# Patient Record
Sex: Female | Born: 1935 | Race: White | Hispanic: No | Smoking: Former smoker
Health system: Southern US, Community
[De-identification: ages and names within clinical notes are randomized; demographics above are authoritative.]

## PROBLEM LIST (undated history)

## (undated) DIAGNOSIS — W19XXXA Unspecified fall, initial encounter: Secondary | ICD-10-CM

## (undated) DIAGNOSIS — I10 Essential (primary) hypertension: Secondary | ICD-10-CM

## (undated) DIAGNOSIS — F329 Major depressive disorder, single episode, unspecified: Secondary | ICD-10-CM

## (undated) DIAGNOSIS — Z9229 Personal history of other drug therapy: Secondary | ICD-10-CM

## (undated) DIAGNOSIS — R42 Dizziness and giddiness: Secondary | ICD-10-CM

## (undated) DIAGNOSIS — I2510 Atherosclerotic heart disease of native coronary artery without angina pectoris: Secondary | ICD-10-CM

## (undated) DIAGNOSIS — S72011A Unspecified intracapsular fracture of right femur, initial encounter for closed fracture: Secondary | ICD-10-CM

## (undated) DIAGNOSIS — F419 Anxiety disorder, unspecified: Secondary | ICD-10-CM

## (undated) DIAGNOSIS — F32A Depression, unspecified: Secondary | ICD-10-CM

## (undated) DIAGNOSIS — M1990 Unspecified osteoarthritis, unspecified site: Secondary | ICD-10-CM

## (undated) DIAGNOSIS — I495 Sick sinus syndrome: Secondary | ICD-10-CM

## (undated) DIAGNOSIS — I4821 Permanent atrial fibrillation: Secondary | ICD-10-CM

## (undated) DIAGNOSIS — F0390 Unspecified dementia without behavioral disturbance: Secondary | ICD-10-CM

## (undated) DIAGNOSIS — K219 Gastro-esophageal reflux disease without esophagitis: Secondary | ICD-10-CM

## (undated) HISTORY — DX: Personal history of other drug therapy: Z92.29

## (undated) HISTORY — DX: Unspecified fall, initial encounter: W19.XXXA

## (undated) HISTORY — DX: Anxiety disorder, unspecified: F41.9

## (undated) HISTORY — PX: CHOLECYSTECTOMY: SHX55

## (undated) HISTORY — DX: Unspecified osteoarthritis, unspecified site: M19.90

## (undated) HISTORY — DX: Dizziness and giddiness: R42

## (undated) HISTORY — DX: Permanent atrial fibrillation: I48.21

## (undated) HISTORY — DX: Gastro-esophageal reflux disease without esophagitis: K21.9

## (undated) HISTORY — DX: Unspecified intracapsular fracture of right femur, initial encounter for closed fracture: S72.011A

## (undated) HISTORY — DX: Depression, unspecified: F32.A

## (undated) HISTORY — DX: Major depressive disorder, single episode, unspecified: F32.9

## (undated) HISTORY — PX: GASTRIC BYPASS: SHX52

## (undated) HISTORY — PX: PACEMAKER INSERTION: SHX728

## (undated) HISTORY — PX: BREAST REDUCTION SURGERY: SHX8

## (undated) HISTORY — DX: Atherosclerotic heart disease of native coronary artery without angina pectoris: I25.10

## (undated) HISTORY — PX: TOTAL KNEE ARTHROPLASTY: SHX125

## (undated) HISTORY — DX: Essential (primary) hypertension: I10

## (undated) HISTORY — DX: Sick sinus syndrome: I49.5

---

## 1998-02-10 ENCOUNTER — Other Ambulatory Visit: Admission: RE | Admit: 1998-02-10 | Discharge: 1998-02-10 | Payer: Self-pay | Admitting: Internal Medicine

## 1998-08-20 ENCOUNTER — Other Ambulatory Visit: Admission: RE | Admit: 1998-08-20 | Discharge: 1998-08-20 | Payer: Self-pay | Admitting: Internal Medicine

## 1999-04-22 ENCOUNTER — Other Ambulatory Visit: Admission: RE | Admit: 1999-04-22 | Discharge: 1999-04-22 | Payer: Self-pay | Admitting: Obstetrics and Gynecology

## 2000-09-06 ENCOUNTER — Encounter: Payer: Self-pay | Admitting: Internal Medicine

## 2000-09-06 ENCOUNTER — Encounter: Admission: RE | Admit: 2000-09-06 | Discharge: 2000-09-06 | Payer: Self-pay | Admitting: Internal Medicine

## 2001-12-26 ENCOUNTER — Encounter: Payer: Self-pay | Admitting: Internal Medicine

## 2001-12-26 ENCOUNTER — Encounter: Admission: RE | Admit: 2001-12-26 | Discharge: 2001-12-26 | Payer: Self-pay | Admitting: Internal Medicine

## 2004-08-18 ENCOUNTER — Encounter: Admission: RE | Admit: 2004-08-18 | Discharge: 2004-08-18 | Payer: Self-pay | Admitting: Internal Medicine

## 2005-01-12 ENCOUNTER — Encounter: Admission: RE | Admit: 2005-01-12 | Discharge: 2005-01-12 | Payer: Self-pay | Admitting: Internal Medicine

## 2005-02-23 ENCOUNTER — Ambulatory Visit (HOSPITAL_COMMUNITY): Admission: RE | Admit: 2005-02-23 | Discharge: 2005-02-23 | Payer: Self-pay | Admitting: General Surgery

## 2005-04-19 ENCOUNTER — Ambulatory Visit (HOSPITAL_COMMUNITY): Admission: RE | Admit: 2005-04-19 | Discharge: 2005-04-20 | Payer: Self-pay | Admitting: General Surgery

## 2005-04-19 ENCOUNTER — Encounter (INDEPENDENT_AMBULATORY_CARE_PROVIDER_SITE_OTHER): Payer: Self-pay | Admitting: *Deleted

## 2005-11-09 ENCOUNTER — Encounter: Admission: RE | Admit: 2005-11-09 | Discharge: 2005-11-09 | Payer: Self-pay | Admitting: Internal Medicine

## 2007-01-12 HISTORY — PX: US ECHOCARDIOGRAPHY: HXRAD669

## 2007-02-15 ENCOUNTER — Inpatient Hospital Stay (HOSPITAL_COMMUNITY): Admission: AD | Admit: 2007-02-15 | Discharge: 2007-02-16 | Payer: Self-pay | Admitting: Cardiology

## 2007-02-15 HISTORY — PX: CARDIOVASCULAR STRESS TEST: SHX262

## 2007-04-27 ENCOUNTER — Ambulatory Visit (HOSPITAL_COMMUNITY): Admission: RE | Admit: 2007-04-27 | Discharge: 2007-04-27 | Payer: Self-pay | Admitting: Cardiology

## 2007-04-30 ENCOUNTER — Inpatient Hospital Stay (HOSPITAL_COMMUNITY): Admission: AD | Admit: 2007-04-30 | Discharge: 2007-05-02 | Payer: Self-pay | Admitting: Cardiology

## 2007-05-10 ENCOUNTER — Encounter: Admission: RE | Admit: 2007-05-10 | Discharge: 2007-05-10 | Payer: Self-pay | Admitting: Cardiology

## 2007-07-18 ENCOUNTER — Encounter: Admission: RE | Admit: 2007-07-18 | Discharge: 2007-07-18 | Payer: Self-pay | Admitting: Internal Medicine

## 2007-07-20 ENCOUNTER — Ambulatory Visit (HOSPITAL_COMMUNITY): Admission: RE | Admit: 2007-07-20 | Discharge: 2007-07-20 | Payer: Self-pay | Admitting: Cardiology

## 2007-08-03 ENCOUNTER — Encounter (INDEPENDENT_AMBULATORY_CARE_PROVIDER_SITE_OTHER): Payer: Self-pay | Admitting: Cardiology

## 2007-08-03 ENCOUNTER — Ambulatory Visit (HOSPITAL_COMMUNITY): Admission: RE | Admit: 2007-08-03 | Discharge: 2007-08-03 | Payer: Self-pay | Admitting: Cardiology

## 2007-08-03 ENCOUNTER — Ambulatory Visit: Payer: Self-pay | Admitting: Vascular Surgery

## 2007-08-10 HISTORY — PX: US ECHOCARDIOGRAPHY: HXRAD669

## 2008-01-10 ENCOUNTER — Ambulatory Visit: Admission: RE | Admit: 2008-01-10 | Discharge: 2008-01-10 | Payer: Self-pay | Admitting: Cardiology

## 2008-01-24 ENCOUNTER — Ambulatory Visit (HOSPITAL_COMMUNITY): Admission: RE | Admit: 2008-01-24 | Discharge: 2008-01-25 | Payer: Self-pay | Admitting: *Deleted

## 2008-01-24 HISTORY — PX: INSERT / REPLACE / REMOVE PACEMAKER: SUR710

## 2009-04-06 ENCOUNTER — Inpatient Hospital Stay (HOSPITAL_COMMUNITY): Admission: RE | Admit: 2009-04-06 | Discharge: 2009-04-10 | Payer: Self-pay | Admitting: Orthopedic Surgery

## 2010-07-01 ENCOUNTER — Ambulatory Visit: Payer: Self-pay | Admitting: Cardiology

## 2010-07-29 ENCOUNTER — Ambulatory Visit: Payer: Self-pay | Admitting: Cardiology

## 2010-08-13 ENCOUNTER — Ambulatory Visit: Payer: Self-pay | Admitting: Cardiology

## 2010-08-28 ENCOUNTER — Encounter: Payer: Self-pay | Admitting: Internal Medicine

## 2010-09-08 ENCOUNTER — Ambulatory Visit: Payer: Self-pay | Admitting: Internal Medicine

## 2010-09-10 ENCOUNTER — Ambulatory Visit: Payer: Self-pay | Admitting: Cardiology

## 2010-10-15 ENCOUNTER — Ambulatory Visit: Payer: Self-pay | Admitting: Cardiology

## 2010-10-15 ENCOUNTER — Ambulatory Visit: Payer: Self-pay | Admitting: Cardiovascular Disease

## 2010-11-04 ENCOUNTER — Ambulatory Visit: Payer: Self-pay | Admitting: Cardiology

## 2010-12-06 ENCOUNTER — Ambulatory Visit: Payer: Self-pay | Admitting: Cardiology

## 2010-12-09 ENCOUNTER — Ambulatory Visit: Admit: 2010-12-09 | Payer: Self-pay | Admitting: Internal Medicine

## 2010-12-20 ENCOUNTER — Ambulatory Visit: Payer: Self-pay | Admitting: Cardiology

## 2010-12-23 NOTE — Procedures (Signed)
Summary: pacer check/medtronic   Current Medications (verified): 1)  Klor-Con 10 10 Meq Cr-Tabs (Potassium Chloride) .... One By Mouth Daily 2)  Atenolol 50 Mg Tabs (Atenolol) .... One By Mouth Daily 3)  Triamterene-Hctz 37.5-25 Mg Tabs (Triamterene-Hctz) .... One By Mouth Daily 4)  Omeprazole 20 Mg Cpdr (Omeprazole) .... Two By Mouth Daily 5)  Amiodarone Hcl 200 Mg Tabs (Amiodarone Hcl) .... One By Mouth Daily 6)  Coumadin 2.5 Mg Tabs (Warfarin Sodium) .... As Directed 7)  Alendronate Sodium 70 Mg Tabs (Alendronate Sodium) .... One By Mouth Weekly 8)  Tylenol Arthritis Pain 650 Mg Cr-Tabs (Acetaminophen) .... As Needed 9)  Citracal Plus  Tabs (Multiple Minerals-Vitamins) .... Two By Mouth Daily 10)  Ra Col-Rite 100 Mg Caps (Docusate Sodium) .... One By Mouth Two Times A Day 11)  Vitamin D3 5000 Unit Tabs (Cholecalciferol) .... One By Mouth Monthly 12)  Xalatan 0.005 % Soln (Latanoprost) .Marland Kitchen.. 1 Drop Each Eye Daily 13)  Optivar 0.05 % Soln (Azelastine Hcl) .Marland Kitchen.. 1 Drop Each Eye Two Times A Day  Allergies (verified): 1)  ! Codeine 2)  ! Iodine  PPM Specifications Following MD:  Hillis Range, MD     Referring MD:  Peter [place]azil[place], MD PPM Vendor:  Medtronic     PPM Model Number:  [id number]     PPM Serial Number:  [id number] PPM DOI:  01/24/2008     PPM Implanting MD:  Charlynn Court  Lead 1    Location: RA     DOI: 01/24/2008     Model #: [id number]     Serial #: [id number]     Status: active Lead 2    Location: RV     DOI: 01/24/2008     Model #: [id number]     Serial #: [id number]     Status: active  Magnet Response Rate:  BOL 85 ERI 65  Indications:  A-fib   PPM Follow Up Remote Check?  No Battery Voltage:  3.02 V     Pacer Dependent:  No       PPM Device Measurements Atrium  Amplitude: 1.8 mV, Impedance: 384 ohms, Threshold: 0.5 V at 0.4 msec Right Ventricle  Amplitude: 4.9 mV, Impedance: 440 ohms, Threshold: 1.0 V at 0.4 msec  Episodes MS Episodes:  9     Percent Mode Switch:  5.6%      Coumadin:  Yes Atrial Pacing:  83%     Ventricular Pacing:  1.7%  Parameters Mode:  DDDR+     Lower Rate Limit:  60     Upper Rate Limit:  130 Paced AV Delay:  180     Sensed AV Delay:  150 Next Cardiology Appt Due:  11/21/2010 Tech Comments:  No parameter changes.  Device function normal.  ROV 3 months with Dr. Johney Frame. Altha Harm, LPN  September 08, 2010 4:31 PM

## 2010-12-23 NOTE — Cardiovascular Report (Signed)
Summary: Office Visit   Office Visit   Imported By: Roderic Ovens 09/14/2010 10:39:21  _____________________________________________________________________  External Attachment:    Type:   Image     Comment:   External Document

## 2010-12-23 NOTE — Miscellaneous (Signed)
Summary: Device preload  Clinical Lists Changes  Observations: Added new observation of PPM INDICATN: A-fib (08/28/2010 12:44) Added new observation of MAGNET RTE: BOL 85 ERI 65 (08/28/2010 12:44) Added new observation of PPMLEADSTAT2: active (08/28/2010 12:44) Added new observation of PPMLEADSER2: [id number] (08/28/2010 12:44) Added new observation of PPMLEADMOD2: 5076  (08/28/2010 12:44) Added new observation of PPMLEADDOI2: 01/24/2008  (08/28/2010 12:44) Added new observation of PPMLEADLOC2: RV  (08/28/2010 12:44) Added new observation of PPMLEADSTAT1: active  (08/28/2010 12:44) Added new observation of PPMLEADSER1: [id number]  (08/28/2010 12:44) Added new observation of PPMLEADMOD1: 5076  (08/28/2010 12:44) Added new observation of PPMLEADDOI1: 01/24/2008  (08/28/2010 12:44) Added new observation of PPMLEADLOC1: RA  (08/28/2010 12:44) Added new observation of PPM IMP MD: Charlynn Court  (08/28/2010 12:44) Added new observation of PPM DOI: 01/24/2008  (08/28/2010 12:44) Added new observation of PPM SERL#: [id number]  (08/28/2010 12:44) Added new observation of PPM MODL#: [id number]  (08/28/2010 12:44) Added new observation of PACEMAKERMFG: Medtronic  (08/28/2010 12:44) Added new observation of PPM REFER MD: Peter Swaziland, MD  (08/28/2010 12:44) Added new observation of PACEMAKER MD: Hillis Range, MD  (08/28/2010 12:44)      PPM Specifications Following MD:  Hillis Range, MD     Referring MD:  Peter [place]azil[place], MD PPM Vendor:  Medtronic     PPM Model Number:  [id number]     PPM Serial Number:  [id number] PPM DOI:  01/24/2008     PPM Implanting MD:  Charlynn Court  Lead 1    Location: RA     DOI: 01/24/2008     Model #: [id number]     Serial #: [id number]     Status: active Lead 2    Location: RV     DOI: 01/24/2008     Model #: [id number]     Serial #: [id number]     Status: active  Magnet Response Rate:  BOL 85 ERI 65  Indications:  A-fib

## 2011-01-12 ENCOUNTER — Other Ambulatory Visit (INDEPENDENT_AMBULATORY_CARE_PROVIDER_SITE_OTHER): Payer: Medicare Other

## 2011-01-12 DIAGNOSIS — Z7901 Long term (current) use of anticoagulants: Secondary | ICD-10-CM

## 2011-01-12 DIAGNOSIS — I4891 Unspecified atrial fibrillation: Secondary | ICD-10-CM

## 2011-02-02 ENCOUNTER — Encounter (INDEPENDENT_AMBULATORY_CARE_PROVIDER_SITE_OTHER): Payer: Medicare Other | Admitting: Internal Medicine

## 2011-02-02 ENCOUNTER — Encounter: Payer: Self-pay | Admitting: Internal Medicine

## 2011-02-02 DIAGNOSIS — I495 Sick sinus syndrome: Secondary | ICD-10-CM

## 2011-02-02 DIAGNOSIS — I4891 Unspecified atrial fibrillation: Secondary | ICD-10-CM

## 2011-02-02 DIAGNOSIS — I10 Essential (primary) hypertension: Secondary | ICD-10-CM | POA: Insufficient documentation

## 2011-02-08 NOTE — Assessment & Plan Note (Signed)
Summary: pc2/per pt call=mj   Visit Type:  Follow-up Referring Provider:  Dr [place]az[place]and Primary Provider:  Dr Wylene Simmer   History of Present Illness: [name] is a pleasant 77 yo WF with a h/o paroxysmal atrial fibrillation and tachy/ brady syndrome s/p PPM (MDT) by Dr Reyes Ivan 01/24/08 who presents today to establish care in the EP device clinic.  She reports doing very well since her pacemaker was implanted.  She remains active despite her age.  She reports occasional dizziness but denies symptoms of palpitations, chest pain, shortness of breath, orthopnea, PND, lower extremity edema,  presyncope, syncope, or neurologic sequela. The patient is tolerating medications without difficulties and is otherwise without complaint today.   Current Medications (verified): 1)  Klor-Con 10 10 Meq Cr-Tabs (Potassium Chloride) .... One By Mouth Daily 2)  Atenolol 50 Mg Tabs (Atenolol) .... One By Mouth Daily 3)  Triamterene-Hctz 37.5-25 Mg Tabs (Triamterene-Hctz) .... One By Mouth Daily 4)  Omeprazole 20 Mg Cpdr (Omeprazole) .... Two By Mouth Daily 5)  Amiodarone Hcl 200 Mg Tabs (Amiodarone Hcl) .... One By Mouth Daily 6)  Coumadin 2.5 Mg Tabs (Warfarin Sodium) .... As Directed 7)  Alendronate Sodium 70 Mg Tabs (Alendronate Sodium) .... One By Mouth Weekly 8)  Tylenol Arthritis Pain 650 Mg Cr-Tabs (Acetaminophen) .... As Needed 9)  Citracal Plus  Tabs (Multiple Minerals-Vitamins) .... Two By Mouth Daily 10)  Ra Col-Rite 100 Mg Caps (Docusate Sodium) .... One By Mouth Two Times A Day 11)  Vitamin D (Ergocalciferol) 50000 Unit Caps (Ergocalciferol) .... Monthly 12)  Xalatan 0.005 % Soln (Latanoprost) .Marland Kitchen.. 1 Drop Each Eye Daily 13)  Optivar 0.05 % Soln (Azelastine Hcl) .Marland Kitchen.. 1 Drop Each Eye Two Times A Day 14)  Vitamin D .... Weekly  Allergies: 1)  ! Codeine 2)  ! Iodine  Past History:  Past Medical History: Anxiety CAD Depression Hypertension Tachybrady Sydrome Anticoagulation Therapy Paroxysmal  atrial fibrillation Glaucoma Ostioarthritis Irritable Bowel Sydrome Childhood illnesses - measles, mumps Past DDDR pacemaker placement for tachy/brady syndrome G E R D Asthmatic bronchitis Diverticulosis with diverticulitis Chronic back pain Bilateral knee DJD DJD of the hands and feet  Past Surgical History: Total Knee Arthroplasty Dual-chamber permanent pacemaker implant  by Dr Reyes Ivan 2009 Laparoscopic cholecystectomy with intraoperative cholangiogram Breast Reduction in 1983 Left Bowel Resection for Diverticulitis in 1985 Bilateral Cataract Extraction  Family History: Reviewed history from 02/01/2011 and no changes required. Father deceased: age 10, MI.  Mother deceased: age 59,  renal failure She has 14 siblings, 3 of whom are living Family history of hypertension, diabetes mellitus, coronary artery disease in multiple siblings.  She had a brother that had colon cancer.  She has a  son with diabetes mellitus.  She had several siblings with heart disease in their 13s.  Social History: Reviewed history from 02/01/2011 and no changes required. Widow with 2 adult children and 4 stepchildren.  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 Past smoker, quit about 40 years ago, no alcohol.  She does want to look into a skilled facility, is interested  in [facility].  She does have a ramp entering into her home.  Review of Systems       All systems are reviewed and negative except as listed in the HPI.   Vital Signs:  Patient profile:   77 year old female Height:      65 inches Weight:      192 pounds BMI:  32.07 Pulse rate:   64 / minute BP sitting:   122 / 70  (left arm)  Vitals Entered By: Laurance Flatten CMA (February 02, 2011 10:44 AM)  Physical Exam  General:  obese, NAD Head:  normocephalic and atraumatic Eyes:  PERRLA/EOM intact; conjunctiva and lids normal. Mouth:  Teeth, gums and palate normal. Oral mucosa normal. Neck:   supple Chest Wall:  L sided PPM is well healed Lungs:  Clear bilaterally to auscultation and percussion. Heart:  RRR, no m/r/g Abdomen:  Bowel sounds positive; abdomen soft and non-tender without masses, organomegaly, or hernias noted. No hepatosplenomegaly. Msk:  Back normal, normal gait. Muscle strength and tone normal. Extremities:  No clubbing or cyanosis. Neurologic:  Alert and oriented x 3. Skin:  Intact without lesions or rashes. Psych:  Normal affect.   PPM Specifications Following MD:  Hillis Range, MD     Referring MD:  Peter Swazil[place], MD PPM Vendor:  Medtronic     PPM Model Number:  [id number]     PPM Serial Number:  [id number] PPM DOI:  01/24/2008     PPM Implanting MD:  Charlynn Court  Lead 1    Location: RA     DOI: 01/24/2008     Model #: 5076     Serial #: [id number]     Status: active Lead 2    Location: RV     DOI: 01/24/2008     Model #: 8295     Serial #: [id number]     Status: active  Magnet Response Rate:  BOL 85 ERI 65  Indications:  A-fib   PPM Follow Up Pacer Dependent:  No      Episodes Coumadin:  Yes  Parameters Mode:  DDDR+     Lower Rate Limit:  60     Upper Rate Limit:  130 Paced AV Delay:  180     Sensed AV Delay:  150 MD Comments:  see scanned report in paceart  Impression & Recommendations:  Problem # 1:  BRADYCARDIA-TACHYCARDIA SYNDROME (ICD-427.81) normal pacemaker function no changes today see scanned report in paceart  Problem # 2:  ATRIAL FIBRILLATION (ICD-427.31) maintaining sinus rhythm with amiodarone Dr [place] to follow LFTs/TFTs continue coumadin (goal INR 2-3)  Problem # 3:  ESSENTIAL HYPERTENSION, BENIGN (ICD-401.1) stable no changes today  Patient Instructions: 1)  return to device clinic in 6 months

## 2011-02-09 ENCOUNTER — Other Ambulatory Visit: Payer: Medicare Other

## 2011-02-10 ENCOUNTER — Emergency Department (HOSPITAL_COMMUNITY): Payer: Medicare Other

## 2011-02-10 ENCOUNTER — Ambulatory Visit (INDEPENDENT_AMBULATORY_CARE_PROVIDER_SITE_OTHER): Payer: Medicare Other | Admitting: *Deleted

## 2011-02-10 ENCOUNTER — Emergency Department (HOSPITAL_COMMUNITY)
Admission: EM | Admit: 2011-02-10 | Discharge: 2011-02-10 | Disposition: A | Discharge status: Home / Self Care | Payer: Medicare Other | Attending: Emergency Medicine | Admitting: Emergency Medicine

## 2011-02-10 DIAGNOSIS — M25569 Pain in unspecified knee: Secondary | ICD-10-CM | POA: Insufficient documentation

## 2011-02-10 DIAGNOSIS — M5430 Sciatica, unspecified side: Secondary | ICD-10-CM | POA: Insufficient documentation

## 2011-02-10 DIAGNOSIS — I10 Essential (primary) hypertension: Secondary | ICD-10-CM | POA: Insufficient documentation

## 2011-02-10 DIAGNOSIS — Z950 Presence of cardiac pacemaker: Secondary | ICD-10-CM | POA: Insufficient documentation

## 2011-02-10 DIAGNOSIS — I4891 Unspecified atrial fibrillation: Secondary | ICD-10-CM

## 2011-02-10 DIAGNOSIS — Z7901 Long term (current) use of anticoagulants: Secondary | ICD-10-CM

## 2011-02-17 NOTE — Cardiovascular Report (Signed)
Summary: Office Visit   Office Visit   Imported By: Roderic Ovens 02/09/2011 16:22:08  _____________________________________________________________________  External Attachment:    Type:   Image     Comment:   External Document

## 2011-03-01 LAB — TYPE AND SCREEN
ABO/RH(D): O POS
Antibody Screen: NEGATIVE

## 2011-03-01 LAB — COMPREHENSIVE METABOLIC PANEL
Albumin: 3.8 g/dL (ref 3.5–5.2)
Alkaline Phosphatase: 63 U/L (ref 39–117)
BUN: 16 mg/dL (ref 6–23)
CO2: 29 mEq/L (ref 19–32)
Chloride: 107 mEq/L (ref 96–112)
Potassium: 4 mEq/L (ref 3.5–5.1)
Total Bilirubin: 0.7 mg/dL (ref 0.3–1.2)

## 2011-03-01 LAB — CBC
HCT: 32.5 % — ABNORMAL LOW (ref 36.0–46.0)
HCT: 41.8 % (ref 36.0–46.0)
Hemoglobin: 10.1 g/dL — ABNORMAL LOW (ref 12.0–15.0)
Hemoglobin: 9.9 g/dL — ABNORMAL LOW (ref 12.0–15.0)
Hemoglobin: 14.5 g/dL (ref 12.0–15.0)
MCHC: 34.6 g/dL (ref 30.0–36.0)
MCV: 95.9 fL (ref 78.0–100.0)
Platelets: 191 10*3/uL (ref 150–400)
Platelets: 240 10*3/uL (ref 150–400)
RBC: 2.97 MIL/uL — ABNORMAL LOW (ref 3.87–5.11)
RBC: 3.1 MIL/uL — ABNORMAL LOW (ref 3.87–5.11)
RBC: 4.37 MIL/uL (ref 3.87–5.11)
WBC: 5 10*3/uL (ref 4.0–10.5)

## 2011-03-01 LAB — URINALYSIS, ROUTINE W REFLEX MICROSCOPIC
Glucose, UA: NEGATIVE mg/dL
Hgb urine dipstick: NEGATIVE
Ketones, ur: NEGATIVE mg/dL
pH: 6 (ref 5.0–8.0)

## 2011-03-01 LAB — BASIC METABOLIC PANEL
BUN: 10 mg/dL (ref 6–23)
BUN: 11 mg/dL (ref 6–23)
BUN: 8 mg/dL (ref 6–23)
CO2: 26 mEq/L (ref 19–32)
CO2: 29 mEq/L (ref 19–32)
Calcium: 8.4 mg/dL (ref 8.4–10.5)
Chloride: 104 mEq/L (ref 96–112)
Chloride: 106 mEq/L (ref 96–112)
Creatinine, Ser: 0.89 mg/dL (ref 0.4–1.2)
Creatinine, Ser: 0.95 mg/dL (ref 0.4–1.2)
GFR calc Af Amer: >60 mL/min (ref >60)
GFR calc Af Amer: >60 mL/min (ref >60)
GFR calc non Af Amer: 57 mL/min — ABNORMAL LOW (ref >60)
GFR calc non Af Amer: 60 mL/min — ABNORMAL LOW (ref >60)
Glucose, Bld: 93 mg/dL (ref 70–99)
Potassium: 3.5 mEq/L (ref 3.5–5.1)
Potassium: 3.9 mEq/L (ref 3.5–5.1)
Potassium: 4.1 mEq/L (ref 3.5–5.1)
Sodium: 134 mEq/L — ABNORMAL LOW (ref 135–145)
Sodium: 135 mEq/L (ref 135–145)
Sodium: 138 mEq/L (ref 135–145)

## 2011-03-01 LAB — PROTIME-INR
INR: 1.2 (ref 0.00–1.49)
INR: 2.4 — ABNORMAL HIGH (ref 0.00–1.49)
INR: 2.6 — ABNORMAL HIGH (ref 0.00–1.49)
Prothrombin Time: 14.8 seconds (ref 11.6–15.2)

## 2011-03-01 LAB — URINE MICROSCOPIC-ADD ON

## 2011-03-10 ENCOUNTER — Encounter: Payer: Medicare Other | Admitting: *Deleted

## 2011-03-11 ENCOUNTER — Ambulatory Visit (INDEPENDENT_AMBULATORY_CARE_PROVIDER_SITE_OTHER): Payer: Medicare Other | Admitting: *Deleted

## 2011-03-11 DIAGNOSIS — Z7901 Long term (current) use of anticoagulants: Secondary | ICD-10-CM

## 2011-03-11 DIAGNOSIS — I4891 Unspecified atrial fibrillation: Secondary | ICD-10-CM

## 2011-04-01 ENCOUNTER — Ambulatory Visit (INDEPENDENT_AMBULATORY_CARE_PROVIDER_SITE_OTHER): Payer: Medicare Other | Admitting: *Deleted

## 2011-04-01 DIAGNOSIS — I4891 Unspecified atrial fibrillation: Secondary | ICD-10-CM

## 2011-04-01 DIAGNOSIS — Z7901 Long term (current) use of anticoagulants: Secondary | ICD-10-CM

## 2011-04-05 NOTE — Discharge Summary (Signed)
NAME:  Alison Dodson, SORENSON               ACCOUNT NO.:  [id number]   MEDICAL RECORD NO.:  [id number]          PATIENT TYPE:  OIB   LOCATION:  4735                         FACILITY:  [facility]   PHYSICIAN:  Elmore Guise., M.D.DATE OF BIRTH:  Mar 31, 1928   DATE OF ADMISSION:  01/24/2008  DATE OF DISCHARGE:  01/25/2008                               DISCHARGE SUMMARY   DISCHARGE DIAGNOSES:  1. History of sick sinus syndrome/tachycardia-bradycardia.  2. Status post dual-chamber permanent pacemaker implant.  3. Hypertension.  4. Gastroesophageal reflux disease.   HISTORY OF PRESENT ILLNESS:  [name] is a very pleasant, 77 year old,  white female with past medical history of paroxysmal atrial  fibrillation, hypertension, gastroesophageal reflux disease and  degenerative joint disease who presented for evaluation of shortness of  breath and dizzy spells.  She wore an event monitor which showed  evidence of sick sinus syndrome with episodes of heart rates getting  down into low 30s with short, nonsustained episodes of atrial  tachycardia/atrial fibrillation.  Because of her symptoms, she was  referred for permanent pacemaker implant.   HOSPITAL COURSE:  The patient underwent pacemaker implant on January 24, 2008.  She tolerated procedure well.  Her postprocedure evaluation was  unremarkable.  Her pacer interrogation, the following morning, showed  good thresholds with threshold of 0.5 V at 0.4 msec in both the atrial  and ventricular chambers.  Her R-waves did fall from implant from 11 mV  to 6 mV.  Her P-waves remained stable.  Her chest x-ray was still  pending at time of dictation.  She has remained hemodynamically stable  and in normal sinus rhythm while in the hospital.  Post pacemaker  restrictions and instructions were discussed with her at length.  A post  pacemaker instruction sheet was given to the patient.   DISCHARGE MEDICATIONS:  1. Amiodarone 100 mg daily.  2. Dyazide 37.5/25  mg once daily.  3. Wellbutrin XL 300 mg daily.  4. Potassium 10 mEq daily.  5. Colace 100 mg daily.  6. Omeprazole 20 mg daily.  7. Fosamax 70 mg weekly.  8. Vitamin D 1.25 mg monthly.  9. Calcium twice daily.  10.Tylenol as needed.  11.Xalatan eye drops once daily.  12.Keflex 500 mg three times daily for the next 5 days.  This was      given because of the patient's allergy to Betadine as well as the      length of her case during implant.   FOLLOW UP:  Her followup appointment will be with Dr. Reyes Ivan at  [facility] in 1 week for wound check.  She has to call the  office if she has any problems or concerns.  I did discuss she may  restart her Coumadin back on Sunday if she has no bleeding from her  site.  All her questions were answered.      Elmore Guise., M.D.  Electronically Signed     TWK/MEDQ  D:  01/25/2008  T:  01/26/2008  Job:  [id number]

## 2011-04-05 NOTE — Consult Note (Signed)
NAME:  Alison Dodson, Alison Dodson               ACCOUNT NO.:  [id number]   MEDICAL RECORD NO.:  [id number]          PATIENT TYPE:  OIB   LOCATION:  2854                         FACILITY:  [facility]   PHYSICIAN:  Armanda Magic, M.D.     DATE OF BIRTH:  19-Nov-1928   DATE OF CONSULTATION:  DATE OF DISCHARGE:                                 CONSULTATION   PROCEDURE:  Direct current cardioversion.   OPERATOR:  Armanda Magic, M.D.   INDICATIONS:  Atrial fibrillation.   COMPLICATIONS:  None.   IV MEDICATIONS:  100 mg of Diprivan.   This a 77 year old female with a history of new onset A-fib of unknown  duration, asthmatic bronchitis, normal LV function and no inducible  ischemia by Cardiolite, who presented with a-fib.  She has had a  therapeutic INR for several months now and presents for Cardioversion.   The patient was brought to the hospital in a fasting nonsedated state.  Informed consent was obtained.  The patient was connected to continuous  heart rate, pulse oximetry monitoring and blood pressure monitor.  After  adequate anesthesia was obtained, a synchronized 100-joule biphasic  shock was delivered through defibrillator pads on the anterior and  posterior left chest.  This was unsuccessful in converting the patient  to sinus rhythm.  A 150-joule synchronized biphasic shock was delivered  again, with unsuccessful in converting the patient to sinus rhythm.  A  200-joule synchronized biphasic shock was delivered which then converted  the patient to atrial flutter with slow ventricular response.  The  patient tolerated procedure well, with no complications.   ASSESSMENT:  1. A-fib with controlled ventricular response.  2. Systemic anticoagulation with therapeutic INR.  3. Unsuccessful cardioversion to normal sinus rhythm.  The patient now      in atrial flutter with variable rate but controlled ventricular      response.   PLAN:  Discharge to home after fully awake.  Will plan outpatient  admission for drug-eluting with amiodarone and continue current  medications at this time.     Armanda Magic, M.D.  Electronically Signed    TT/MEDQ  D:  04/27/2007  T:  04/27/2007  Job:  [id number]   cc:   Thora Lance, M.D.

## 2011-04-05 NOTE — H&P (Signed)
NAME:  Alison Dodson, ACKROYD               ACCOUNT NO.:  [id number]   MEDICAL RECORD NO.:  [id number]          PATIENT TYPE:  INP   LOCATION:  3742                         FACILITY:  [facility]   PHYSICIAN:  Armanda Magic, M.D.     DATE OF BIRTH:  1928-05-25   DATE OF ADMISSION:  04/30/2007  DATE OF DISCHARGE:                              HISTORY & PHYSICAL   PRIMARY CARE PHYSICIAN:  Thora Lance, M.D.   CARDIOLOGIST:  Armanda Magic, M.D.   REASON FOR EVALUATION:  Atrial fibrillation.   HISTORY OF PRESENT ILLNESS:  Alison Dodson is a 77 year old female with no  known history of coronary artery disease.  She has a history of  paroxysmal atrial fibrillation status post DCCV with unsuccessful  conversion to sinus rhythm on April 27, 2007.  She converted to atrial  flutter.  She is currently on systemic anticoagulation with Coumadin  therapy and is being directly admitted to the [facility] for  amiodarone loading.  Today, she admits to palpitations; however, denies  chest pain, shortness of breath, dizziness, presyncope, syncope,  orthopnea, PND, or lower extremity edema.   PAST MEDICAL HISTORY:  1. Paroxysmal atrial fibrillation status post DCCV with unsuccessful      conversion to sinus rhythm on April 27, 2007.  2. Hypertension.  3. Systemic anticoagulation with Coumadin therapy.  4. GERD.  5. Depression.  6. Chronic back pain.  7. Bilateral knee DJD.  8. DJD of hands and feet.  9. Asthmatic bronchitis.  10.Diverticulosis with diverticulitis.  11.Left retinal tear.  12.History of chest pain was.  MI ruled out with negative serial      enzymes.  13.History of hypokalemia, repleted.   ALLERGIES:  1. IODINE.  2. CODEINE - rash.   MEDICATIONS:  1. Coumadin as directed.  2. Atenolol 50 mg daily.  3. Omeprazole 20 mg 1-2 tablets daily.  4. Wellbutrin XL 300 mg daily.  5. Multivitamin 1 tablet daily.  6. Calcium plus vitamin D daily.  7. Triamterene/HCTZ 37.5/25 mg daily.  8.  Fosamax 70 mg daily.  9. Klor-Con 10 mEq daily.   PAST SURGICAL HISTORY:  1. Status post breast reduction in 1983.  2. Status post left bowel resection for diverticulitis in 1985.  3. Status post left retinal tear surgery 5 years ago by Dr. Ashley Royalty.  4. Status post bilateral cataract extraction.  5. Status post laparoscopic cholecystectomy in 2006.   FAMILY HISTORY:  Father deceased, age 61, MI.  Mother deceased, age 70,  renal failure.  She has 14 siblings, 3 of whom are living.  Family  history of hypertension, diabetes mellitus, coronary artery disease in  multiple siblings.  She had a brother that had colon cancer.  She has a  son with diabetes mellitus.  She had several siblings with heart disease  in their 49s.   SOCIAL HISTORY:  Widow with 2 adult children and 4 stepchildren.  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  She  denies tobacco or illicit drug use.  She admits to occasional alcohol  use.  REVIEW OF SYSTEMS:  All other systems are negative other than what is  stated in the HPI.   PHYSICAL EXAMINATION:  GENERAL:  A 77 year old female, pleasant and  cooperative, NAD.  VITALS:  Pending.  HEENT:  Unremarkable.  NECK:  Supple without JVD or bilateral carotid bruits.  Carotid  upstrokes 2+.  PULMONARY:  Breath sounds are equal and clear to auscultation  bilaterally.  No use of accessory muscles.  CV:  Irregularly irregular.  Normal S1 and S2 without murmurs, gallops,  clicks or rubs.  ABDOMEN:  Benign.  EXTREMITIES:  No peripheral edema, cyanosis, or clubbing.  DP pulses  2+/2 bilaterally.  SKIN:  Warm and dry without rashes or lesions.  NEUROLOGIC:  No focal motor or sensory deficits.  PSYCHIATRIC:  Normal mood and affect.   LABORATORY DATA:  Pending.   ASSESSMENT:  1. Paroxysmal atrial fibrillation status post DCCV with unsuccessful      conversion to sinus rhythm on April 27, 2007.  2. Systemic anticoagulation with Coumadin  therapy.  3. Hypertension,  4. Gastroesophageal reflux disease.  5. Otherwise, as stated in the past medical history.   PLAN:  1. Stat EKG, then daily EKG starting May 01, 2007.  2. Start amiodarone 400 mg twice daily.  3. BMET, CBC, PT, INR, TSH, PA and lateral chest x-ray, and PFTs with      the DLCL.  4. Coumadin 5 mg daily except for 2.5 mg on Monday, Wednesday and      Friday; otherwise, further management and PT and INR checks per      pharmacy protocol.  5. Continue home medications.  6. The patient was seen, interviewed, and examined by Dr. Mayford Knife who      participated in the medical decision making and plan of care.      Tylene Fantasia, [place]      Armanda Magic, M.D.  Electronically Signed    RDM/MEDQ  D:  04/30/2007  T:  04/30/2007  Job:  [id number]   cc:   Thora Lance, M.D.

## 2011-04-05 NOTE — Op Note (Signed)
NAME:  Alison Dodson, Alison Dodson               ACCOUNT NO.:  [id number]   MEDICAL RECORD NO.:  [id number]          PATIENT TYPE:  OIB   LOCATION:  2807                         FACILITY:  [facility]   PHYSICIAN:  Elmore Guise., M.D.DATE OF BIRTH:  Mar 24, 1928   DATE OF PROCEDURE:  01/24/2008  DATE OF DISCHARGE:                               OPERATIVE REPORT   PROCEDURE:  Dual-chamber permanent pacemaker implant.   INDICATIONS FOR PROCEDURE:  Sick sinus syndrome.  Patient with  paroxysmal atrial fibrillation and episodes of significant sinus  bradycardia with heart rates in the low 30s.  The patient is  symptomatic.  The patient now referred for permanent pacemaker implant.   DESCRIPTION OF PROCEDURE:  The patient was brought to the cardiac cath  lab.  After appropriate informed consent, she was prepped and draped in  sterile fashion.  Approximately 40 mL of 1% lidocaine was used for local  anesthesia.  A 2 inch incision was made at the left deltopectoral  groove.  A subcutaneous pocket was then made with blunt and Bovie  dissection.  The left axillary vein was accessed under two separate  sticks with fluoroscopic guidance.  Two 7-French safety sheaths were  placed over the retained wire.  The ventricular lead was then placed.  It is a Medtronic [id number] cm, serial number [id number] active fixation  lead.  The following measurements were obtained.  R-waves measured 11.8  mV, impedance 1200 ohms, threshold 0.3 volts at 0.5 milliseconds with a  current of 0.8 mA, 10-volt check was negative.  The atrial lead was then  placed with good current of injury.  P-waves measured 6.0 mV, impedance  620 ohms, threshold of 1 volt at 0.5 milliseconds with a current of 2 mA  and 10-volt check was negative.  The ventricular and atrial lead were  then sewn into the pocket.  Pocket was copiously irrigated with  kanamycin solution.  A recheck was made and showed dislodgement of the  atrial lead.  The atrial lead was  then replaced near the lateral wall  showing good current of injury with P-waves measuring 6.8 mV, impedance  732 ohms, capture 1.5 volts at 0.5 milliseconds with a current of 2.6  mA, 10-volt check again was negative.  The pocket was irrigated with  kanamycin solution.  Hemostasis was obtained.  The atrial and  ventricular leads were then identified and placed in the appropriate  portion on the header of a Medtronic EnRhythm, P150DR, serial number  [id number] H generator.  The generator was sewn into the pocket.  The  wound was closed with three continuous layers of 2-0, followed by 2-0,  followed by 4-0 Vicryl suture.  Wound was cleaned.  Steri-Strips were  placed.  The patient remained hemodynamically stable throughout the  procedure.  The patient tolerated the procedure well and was transferred  from the cardiac cath lab in stable condition.      Elmore Guise., M.D.  Electronically Signed     TWK/MEDQ  D:  01/24/2008  T:  01/24/2008  Job:  [id number]   cc:   Demetria Pore.  [place], M.D.

## 2011-04-05 NOTE — Discharge Summary (Signed)
NAME:  Alison Dodson, Alison Dodson               ACCOUNT NO.:  [id number]   MEDICAL RECORD NO.:  [id number]          PATIENT TYPE:  INP   LOCATION:  3742                         FACILITY:  [facility]   PHYSICIAN:  Armanda Magic, M.D.     DATE OF BIRTH:  January 20, 1928   DATE OF ADMISSION:  04/30/2007  DATE OF DISCHARGE:  05/02/2007                               DISCHARGE SUMMARY   ADMISSION DIAGNOSIS:  Paroxysmal atrial fibrillation.   DISCHARGE DIAGNOSES:  1. Paroxysmal atrial fibrillation with amiodarone loading with stable      QTc.  2. Status post direct-current cardioversion was unsuccessful      conversion to sinus rhythm on April 27, 2007.  3. Hypertension.  4. Systemic anticoagulation with Coumadin therapy with a      supratherapeutic INR.  5. Gastroesophageal reflux disease.  6. Depression.  7. Chronic back pain.  8. Bilateral knee degenerative joint disease.  9. Degenerative joint disease of hands and feet.  10.Asthmatic bronchitis.  11.Diverticulosis with diverticulitis.  12.Left retinal tear.  13.History of chest pain.  Myocardial infarction ruled out with      negative serial enzymes.  14.History of hypokalemia, repleted.  15.Status post breast reduction in 1983.  16.Status post left bowel resection for diverticulitis in 1985.  17.Status post left retinal tear surgery 5 years ago by Dr. Molli Hazard.  18.Status post bilateral cataract extraction.  19.Status post laparoscopic cholecystectomy in 2006.   CONSULTATIONS:  None.   PROCEDURES:  None.   HOSPITAL COURSE:  Alison Dodson is a 77 year old female with no known  history of coronary artery disease.  She has a history of paroxysmal  atrial fibrillation, status post DCCV with unsuccessful conversion to  sinus rhythm on April 27, 2007.  She is on systemic anticoagulation with  Coumadin therapy and was directly admitted to the [facility] on  April 30, 2007 for amiodarone loading.  Initial EKG upon arrival revealed  atrial fibrillation  with a ventricular rate of 89 beats per minute with  a QTc of 452 milliseconds.  There were no acute ischemic changes noted.  TSH was within normal limits.  Chest x-ray revealed stable cardiomegaly,  no active lung disease, small hiatal hernia.  PFTs revealed minimal  obstructive airway disease -- peripheral airway, minimal neuromuscular  disease.  The drug was well tolerated by the patient and her QTc  remained stable during this admission.  A 12-lead EKG just prior to  discharge revealed atrial fibrillation with a ventricular rate of 90  beats per minute with a QTc of 477 milliseconds.  There were no acute  ischemic changes noted.  The patient was discharged to home in stable  condition without complaints of palpitations, chest pain, shortness of  breath, fatigue, dizziness, presyncope, or syncope.  She was seen and  examined by Dr. Carolanne Grumbling prior to discharge, who was in agreement  with the discharge decision.  She is being discharged to home on  amiodarone and was provided a prescription.  Her Coumadin is on hold  today secondary to a supratherapeutic INR at 3.3.  She has been  instructed  to restart Coumadin tomorrow at 2.5 mg with a followup  Coumadin Clinic appointment at [facility] on Friday morning.   LABORATORY DATA:  White blood count 6.3, hemoglobin 14.2, hematocrit  41.5, platelets 286,000.  PT 35.4, INR 3.3.  Sodium 138, potassium 3.7,  chloride 103, CO2 27, glucose 95, BUN 16, creatinine 1.06.  TSH 1.265.  PTT 37.   X-rays as stated in the hospital course.   EKGs as stated in the hospital course.   CONDITION ON DISCHARGE:  Stable.   DISCHARGE MEDICATIONS:  1. Amiodarone 200 mg two tablets twice daily for 1 week, then two      tablets daily for 1 week, and one tablet daily thereafter; a      prescription was given with refills.  2. Triamterene/hydrochlorothiazide 37.5/25 mg daily.  3. Wellbutrin XL 300 mg daily.  4. Coumadin 5 mg daily except 2.5 mg on  Monday, Wednesday and Friday.      Hold Coumadin today and restart tomorrow, May 03, 2007, at 2.5 mg      daily until seen in the Coumadin Clinic on June 13.  5. Klor-Con 10 mEq daily.  6. Atenolol 50 mg daily.  7. Colace 100 mg twice daily.  8. Omeprazole 20 mg twice daily.  9. Fosamax 70 mg weekly.  10.Vitamin D 1.25 mg weekly.  11.Calcium daily as prior to admission.  12.Tylenol 650 mg as needed.   DISCHARGE INSTRUCTIONS:  1. Continue a low-sodium, low-fat, and low-cholesterol diet.  2. Increase activity slowly.  3. Stop any activity that causes chest pain, shortness of breath,      dizziness, sweating, or excessive weakness.   FOLLOWUP ARRANGEMENTS:  1. [facility] Coumadin Clinic for PT and INR check, June 13 at 9      a.m.  2. EKG at [facility] for a QTc check on June 13 at 9:30 a.m.  3. EKG at [facility] for a QTc check on June 19 at 9 a.m.  4. Followup appointment with Dr. Carolanne Grumbling on June 30 at 2:45 p.m.      The patient is scheduled to call [phone] if she is unable to make      either of the scheduled appointments.      Tylene Fantasia, [place]      Armanda Magic, M.D.  Electronically Signed    RDM/MEDQ  D:  05/02/2007  T:  05/03/2007  Job:  [id number]   cc:   Thora Lance, M.D.

## 2011-04-05 NOTE — H&P (Signed)
NAME:  Alison Dodson, Alison Dodson               ACCOUNT NO.:  [id number]   MEDICAL RECORD NO.:  [id number]           PATIENT TYPE:   LOCATION:                                 FACILITY:   PHYSICIAN:  Elmore Guise., M.D.DATE OF BIRTH:  12/25/1927   DATE OF ADMISSION:  01/24/2008  DATE OF DISCHARGE:                              HISTORY & PHYSICAL   INDICATION FOR ADMISSION:  Tachybrady syndrome/sick sinus syndrome   HISTORY OF PRESENT ILLNESS:  Mr. Mullane is a very pleasant 77 year old  with past medical history of paroxysmal atrial fibrillation,  hypertension, gastroesophageal reflux disease, degenerative joint  disease, who presented for new patient evaluation with Dr. Swaziland on  January 03, 2008.  She had been complaining of problems with dyspnea  and dizzy spells.  She wore an  event monitor, which showed evidence of  sick sinus syndrome with episodes of heart rates getting down into the  low 30s.  She had some very short nonsustained runs of atrial  tachycardia.  Her spells were symptomatic.  She reported, I just get  very fatigued and feel like I'm going to pass out.  She had a spell  this weekend.  The PDS monitoring service notified the physician on  call.  At that time, she was told to hold her atenolol and her  amiodarone.  Otherwise, she denies any significant lower extremity  edema.  No orthopnea or PND.  No recent fever or cough.   REVIEW OF SYSTEMS:  As per HPI.  All others are negative.   CURRENT MEDICATIONS:  1. Amiodarone 100 mg daily (currently being held).  2. Dyazide 37.5/25 mg once daily.  3. Wellbutrin XL 300 mg daily.  4. Potassium 10 mEq daily.  5. Colace 100 mg daily.  6. Omeprazole 20 mg daily.  7. Fosamax 70 mg weekly.  8. Vitamin D 1.25 mg monthly.  9. Calcium twice daily.  10.Tylenol p.r.n.  11.Xalatan eye drops once daily.   ALLERGIES:  IODINE AND CODEINE.   FAMILY HISTORY:  Positive for heart disease with her father dying of a  heart attack at age   56.  Her mother died at age 76 from renal failure.   SOCIAL HISTORY:  She is retired.  She is currently widowed, has 2 grown  children.  She  quit smoking over 50 years ago and drinks a rare  alcoholic beverage.   PHYSICAL EXAMINATION:  VITAL SIGNS:  Her weight is 199 pounds.  Her  blood pressure is  140/72, heart rate is 68 and regular.  GENERAL:  She is a very pleasant white female, alert and oriented x 4,  in no acute distress.  HEENT:  Appeared normal.  NECK:  Supple.  No lymphadenopathy.  2+ carotids.  No JVD.  No bruits.  LUNGS:  Clear with good breath sounds to the bases.  HEART:  Regular with a 2/6 holosystolic murmur noted.  ABDOMEN:  Soft, nontender, nondistended.  EXTREMITIES:  Warm with no significant edema.   Her most recent blood work was reviewed.  This was done January 03, 2008.  It  showed a BNP level of 94.  She had a BUN and creatinine of 25  and 1.3, and a CBC showed a white blood cell count of 4.9, hemoglobin  14.9, and platelet count of 324.  Her last echo was done August 10, 2007, showing normal LV size and function with an EF of approximately  60%.  She did have pseudonormalization consistent with diastolic  dysfunction, mild right and left atrial enlargement with left atrial  size measured at 44 mm.  She had mild mitral and tricuspid regurgitation  with mild pulmonary hypertension with PA systolic pressure estimated at  46 mmHg.  Her last stress test was done February 15, 2007, which showed  normal perfusion with no evidence of inducible ischemia.   IMPRESSION:  1. Sick sinus syndrome/brady-tachy.  2. History of hypertension.  3. Gastroesophageal reflux disease.   PLAN:  I discussed permanent pacemaker implant with her at length.  She  will hold her Coumadin from today.  Her last INR was subtherapeutic.  This was done by her PCP.  I did ask her to have green leafy vegetables  to help keep her INR low.  She will have blood work done today.  Her   procedure will be scheduled for this Thursday.  She will hold her  amiodarone, atenolol, and Coumadin at this time.  She is to call if she  has any further problems.  I discussed the risks and benefits with her  at length.  She does wish to proceed.      Elmore Guise., M.D.  Electronically Signed     TWK/MEDQ  D:  01/21/2008  T:  01/21/2008  Job:  [id number]

## 2011-04-05 NOTE — Discharge Summary (Signed)
NAME:  Alison Dodson, Alison Dodson               ACCOUNT NO.:  [id number]   MEDICAL RECORD NO.:  [id number]          PATIENT TYPE:  INP   LOCATION:  1606                         [facility]ACILITY:  [facility]   PHYSICIAN:  Ollen Gross, M.D.    DATE OF BIRTH:  1928/01/13   DATE OF ADMISSION:  04/06/2009  DATE OF DISCHARGE:  04/10/2009                               DISCHARGE SUMMARY   ADMISSION DIAGNOSES:  1. Osteoarthritis, left knee greater than right knee.  2. Anxiety.  3. Depression.  4. Glaucoma.  5. Coronary arterial disease.  6. Tachybrady syndrome.  7. Past DDDR pacemaker placement.  8. Chronic coumadinization.  9. Hypertension.  10.Constipated type irritable bowel syndrome.  11.Osteoarthritis.  12.Childhood illnesses of measles, mumps.   DISCHARGE DIAGNOSES:  1. Osteoarthritis left knee, status post left total knee replacement      arthroplasty.  2. Osteoarthritis right knee.  3. Postoperative acute blood loss anemia, did not require transfusion.  4. Postoperative hyponatremia, improved.  5. Postoperative confusion, resolving.  6. Anxiety.  7. Depression.  8. Glaucoma.  9. Coronary arterial disease.  10.Tachybrady syndrome.  11.Past DDDR pacemaker placement.  12.Chronic coumadinization.  13.Hypertension.  14.Constipated type irritable bowel syndrome.  15.Osteoarthritis.  16.Childhood illnesses of measles, mumps.   PROCEDURE:  Apr 06, 2009, left total knee.   SURGEON:  Ollen Gross, M.D.   ASSISTANT:  Alexzandrew L. Perkins, P.A.C.   ANESTHESIA:  Performed under spinal anesthesia.   TOURNIQUET TIME:  Thirty minutes.   CONSULTS:  None.   BRIEF HISTORY:  Ms. Caridi is a 77 year old female with end-stage  arthritis of the left knee, progressive worsening pain and dysfunction,  failed operative management and now presents for total knee  arthroplasty.   LABORATORY DATA:  Preop CBC showed hemoglobin of 14.5, hematocrit 41.8,  white cell count 5.0, platelets  240, PT/INR preop  30 with INR 2.6 and PTT of 43 on chronic Coumadin.  It  was rechecked on the date of surgery.  INR was down to a normal level of  1.1.  Chem panel on admission all within normal limits.  Preop UA did  show small bilirubin, moderate leukocytes, few epithelials, 7-10 white  cells, few bacteria.  Serial CBCs were followed throughout the hospital  course.  Hemoglobin dropped down to a level of 11.1 and 10.1.  The last  known H and H stabilized at 9.9 with a hematocrit of 28.4.  Serial pro-  times followed per Coumadin protocol.  Last PT/INR 30.0 and 2.6.  Serial  BMETs were followed.  Sodium did drop from 144-134 back up to 138.  Remaining chem panel within normal limits.  Glucose did go up slightly  from 79-133 back down to normal level of 93.   DIAGNOSTICS:  1. Two-view chest January 25, 2008:  Stable, status post left permanent      pacemaker placement.  No left pneumothorax or effusion.  2. EKG Mar 30, 2008, normal sinus rhythm and low voltage QRS confirmed      by Dr. Dietrich Pates.   HOSPITAL COURSE:  The patient was admitted to [facility],  taken to OR, underwent the above stated procedure without complication.  The patient tolerated the procedure well and later transferred to the  recovery room on the orthopedic floor.  Started on PCA and p.o.  analgesic pain control following surgery.  The patient had a rough night  on the evening surgery due to pain, but doing a little bit better on  morning of day 1.  Increased pain after spinal wore off.  Her rate was  in the 70s.  She is placed on a Lovenox bridge due to her chronic  coumadinization and was placed back on her Coumadin.  Home meds were  restarted.  Started getting up out of bed and walking about 10-12 feet  on day 1 and about 40 feet by day 2.  We got social work involved  because she would need skilled nursing facility after surgery.  On the  evening of day 1, unfortunately the patient was confused and got out of  bed, and  was trying to get to the bathroom.  She pulled out her IV.  She  was  assisted back to bed.  She was not complaining of any pain.  The  narcotics all were discontinued.  She was doing a little bit better on  the rounds on the morning of day 2.  She was starting to orient.  She  knew who she was in and knew Dr. Lequita Halt.  We discontinued her Foley.  Unfortunately, her sodium was a little low, but her output was good.  Felt that it was just a little dilutional component, so we rechecked it.  She did okay on day 2, but unfortunately she had recurrence of her  confusion that evening and all the narcotics were discontinued.  Felt  that it would just take a little time for it to get out of her system.  Family did stay with her on the evening of day 2 to the morning of day  3.  She was worse at night more indicative of a sundowning component,  but did okay in the morning of day 3, she was walking about 100 feet.  We did keep her 1 more day for observation and she did better.  She was  back to essentially baseline by the morning of day 4.  INR was  therapeutic at 2.6.  Her sodium had improved the day before.  It was  felt she was stable enough to be transferred over to [facility] at  that time.   DISPOSITION:  The patient transferred to [facility]  on Apr 10, 2009.   CURRENT TRANSFER MEDICATIONS:  1. Coumadin protocol.  Please titrate Coumadin level for target INR      between 2.0 and 3.0 for 3 weeks for postoperative protocol.  Then      she is to resume her home Coumadin regimen because she is on      chronic Coumadin.  2. Colace 100 mg p.o. b.i.d.  3. Amiodarone 100 mg p.o. daily.  4. Wellbutrin XL 300 mg p.o. daily.  5. Atenolol 25 mg p.o. daily.  6. Triamterine/hydrochlorothiazide 37.5/25 one-half tablet daily.  7. Potassium chloride 10 mEq daily.  8. Xalatan 0.005% 1 drop each eye every p.m.  9. Prilosec 20 mg daily.  10.Robaxin 500 mg p.o. q.6-8 h. p.r.n.  spasm.  11.Optivar 0.05% ophthalmic solution p.r.n.  12.Tylenol 325 one-two every 4-6 hours a day for mild pain,      temperature or headache.  13.Laxative of choice.  14.Enema of choice.  15.Ultracet 1-2 every 4 hours as needed for pain.   DIET:  Heart-healthy cardiac diet.   ACTIVITIES:  She is weightbearing as tolerated to the left lower  extremity total knee protocol.  Home health PT and home health nursing.  Range of motion and strengthening exercises as per therapy.  She needs  to be up out of bed minimum b.i.d.   FOLLOW UP:  She needs to follow up with Dr. Lequita Halt in the office 2  weeks from the date of surgery, please contact the office at [phone] to  help arrange appointment time and followup of this patient at the  [facility] at [facility].   CONDITION ON DISCHARGE:  Improving.      Alexzandrew L. Perkins, P.A.C.      Ollen Gross, M.D.  Electronically Signed    ALP/MEDQ  D:  04/10/2009  T:  04/10/2009  Job:  [id number]   cc:   Ollen Gross, M.D.  Fax: [phone]   Peter M. Swaziland, M.D.  Fax: [phone]   Thora Lance, M.D.  Fax: [phone]   [facility]

## 2011-04-05 NOTE — Op Note (Signed)
NAME:  Alison Dodson, Alison Dodson               ACCOUNT NO.:  [id number]   MEDICAL RECORD NO.:  [id number]          PATIENT TYPE:  INP   LOCATION:  0004                         FACILITY:  [facility]   PHYSICIAN:  Ollen Gross, M.D.    DATE OF BIRTH:  06-Dec-1927   DATE OF PROCEDURE:  DATE OF DISCHARGE:                               OPERATIVE REPORT   PREOPERATIVE DIAGNOSIS:  Osteoarthritis, left knee.   POSTOPERATIVE DIAGNOSIS:  Osteoarthritis, left knee.   PROCEDURE:  Left total knee arthroplasty.   SURGEON:  Ollen Gross, M.D.   ASSISTANT:  Avel Peace, PA-C.   ANESTHESIA:  Spinal.   ESTIMATED BLOOD LOSS:  Minimal.   DRAIN:  None.   TOURNIQUET TIME:  30 minutes at 300 mmHg.   COMPLICATIONS:  None.   CONDITION:  Stable to recovery.   BRIEF CLINICAL NOTE:  [name] is a 77 year old female who has end-  stage arthritis of the left knee with progressively worsening pain and  dysfunction.  She has failed nonoperative management and presents now  for left total knee arthroplasty.   PROCEDURE IN DETAIL:  After the successful administration of spinal  anesthetic, a tourniquet was placed high on her left thigh and her left  lower extremity is prepped and draped in the usual sterile fashion.  The  extremity was wrapped in Esmarch, knee flexed, tourniquet inflated to  300 mmHg.  Midline incision is made with a 10-blade through subcutaneous  tissue to the level of the extensor mechanism.  A fresh blade is used to  make a medial parapatellar arthrotomy.  Soft tissue over the proximal  medial tibia is subperiosteally elevated to the joint line with the  knife and to the semimembranosus bursa with a Cobb elevator.  Soft  tissue laterally is elevated with attention being paid to avoiding the  patellar tendon on the tibial tubercle.  The patella subluxed laterally,  knee flexed 90 degrees, and ACL and PCL removed.  Drill was used to  create a starting hole in the distal femur and the canal was  thoroughly  irrigated.  The 5-degree left valgus alignment guide was placed and  referencing off the posterior condyles.  Rotation is marked and the  block pinned to remove 11 mm off the distal femur.  I took 11 because of  a preop flexion contracture.  Distal femoral resection is made with an  oscillating saw.  Sizing blocks placed, size 3 is most appropriate.  Rotation is marked at the epicondylar axis.  The size 3 cutting block is  placed and the anterior, posterior and chamfer cuts are made.   The tibia is subluxed forward and the menisci are removed.  Extramedullary tibial alignment guide is placed referencing proximally  at the medial aspect of the tibial tubercle and distally along the  second metatarsal axis and tibial crest.  Blocks pinned to remove  minimal bone off the more deficient medial side.  Tibial resection is  made with an oscillating saw.  Size 3 is the most appropriate tibial  component and the proximal tibia is prepared with the modular drill and  keel punch for the size 3.  Femoral preparation is completed with the  intercondylar cut.   Size 3 mobile bearing tibial trial, size 3 posterior stabilized femoral  trial, and a 10-mm posterior stabilized rotating platform insert trial  were placed.  With the 10 there is a tiny bit of hyperextension, with  the 12/5 which allowed for full extension with excellent varus-valgus  and anterior/posterior balance throughout full range of motion.  The  patella was then everted and thickness measured to be 21 mm.  Freehand  resection was taken at 12 mm, 35 template is placed, lug holes were  drilled, trial patella was placed and it tracks normally.  Osteophytes  were removed off the posterior femur with the trial in place.  All  trials were removed and the cut bone surfaces are prepared with  pulsatile lavage.  Cement was mixed and once ready for implantation the  size 3 mobile bearing tibial tray, size 3 posterior stabilized  femur and  35 patella are cemented into place and the patella was held with a  clamp.  Trial 12.5-mm insert placed, knee up in full extension and all  extruded cement removed.  When the cement was fully hardened, then the  permanent 12.5-mm posterior stabilized rotating platform insert is  placed into the tibial tray.  The wound was copiously irrigated with  saline solution and then the FloSeal injected on the posterior capsule,  medial and lateral gutters and suprapatellar area.  A moist sponge is  placed and tourniquet released with a total time of 30 minutes.  Sponge  was held for 2 minutes and then removed.  Minimal bleeding was  encountered.  The bleeding that is encountered is stopped with  electrocautery.  The wound was again irrigated and then the arthrotomy  closed with interrupted #1 PDS.  Flexion against gravity is about 140  degrees.  Subcu was closed with interrupted 2-0 Vicryl and subcuticular  running 4-0 Monocryl.  Incision is cleaned and dried and Steri-Strips  and a bulky sterile dressing were applied.  She is then placed into a  knee immobilizer, awakened, and transported to recovery in stable  condition.      Ollen Gross, M.D.  Electronically Signed     FA/MEDQ  D:  04/06/2009  T:  04/06/2009  Job:  [id number]

## 2011-04-08 NOTE — H&P (Signed)
NAME:  Alison Dodson, Alison Dodson               ACCOUNT NO.:  [id number]   MEDICAL RECORD NO.:  [id number]          PATIENT TYPE:  INP   LOCATION:                               FACILITY:  [facility]   PHYSICIAN:  Ollen Gross, M.D.    DATE OF BIRTH:  10-Jul-1928   DATE OF ADMISSION:  04/06/2009  DATE OF DISCHARGE:                              HISTORY & PHYSICAL   CHIEF COMPLAINT:  Left greater than right knee pain.   HISTORY OF PRESENT ILLNESS:  Patient is a 78 year old female who has  been seen by Dr. Lequita Halt for ongoing bilateral knee pain.  She had been  treated conservatively in the past for her end-stage arthritis utilizing  medications and also injections.  Despite conservative measures has  continued to have pain.  It is felt she would benefit from undergoing  surgical intervention.  She has been seen preoperatively by Dr. Swaziland  and also by Dr. Valentina Lucks, felt to be stable for surgery.  She is on  chronic Coumadin and she was recommended to come off her Coumadin prior  to her surgery.   ALLERGIES:  X-RAY DYE, TOPICAL IODINE CAUSES HER TO BREAK OUT, CODEINE  CAUSES ITCHING (PLEASE NOTE THE PATIENT IS ABLE TO TAKE PERCOCET).   CURRENT MEDICATIONS:  Potassium, atenolol, bupropion,  triamterine/hydrochlorothiazide, omeprazole, amiodarone, Coumadin,  alendronate, Tylenol Arthritis, calcium plus D, Centrum Silver, stool  softener, vitamin D, Xalatan drops, Optivar drops.   PAST MEDICAL HISTORY:  1. Anxiety.  2. Depression.  3. Glaucoma.  4. Coronary arterial disease.  5. Tachy-brady syndrome.  6. Past DDDR pacemaker implant.  7. Chronic coumadinization.  8. Hypertension.  9. Constipated-type irritable bowel syndrome.  10.Osteoarthritis.  11.Childhood illnesses of measles, mumps.   PAST SURGICAL HISTORY:  1. Pacemaker placement March 2009.  2. Gallbladder surgery.   FAMILY HISTORY:  Father with heart disease.  Mother with kidney disease.  Two siblings were diabetics.   SOCIAL HISTORY:   Widowed, past smoker, quit about 40 years ago, no  alcohol.  She does want to look into a skilled facility, is interested  in [facility].  She does have a ramp entering into her home.   REVIEW OF SYSTEMS:  GENERAL:  No fevers, chills, occasional night  sweats.  NEURO:  No seizures, syncope or paralysis.  RESPIRATORY:  A  little bit of shortness of breath on exertion, no shortness breath at  rest or productive cough.  GI:  Some constipation, she has a constipated  type IBS.  No nausea, vomiting, diarrhea.  GU:  A little bit of  frequency, nocturia.  No dysuria, hematuria.  MUSCULOSKELETAL:  Bilateral knee pain.   PHYSICAL EXAMINATION:  VITAL SIGNS:  Pulse 76, respiratory rate 16,  blood pressure 120/62.  GENERAL:  A 77 year old white female well-nourished, well-developed, no  acute distress.  She is alert, oriented and cooperative, pleasant,  slightly overweight, accompanied by her daughter.  HEENT:  Normocephalic/atraumatic.  Pupils are round and reactive, EOMs  intact.  NECK:  Supple.  CHEST: Clear.  HEART:  Regular rate and rhythm.  No murmur, S1 - S2 noted.  ABDOMEN:  Soft, nontender, bowel sounds present.  Rectal, breasts, genitalia not done, not pertinent to present illness.  EXTREMITIES:  Left knee range of motion 5 - 115, marked crepitus, slight  varus, no instability.  Right knee range of motion 5 - 120, marked  crepitus, tender more medially than lateral, no instability.   IMPRESSION:  Osteoarthritis left knee greater than right knee.   PLAN:  The patient admitted to [facility], will undergo a left  total knee replacement arthroplasty.   MEDICAL PHYSICIAN:  Dr. Kirby Funk.   CARDIOLOGIST:  Dr. Peter Swaziland.   Both be notified of the room number on admission and will be consulted  if needed for medical assistance or cardiac assistance with this patient  throughout the hospital course.      Alexzandrew L. Perkins, P.A.C.      Ollen Gross,  M.D.  Electronically Signed    ALP/MEDQ  D:  04/05/2009  T:  04/06/2009  Job:  [id number]   cc:   Ollen Gross, M.D.  Fax: [phone]   Peter M. Swaziland, M.D.  Fax: [phone]   Thora Lance, M.D.  Fax: [phone]

## 2011-04-08 NOTE — H&P (Signed)
NAME:  Alison Dodson, Alison Dodson               ACCOUNT NO.:  [id number]   MEDICAL RECORD NO.:  [id number]          PATIENT TYPE:  INP   LOCATION:  3705                         FACILITY:  [facility]   PHYSICIAN:  Armanda Magic, M.D.     DATE OF BIRTH:  22-Oct-1928   DATE OF ADMISSION:  02/15/2007  DATE OF DISCHARGE:                              HISTORY & PHYSICAL   PRIMARY CARE PHYSICIAN:  Kirby Funk, MD.   CARDIOLOGIST:  Armanda Magic, M.D.   CHIEF COMPLAINT:  Chest pain.   HISTORY OF PRESENT ILLNESS:  Mrs. Cosey is a 77 year old female with no  known history of coronary artery disease.  She has a history of chronic  atrial fibrillation, hypertension and GERD.  Yesterday she reported  being her usual state of health until after exercising, in which she  experienced dizziness which later resolved.  On this morning she was  scheduled for routine outpatient adenosine Cardiolite at [facility]  Cardiology, however, reported a sudden onset of substernal chest pain  that was nonexertional after awakening.  She described the pain as  indigestion.  The pain did not radiate.  She denied shortness of  breath, diaphoresis, nausea, vomiting, or dizziness.  The duration of  the pain was 2 hours and she denied treatment for it.  Rest images were  obtained at [facility] for the adenosine Cardiolite, however, the  patient is being directly admitted to the [facility] Telemetry  Unit for unstable angina to rule out an MI.   PAST MEDICAL HISTORY:  1. Paroxysmal atrial fibrillation.  2. Hypertension.  3. GERD.  4. Depression.  5. Chronic back pain.  6. Bilateral knee DJD.  7. DJD of the hands and feet.  8. Asthmatic bronchitis.  9. Diverticulosis with diverticulitis.  10.Left retinal tear.   ALLERGIES:  1. IODINE  2. CODEINE - rash.   CURRENT MEDICATIONS:  1. Coumadin as directed.  2. Bupropion XL 300 mg daily.  3. Atenolol 50 mg daily.  4. Colace 100 mg twice daily.  5. Omeprazole 20 mg  daily.  6. Hydrochlorothiazide 12.5 mg daily.  7. Tylenol 650 mg 1-2 tablets daily.  8. Calcium 600 mg plus vitamin D twice daily.  9. Multivitamin 1 tablet daily.   PAST SURGICAL HISTORY:  1. Status post breast reduction in 1983.  2. Status post left bowel resection for diverticulitis in 1985.  3. Status post left retinal tear surgery 5 years ago by Dr. Ashley Royalty.  4. Status post bilateral cataract extraction.  5. Status post laparoscopic cholecystectomy in 2006.   FAMILY HISTORY:  Father deceased age 53, MI.  Her mother died at age 32  of renal failure.  She has 14 siblings, three of whom are living.  There  is a family history of hypertension, diabetes mellitus, coronary artery  disease in multiple siblings.  Her brother had colon cancer.  She has a  son with diabetes mellitus.  She has several siblings with heart disease  in their 64s.   SOCIAL HISTORY:  Widow with two adult children and four stepchildren.  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  She denies tobacco or illicit drug use, however, admits to  occasional alcohol use.   REVIEW OF SYSTEMS:  All other systems reviewed are negative other than  what is stated in the HPI.   PHYSICAL EXAMINATION:  GENERAL:  A 77 year old female, pleasant and  cooperative, NAD.  VITALS:  Blood pressure 134/90, pulse 84 and irregular, weight 215  pounds, height 63 and 1/4 inches.  HEENT: Benign.  NECK:  Supple without JVD or bilateral carotid bruits.  Carotid  upstrokes 2+.  PULMONARY:  Breath sounds are equal and clear to auscultation  bilaterally.  No use of accessory muscles.  CARDIOVASCULAR: Irregularly irregular.  Normal S1-S2 without murmurs,  gallops, clicks or rubs.  ABDOMEN:  Benign.  EXTREMITIES:  No peripheral edema, cyanosis, or clubbing.  DP pulses  2+/2, bilaterally.  SKIN:  Warm and dry without rashes or lesions.  NEUROLOGIC:  No focal motor or sensory deficits.  PSYCHIATRIC:  Normal  mood and affect.  BACK:  No kyphosis or scoliosis.   LABORATORY DATA:  A 12-lead EKG on February 15, 2007, revealed atrial  fibrillation with a ventricular rate of 89 beats per minute.  There was  no evidence of acute ST-segment/T-wave changes.   BMET, CBC, BNP, TSH, PT, PTT, INR, EKG upon admission and daily,  magnesium, and cardiac panel are pending.   ASSESSMENT:  1. Chest pain consistent with unstable angina.  2. Paroxysmal atrial fibrillation, rate controlled.  3. Systemic anticoagulation with Coumadin therapy.  4. Hypertension, controlled.  5. Gastroesophageal reflux disease.  6. Otherwise as stated in the past medical history.   PLAN:  1. Admit to a cardiac telemetry unit under the service of Dr. Armanda Magic with a diagnosis of unstable angina.  2. Rule out MI.  Cardiac panel including troponin-I q.8h. x3.  If the      patient rules out for MI, her stress images will be completed as an      outpatient at [facility] cardiology.  3. IV nitroglycerin 10 mcg per minute.  Titrate 5-10 mcg per minute      for chest pain relief, keeping systolic blood pressure greater than      100.  4. Coumadin per pharmacy protocol.  5. Continue home medications.  6. IV morphine 2-4 mg every 1-2 hours as needed.  7. Start aspirin 81 mg daily.  8. Initiate cardiology p.r.n. orders.  9. The patient was seen, interviewed, and examined by Dr. Armanda Magic      who participated in the medical decision making and plan of care.  10.Further measures per Dr. Armanda Magic.  11.Stat GI cocktail upon arrival to the hospital.      Tylene Fantasia, PA      Armanda Magic, M.D.  Electronically Signed    RDM/MEDQ  D:  02/15/2007  T:  02/15/2007  Job:  [id number]

## 2011-04-08 NOTE — Discharge Summary (Signed)
NAME:  Alison Dodson, Alison Dodson               ACCOUNT NO.:  [id number]   MEDICAL RECORD NO.:  [id number]          PATIENT TYPE:  INP   LOCATION:  3705                         FACILITY:  [facility]   PHYSICIAN:  Verdell Face Muse, PA    DATE OF BIRTH:  1928-08-23   DATE OF ADMISSION:  02/15/2007  DATE OF DISCHARGE:                               DISCHARGE SUMMARY   ADMISSION DIAGNOSIS:  Chest pain.   DISCHARGE DIAGNOSES:  1. Chest pain, resolved, atypical in nature, questionable etiology,      possibly GERD.  MI ruled out with negative serial cardiac enzymes      x3 and negative EKG.  2. Hypokalemia, repleted.  3. Paroxysmal atrial fibrillation.  4. Hypertension, controlled.  5. GERD.  6. Depression.  7. Chronic back pain.  8. Bilateral knee DJD.  9. DJD of the hands and feet  10.Asthmatic bronchitis.  11.Diverticulosis with diverticulitis.  12.Left retinal tear.  13.Systemic anticoagulation with Coumadin therapy with therapeutic INR      of 2.6.   CONSULTATIONS:  None.   PROCEDURES:  None.   HOSPITAL COURSE:  Mrs. Island is a 77 year old Caucasian female with no  known history of coronary artery disease.  She has a history of chronic  atrial fibrillation, hypertension, and GERD.  On yesterday, she was  admitted to the [facility] with a chief complaint of chest  pain.  Serial cardiac enzymes were negative x3 with a peak troponin of  0.02.  EKG revealed atrial fibrillation with a ventricular rate of 95  beats per minute and a QTC of 459 milliseconds.  There was no evidence  of acute ST-segment/T-wave changes.  Chest x-ray revealed no acute  cardiopulmonary disease.  TSH was normal.  D-dimer was normal.  BNP was  nondiagnostic at 126.  The patient is on systemic anticoagulation with  Coumadin therapy with a therapeutic INR at 2.6.  She was started on IV  nitroglycerin upon admission.  However, on today that was weaned and  then discontinued.  The patient had no further complaints of  chest pain  during this admission.  She is being discharged to home today in stable  condition without evidence of chest pain, shortness of breath,  diaphoresis, or dizziness.  She was seen and examined by Dr. Mayford Knife who  was in agreement with the discharge decision.  Her home medications will  be continued.  She will follow up at the [facility] Cardiology office as an  outpatient for completion of the stress portion of her adenosine  Cardiolite on Monday February 19, 2007 at 2:15 p.m.   LABORATORY DATA:  White blood count 5.5, hemoglobin 14.5, hematocrit  43.1, platelets 266,000.  PT 29.5, INR 2.6, sodium 140, potassium 3.4,  chloride 106, CO2 25, glucose 98, BUN 18, creatinine 0.89, calcium 9.4  magnesium 1.8, D-dimer less than 0.22.  TSH 1.605.  BNP 126.0.  Serial  cardiac enzymes:  CK total 48, 63, and 58, respectively.  CK-MB 1.4,  1.2, and 1.3, respectively.  Troponin I less than 0.01 and 0.02 x2   X-rays as stated in the  hospital course.   EKG:  A 12 lead EKG February 16, 2007 revealed atrial fibrillation with a  ventricular rate of 74 beats per minute.  There was no evidence of acute  ischemic changes.  QTC 450 milliseconds.   CONDITION UPON DISCHARGE:  Stable.   DISCHARGE MEDICATIONS:  1. Coumadin as directed  2. Atenolol 50 mg daily.  3. HCTZ 12.5 mg daily.  4. Bupropion XL 300 mg daily.  5. Colace 100 mg twice daily.  6. Calcium 600 mg twice daily.  7. Multivitamin 1 tablet daily.  8. Omeprazole 20 mg twice daily.  9. Vitamin D 1.25 mg weekly.  10.Potassium chloride 10 mEq daily.  This represented a new      prescription, and prescription was provided with 11 refills.   DISCHARGE INSTRUCTIONS:  1. No restrictions upon activity.  2. Continue a low-sodium diet.  3. Stop any activity that causes chest pain, shortness of breath,      dizziness, sweating, or excessive weakness.   FOLLOW-UP ARRANGEMENTS:  1. Adenosine-Cardiolite at [facility] cardiology, stress portion only,       February 19, 2007, at 2:15 p.m..  The patient is scheduled call [phone] if she is unable to make the scheduled appointment.  2. Follow-up appointment with Dr. Carolanne Grumbling on March 01, 2007, at      11:15 a.m..  The patient is scheduled to call [phone] if she is      unable to make the scheduled appointment.  3. Follow up with Dr. Kirby Funk in regard to noncardiac cause of      chest pain, most likely GERD.      Tylene Fantasia, [place]     RDM/MEDQ  D:  02/16/2007  T:  02/16/2007  Job:  [id number]   cc:   Armanda Magic, M.D.  Thora Lance, M.D.

## 2011-04-08 NOTE — Op Note (Signed)
NAME:  Alison Dodson, Alison Dodson NO.:  [id number]   MEDICAL RECORD NO.:  [id number]          PATIENT TYPE:  OIB   LOCATION:  2899                         FACILITY:  [facility]   PHYSICIAN:  Gita Kudo, M.D. DATE OF BIRTH:  07/25/1928   DATE OF PROCEDURE:  04/19/2005  DATE OF DISCHARGE:                                 OPERATIVE REPORT   OPERATIVE PROCEDURE:  Laparoscopic cholecystectomy with intraoperative  cholangiogram.   SURGEON:  Jerelene Redden, M.D.   ASSISTANTMaple Hudson.   ANESTHESIA:  General endotracheal.   PREOPERATIVE DIAGNOSIS:  Gallstones.   POSTOPERATIVE DIAGNOSIS:  Gallstones plus normal cholangiogram.   CLINICAL SUMMARY:  A 77 year old female with gallstones that are  symptomatic. Original surgery was scheduled a few months ago but because of  atrial fibrillation, it was postponed until now and she is now in regular  rhythm.   OPERATIVE FINDINGS:  The gallbladder was somewhat thickened. It had at least  one large stone in it. Cholangiogram looked normal. There was some oozing  from the liver bed that was controlled by cautery and then Surgicel at the  end.   OPERATIVE PROCEDURE:  Under satisfactory general endotracheal anesthesia,  having received 1.0 grams Ancef preoperatively, the patient was positioned,  prepped and draped in the standard fashion. A total of 30 mL of 0.5%  Marcaine was infiltrated at the skin incision sites for postoperative  analgesia. Midline incision was made below the umbilicus and carried down to  the fascia. The midline was opened and controlled with a figure-of-eight 0-  Vicryl suture. A Hassan operating port was inserted and secured. Good CO2  pneumoperitoneum established and camera placed Under direct vision, two #5  ports placed laterally and a second #10 medially. Lateral graspers gave  excellent exposure and operating through the medial port, I carefully  dissected the cystic duct and cystic artery. When we were certain  of the  anatomy, multiple clips were placed on the artery and it was divided. A  single clip was placed on the cystic duct near the gallbladder and an  incision made. A percutaneous catheter was placed and a good cholangiogram  obtained. Catheter removed and multiple clips placed on the distal duct and  the duct was then divided. The gallbladder was then removed from below  upward using coagulating current for hemostasis and dissection. The liver  oozed at the site of the dissection in several places and this was  controlled by cautery. Lavaged with saline after the gallbladder was  removed. It was then suctioned dry and some Surgicel placed for additional  hemostasis.   Camera was then moved to the upper port and through the lower port, a  grasper used to remove the gallbladder intact, without spillage or  complication. The operative site was then again checked. The abdomen lavaged  with saline and suctioned dry. The ports were removed under direct vision  and then the CO2 released. The midline was  closed with a previous figure-of-eight and a second interrupted 0-Vicryl  suture. Then, 4-0 Vicryl approximated the subcu and Steri-Strips for skin.  There were  no complications. The sponge and needle counts were correct.  Patient was taken to the recovery room in good condition.      MRL/MEDQ  D:  04/19/2005  T:  04/19/2005  Job:  [id number]   cc:   Thora Lance, M.D.  [address]  [place]  [place] [address]  Fax: [phone]

## 2011-04-15 ENCOUNTER — Ambulatory Visit (INDEPENDENT_AMBULATORY_CARE_PROVIDER_SITE_OTHER): Payer: Medicare Other | Admitting: *Deleted

## 2011-04-15 DIAGNOSIS — Z7901 Long term (current) use of anticoagulants: Secondary | ICD-10-CM

## 2011-04-15 DIAGNOSIS — I4891 Unspecified atrial fibrillation: Secondary | ICD-10-CM

## 2011-04-15 LAB — POCT INR: INR: 3.8

## 2011-04-29 ENCOUNTER — Ambulatory Visit (INDEPENDENT_AMBULATORY_CARE_PROVIDER_SITE_OTHER): Payer: Medicare Other | Admitting: *Deleted

## 2011-04-29 DIAGNOSIS — I4891 Unspecified atrial fibrillation: Secondary | ICD-10-CM

## 2011-04-29 DIAGNOSIS — Z7901 Long term (current) use of anticoagulants: Secondary | ICD-10-CM

## 2011-04-29 LAB — POCT INR: INR: 2.4

## 2011-05-11 ENCOUNTER — Ambulatory Visit (INDEPENDENT_AMBULATORY_CARE_PROVIDER_SITE_OTHER): Payer: Medicare Other | Admitting: *Deleted

## 2011-05-11 DIAGNOSIS — Z7901 Long term (current) use of anticoagulants: Secondary | ICD-10-CM

## 2011-05-11 DIAGNOSIS — I4891 Unspecified atrial fibrillation: Secondary | ICD-10-CM

## 2011-05-13 ENCOUNTER — Encounter: Payer: Self-pay | Admitting: Cardiology

## 2011-05-18 NOTE — H&P (Addendum)
Alison Dodson, Alison Dodson NO.:  [id number]  MEDICAL RECORD NO.:  [id number]  LOCATION:                                 FACILITY:  PHYSICIAN:  Alison Dodson, M.D.    DATE OF BIRTH:  1928-07-24  DATE OF ADMISSION:  05/17/2011 DATE OF DISCHARGE:                             HISTORY & PHYSICAL   CHIEF COMPLAINT:  Right knee pain.  BRIEF HISTORY:  Alison Dodson had a left total knee arthroplasty 2 years ago by Alison Dodson and has done very well with that.  She is at a point now where the right knee is getting progressively worse, it is hurting all the time and actually it hurts more than the left knee did prior to surgery.  She now presents for a right total knee arthroplasty.  She has been cleared for surgery by her primary care physician, Dr. Wylene Dodson, and her cardiologist, Dr. Peter Dodson.  Dr. Swaziland recommends that the patient hold her Coumadin for 5 days prior to surgery.  MEDICATION ALLERGIES:  IODINE, this causes a rash.  CURRENT MEDICATIONS: 1. Alendronate sodium 70 mg once weekly. 2. Amiodarone 200 mg 1 tablet p.o. daily. 3. Atenolol 50 mg 1 tablet p.o. daily. 4. Bupropion 24 XL 300 mg tablet once daily. 5. __________ stool softener once or twice daily p.r.n. constipation. 6. Donepezil HCl 5 mg tablet 1 tablet p.o. q.h.s. 7. Klor-Con 10 mEq tablet once daily. 8. Omeprazole 20 mg 1 tablet p.o. daily. 9. Pot chlor ER tablets once daily. 10.Triamterene/hydrochlorothiazide 37.5/25 mg 1 tablet p.o. daily. 11.Vitamin D2 capsule 50,000 International Units 1 tablet p.o.     monthly. 12.Coumadin, which is dosed per Dr. Elvis Dodson office and she will     discontinue this 5 days prior to surgery.  We will restart her on     this postoperatively. 13.Latanoprost ophthalmic solution 2.5 mL once daily. 14.Optivar once daily. 15.Systane p.r.n.  PAST MEDICAL HISTORY: 1. End-stage arthritis of the right knee. 2. Some impaired memory. 3. Impaired vision. 4.  Anxiety. 5. Depression. 6. Glaucoma. 7. Heart disease. 8. Hypertension. 9. 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 10.Arthritis.  PAST SURGICAL HISTORY: 1. Left total knee arthroplasty. 2. Cholecystectomy. 3. Bowel resection.  She had some nausea postop the bowel resection.  FAMILY HISTORY:  Unknown.  SOCIAL HISTORY:  The patient is widowed.  She in the past used tobacco products.  She plans to go [place] following her hospital stay.  REVIEW OF SYSTEMS:  GENERAL:  Positive for memory loss.  HEENT/NEURO: Negative for headache or blurred vision.  DERMATOLOGIC:  Negative for rash or lesion.  RESPIRATORY:  Negative for shortness of breath. CARDIOVASCULAR:  Negative for chest pain.  GI: Negative for nausea, vomiting, or diarrhea.  GU:  Negative for hematuria or dysuria. MUSCULOSKELETAL:  Positive for joint pain.  As a side note, the patient's daughter is accompanying her at today's visit and she is concerned as the last hospital stay the patient had, she experienced quite a bit of sundowner, and she is concerned as the patient's memory has seem to have worsened that the disorientation of being in the hospital plus narcotic usage may make things worse for her.  She is just requesting that we keep her as oriented as possible and if possible not use a lot of narcotics.  PHYSICAL EXAMINATION:  VITAL SIGNS:  Pulse 76, respirations 18, blood pressure 118/70 in the left arm. GENERAL:  Alison Dodson is alert and oriented x3, well developed, well nourished, in no apparent distress.  She is a pleasant 77 year old female.  She has a stated height of 5 feet 6 inches. HEENT:  Normocephalic, atraumatic.  Extraocular movements intact. NECK:  Supple.  Full range of motion without lymphadenopathy. CHEST:  Lungs are clear to auscultation bilaterally without wheezing. She has a pacemaker in the left upper chest. HEART:  Regular rate and rhythm without murmur. ABDOMEN:  Bowel sounds present in all  4 quadrants. EXTREMITIES:  Right knee negative for effusion.  Range is 5 to 120 degrees.  She has marked crepitus noted throughout the range.  SKIN: Unremarkable. NEUROLOGIC:  Intact.  RADIOGRAPHS:  AP and lateral views of the right knee reveal bone-on-bone on medial and patellofemoral compartments with bony erosion in the medial compartment.  IMPRESSION:  End-stage arthritis of the right knee.  PLAN:  Right total knee arthroplasty to be performed by Alison Dodson.     Alison Dodson, PAC   ______________________________ Alison Dodson, M.D.    LD/MEDQ  D:  05/18/2011  T:  05/18/2011  Job:  [id number]  cc:   Alison Dodson, M.D. Fax: [phone]  Alison Dodson, M.D. Fax: [phone]  Electronically Signed by Alison Dodson  on 05/18/2011 02:03:12 PM Electronically Signed by Alison Dodson M.D. on 06/01/2011 12:34:08 PM

## 2011-05-24 ENCOUNTER — Other Ambulatory Visit (HOSPITAL_COMMUNITY): Payer: Self-pay | Admitting: *Deleted

## 2011-05-24 ENCOUNTER — Ambulatory Visit (HOSPITAL_COMMUNITY)
Admission: RE | Admit: 2011-05-24 | Discharge: 2011-05-24 | Disposition: A | Discharge status: Home / Self Care | Payer: Medicare Other | Source: Ambulatory Visit | Attending: Orthopedic Surgery | Admitting: Orthopedic Surgery

## 2011-05-24 ENCOUNTER — Telehealth: Payer: Self-pay | Admitting: Cardiology

## 2011-05-24 ENCOUNTER — Other Ambulatory Visit (HOSPITAL_COMMUNITY): Payer: Self-pay | Admitting: Orthopedic Surgery

## 2011-05-24 ENCOUNTER — Other Ambulatory Visit: Payer: Self-pay | Admitting: Orthopedic Surgery

## 2011-05-24 ENCOUNTER — Encounter (HOSPITAL_COMMUNITY): Payer: Medicare Other

## 2011-05-24 DIAGNOSIS — M1710 Unilateral primary osteoarthritis, unspecified knee: Secondary | ICD-10-CM

## 2011-05-24 DIAGNOSIS — M25569 Pain in unspecified knee: Secondary | ICD-10-CM | POA: Insufficient documentation

## 2011-05-24 DIAGNOSIS — I10 Essential (primary) hypertension: Secondary | ICD-10-CM | POA: Insufficient documentation

## 2011-05-24 DIAGNOSIS — Z01818 Encounter for other preprocedural examination: Secondary | ICD-10-CM | POA: Insufficient documentation

## 2011-05-24 DIAGNOSIS — Z950 Presence of cardiac pacemaker: Secondary | ICD-10-CM | POA: Insufficient documentation

## 2011-05-24 DIAGNOSIS — Z01812 Encounter for preprocedural laboratory examination: Secondary | ICD-10-CM | POA: Insufficient documentation

## 2011-05-24 DIAGNOSIS — Z79899 Other long term (current) drug therapy: Secondary | ICD-10-CM | POA: Insufficient documentation

## 2011-05-24 LAB — URINALYSIS, ROUTINE W REFLEX MICROSCOPIC
Bilirubin Urine: NEGATIVE
Hgb urine dipstick: NEGATIVE
Nitrite: NEGATIVE
Specific Gravity, Urine: 1.019 (ref 1.005–1.030)
pH: 5 (ref 5.0–8.0)

## 2011-05-24 LAB — URINE MICROSCOPIC-ADD ON

## 2011-05-24 LAB — SURGICAL PCR SCREEN
MRSA, PCR: NEGATIVE
Staphylococcus aureus: POSITIVE — AB

## 2011-05-24 LAB — COMPREHENSIVE METABOLIC PANEL
ALT: 14 U/L (ref 0–35)
AST: 24 U/L (ref 0–37)
Calcium: 9.8 mg/dL (ref 8.4–10.5)
Sodium: 141 mEq/L (ref 135–145)
Total Protein: 6.9 g/dL (ref 6.0–8.3)

## 2011-05-24 LAB — PROTIME-INR
INR: 2.78 — ABNORMAL HIGH (ref 0.00–1.49)
Prothrombin Time: 29.8 seconds — ABNORMAL HIGH (ref 11.6–15.2)

## 2011-05-24 LAB — CBC
MCH: 30.4 pg (ref 26.0–34.0)
MCHC: 32.3 g/dL (ref 30.0–36.0)
Platelets: 257 10*3/uL (ref 150–400)

## 2011-05-24 NOTE — Telephone Encounter (Deleted)
Fax: [phone] LAtest OV, ECHO, EKG, STRESS

## 2011-06-02 ENCOUNTER — Ambulatory Visit (INDEPENDENT_AMBULATORY_CARE_PROVIDER_SITE_OTHER): Payer: Medicare Other | Admitting: *Deleted

## 2011-06-02 DIAGNOSIS — Z7901 Long term (current) use of anticoagulants: Secondary | ICD-10-CM

## 2011-06-02 DIAGNOSIS — I4891 Unspecified atrial fibrillation: Secondary | ICD-10-CM

## 2011-06-06 ENCOUNTER — Inpatient Hospital Stay (HOSPITAL_COMMUNITY)
Admission: RE | Admit: 2011-06-06 | Discharge: 2011-06-09 | DRG: 470 | Disposition: A | Discharge status: Skilled Nursing Facility | Payer: Medicare Other | Source: Ambulatory Visit | Attending: Orthopedic Surgery | Admitting: Orthopedic Surgery

## 2011-06-06 DIAGNOSIS — I10 Essential (primary) hypertension: Secondary | ICD-10-CM | POA: Diagnosis present

## 2011-06-06 DIAGNOSIS — M1710 Unilateral primary osteoarthritis, unspecified knee: Principal | ICD-10-CM | POA: Diagnosis present

## 2011-06-06 DIAGNOSIS — Z01812 Encounter for preprocedural laboratory examination: Secondary | ICD-10-CM

## 2011-06-06 DIAGNOSIS — H409 Unspecified glaucoma: Secondary | ICD-10-CM | POA: Diagnosis present

## 2011-06-06 DIAGNOSIS — Z7901 Long term (current) use of anticoagulants: Secondary | ICD-10-CM

## 2011-06-06 DIAGNOSIS — E876 Hypokalemia: Secondary | ICD-10-CM | POA: Diagnosis not present

## 2011-06-06 DIAGNOSIS — I4891 Unspecified atrial fibrillation: Secondary | ICD-10-CM | POA: Diagnosis present

## 2011-06-06 DIAGNOSIS — Z950 Presence of cardiac pacemaker: Secondary | ICD-10-CM

## 2011-06-06 DIAGNOSIS — F341 Dysthymic disorder: Secondary | ICD-10-CM | POA: Diagnosis present

## 2011-06-06 LAB — TYPE AND SCREEN: Antibody Screen: NEGATIVE

## 2011-06-06 LAB — APTT: aPTT: 29 seconds (ref 24–37)

## 2011-06-07 LAB — BASIC METABOLIC PANEL
CO2: 28 mEq/L (ref 19–32)
Calcium: 8.8 mg/dL (ref 8.4–10.5)
GFR calc Af Amer: >60 mL/min (ref >60)
GFR calc non Af Amer: >60 mL/min (ref >60)
Sodium: 136 mEq/L (ref 135–145)

## 2011-06-07 LAB — CBC
HCT: 34.3 % — ABNORMAL LOW (ref 36.0–46.0)
RDW: 13.3 % (ref 11.5–15.5)
WBC: 6.8 10*3/uL (ref 4.0–10.5)

## 2011-06-07 LAB — PROTIME-INR
INR: 1.26 (ref 0.00–1.49)
Prothrombin Time: 16.1 seconds — ABNORMAL HIGH (ref 11.6–15.2)

## 2011-06-08 LAB — BASIC METABOLIC PANEL
CO2: 29 mEq/L (ref 19–32)
Calcium: 9.3 mg/dL (ref 8.4–10.5)
Chloride: 100 mEq/L (ref 96–112)
Creatinine, Ser: 0.75 mg/dL (ref 0.50–1.10)
Glucose, Bld: 97 mg/dL (ref 70–99)

## 2011-06-08 LAB — PROTIME-INR
INR: 2.24 — ABNORMAL HIGH (ref 0.00–1.49)
Prothrombin Time: 25.2 seconds — ABNORMAL HIGH (ref 11.6–15.2)

## 2011-06-08 LAB — CBC
MCV: 93 fL (ref 78.0–100.0)
Platelets: 200 10*3/uL (ref 150–400)
RBC: 3.72 MIL/uL — ABNORMAL LOW (ref 3.87–5.11)
WBC: 7.9 10*3/uL (ref 4.0–10.5)

## 2011-06-09 LAB — CBC
HCT: 33.8 % — ABNORMAL LOW (ref 36.0–46.0)
MCH: 30.9 pg (ref 26.0–34.0)
MCV: 93.1 fL (ref 78.0–100.0)
RDW: 13.5 % (ref 11.5–15.5)
WBC: 7.6 10*3/uL (ref 4.0–10.5)

## 2011-06-10 NOTE — Op Note (Signed)
NAMEVANDY, Alison Dodson NO.:  [id number]  MEDICAL RECORD NO.:  [id number]  LOCATION:  0003                         FACILITY:  [facility]  PHYSICIAN:  Ollen Gross, M.D.    DATE OF BIRTH:  25-Nov-1927  DATE OF PROCEDURE:  06/06/2011 DATE OF DISCHARGE:                              OPERATIVE REPORT   PREOPERATIVE DIAGNOSIS:  Osteoarthritis, right knee.  POSTOPERATIVE DIAGNOSIS:  Osteoarthritis, right knee.  PROCEDURE PERFORMED:  Right total knee arthroplasty.  SURGEON:  Ollen Gross, M.D.  ASSISTANT:  Alexzandrew L. Perkins, P.A.C.  ANESTHESIA:  Spinal.  ESTIMATED BLOOD LOSS:  Minimal.  DRAINS:  Hemovac x1.  TOURNIQUET TIME:  Thirty six minutes at 300 mmHg.  COMPLICATIONS:  None.  CONDITION:  Stable to the recovery room.  BRIEF CLINICAL NOTE: Alison Dodson is an 77 year old female with end-stage arthritis of the right knee with progressively worsening pain and dysfunction.  She has had a previous successful left total knee arthroplasty, presents now for right total knee arthroplasty.  PROCEDURE IN DETAIL:  After the successful administration of spinal anesthetic, tourniquet is placed high in the patient's right thigh and right lower extremity, prepped and draped in the usual sterile fashion. The extremity is wrapped and esmarched. The knee flexed. The tourniquet inflated to 300 mmHg. A midline incision is made with a 10 blade through the subcutaneous tissue to the level of the extensor mechanism. A fresh blade is used to make a medial parapatellar arthrotomy. The soft tissue over the proximal medial tibia is subperiosteally elevated to the joint line with a knife and into the semimembranosus bursa with a Cobb elevator. The soft tissue laterally is elevated with attention being paid to avoid the patellar tendon on the tibial tubercle. The patella is everted and the knee flexed to 90 degrees. ACL and PCL remnant removed. The drill is used to create a  starting hole in the distal femur, and the canal is thoroughly irrigated. The 5 degree right valgus alignment guide is placed. Distal femoral cutting block is placed and pins removed, 10 mm off the distal femur. Resection was made with an oscillating saw. Sizing guide is placed, size 4 narrow is most appropriate. Rotation is marked off the epicondylar axis and a cutting block pin to match the epicondylar axis.  The anterior and posterior and chamfer cuts are made.  The tibia is subluxed forward, and menisci are removed. An extramedullary tibial alignment guide is placed, referencing proximally at the medial aspect of the tibial tubercle and distally along the second metatarsal axis and tibial crest. The block is pinned to remove 2 mm off the nondeficient lateral side. Tibial resection is made with an oscillating saw. The size 3 was also the most appropriate tibial component.  The proximal tibia is prepared with a modular drill and keel punch for the size 3. Femoral preparation is completed with the intracondylar cut.  Size was removed bearing tibial trial size 4 narrow.  Posterior stabilized femoral trial and a 12.5 posterior stabilized rotating platform insert trial was placed. With a 12.5, full extension was achieved with excellent varus and valgus anterior and posterior balance throughout, full range of motion.  The patella was  everted and thickness measured to be 23 mm.  Free-hand resection is taken to 13 mm, a 35 template is placed, lug holes are drilled, trial patella is placed, and it tracks normally.  The osteophytes are removed off the posterior femur with the trial in place.  All trials were removed, and the cut-bone surfaces are prepared with pulsatile lavage.  Cement is mixed; and once ready for implantation, a size 3 mobile-bearing tibial tray, size 4 narrow posterior stabilized femur and 35 patella are cemented into place.  The patella is held with a clamp.  A trial 12.5  mm insert is placed.  The knee held in full extension, and all extruded cement is removed.  Once the cement is fully hardened, then the permanent 12.5 mm posterior stabilized rotating platform insert is placed into the tibial tray.  The wound is copiously irrigated with saline solution, and the arthrotomy closed over Hemovac drain with interrupted #1 PDS.  Flexion against gravity was135 degrees and patella tracks normally.  The tourniquet was released, total time of 36 minutes.  The subcu is closed with interrupted 2-0 Vicryl and the subcuticular with running 4-0 Monocryl.  Catheter for morphine pain pump was placed and the pump was initiated.  The incision was cleaned and dried, and Steri-Strips and a bulky sterile dressing were applied.  She was then awakened and transported to recovery in stable condition.     Ollen Gross, M.D.     FA/MEDQ  D:  06/06/2011  T:  06/06/2011  Job:  [id number]  Electronically Signed by Ollen Gross M.D. on 06/10/2011 06:05:13 PM

## 2011-06-10 NOTE — Discharge Summary (Signed)
Alison Dodson, Alison Dodson NO.:  [id number]  MEDICAL RECORD NO.:  [id number]  LOCATION:  1612                         FACILITY:  [facility]  PHYSICIAN:  Ollen Gross, M.D.    DATE OF BIRTH:  1927/12/03  DATE OF ADMISSION:  06/06/2011 DATE OF DISCHARGE:  06/09/2011                        DISCHARGE SUMMARY - REFERRING   ADMITTING DIAGNOSES: 1. Osteoarthritis, right knee. 2. Impaired memory. 3. Impaired vision. 4. Anxiety. 5. Depression. 6. Glaucoma. 7. Heart disease. 8. Hypertension. 9. 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 10.Osteoarthritis.  DISCHARGE DIAGNOSES: 1. Osteoarthritis right knee, status post right total knee replacement     arthroplasty. 2. Postop hypokalemia, improved. 3. Impaired memory. 4. Impaired vision. 5. Anxiety. 6. Depression. 7. Glaucoma. 8. Heart disease. 9. Hypertension. 10."Bleeding disorder" secondary to Coumadin. 11.Osteoarthritis.  PROCEDURE:  June 06, 2011, right total knee.  SURGEON:  Ollen Gross, MD  ASSISTANT:  Alexzandrew L. Perkins, P.A.C.  ANESTHESIA:  Spinal anesthesia.  TOURNIQUET TIME:  36 minutes.  CONSULTS:  None.  BRIEF HISTORY:  The patient is an 77 year old female with end-stage arthritis of the right knee, progressive worsening pain and dysfunction. She has had a successful left total knee, now presents for right total knee.  LABORATORY DATA:  CBC on admission, hemoglobin 15.3, hematocrit 47.3, white cell count 6.9, platelets 257.  PT/INR preop 29.8 with an INR of 2.78, with a PTT of 43.  The patient was on Coumadin.  Recheck the day of surgery, INR was down to normal level of 1.22.  Chem panel on admission all within normal limits.  Preop UA, moderate leukocytes, few squamous, 3-6 white cell, 0-2 red cells, few bacteria.  Blood group type O positive.  Nasal swabs were positive for Staphylococcus aureus, but they were negative for MRSA.  Serial CBCs were followed.  Hemoglobin dropped down to  11.5, drifted down.  Last hemoglobin 11.2 and hematocrit 33.7.  White count remained within normal limits.  Started back on Coumadin.  Serial pro times followed per Coumadin protocol.  Last PT/INR prior to discharge 28.9 and 2.67.  Serial BMETs were followed for 48 hours, did show a drop in potassium 4.1-3.4, back up to 4.1.  The remaining of BMETs within normal limits.  Chest x-ray 2-view chest dated May 24, 2011, stable exam, no active disease.  EKG dated May 24, 2011, electronic ventricular pacemaker, confirmed by Dr. Italy Hilty.  HOSPITAL COURSE:  The patient was admitted to [facility], taken to OR, underwent above-stated procedure without complication.  The patient tolerated procedure well, later transferred to recovery room on orthopedic floor, started on p.o. and IV analgesic pain control following surgery, given 24 hours postop IV antibiotics.  She was on chronic Coumadin, so she was started back on her Coumadin postoperatively.  She was also given Lovenox bridge until her Coumadin was therapeutic.  She was doing pretty well on the morning of day #1, although had a rough night.  On the evening before, she has had a little bit of drop in her potassium, so we added potassium supplement.  She knew she wanted to look into skilled facility.  She wanted look into [place], so we got social work involved.  Hemovac  drain placed during the surgery was pulled on day #1.  Started getting up out of bed.  By day #2, she was doing a little bit better, but still requiring moderate assist, so it was felt she would be a good candidate for inpatient rehab.  Hemoglobin was stable.  Dressing changed.  She had a little bit of bruising in and around the incision, but there was no drainage, no signs of infection.  Continued to progress with therapy slowly.  By day #3, she was seen in rounds by Dr. Lequita Halt.  She was doing well.  Pain was under better control.  She was going to be  transferred out that time.  DISCHARGE PLAN: 1. The patient discharged to [facility] on 06/09/2011. 2. Discharge diagnoses, please see above.  DISCHARGE MEDICATIONS:  Current medications at time of transfer include: 1. Xalatan eye drops 0.005% ophthalmic solution OU daily. 2. Aricept 5 mg p.o. q.h.s. 3. Wellbutrin 300 mg XL p.o. daily. 4. Triamterene/hydrochlorothiazide 37.5/25 mg p.o. every morning. 5. Atenolol 50 mg p.o. daily. 6. Amiodarone 200 mg p.o. daily. 7. Coumadin protocol.  Please titrate the Coumadin level for target     INR between 2.0 and 3.0.  She needs to be on Coumadin for 3 weeks     from the date of surgery for her DVT prophylaxis, then she may     resume her home dosage of 5 mg.  She takes 1 tablet on Sunday and     Wednesday, then she takes a 1/2 tablet on Monday, Tuesday,     Thursday, Friday, Saturday. 8. Colace 100 mg p.o. b.i.d. 9. Omeprazole 20 mg daily. 10.Potassium 10 mEq p.o. daily. 11.Artificial tears 1 drop OP daily p.r.n. 12.Robaxin 500 mg p.o. q.6-8 h. p.r.n. spasm. 13.OxyIR 5 mg 1 or 2 every 4-6 hours as needed for moderate pain. 14.Tylenol 325 mg 1 or 2 every 4-6 hours as needed for mild pain,     temperature, or headache. 15.Laxative of choice. 16.Enema of choice.  DIET:  Heart-healthy diet.  ACTIVITY:  Weightbearing as tolerated, total knee protocol.  Continue PT and OT for gait training, ambulation, ADLs, range of motion, and strengthening exercises.  Please note, she may start showering once she is transferred to [facility]; however, do not submerge the incision under water.  Daily dressing change to the knee incision.  FOLLOWUP:  She need to followed by with Dr. Lequita Halt in the office 2 weeks from date of surgery.  DISPOSITION:  [facility] skilled facility.  CONDITION UPON DISCHARGE:  Improving.     Alexzandrew L. Julien Girt, P.A.C.   ______________________________ Ollen Gross, M.D.    ALP/MEDQ  D:  06/09/2011  T:   06/09/2011  Job:  [id number]  cc:   Peter M. [place], M.D. Fax: [phone]  Gaspar Garbe, M.D. Fax: [phone]  [facility]  Electronically Signed by Patrica Duel P.A.C. on 06/09/2011 10:59:10 AM Electronically Signed by Ollen Gross M.D. on 06/10/2011 06:05:15 PM

## 2011-06-20 NOTE — Telephone Encounter (Signed)
No note required for this encounter.

## 2011-06-23 ENCOUNTER — Ambulatory Visit (INDEPENDENT_AMBULATORY_CARE_PROVIDER_SITE_OTHER): Payer: Self-pay | Admitting: Internal Medicine

## 2011-06-23 LAB — POCT INR: INR: 4.5

## 2011-06-27 ENCOUNTER — Ambulatory Visit (INDEPENDENT_AMBULATORY_CARE_PROVIDER_SITE_OTHER): Payer: Self-pay | Admitting: Cardiology

## 2011-06-27 DIAGNOSIS — R0989 Other specified symptoms and signs involving the circulatory and respiratory systems: Secondary | ICD-10-CM

## 2011-06-27 LAB — POCT INR: INR: 1.5

## 2011-06-29 ENCOUNTER — Telehealth: Payer: Self-pay | Admitting: Cardiology

## 2011-06-29 NOTE — Telephone Encounter (Signed)
Called to cx her app for tomorrow 8/9 for INR; states she just had knee repl and HHN is coming out. She drew lab Monday and sent to [facility]. Will be monitored by Coumadin clinic

## 2011-06-29 NOTE — Telephone Encounter (Signed)
Pt calling about appt she is having tomorrow and has questions, please call pt back about what she is having done, chart pulled in box

## 2011-06-30 ENCOUNTER — Encounter: Payer: Medicare Other | Admitting: *Deleted

## 2011-07-04 ENCOUNTER — Ambulatory Visit (INDEPENDENT_AMBULATORY_CARE_PROVIDER_SITE_OTHER): Payer: Self-pay | Admitting: Internal Medicine

## 2011-07-04 DIAGNOSIS — R0989 Other specified symptoms and signs involving the circulatory and respiratory systems: Secondary | ICD-10-CM

## 2011-07-11 ENCOUNTER — Ambulatory Visit (INDEPENDENT_AMBULATORY_CARE_PROVIDER_SITE_OTHER): Payer: Medicare Other | Admitting: *Deleted

## 2011-07-11 DIAGNOSIS — I4891 Unspecified atrial fibrillation: Secondary | ICD-10-CM

## 2011-07-11 DIAGNOSIS — Z7901 Long term (current) use of anticoagulants: Secondary | ICD-10-CM

## 2011-07-11 LAB — POCT INR: INR: 3.7

## 2011-07-11 MED ORDER — WARFARIN SODIUM 2.5 MG PO TABS: 2.5000 mg | ORAL_TABLET | Freq: Every day | ORAL | Status: DC

## 2011-07-18 ENCOUNTER — Encounter: Payer: Self-pay | Admitting: Cardiology

## 2011-07-22 ENCOUNTER — Ambulatory Visit: Payer: Medicare Other | Admitting: Cardiology

## 2011-07-22 ENCOUNTER — Other Ambulatory Visit: Payer: Medicare Other | Admitting: *Deleted

## 2011-07-27 ENCOUNTER — Ambulatory Visit (INDEPENDENT_AMBULATORY_CARE_PROVIDER_SITE_OTHER): Payer: Medicare Other | Admitting: *Deleted

## 2011-07-27 DIAGNOSIS — I4891 Unspecified atrial fibrillation: Secondary | ICD-10-CM

## 2011-07-27 DIAGNOSIS — Z7901 Long term (current) use of anticoagulants: Secondary | ICD-10-CM

## 2011-07-27 LAB — POCT INR: INR: 1.2

## 2011-07-29 ENCOUNTER — Emergency Department (HOSPITAL_COMMUNITY): Payer: Medicare Other

## 2011-07-29 ENCOUNTER — Emergency Department (HOSPITAL_COMMUNITY)
Admission: EM | Admit: 2011-07-29 | Discharge: 2011-07-29 | Disposition: A | Discharge status: Home / Self Care | Payer: Medicare Other | Attending: Emergency Medicine | Admitting: Emergency Medicine

## 2011-07-29 DIAGNOSIS — Z7901 Long term (current) use of anticoagulants: Secondary | ICD-10-CM | POA: Insufficient documentation

## 2011-07-29 DIAGNOSIS — W010XXA Fall on same level from slipping, tripping and stumbling without subsequent striking against object, initial encounter: Secondary | ICD-10-CM | POA: Insufficient documentation

## 2011-07-29 DIAGNOSIS — IMO0001 Reserved for inherently not codable concepts without codable children: Secondary | ICD-10-CM | POA: Insufficient documentation

## 2011-07-29 DIAGNOSIS — S7000XA Contusion of unspecified hip, initial encounter: Secondary | ICD-10-CM | POA: Insufficient documentation

## 2011-07-29 DIAGNOSIS — M25559 Pain in unspecified hip: Secondary | ICD-10-CM | POA: Insufficient documentation

## 2011-07-29 DIAGNOSIS — I10 Essential (primary) hypertension: Secondary | ICD-10-CM | POA: Insufficient documentation

## 2011-07-29 DIAGNOSIS — M129 Arthropathy, unspecified: Secondary | ICD-10-CM | POA: Insufficient documentation

## 2011-07-29 DIAGNOSIS — K219 Gastro-esophageal reflux disease without esophagitis: Secondary | ICD-10-CM | POA: Insufficient documentation

## 2011-07-29 DIAGNOSIS — Z950 Presence of cardiac pacemaker: Secondary | ICD-10-CM | POA: Insufficient documentation

## 2011-07-29 DIAGNOSIS — Z9889 Other specified postprocedural states: Secondary | ICD-10-CM | POA: Insufficient documentation

## 2011-07-31 ENCOUNTER — Emergency Department (HOSPITAL_COMMUNITY)
Admission: EM | Admit: 2011-07-31 | Discharge: 2011-08-01 | Disposition: A | Discharge status: Home / Self Care | Payer: Medicare Other | Attending: Emergency Medicine | Admitting: Emergency Medicine

## 2011-07-31 ENCOUNTER — Emergency Department (HOSPITAL_COMMUNITY): Payer: Medicare Other

## 2011-07-31 DIAGNOSIS — Z79899 Other long term (current) drug therapy: Secondary | ICD-10-CM | POA: Insufficient documentation

## 2011-07-31 DIAGNOSIS — Z950 Presence of cardiac pacemaker: Secondary | ICD-10-CM | POA: Insufficient documentation

## 2011-07-31 DIAGNOSIS — R4789 Other speech disturbances: Secondary | ICD-10-CM | POA: Insufficient documentation

## 2011-07-31 DIAGNOSIS — Z7901 Long term (current) use of anticoagulants: Secondary | ICD-10-CM | POA: Insufficient documentation

## 2011-07-31 DIAGNOSIS — K219 Gastro-esophageal reflux disease without esophagitis: Secondary | ICD-10-CM | POA: Insufficient documentation

## 2011-07-31 DIAGNOSIS — S8000XA Contusion of unspecified knee, initial encounter: Secondary | ICD-10-CM | POA: Insufficient documentation

## 2011-07-31 DIAGNOSIS — H409 Unspecified glaucoma: Secondary | ICD-10-CM | POA: Insufficient documentation

## 2011-07-31 DIAGNOSIS — W19XXXA Unspecified fall, initial encounter: Secondary | ICD-10-CM | POA: Insufficient documentation

## 2011-07-31 DIAGNOSIS — M129 Arthropathy, unspecified: Secondary | ICD-10-CM | POA: Insufficient documentation

## 2011-07-31 DIAGNOSIS — R51 Headache: Secondary | ICD-10-CM | POA: Insufficient documentation

## 2011-07-31 DIAGNOSIS — I10 Essential (primary) hypertension: Secondary | ICD-10-CM | POA: Insufficient documentation

## 2011-07-31 DIAGNOSIS — R5381 Other malaise: Secondary | ICD-10-CM | POA: Insufficient documentation

## 2011-07-31 DIAGNOSIS — F29 Unspecified psychosis not due to a substance or known physiological condition: Secondary | ICD-10-CM | POA: Insufficient documentation

## 2011-07-31 DIAGNOSIS — R5383 Other fatigue: Secondary | ICD-10-CM | POA: Insufficient documentation

## 2011-07-31 LAB — CBC
Hemoglobin: 13.1 g/dL (ref 12.0–15.0)
MCHC: 34.3 g/dL (ref 30.0–36.0)
RDW: 14 % (ref 11.5–15.5)
WBC: 6.9 10*3/uL (ref 4.0–10.5)

## 2011-07-31 LAB — BASIC METABOLIC PANEL
Chloride: 101 mEq/L (ref 96–112)
GFR calc Af Amer: 48 mL/min — ABNORMAL LOW (ref >60)
GFR calc non Af Amer: 40 mL/min — ABNORMAL LOW (ref >60)
Potassium: 3.5 mEq/L (ref 3.5–5.1)
Sodium: 135 mEq/L (ref 135–145)

## 2011-07-31 LAB — PROTIME-INR
INR: 3.03 — ABNORMAL HIGH (ref 0.00–1.49)
Prothrombin Time: 31.9 seconds — ABNORMAL HIGH (ref 11.6–15.2)

## 2011-08-01 LAB — URINALYSIS, ROUTINE W REFLEX MICROSCOPIC
Ketones, ur: NEGATIVE mg/dL
Leukocytes, UA: NEGATIVE
Nitrite: NEGATIVE
Protein, ur: NEGATIVE mg/dL
Urobilinogen, UA: 0.2 mg/dL (ref 0.0–1.0)

## 2011-08-02 ENCOUNTER — Encounter (INDEPENDENT_AMBULATORY_CARE_PROVIDER_SITE_OTHER): Payer: Medicare Other | Admitting: *Deleted

## 2011-08-02 ENCOUNTER — Ambulatory Visit (INDEPENDENT_AMBULATORY_CARE_PROVIDER_SITE_OTHER): Payer: Medicare Other | Admitting: *Deleted

## 2011-08-02 ENCOUNTER — Encounter: Payer: Self-pay | Admitting: Cardiology

## 2011-08-02 ENCOUNTER — Ambulatory Visit (INDEPENDENT_AMBULATORY_CARE_PROVIDER_SITE_OTHER): Payer: Medicare Other | Admitting: Cardiology

## 2011-08-02 VITALS — BP 108/70 | HR 80 | Wt 171.4 lb

## 2011-08-02 DIAGNOSIS — I495 Sick sinus syndrome: Secondary | ICD-10-CM

## 2011-08-02 DIAGNOSIS — Z7901 Long term (current) use of anticoagulants: Secondary | ICD-10-CM

## 2011-08-02 DIAGNOSIS — I4891 Unspecified atrial fibrillation: Secondary | ICD-10-CM

## 2011-08-02 DIAGNOSIS — R7989 Other specified abnormal findings of blood chemistry: Secondary | ICD-10-CM | POA: Insufficient documentation

## 2011-08-02 DIAGNOSIS — I10 Essential (primary) hypertension: Secondary | ICD-10-CM

## 2011-08-02 DIAGNOSIS — G459 Transient cerebral ischemic attack, unspecified: Secondary | ICD-10-CM

## 2011-08-02 DIAGNOSIS — R42 Dizziness and giddiness: Secondary | ICD-10-CM

## 2011-08-02 DIAGNOSIS — R3989 Other symptoms and signs involving the genitourinary system: Secondary | ICD-10-CM

## 2011-08-02 LAB — TSH: TSH: 2.34 u[IU]/mL (ref 0.35–5.50)

## 2011-08-02 NOTE — Assessment & Plan Note (Signed)
Her blood work suggests that she is dehydrated. I recommended she increase her fluid intake. We will stop her Dyazide.

## 2011-08-02 NOTE — Assessment & Plan Note (Signed)
Recent ER presentation with dizziness and forgetfulness. Question of a possible TIA. CT of the head was negative. Lab work was reviewed and was unremarkable. We'll schedule her for carotid Doppler studies and a followup echocardiogram. She will remain on her Coumadin therapy.

## 2011-08-02 NOTE — Progress Notes (Signed)
Verna Czech Date of Birth: 07/23/1928   History of Present Illness: Mrs. Tusing is seen today for followup of her sick sinus syndrome. She has done well from a cardiac standpoint with only occasional shortness of breath. She denies any palpitations or dizziness. She did suffer a fall this past Friday and didn't have getting the x-rays of her hip and knee and a CT scan of her knee. On Sunday she complained of dizziness and headache and had some forgetfulness. She went back to the emergency room and CT scan of the head was unremarkable. It was felt that she might have had a TIA. Her BUN and creatinine were increased consistent with some prerenal azotemia. 6 weeks ago she did have a right total knee replacement. Since that time she really hasn't been eating very well.  Current Outpatient Prescriptions on File Prior to Visit  Medication Sig Dispense Refill  . alendronate (FOSAMAX) 70 MG tablet Take 70 mg by mouth every 7 (seven) days. Take with a full glass of water on an empty stomach.       Marland Kitchen amiodarone (PACERONE) 200 MG tablet Take 200 mg by mouth daily.        Marland Kitchen atenolol (TENORMIN) 50 MG tablet Take 50 mg by mouth daily.        Marland Kitchen buPROPion (WELLBUTRIN XL) 300 MG 24 hr tablet Take 300 mg by mouth daily.        Tery Sanfilippo Calcium (STOOL SOFTENER PO) Take by mouth as needed.        . donepezil (ARICEPT) 5 MG tablet Take 5 mg by mouth at bedtime.        Marland Kitchen omeprazole (PRILOSEC) 20 MG capsule Take 20 mg by mouth daily.        . potassium chloride (K-DUR,KLOR-CON) 10 MEQ tablet Take 10 mEq by mouth daily.        Marland Kitchen triamterene-hydrochlorothiazide (DYAZIDE) 37.5-25 MG per capsule Take 1 capsule by mouth every morning.        . warfarin (COUMADIN) 2.5 MG tablet Take 1 tablet (2.5 mg total) by mouth daily.  90 tablet  0  . hydrochlorothiazide 25 MG tablet Take 25 mg by mouth daily.        Marland Kitchen DISCONTD: latanoprost (XALATAN) 0.005 % ophthalmic solution 1 drop at bedtime.         Allergies  Allergen  Reactions  . Codeine   . Iodine     Past Medical History  Diagnosis Date  . Anxiety   . Coronary artery disease   . Depression   . Hypertension   . Tachy-brady syndrome   . PAF (paroxysmal atrial fibrillation)   . HX: anticoagulation   . Glaucoma   . Dizziness   . SSS (sick sinus syndrome)   . GERD (gastroesophageal reflux disease)   . Fall   . Osteoarthritis     Past Surgical History  Procedure Date  . Insert / replace / remove pacemaker 01/24/2008    DDD PACEMAKER IMPLANT  . Cholecystectomy   . Gastric bypass   . Breast reduction surgery   . US echocardiography 08/10/2007    EF 60%  . US echocardiography 01/12/2007    EF 70%  . Cardiovascular stress test 02/15/2007  . Total knee arthroplasty     right    History  Smoking status  . Former Smoker  . Quit date: 07/17/1961  Smokeless tobacco  . Not on file    History  Alcohol Use No  Family History  Problem Relation Age of Onset  . Kidney failure Mother   . Heart attack Father   . Heart attack Sister   . Heart attack Brother     Review of Systems: As noted in history of present illness..  All other systems were reviewed and are negative.  Physical Exam: BP 108/70  Pulse 80  Wt 171 lb 6.4 oz (77.747 kg) She is an elderly white female in no acute distress. Her HEENT exam is unremarkable. Pupils are equal round and reactive. Sclera are clear. Oropharynx is clear. Neck is supple no JVD, adenopathy, thyromegaly, or bruits. Lungs are clear. Cardiac exam reveals a regular rate and really rhythm without gallop, murmur, or click. Abdomen is obese and nontender. There are no masses or bruits. She has no edema. Pedal pulses are good. Skin is warm and dry. Neurologic exam is nonfocal. It is noted that she has lost 21 pounds since last December. LABORATORY DATA:   Assessment / Plan:

## 2011-08-02 NOTE — Patient Instructions (Addendum)
Stop dyazide (triamterene-HCTZ)  Drink plenty of fluids.  We will check an INR in 2 weeks  We will check your lipids and thyroid study today.  I will see you again in 6 months.  We will schedule you for an Echocardiogram and carotid dopplers.

## 2011-08-02 NOTE — Assessment & Plan Note (Signed)
Rate appears to be well controlled and she is asymptomatic. She is on chronic anticoagulant therapy. We'll recheck an INR are in 2 weeks. She was therapeutic in the emergency room with an INR of 3.0.

## 2011-08-03 ENCOUNTER — Ambulatory Visit (HOSPITAL_COMMUNITY): Payer: Medicare Other | Attending: Cardiology

## 2011-08-03 DIAGNOSIS — I059 Rheumatic mitral valve disease, unspecified: Secondary | ICD-10-CM | POA: Insufficient documentation

## 2011-08-03 DIAGNOSIS — I10 Essential (primary) hypertension: Secondary | ICD-10-CM | POA: Insufficient documentation

## 2011-08-03 DIAGNOSIS — I4891 Unspecified atrial fibrillation: Secondary | ICD-10-CM | POA: Insufficient documentation

## 2011-08-03 DIAGNOSIS — G459 Transient cerebral ischemic attack, unspecified: Secondary | ICD-10-CM

## 2011-08-03 DIAGNOSIS — I079 Rheumatic tricuspid valve disease, unspecified: Secondary | ICD-10-CM | POA: Insufficient documentation

## 2011-08-03 DIAGNOSIS — I495 Sick sinus syndrome: Secondary | ICD-10-CM | POA: Insufficient documentation

## 2011-08-03 LAB — LIPID PANEL: HDL: 61.9 mg/dL (ref >39.00)

## 2011-08-04 ENCOUNTER — Other Ambulatory Visit (INDEPENDENT_AMBULATORY_CARE_PROVIDER_SITE_OTHER): Payer: Medicare Other | Admitting: *Deleted

## 2011-08-04 ENCOUNTER — Telehealth: Payer: Self-pay | Admitting: *Deleted

## 2011-08-04 DIAGNOSIS — G459 Transient cerebral ischemic attack, unspecified: Secondary | ICD-10-CM

## 2011-08-04 NOTE — Telephone Encounter (Signed)
Notified of lab results. Will send to Dr. Wylene Simmer

## 2011-08-04 NOTE — Telephone Encounter (Signed)
Message copied by Lorayne Bender on Thu Aug 04, 2011 11:01 AM ------      Message from: [place], PETER M      Created: Wed Aug 03, 2011 12:39 PM       Thyroid is normal. Lipids a little high. Watch diet and keep weight down.      Theron Arista [place]

## 2011-08-05 ENCOUNTER — Telehealth: Payer: Self-pay | Admitting: *Deleted

## 2011-08-05 NOTE — Telephone Encounter (Signed)
Notified of Echo results. Will send to Dr. Wylene Simmer

## 2011-08-05 NOTE — Telephone Encounter (Signed)
Message copied by Lorayne Bender on Fri Aug 05, 2011  4:56 PM ------      Message from: [place], PETER M      Created: Thu Aug 04, 2011 12:19 PM       Normal LV function.      Moderate biatrial enlargement. Mod-severe TR with mild pulmonary HTN      No source of embolus seen.      Theron Arista [place]

## 2011-08-08 NOTE — Procedures (Unsigned)
CAROTID DUPLEX EXAM  INDICATION:  TIA.  HISTORY: Diabetes:  No. Cardiac:  No. Hypertension:  No. Smoking:  Previous. Previous Surgery:  No. CV History:  Currently asymptomatic. Amaurosis [place] No, Paresthesias No, Hemiparesis No.                                      RIGHT             LEFT Brachial systolic pressure:         112               110 Brachial Doppler waveforms:         Normal            Normal Vertebral direction of flow:        Antegrade         Antegrade DUPLEX VELOCITIES (cm/sec) CCA peak systolic                   60                63 ECA peak systolic                   70                50 ICA peak systolic                   31                36 ICA end diastolic                   9                 12 PLAQUE MORPHOLOGY: PLAQUE AMOUNT:                      None              None PLAQUE LOCATION:  IMPRESSION:  No evidence of stenosis noted in the bilateral carotid arteries with no plaque formations visualized.  ___________________________________________ V. Charlena Cross, MD  CH/MEDQ  D:  08/04/2011  T:  08/04/2011  Job:  [id number]

## 2011-08-11 ENCOUNTER — Telehealth: Payer: Self-pay | Admitting: Cardiology

## 2011-08-11 NOTE — Telephone Encounter (Signed)
Pt calling regarding carotid Doppler results. Pt would like to know results of that procedure. Please return call to discuss further.

## 2011-08-12 NOTE — Telephone Encounter (Signed)
Notified daughter the carotid doppler report. Daughter wants to know what caused the dizziness. Dr. [place] states probably due to low BP. Dizziness should resolve since stopping BP meds. Daughter states she has not had any dizziness this past week. Advised to monitor BP and call us if has elevated pressures.

## 2011-08-16 ENCOUNTER — Ambulatory Visit (INDEPENDENT_AMBULATORY_CARE_PROVIDER_SITE_OTHER): Payer: Medicare Other | Admitting: *Deleted

## 2011-08-16 DIAGNOSIS — I4891 Unspecified atrial fibrillation: Secondary | ICD-10-CM

## 2011-08-16 DIAGNOSIS — Z7901 Long term (current) use of anticoagulants: Secondary | ICD-10-CM

## 2011-08-24 ENCOUNTER — Ambulatory Visit (INDEPENDENT_AMBULATORY_CARE_PROVIDER_SITE_OTHER): Payer: Medicare Other | Admitting: *Deleted

## 2011-08-24 ENCOUNTER — Encounter: Payer: Self-pay | Admitting: Internal Medicine

## 2011-08-24 DIAGNOSIS — Z7901 Long term (current) use of anticoagulants: Secondary | ICD-10-CM

## 2011-08-24 DIAGNOSIS — I4891 Unspecified atrial fibrillation: Secondary | ICD-10-CM

## 2011-08-24 DIAGNOSIS — I495 Sick sinus syndrome: Secondary | ICD-10-CM

## 2011-08-24 LAB — PACEMAKER DEVICE OBSERVATION
AL AMPLITUDE: 1.4 mv
AL IMPEDENCE PM: 352 Ohm
BAMS-0001: 170 {beats}/min
RV LEAD AMPLITUDE: 4.9 mv
RV LEAD IMPEDENCE PM: 400 Ohm
RV LEAD THRESHOLD: 1 V

## 2011-08-24 LAB — POCT INR: INR: 3.6

## 2011-08-24 NOTE — Progress Notes (Signed)
PPM check 

## 2011-09-02 ENCOUNTER — Encounter: Payer: Medicare Other | Admitting: *Deleted

## 2011-09-05 ENCOUNTER — Ambulatory Visit (INDEPENDENT_AMBULATORY_CARE_PROVIDER_SITE_OTHER): Payer: Medicare Other | Admitting: *Deleted

## 2011-09-05 DIAGNOSIS — Z7901 Long term (current) use of anticoagulants: Secondary | ICD-10-CM

## 2011-09-05 DIAGNOSIS — I4891 Unspecified atrial fibrillation: Secondary | ICD-10-CM

## 2011-09-05 LAB — POCT INR: INR: 1.3

## 2011-09-08 LAB — BASIC METABOLIC PANEL
BUN: 23
CO2: 27
CO2: 27
Calcium: 9.3
Calcium: 9.6
Calcium: 9.8
Chloride: 105
Creatinine, Ser: 0.96
Creatinine, Ser: 1.06
GFR calc Af Amer: 51 — ABNORMAL LOW
GFR calc Af Amer: >60
GFR calc non Af Amer: 50 — ABNORMAL LOW
GFR calc non Af Amer: 56 — ABNORMAL LOW
Glucose, Bld: 94
Glucose, Bld: 95
Potassium: 4.4
Sodium: 138
Sodium: 140

## 2011-09-08 LAB — PROTIME-INR
INR: 2.5 — ABNORMAL HIGH
INR: 3 — ABNORMAL HIGH
Prothrombin Time: 30.4 — ABNORMAL HIGH
Prothrombin Time: 32.6 — ABNORMAL HIGH
Prothrombin Time: 35.4 — ABNORMAL HIGH

## 2011-09-08 LAB — CBC
HCT: 48.4 — ABNORMAL HIGH
Hemoglobin: 16.5 — ABNORMAL HIGH
MCHC: 34.1
MCHC: 34.3
MCV: 91.2
Platelets: 290
RBC: 5.27 — ABNORMAL HIGH
RDW: 14.1 — ABNORMAL HIGH
RDW: 14.2 — ABNORMAL HIGH
WBC: 5.4

## 2011-09-08 LAB — APTT: aPTT: 37

## 2011-09-12 ENCOUNTER — Other Ambulatory Visit: Payer: Self-pay | Admitting: Cardiology

## 2011-09-12 MED ORDER — WARFARIN SODIUM 2.5 MG PO TABS: ORAL_TABLET | ORAL | Status: DC

## 2011-09-15 ENCOUNTER — Ambulatory Visit (INDEPENDENT_AMBULATORY_CARE_PROVIDER_SITE_OTHER): Payer: Medicare Other | Admitting: *Deleted

## 2011-09-15 DIAGNOSIS — I4891 Unspecified atrial fibrillation: Secondary | ICD-10-CM

## 2011-09-15 DIAGNOSIS — Z7901 Long term (current) use of anticoagulants: Secondary | ICD-10-CM

## 2011-09-15 LAB — POCT INR: INR: 2.1

## 2011-10-06 ENCOUNTER — Ambulatory Visit (INDEPENDENT_AMBULATORY_CARE_PROVIDER_SITE_OTHER): Payer: Medicare Other | Admitting: *Deleted

## 2011-10-06 DIAGNOSIS — I4891 Unspecified atrial fibrillation: Secondary | ICD-10-CM

## 2011-10-06 DIAGNOSIS — Z7901 Long term (current) use of anticoagulants: Secondary | ICD-10-CM

## 2011-10-06 LAB — POCT INR: INR: 5.7

## 2011-10-19 ENCOUNTER — Ambulatory Visit (INDEPENDENT_AMBULATORY_CARE_PROVIDER_SITE_OTHER): Payer: Medicare Other | Admitting: *Deleted

## 2011-10-19 DIAGNOSIS — I4891 Unspecified atrial fibrillation: Secondary | ICD-10-CM

## 2011-10-19 DIAGNOSIS — Z7901 Long term (current) use of anticoagulants: Secondary | ICD-10-CM

## 2011-10-19 LAB — POCT INR: INR: 3.2

## 2011-11-01 ENCOUNTER — Emergency Department (HOSPITAL_COMMUNITY)
Admission: EM | Admit: 2011-11-01 | Discharge: 2011-11-01 | Disposition: A | Discharge status: Home / Self Care | Payer: Medicare Other | Attending: Emergency Medicine | Admitting: Emergency Medicine

## 2011-11-01 ENCOUNTER — Encounter (HOSPITAL_COMMUNITY): Payer: Self-pay | Admitting: *Deleted

## 2011-11-01 ENCOUNTER — Emergency Department (HOSPITAL_COMMUNITY): Payer: Medicare Other

## 2011-11-01 DIAGNOSIS — Z79899 Other long term (current) drug therapy: Secondary | ICD-10-CM | POA: Insufficient documentation

## 2011-11-01 DIAGNOSIS — S61209A Unspecified open wound of unspecified finger without damage to nail, initial encounter: Secondary | ICD-10-CM | POA: Insufficient documentation

## 2011-11-01 DIAGNOSIS — S0083XA Contusion of other part of head, initial encounter: Secondary | ICD-10-CM

## 2011-11-01 DIAGNOSIS — S62619B Displaced fracture of proximal phalanx of unspecified finger, initial encounter for open fracture: Secondary | ICD-10-CM

## 2011-11-01 DIAGNOSIS — IMO0002 Reserved for concepts with insufficient information to code with codable children: Secondary | ICD-10-CM | POA: Insufficient documentation

## 2011-11-01 DIAGNOSIS — R51 Headache: Secondary | ICD-10-CM | POA: Insufficient documentation

## 2011-11-01 DIAGNOSIS — R220 Localized swelling, mass and lump, head: Secondary | ICD-10-CM | POA: Insufficient documentation

## 2011-11-01 DIAGNOSIS — M79609 Pain in unspecified limb: Secondary | ICD-10-CM | POA: Insufficient documentation

## 2011-11-01 DIAGNOSIS — Z7901 Long term (current) use of anticoagulants: Secondary | ICD-10-CM | POA: Insufficient documentation

## 2011-11-01 DIAGNOSIS — I4891 Unspecified atrial fibrillation: Secondary | ICD-10-CM | POA: Insufficient documentation

## 2011-11-01 DIAGNOSIS — S0003XA Contusion of scalp, initial encounter: Secondary | ICD-10-CM | POA: Insufficient documentation

## 2011-11-01 DIAGNOSIS — M1990 Unspecified osteoarthritis, unspecified site: Secondary | ICD-10-CM | POA: Insufficient documentation

## 2011-11-01 DIAGNOSIS — M7989 Other specified soft tissue disorders: Secondary | ICD-10-CM | POA: Insufficient documentation

## 2011-11-01 DIAGNOSIS — S61219A Laceration without foreign body of unspecified finger without damage to nail, initial encounter: Secondary | ICD-10-CM

## 2011-11-01 DIAGNOSIS — S1093XA Contusion of unspecified part of neck, initial encounter: Secondary | ICD-10-CM | POA: Insufficient documentation

## 2011-11-01 DIAGNOSIS — F341 Dysthymic disorder: Secondary | ICD-10-CM | POA: Insufficient documentation

## 2011-11-01 DIAGNOSIS — R269 Unspecified abnormalities of gait and mobility: Secondary | ICD-10-CM | POA: Insufficient documentation

## 2011-11-01 DIAGNOSIS — S01501A Unspecified open wound of lip, initial encounter: Secondary | ICD-10-CM | POA: Insufficient documentation

## 2011-11-01 DIAGNOSIS — W19XXXA Unspecified fall, initial encounter: Secondary | ICD-10-CM | POA: Insufficient documentation

## 2011-11-01 DIAGNOSIS — I2510 Atherosclerotic heart disease of native coronary artery without angina pectoris: Secondary | ICD-10-CM | POA: Insufficient documentation

## 2011-11-01 DIAGNOSIS — K219 Gastro-esophageal reflux disease without esophagitis: Secondary | ICD-10-CM | POA: Insufficient documentation

## 2011-11-01 DIAGNOSIS — I10 Essential (primary) hypertension: Secondary | ICD-10-CM | POA: Insufficient documentation

## 2011-11-01 DIAGNOSIS — H409 Unspecified glaucoma: Secondary | ICD-10-CM | POA: Insufficient documentation

## 2011-11-01 DIAGNOSIS — F29 Unspecified psychosis not due to a substance or known physiological condition: Secondary | ICD-10-CM | POA: Insufficient documentation

## 2011-11-01 MED ORDER — HYDROCODONE-ACETAMINOPHEN 5-325 MG PO TABS: 1.0000 | ORAL_TABLET | Freq: Four times a day (QID) | ORAL | Status: AC | PRN

## 2011-11-01 MED ORDER — BUPIVACAINE HCL 0.5 % IJ SOLN
5.0000 mL | INTRAMUSCULAR | Status: DC
  Filled 2011-11-01: qty 5

## 2011-11-01 MED ORDER — BUPIVACAINE HCL (PF) 0.5 % IJ SOLN
INTRAMUSCULAR | Status: AC
  Filled 2011-11-01: qty 10

## 2011-11-01 MED ORDER — CEFAZOLIN SODIUM 1 G IJ SOLR
1.0000 g | Freq: Once | INTRAMUSCULAR | Status: AC
  Administered 2011-11-01: 1 g via INTRAMUSCULAR
  Filled 2011-11-01: qty 10

## 2011-11-01 MED ORDER — CEPHALEXIN 500 MG PO CAPS: 500.0000 mg | ORAL_CAPSULE | Freq: Four times a day (QID) | ORAL | Status: AC

## 2011-11-01 NOTE — ED Provider Notes (Signed)
History     CSN: [id number] Arrival date & time: 11/01/2011  5:11 PM   First MD Initiated Contact with Patient 11/01/11 1802      Chief Complaint  Patient presents with  . Fall    (Consider location/radiation/quality/duration/timing/severity/associated sxs/prior treatment) Patient is a 77 y.o. female presenting with fall. The history is provided by the patient and a relative.  Fall The accident occurred 1 to 2 hours ago. The fall occurred while walking. Distance fallen: From standing. The volume of blood lost was minimal. The point of impact was the head (Right hand). Pain location: Right hand, lower lip. The pain is moderate. Pertinent negatives include no fever, no numbness, no abdominal pain, no nausea, no vomiting and no hematuria. Associated symptoms comments: Unknown loss of consciousness. She has tried nothing for the symptoms.   patient with a history of falls presents with an unwitnessed fall that occurred just prior to arrival this evening. Daughter reports she was upstairs and heard the sound of her mother following and after a short delay her to call for help. She reports that she found her mother face down on the ground on top of her walker and it appeared she had struck her head on the bed. The patient does not recall falling.  Past Medical History  Diagnosis Date  . Anxiety   . Coronary artery disease   . Depression   . Hypertension   . Tachy-brady syndrome   . PAF (paroxysmal atrial fibrillation)   . HX: anticoagulation   . Glaucoma   . Dizziness   . SSS (sick sinus syndrome)   . GERD (gastroesophageal reflux disease)   . Fall   . Osteoarthritis     Past Surgical History  Procedure Date  . Insert / replace / remove pacemaker 01/24/2008    DDD PACEMAKER IMPLANT  . Cholecystectomy   . Gastric bypass   . Breast reduction surgery   . US echocardiography 08/10/2007    EF 60%  . US echocardiography 01/12/2007    EF 70%  . Cardiovascular stress test 02/15/2007  .  Total knee arthroplasty     right    Family History  Problem Relation Age of Onset  . Kidney failure Mother   . Heart attack Father   . Heart attack Sister   . Heart attack Brother     History  Substance Use Topics  . Smoking status: Former Smoker    Quit date: 07/17/1961  . Smokeless tobacco: Not on file  . Alcohol Use: No     Review of Systems  Constitutional: Negative for fever and chills.  HENT: Positive for facial swelling. Negative for ear pain, nosebleeds, neck pain and neck stiffness.        Positive mouth injury  Eyes: Negative for pain and visual disturbance.  Respiratory: Negative for cough and shortness of breath.   Cardiovascular: Negative for chest pain and leg swelling.  Gastrointestinal: Negative for nausea, vomiting and abdominal pain.  Genitourinary: Negative for dysuria and hematuria.  Musculoskeletal: Positive for joint swelling and gait problem.       Pain to right index finger  Skin: Positive for color change and wound.       Laceration to right index finger. Laceration to her lip. Abrasions to bilateral hands. Bruise to forehead  Neurological: Negative for numbness.       Unknown loss of consciousness  Psychiatric/Behavioral: Positive for confusion.       No change from baseline, per daughter  Allergies  Codeine and Iodine  Home Medications   Current Outpatient Rx  Name Route Sig Dispense Refill  . ALENDRONATE SODIUM 70 MG PO TABS Oral Take 70 mg by mouth every 7 (seven) days. Take with a full glass of water on an empty stomach.     . AMIODARONE HCL 200 MG PO TABS Oral Take 200 mg by mouth daily.      . ATENOLOL 50 MG PO TABS Oral Take 50 mg by mouth daily.      . BUPROPION HCL ER (XL) 300 MG PO TB24 Oral Take 300 mg by mouth daily.      . DONEPEZIL HCL 5 MG PO TABS Oral Take 10 mg by mouth at bedtime.     Marland Kitchen KLOR-CON 10 10 MEQ PO TBCR Oral Take 1 tablet by mouth Daily.    Marland Kitchen LATANOPROST 0.005 % OP SOLN Ophthalmic Apply 1 drop to eye at  bedtime.     . OMEPRAZOLE 20 MG PO CPDR Oral Take 20 mg by mouth daily.      Marland Kitchen POTASSIUM CHLORIDE CRYS CR 10 MEQ PO TBCR Oral Take 10 mEq by mouth daily.      Marland Kitchen VITAMIN D (ERGOCALCIFEROL) 50000 UNITS PO CAPS Oral Take 1 tablet by mouth Once a week.    . WARFARIN SODIUM 2.5 MG PO TABS Oral Take 2.5 mg by mouth daily. Take as directed by Anticoagulation Clinic.       BP 168/114  Pulse 76  Temp(Src) 98.1 F (36.7 C) (Oral)  Resp 18  SpO2 97%  Physical Exam  Constitutional: She appears well-developed and well-nourished.       Intermittently uncomfortable when right-hand moves or is touched but otherwise in no acute distress  HENT:  Head: Normocephalic.  Right Ear: External ear normal.  Left Ear: External ear normal.  Nose: Nose normal.  Mouth/Throat: Oropharynx is clear and moist.       There is ecchymosis with erythema and a small amount of swelling to the left forehead with mild tenderness to palpation. There is swelling to the right lower lip. There 3 approximately 2 mm each lacerations to the internal aspect of the right lower lip with no invasion of the vermilion border. There is no active bleeding. There are no fractured teeth. There is no tenderness to palpation of the supraorbital ridge, zygomatic arch, maxilla, or mandible. The TMJ is without tenderness or crepitus with movement.  Eyes: EOM are normal.       Pupils are small but equal and reactive  Neck: Normal range of motion. Neck supple.  Cardiovascular: Normal rate, regular rhythm and intact distal pulses.   Pulmonary/Chest: Effort normal and breath sounds normal. No respiratory distress. She exhibits no tenderness.  Abdominal: Soft. Bowel sounds are normal. She exhibits no distension. There is no tenderness.  Musculoskeletal:       Significant tenderness to the right second digit worst over the proximal phalanx where there is an overlying laceration. There is obvious deformity and significant swelling to the digit. The  patient will slightly flexed and extend the MCP joint of the affected digit but does not move at the PIP were PIP joints. Capillary refill is less than 2 seconds. All other joints are without tenderness or edema. Pelvis is stable and there is no leg shortening or rotation.  Neurological: She is alert.       Patient is oriented to person and place but not to time or preceding events. Finger to nose is  intact. Sensation is intact to light touch.  Skin: Skin is warm and dry.       There is a 2 cm irregular poorly approximated irregular laceration to the dorsum of the right second digit overlying the proximal phalanx. There is a small amount of active slow bleeding from the wound. There are no apparent foreign bodies. There is a 1 cm abrasion to the lateral dorsal right hand with no active bleeding. There are multiple abrasions to left hand with no active bleeding.    ED Course  LACERATION REPAIR Date/Time: 11/01/2011 9:10 PM Performed by: Lorenz Coaster JUSTICE Authorized by: Lorenz Coaster JUSTICE Consent: Verbal consent obtained. Risks and benefits: risks, benefits and alternatives were discussed Consent given by: patient (Patient and daughter give consent as the patient does have baseline intermittent dementia) Patient states understanding of procedure being performed: Daughter states understanding of the procedure being performed. Imaging studies: imaging studies available Patient identity confirmed: verbally with patient and arm band Time out: Immediately prior to procedure a "time out" was called to verify the correct patient, procedure, equipment, support staff and site/side marked as required. Location: Right proximal second digit, dorsal aspect. Laceration length: 2 cm Contamination: The wound is contaminated. Foreign bodies: no foreign bodies Tendon involvement: Laceration extends to the tendon but has an unstable fracture makes movement difficult i I am unable to determine if there  is actual tendon involvement. Nerve involvement: none Vascular damage: no Anesthesia: digital block Local anesthetic: bupivacaine 0.5% without epinephrine Anesthetic total (ml): Please see nerve block documentation as performed by Dr. Preston Fleeting. Patient sedated: no Preparation: Patient was prepped and draped in the usual sterile fashion. Irrigation solution: saline Irrigation method: syringe Amount of cleaning: extensive Debridement: none Skin closure: 5-0 nylon Number of sutures: 4 Technique: simple Approximation: loose Approximation difficulty: complex Dressing: Nonadhesive gauze. Patient tolerance: Patient tolerated the procedure well with no immediate complications.   (including critical care time)  Labs Reviewed - No data to display Ct Head Wo Contrast  11/01/2011  *RADIOLOGY REPORT*  Clinical Data:  Fall, facial lacerations.  CT HEAD WITHOUT CONTRAST CT CERVICAL SPINE WITHOUT CONTRAST  Technique:  Multidetector CT imaging of the head and cervical spine was performed following the standard protocol without intravenous contrast.  Multiplanar CT image reconstructions of the cervical spine were also generated.  Comparison:  07/31/2011  CT HEAD  Findings: There is atrophy and chronic small vessel disease changes. No acute intracranial abnormality.  Specifically, no hemorrhage, hydrocephalus, mass lesion, acute infarction, or significant intracranial injury.  No acute calvarial abnormality. Mild mucosal thickening in the paranasal sinuses.  Mastoids are clear.  IMPRESSION: No acute intracranial abnormality.  Atrophy, chronic microvascular disease.  CT CERVICAL SPINE  Findings: Diffuse degenerative disc disease and facet disease throughout the cervical spine.  Moderate to severe bilateral neural foraminal narrowing at multiple levels throughout the cervical spine.  No fracture or malalignment.  No epidural or paraspinal hematoma.  IMPRESSION: Severe spondylosis.  No acute findings.  Original  Report Authenticated By: Cyndie Chime, M.D.   Ct Cervical Spine Wo Contrast  11/01/2011  *RADIOLOGY REPORT*  Clinical Data:  Fall, facial lacerations.  CT HEAD WITHOUT CONTRAST CT CERVICAL SPINE WITHOUT CONTRAST  Technique:  Multidetector CT imaging of the head and cervical spine was performed following the standard protocol without intravenous contrast.  Multiplanar CT image reconstructions of the cervical spine were also generated.  Comparison:  07/31/2011  CT HEAD  Findings: There is atrophy and chronic small vessel disease  changes. No acute intracranial abnormality.  Specifically, no hemorrhage, hydrocephalus, mass lesion, acute infarction, or significant intracranial injury.  No acute calvarial abnormality. Mild mucosal thickening in the paranasal sinuses.  Mastoids are clear.  IMPRESSION: No acute intracranial abnormality.  Atrophy, chronic microvascular disease.  CT CERVICAL SPINE  Findings: Diffuse degenerative disc disease and facet disease throughout the cervical spine.  Moderate to severe bilateral neural foraminal narrowing at multiple levels throughout the cervical spine.  No fracture or malalignment.  No epidural or paraspinal hematoma.  IMPRESSION: Severe spondylosis.  No acute findings.  Original Report Authenticated By: Cyndie Chime, M.D.   Dg Hand Complete Right  11/01/2011  *RADIOLOGY REPORT*  Clinical Data: Fall, laceration, pain.  RIGHT HAND - COMPLETE 3+ VIEW  Comparison: None.  Findings: Comminuted fracture noted through the proximal phalanx of the right index finger.  Fracture fragments are mildly displaced and angulated.  No additional acute bony abnormality.  Advanced degenerative joint disease in the first carpometacarpal joint. Degenerative changes also in the PIP and DIP joints.  IMPRESSION: Comminuted, displaced, angulated fracture through the proximal phalanx of the right index finger.  Original Report Authenticated By: Cyndie Chime, M.D.     1. Fall   2. Open  fracture proximal phalanx finger   3. Laceration of finger of right hand   4. Forehead contusion       MDM  Patient with history of fall with what appears to be a mechanical fall this evening. Head and neck CT is negative for any acute findings. Right proximal phalanx fracture as noted in the x-ray reading above and reviewed by myself. Overlying laceration was loosely repaired. I spoke with Dr. Amanda Pea regarding the finger fracture and he will see the patient in the office tomorrow at 11 AM. The orthopedic technician has placed a splint on the injured finger for stabilization.        Elwyn Reach [place], [place] 11/01/11 2210

## 2011-11-01 NOTE — ED Notes (Signed)
To ed for eval after falling. Pt was walking with walker when daughter heard her fall. Found pt laying face down with walker underneath her. No loc. Bleeding controlled. Right hand deformity noted.

## 2011-11-01 NOTE — ED Notes (Signed)
Telfa and gauze dressing applied. Ortho tech at bedside to apply finger splint .

## 2011-11-01 NOTE — Progress Notes (Signed)
Orthopedic Tech Progress Note Patient Details:  Alison Dodson [birth year] [id number]  Type of Splint: Finger Splint Location: (R) UE Splint Interventions: Application    Jennye Moccasin 11/01/2011, 9:52 PM

## 2011-11-01 NOTE — Progress Notes (Signed)
77 year old female fell suffering a laceration to her right second finger. X-ray shows a comminuted fracture the proximal phalanx. The laceration is fairly extensive but neurovascular exam is intact. After a timeout wasn't obtained and patient identified by verbal identifying yourself in by her hospital wrist bracelet, a digital block was placed with 0.5% bupivacaine. Good anesthesia was obtained. The mid-level performed laceration repair, and she will be referred to Dr. Amanda Pea for followup.

## 2011-11-02 NOTE — ED Provider Notes (Signed)
I have personally performed and participated in all the services and procedures documented herein. I have reviewed the findings with the patient. Please see separate progress note.  Dione Booze, MD 11/02/11 [phone]

## 2011-11-09 ENCOUNTER — Ambulatory Visit (INDEPENDENT_AMBULATORY_CARE_PROVIDER_SITE_OTHER): Payer: Medicare Other | Admitting: *Deleted

## 2011-11-09 DIAGNOSIS — I4891 Unspecified atrial fibrillation: Secondary | ICD-10-CM

## 2011-11-09 DIAGNOSIS — Z7901 Long term (current) use of anticoagulants: Secondary | ICD-10-CM

## 2011-11-13 ENCOUNTER — Encounter (HOSPITAL_COMMUNITY): Payer: Self-pay

## 2011-11-13 ENCOUNTER — Emergency Department (HOSPITAL_COMMUNITY)
Admission: EM | Admit: 2011-11-13 | Discharge: 2011-11-13 | Disposition: A | Discharge status: Home / Self Care | Payer: Medicare Other | Attending: Emergency Medicine | Admitting: Emergency Medicine

## 2011-11-13 ENCOUNTER — Emergency Department (HOSPITAL_COMMUNITY): Payer: Medicare Other

## 2011-11-13 DIAGNOSIS — I495 Sick sinus syndrome: Secondary | ICD-10-CM | POA: Insufficient documentation

## 2011-11-13 DIAGNOSIS — Z9889 Other specified postprocedural states: Secondary | ICD-10-CM | POA: Insufficient documentation

## 2011-11-13 DIAGNOSIS — K219 Gastro-esophageal reflux disease without esophagitis: Secondary | ICD-10-CM | POA: Insufficient documentation

## 2011-11-13 DIAGNOSIS — Z79899 Other long term (current) drug therapy: Secondary | ICD-10-CM | POA: Insufficient documentation

## 2011-11-13 DIAGNOSIS — F341 Dysthymic disorder: Secondary | ICD-10-CM | POA: Insufficient documentation

## 2011-11-13 DIAGNOSIS — R1032 Left lower quadrant pain: Secondary | ICD-10-CM | POA: Insufficient documentation

## 2011-11-13 DIAGNOSIS — I10 Essential (primary) hypertension: Secondary | ICD-10-CM | POA: Insufficient documentation

## 2011-11-13 DIAGNOSIS — K6289 Other specified diseases of anus and rectum: Secondary | ICD-10-CM

## 2011-11-13 DIAGNOSIS — M1990 Unspecified osteoarthritis, unspecified site: Secondary | ICD-10-CM | POA: Insufficient documentation

## 2011-11-13 DIAGNOSIS — I2510 Atherosclerotic heart disease of native coronary artery without angina pectoris: Secondary | ICD-10-CM | POA: Insufficient documentation

## 2011-11-13 DIAGNOSIS — R10814 Left lower quadrant abdominal tenderness: Secondary | ICD-10-CM | POA: Insufficient documentation

## 2011-11-13 DIAGNOSIS — K5900 Constipation, unspecified: Secondary | ICD-10-CM | POA: Insufficient documentation

## 2011-11-13 DIAGNOSIS — Z7901 Long term (current) use of anticoagulants: Secondary | ICD-10-CM | POA: Insufficient documentation

## 2011-11-13 DIAGNOSIS — K589 Irritable bowel syndrome without diarrhea: Secondary | ICD-10-CM

## 2011-11-13 LAB — BASIC METABOLIC PANEL
BUN: 11 mg/dL (ref 6–23)
CO2: 30 mEq/L (ref 19–32)
Calcium: 10.1 mg/dL (ref 8.4–10.5)
Chloride: 102 mEq/L (ref 96–112)
Creatinine, Ser: 0.98 mg/dL (ref 0.50–1.10)
GFR calc Af Amer: 61 mL/min — ABNORMAL LOW (ref >90)
GFR calc non Af Amer: 53 mL/min — ABNORMAL LOW (ref >90)
Glucose, Bld: 92 mg/dL (ref 70–99)
Potassium: 3.5 mEq/L (ref 3.5–5.1)
Sodium: 141 mEq/L (ref 135–145)

## 2011-11-13 LAB — CBC
HCT: 38.8 % (ref 36.0–46.0)
Hemoglobin: 12.8 g/dL (ref 12.0–15.0)
MCH: 29.2 pg (ref 26.0–34.0)
MCHC: 33 g/dL (ref 30.0–36.0)
MCV: 88.4 fL (ref 78.0–100.0)
Platelets: 292 10*3/uL (ref 150–400)
RBC: 4.39 MIL/uL (ref 3.87–5.11)
RDW: 17.1 % — ABNORMAL HIGH (ref 11.5–15.5)
WBC: 7.9 10*3/uL (ref 4.0–10.5)

## 2011-11-13 LAB — URINALYSIS, ROUTINE W REFLEX MICROSCOPIC
Glucose, UA: NEGATIVE mg/dL
Hgb urine dipstick: NEGATIVE
Ketones, ur: 15 mg/dL — AB
Leukocytes, UA: NEGATIVE
Nitrite: NEGATIVE
Protein, ur: NEGATIVE mg/dL
Specific Gravity, Urine: 1.018 (ref 1.005–1.030)
Urobilinogen, UA: 0.2 mg/dL (ref 0.0–1.0)
pH: 7 (ref 5.0–8.0)

## 2011-11-13 MED ORDER — DICYCLOMINE HCL 20 MG PO TABS: 20.0000 mg | ORAL_TABLET | Freq: Two times a day (BID) | ORAL | Status: DC

## 2011-11-13 MED ORDER — ONDANSETRON HCL 4 MG/2ML IJ SOLN
4.0000 mg | Freq: Once | INTRAMUSCULAR | Status: AC
  Administered 2011-11-13: 4 mg via INTRAVENOUS
  Filled 2011-11-13: qty 2

## 2011-11-13 MED ORDER — HYDROCODONE-ACETAMINOPHEN 5-325 MG PO TABS: 1.0000 | ORAL_TABLET | Freq: Four times a day (QID) | ORAL | Status: AC | PRN

## 2011-11-13 MED ORDER — SODIUM CHLORIDE 0.9 % IV BOLUS (SEPSIS)
1000.0000 mL | Freq: Once | INTRAVENOUS | Status: AC
  Administered 2011-11-13: 1000 mL via INTRAVENOUS

## 2011-11-13 MED ORDER — MORPHINE SULFATE 4 MG/ML IJ SOLN
4.0000 mg | Freq: Once | INTRAMUSCULAR | Status: AC
  Administered 2011-11-13: 4 mg via INTRAVENOUS
  Filled 2011-11-13: qty 1

## 2011-11-13 NOTE — ED Notes (Signed)
Pt medicated c/o pain in her bottom and lower stomach. Pain appears to be intermittent.

## 2011-11-13 NOTE — ED Notes (Signed)
In and out cath completed using sterile technique. Clear urine returned. Pt tolerated well.

## 2011-11-13 NOTE — ED Notes (Signed)
Radiology provided 2 smoothie drinks for pt prior to CT.

## 2011-11-13 NOTE — ED Notes (Signed)
Pt states last BM was yesterday but bowel movements are painful in rectum.

## 2011-11-13 NOTE — ED Provider Notes (Signed)
History     CSN: [id number]  Arrival date & time 11/13/11  0522   First MD Initiated Contact with Patient 11/13/11 [phone]      Chief Complaint  Patient presents with  . Constipation    (Consider location/radiation/quality/duration/timing/severity/associated sxs/prior treatment) HPI Patient presents to the emergency department with left lower quadrant abdominal pain and constipation for the last week.  She had a BM yesterday but was loose watery stool she is having pain in her rectal area as well.  Patient denies nausea/vomiting/diarrhea, fever, chest pain, shortness of breath, back pain, or dysuria.  Patient was seen by her primary care doctor who placed her on stool softeners and MiraLAX. Past Medical History  Diagnosis Date  . Anxiety   . Coronary artery disease   . Depression   . Hypertension   . Tachy-brady syndrome   . PAF (paroxysmal atrial fibrillation)   . HX: anticoagulation   . Glaucoma   . Dizziness   . SSS (sick sinus syndrome)   . GERD (gastroesophageal reflux disease)   . Fall   . Osteoarthritis     Past Surgical History  Procedure Date  . Insert / replace / remove pacemaker 01/24/2008    DDD PACEMAKER IMPLANT  . Cholecystectomy   . Gastric bypass   . Breast reduction surgery   . US echocardiography 08/10/2007    EF 60%  . US echocardiography 01/12/2007    EF 70%  . Cardiovascular stress test 02/15/2007  . Total knee arthroplasty     right  . Pacemaker insertion     Family History  Problem Relation Age of Onset  . Kidney failure Mother   . Heart attack Father   . Heart attack Sister   . Heart attack Brother     History  Substance Use Topics  . Smoking status: Former Smoker    Quit date: 07/17/1961  . Smokeless tobacco: Not on file  . Alcohol Use: No    OB History    Grav Para Term Preterm Abortions TAB SAB Ect Mult Living                  Review of Systems All pertinent positives and negatives reviewed in the history of present  illness  Allergies  Codeine and Iodine  Home Medications   Current Outpatient Rx  Name Route Sig Dispense Refill  . ALENDRONATE SODIUM 70 MG PO TABS Oral Take 70 mg by mouth every 7 (seven) days. Take with a full glass of water on an empty stomach. Takes on mondays.    . AMIODARONE HCL 200 MG PO TABS Oral Take 200 mg by mouth daily.      . ATENOLOL 50 MG PO TABS Oral Take 50 mg by mouth daily.      . AZELASTINE HCL 0.05 % OP SOLN Right Eye Place 1 drop into the right eye 2 (two) times daily.      . BUPROPION HCL ER (XL) 300 MG PO TB24 Oral Take 300 mg by mouth daily.      Marland Kitchen DOCUSATE SODIUM 100 MG PO CAPS Oral Take 100 mg by mouth 2 (two) times daily as needed. For constipation     . DONEPEZIL HCL 10 MG PO TABS Oral Take 10 mg by mouth at bedtime.      Marland Kitchen HYDROCORTISONE ACETATE 25 MG RE SUPP Rectal Place 25 mg rectally 2 (two) times daily.     Marland Kitchen KLOR-CON 10 10 MEQ PO TBCR Oral Take 1 tablet  by mouth Daily.    Marland Kitchen LATANOPROST 0.005 % OP SOLN Both Eyes Place 1 drop into both eyes at bedtime.      . OMEPRAZOLE 20 MG PO CPDR Oral Take 20 mg by mouth daily.     Frazier Butt OP Ophthalmic Apply 1 drop to eye daily as needed. For dry eyes     . POLYETHYLENE GLYCOL 3350 PO PACK Oral Take 17 g by mouth 5 (five) times daily as needed. For constipation    . POTASSIUM CHLORIDE CRYS CR 10 MEQ PO TBCR Oral Take 10 mEq by mouth daily.      Marland Kitchen VITAMIN D (ERGOCALCIFEROL) 50000 UNITS PO CAPS Oral Take 1 tablet by mouth every 30 (thirty) days. Takes on the first of each month.    . WARFARIN SODIUM 2.5 MG PO TABS Oral Take 1.25-2.5 mg by mouth See admin instructions. Take as directed by Anticoagulation Clinic. Currently taking 1.25 mg (half tablet) on Monday and Friday. Takes 2.5 mg Tues,Wed,Thurs, Sat and Sun    . LATANOPROST 0.005 % OP SOLN Ophthalmic Apply 1 drop to eye at bedtime.       BP 167/89  Pulse 72  Temp(Src) 98.3 F (36.8 C) (Oral)  Resp 18  SpO2 99%  Physical Exam  Constitutional: She is  oriented to person, place, and time. She appears well-developed and well-nourished. No distress.  HENT:  Head: Normocephalic and atraumatic.  Eyes: Pupils are equal, round, and reactive to light.  Cardiovascular: Normal rate, regular rhythm and normal heart sounds.   Pulmonary/Chest: Effort normal and breath sounds normal. No respiratory distress.  Abdominal: Soft. Bowel sounds are normal. She exhibits no distension. There is tenderness in the left lower quadrant. There is no rigidity and no guarding.    Neurological: She is alert and oriented to person, place, and time.  Skin: Skin is warm and dry. No rash noted.    ED Course  Procedures (including critical care time)  Labs Reviewed  CBC - Abnormal; Notable for the following:    RDW 17.1 (*)    All other components within normal limits  BASIC METABOLIC PANEL - Abnormal; Notable for the following:    GFR calc non Af Amer 53 (*)    GFR calc Af Amer 61 (*)    All other components within normal limits  URINALYSIS, ROUTINE W REFLEX MICROSCOPIC - Abnormal; Notable for the following:    Bilirubin Urine SMALL (*)    Ketones, ur 15 (*)    All other components within normal limits   Ct Abdomen Pelvis Wo Contrast  11/13/2011  *RADIOLOGY REPORT*  Clinical Data: Constipation.  CT ABDOMEN AND PELVIS WITHOUT CONTRAST  Technique:  Multidetector CT imaging of the abdomen and pelvis was performed following the standard protocol without intravenous contrast.  Comparison: Plain films 11/13/2011  Findings: Moderate sized hiatal hernia.  There appears to be wall thickening within the hiatal hernia.  The heart is normal size. Lung bases are clear except for minimal left basilar scarring.  No effusions.  Prior cholecystectomy.  Liver, spleen, pancreas, adrenals and kidneys have an unremarkable unenhanced appearance otherwise. Mildly prominent intrahepatic and extrahepatic biliary ducts, likely related to patient's age and post cholecystectomy state.  There  is mild apparent wall thickening within the rectum. Surrounding stranding in the perirectal fat.  Cannot exclude proctitis or tumor.  Descending colonic and sigmoid diverticula. No active diverticulitis.  No increase in the expected stool burden.  Small bowel is decompressed.  Appendix is normal.  Aorta is normal caliber.  No free fluid, free air or adenopathy.  Uterus unremarkable.  There are bilateral ovarian cysts, largest on the right which measures up to 4.2 cm.  No acute bony abnormality degenerative changes in the lumbar spine and hips.  IMPRESSION: Prior cholecystectomy.  Apparent wall thickening within the rectum with surrounding perirectal edema/stranding.  Cannot exclude proctitis or even tumor.  Recommend clinical correlation.  Small hiatal hernia.  Wall thickening within the hiatal hernia. This can be further evaluated with direct visualization on endoscopy if felt clinically indicated.  Bilateral ovarian cysts.  Original Report Authenticated By: Cyndie Chime, M.D.   Dg Abd Acute W/chest  11/13/2011  *RADIOLOGY REPORT*  Clinical Data: Abdominal pain.  ACUTE ABDOMEN SERIES (ABDOMEN 2 VIEW & CHEST 1 VIEW)  Comparison: CT 07/18/2007  Findings: Left pacer is in place with leads in the right atrium and right ventricle.  Cardiomegaly.  Small hiatal hernia.  No focal opacities or effusions.  Prior cholecystectomy.  No free air.  No organomegaly or suspicious calcification.  Nonobstructive bowel gas pattern.  IMPRESSION: No obstruction or free air.  Prior cholecystectomy.  Cardiomegaly.  No active cardiopulmonary disease.  Original Report Authenticated By: Cyndie Chime, M.D.   Patient is stable here in the emergency department.  Her signs remained stable.  She is feeling better after a bowel movement.  I have advised the patient and her family member the test results.  All questions were answered and plan given.  The patient and family voiced an understanding.       MDM   Patient most likely  had mild constipation and possibly mild proctitis.  She is advised followup with her primary care Dr. for recheck.  Told to return here as needed for any worsening in her condition.       Carlyle Dolly, PA-C 11/13/11 1142

## 2011-11-13 NOTE — ED Provider Notes (Signed)
Medical screening examination/treatment/procedure(s) were performed by non-physician practitioner and as supervising physician I was immediately available for consultation/collaboration.  Lakeena Downie P Janea Schwenn, MD 11/13/11 1651 

## 2011-11-13 NOTE — ED Notes (Signed)
Pt has had multiple bowel movements. Pt changed a cleaned up. Stool is loose, brown in coloration. Pt reporting not having a bowel movement in over a week.

## 2011-11-13 NOTE — ED Notes (Signed)
Patient transported to CT 

## 2011-11-30 ENCOUNTER — Ambulatory Visit (INDEPENDENT_AMBULATORY_CARE_PROVIDER_SITE_OTHER): Payer: Medicare Other | Admitting: *Deleted

## 2011-11-30 DIAGNOSIS — I4891 Unspecified atrial fibrillation: Secondary | ICD-10-CM

## 2011-11-30 DIAGNOSIS — Z7901 Long term (current) use of anticoagulants: Secondary | ICD-10-CM

## 2011-12-13 ENCOUNTER — Encounter: Payer: Medicare Other | Admitting: *Deleted

## 2012-01-21 ENCOUNTER — Emergency Department (HOSPITAL_COMMUNITY): Payer: Medicare Other

## 2012-01-21 ENCOUNTER — Emergency Department (HOSPITAL_COMMUNITY)
Admission: EM | Admit: 2012-01-21 | Discharge: 2012-01-21 | Disposition: A | Discharge status: Home / Self Care | Payer: Medicare Other | Attending: Emergency Medicine | Admitting: Emergency Medicine

## 2012-01-21 ENCOUNTER — Encounter (HOSPITAL_COMMUNITY): Payer: Self-pay

## 2012-01-21 ENCOUNTER — Other Ambulatory Visit: Payer: Self-pay

## 2012-01-21 DIAGNOSIS — S0003XA Contusion of scalp, initial encounter: Secondary | ICD-10-CM | POA: Insufficient documentation

## 2012-01-21 DIAGNOSIS — I4891 Unspecified atrial fibrillation: Secondary | ICD-10-CM | POA: Insufficient documentation

## 2012-01-21 DIAGNOSIS — S0990XA Unspecified injury of head, initial encounter: Secondary | ICD-10-CM

## 2012-01-21 DIAGNOSIS — M79609 Pain in unspecified limb: Secondary | ICD-10-CM | POA: Insufficient documentation

## 2012-01-21 DIAGNOSIS — I2510 Atherosclerotic heart disease of native coronary artery without angina pectoris: Secondary | ICD-10-CM | POA: Insufficient documentation

## 2012-01-21 DIAGNOSIS — I10 Essential (primary) hypertension: Secondary | ICD-10-CM | POA: Insufficient documentation

## 2012-01-21 DIAGNOSIS — M5030 Other cervical disc degeneration, unspecified cervical region: Secondary | ICD-10-CM | POA: Insufficient documentation

## 2012-01-21 DIAGNOSIS — Z79899 Other long term (current) drug therapy: Secondary | ICD-10-CM | POA: Insufficient documentation

## 2012-01-21 DIAGNOSIS — M949 Disorder of cartilage, unspecified: Secondary | ICD-10-CM | POA: Insufficient documentation

## 2012-01-21 DIAGNOSIS — M899 Disorder of bone, unspecified: Secondary | ICD-10-CM | POA: Insufficient documentation

## 2012-01-21 DIAGNOSIS — F0390 Unspecified dementia without behavioral disturbance: Secondary | ICD-10-CM | POA: Insufficient documentation

## 2012-01-21 DIAGNOSIS — R51 Headache: Secondary | ICD-10-CM | POA: Insufficient documentation

## 2012-01-21 DIAGNOSIS — W19XXXA Unspecified fall, initial encounter: Secondary | ICD-10-CM

## 2012-01-21 DIAGNOSIS — S1093XA Contusion of unspecified part of neck, initial encounter: Secondary | ICD-10-CM | POA: Insufficient documentation

## 2012-01-21 DIAGNOSIS — Y9210 Unspecified residential institution as the place of occurrence of the external cause: Secondary | ICD-10-CM | POA: Insufficient documentation

## 2012-01-21 LAB — URINALYSIS, ROUTINE W REFLEX MICROSCOPIC
Leukocytes, UA: NEGATIVE
Nitrite: NEGATIVE
Specific Gravity, Urine: 1.02 (ref 1.005–1.030)
pH: 6.5 (ref 5.0–8.0)

## 2012-01-21 LAB — CBC
MCH: 29.2 pg (ref 26.0–34.0)
MCHC: 32 g/dL (ref 30.0–36.0)
MCV: 91.4 fL (ref 78.0–100.0)
Platelets: 279 10*3/uL (ref 150–400)
RDW: 14.6 % (ref 11.5–15.5)

## 2012-01-21 LAB — BASIC METABOLIC PANEL
Calcium: 9.7 mg/dL (ref 8.4–10.5)
GFR calc non Af Amer: 51 mL/min — ABNORMAL LOW (ref >90)
Sodium: 137 mEq/L (ref 135–145)

## 2012-01-21 LAB — PROTIME-INR
INR: 1 (ref 0.00–1.49)
Prothrombin Time: 13.4 seconds (ref 11.6–15.2)

## 2012-01-21 LAB — DIFFERENTIAL
Basophils Absolute: 0 10*3/uL (ref 0.0–0.1)
Eosinophils Absolute: 0.1 10*3/uL (ref 0.0–0.7)
Eosinophils Relative: 1 % (ref 0–5)

## 2012-01-21 LAB — CARDIAC PANEL(CRET KIN+CKTOT+MB+TROPI): Relative Index: INVALID (ref 0.0–2.5)

## 2012-01-21 NOTE — Discharge Instructions (Signed)
Head Injury, Adult You have had a head injury that does not appear serious at this time. A concussion is a state of changed mental ability, usually from a blow to the head. You should take clear liquids for the rest of the day and then resume your regular diet. You should not take sedatives or alcoholic beverages for as long as directed by your caregiver after discharge. After injuries such as yours, most problems occur within the first 24 hours. SYMPTOMS These minor symptoms may be experienced after discharge:  Memory difficulties.   Dizziness.   Headaches.   Double vision.   Hearing difficulties.   Depression.   Tiredness.   Weakness.   Difficulty with concentration.  If you experience any of these problems, you should not be alarmed. A concussion requires a few days for recovery. Many patients with head injuries frequently experience such symptoms. Usually, these problems disappear without medical care. If symptoms last for more than one day, notify your caregiver. See your caregiver sooner if symptoms are becoming worse rather than better. HOME CARE INSTRUCTIONS   During the next 24 hours you must stay with someone who can watch you for the warning signs listed below.  Although it is unlikely that serious side effects will occur, you should be aware of signs and symptoms which may necessitate your return to this location. Side effects may occur up to 7 - 10 days following the injury. It is important for you to carefully monitor your condition and contact your caregiver or seek immediate medical attention if there is a change in your condition. SEEK IMMEDIATE MEDICAL CARE IF:   There is confusion or drowsiness.   You can not awaken the injured person.   There is nausea (feeling sick to your stomach) or continued, forceful vomiting.   You notice dizziness or unsteadiness which is getting worse, or inability to walk.   You have convulsions or unconsciousness.   You experience  severe, persistent headaches not relieved by over-the-counter or prescription medicines for pain. (Do not take aspirin as this impairs clotting abilities). Take other pain medications only as directed.   You can not use arms or legs normally.   There is clear or bloody discharge from the nose or ears.  MAKE SURE YOU:   Understand these instructions.   Will watch your condition.   Will get help right away if you are not doing well or get worse.  Document Released: 11/07/2005 Document Revised: 07/20/2011 Document Reviewed: 09/25/2009 [employer] Patient Information 2012 [employer]. 

## 2012-01-21 NOTE — ED Notes (Signed)
Pt resides at [facility] - unwitnessed fall- denies LOC,neck and back pain, Pt presents with no acute distress- alert and active with care- GCS 15 PEERL- large contusion to left upper forehead-

## 2012-01-21 NOTE — ED Provider Notes (Signed)
History     CSN: [id number]  Arrival date & time 01/21/12  1608   First MD Initiated Contact with Patient 01/21/12 1655      Chief Complaint  Patient presents with  . Fall  . Head Injury    (Consider location/radiation/quality/duration/timing/severity/associated sxs/prior treatment) HPI Comments: Patient from nursing home after a witnessed fall. She has a large hematoma to her left fourth. She is awake and alert. She is oriented x2 and does have a history of dementia. Is unclear whether this is her baseline. She is unable to tell how the fall occurred. For her she states she hit her head on the windshield and then she said that she fell while playing with children. Denies loss of consciousness, neck or back pain. No chest pain, SOB, abdominal pain, back pain. Hx Afib but no anticoagulation on med list.  The history is provided by the patient and the EMS personnel.    Past Medical History  Diagnosis Date  . Anxiety   . Coronary artery disease   . Depression   . Hypertension   . Tachy-brady syndrome   . PAF (paroxysmal atrial fibrillation)   . HX: anticoagulation   . Glaucoma   . Dizziness   . SSS (sick sinus syndrome)   . GERD (gastroesophageal reflux disease)   . Fall   . Osteoarthritis     Past Surgical History  Procedure Date  . Insert / replace / remove pacemaker 01/24/2008    DDD PACEMAKER IMPLANT  . Cholecystectomy   . Gastric bypass   . Breast reduction surgery   . US echocardiography 08/10/2007    EF 60%  . US echocardiography 01/12/2007    EF 70%  . Cardiovascular stress test 02/15/2007  . Total knee arthroplasty     right  . Pacemaker insertion     Family History  Problem Relation Age of Onset  . Kidney failure Mother   . Heart attack Father   . Heart attack Sister   . Heart attack Brother     History  Substance Use Topics  . Smoking status: Former Smoker    Quit date: 07/17/1961  . Smokeless tobacco: Not on file  . Alcohol Use: No    OB  History    Grav Para Term Preterm Abortions TAB SAB Ect Mult Living                  Review of Systems  Constitutional: Negative for activity change and appetite change.  HENT: Negative for neck pain.   Respiratory: Negative for chest tightness.   Cardiovascular: Negative for chest pain.  Gastrointestinal: Negative for abdominal pain.  Musculoskeletal: Negative for back pain.  Neurological: Positive for headaches.    Allergies  Codeine and Iodine  Home Medications   Current Outpatient Rx  Name Route Sig Dispense Refill  . ALENDRONATE SODIUM 70 MG PO TABS Oral Take 70 mg by mouth every 7 (seven) days. Take with a full glass of water on an empty stomach. Takes on mondays.    . AMIODARONE HCL 200 MG PO TABS Oral Take 200 mg by mouth daily.      . ASPIRIN 325 MG PO TABS Oral Take 325 mg by mouth daily.    . ATENOLOL 50 MG PO TABS Oral Take 50 mg by mouth daily.      . AZELASTINE HCL 0.05 % OP SOLN Right Eye Place 1 drop into the right eye 2 (two) times daily.      Marland Kitchen  BUPROPION HCL ER (XL) 300 MG PO TB24 Oral Take 300 mg by mouth daily.      Marland Kitchen DOCUSATE SODIUM 100 MG PO CAPS Oral Take 100 mg by mouth 2 (two) times daily as needed. For constipation     . DONEPEZIL HCL 10 MG PO TABS Oral Take 10 mg by mouth at bedtime.      Marland Kitchen HYDROCORTISONE ACETATE 25 MG RE SUPP Rectal Place 25 mg rectally 2 (two) times daily.     Marland Kitchen LATANOPROST 0.005 % OP SOLN Both Eyes Place 1 drop into both eyes at bedtime.      . OMEPRAZOLE 20 MG PO CPDR Oral Take 20 mg by mouth daily.     Marland Kitchen POTASSIUM CHLORIDE CRYS ER 10 MEQ PO TBCR Oral Take 10 mEq by mouth daily.      Marland Kitchen VITAMIN D (ERGOCALCIFEROL) 50000 UNITS PO CAPS Oral Take 1 tablet by mouth every 30 (thirty) days. Takes on the first of each month.      BP 166/68  Pulse 71  Temp(Src) 98.8 F (37.1 C) (Oral)  Resp 16  Wt 200 lb (90.719 kg)  SpO2 98%  Physical Exam  Constitutional: She appears well-developed and well-nourished. No distress.  HENT:  Head:  Normocephalic.  Right Ear: External ear normal.  Left Ear: External ear normal.  Mouth/Throat: Oropharynx is clear and moist. No oropharyngeal exudate.       Large hematoma with ecchymosis to left for head  Eyes: Conjunctivae and EOM are normal. Pupils are equal, round, and reactive to light.       Small but equal and reactive pupils  Neck: Normal range of motion. Neck supple.       No C spine pain, step off or deformity  Cardiovascular: Normal rate, regular rhythm and normal heart sounds.   Pulmonary/Chest: Effort normal and breath sounds normal. No respiratory distress.  Abdominal: Soft. There is no tenderness. There is no rebound and no guarding.  Musculoskeletal: Normal range of motion. She exhibits no edema and no tenderness.  Neurological:       Oriented to self and situation. Unable to tell me day of the week or year. Equal grip strength, cranial nerves II through XII intact.  Skin: Skin is warm.    ED Course  Procedures (including critical care time)  Labs Reviewed  BASIC METABOLIC PANEL - Abnormal; Notable for the following:    GFR calc non Af Amer 51 (*)    GFR calc Af Amer 59 (*)    All other components within normal limits  URINALYSIS, ROUTINE W REFLEX MICROSCOPIC - Abnormal; Notable for the following:    Ketones, ur TRACE (*)    All other components within normal limits  CBC  DIFFERENTIAL  PROTIME-INR  CARDIAC PANEL(CRET KIN+CKTOT+MB+TROPI)  URINE CULTURE   Ct Head Wo Contrast  01/21/2012  *RADIOLOGY REPORT*  Clinical Data:  Fall  CT HEAD WITHOUT CONTRAST CT CERVICAL SPINE WITHOUT CONTRAST  Technique:  Multidetector CT imaging of the head and cervical spine was performed following the standard protocol without intravenous contrast.  Multiplanar CT image reconstructions of the cervical spine were also generated.  Comparison:  11/01/2011  CT HEAD  Findings: Generalized atrophy.  Extensive chronic microvascular ischemic change in the white matter.  No acute infarct.  No  intracranial hemorrhage or mass.  Large left frontal scalp hematoma.  Negative for skull fracture.  IMPRESSION: Atrophy and chronic microvascular ischemia.  No acute intracranial abnormality.  CT CERVICAL SPINE  Findings:  Negative for fracture.  Mild anterior slip C4-5 and C5-6.  Disc degeneration and spondylosis are present, most prominent at C5-6 and C6-7.  There is advanced facet degeneration at C4-5,  C5-6 and C6-7 and C7-T1 bilaterally.  Degenerative changes and spurring on the right at C1- 2.  IMPRESSION: Negative for fracture.  Original Report Authenticated By: Camelia Phenes, M.D.   Ct Cervical Spine Wo Contrast  01/21/2012  *RADIOLOGY REPORT*  Clinical Data:  Fall  CT HEAD WITHOUT CONTRAST CT CERVICAL SPINE WITHOUT CONTRAST  Technique:  Multidetector CT imaging of the head and cervical spine was performed following the standard protocol without intravenous contrast.  Multiplanar CT image reconstructions of the cervical spine were also generated.  Comparison:  11/01/2011  CT HEAD  Findings: Generalized atrophy.  Extensive chronic microvascular ischemic change in the white matter.  No acute infarct.  No intracranial hemorrhage or mass.  Large left frontal scalp hematoma.  Negative for skull fracture.  IMPRESSION: Atrophy and chronic microvascular ischemia.  No acute intracranial abnormality.  CT CERVICAL SPINE  Findings: Negative for fracture.  Mild anterior slip C4-5 and C5-6.  Disc degeneration and spondylosis are present, most prominent at C5-6 and C6-7.  There is advanced facet degeneration at C4-5,  C5-6 and C6-7 and C7-T1 bilaterally.  Degenerative changes and spurring on the right at C1- 2.  IMPRESSION: Negative for fracture.  Original Report Authenticated By: Camelia Phenes, M.D.   Dg Foot Complete Left  01/21/2012  *RADIOLOGY REPORT*  Clinical Data: Left foot pain  LEFT FOOT - COMPLETE 3+ VIEW  Comparison: None  Findings: Bones are diffusely osteopenic.  There is a chronic fracture deformity  involving the distal aspect of the fourth metatarsal.  Nonunion deformity is suspected.  Mild hallux valgus deformity is noted.  There are secondary degenerative changes at the first metatarsal phalangeal joint.  The patient is noted to have posterior and plantar calcaneal heel spurs.  IMPRESSION:  1.  No acute bony abnormalities. 2.  Nonunion deformity involves the distal aspect of the fourth metatarsal. 3.  Osteopenia 4.  Hallux valgus deformity.  Original Report Authenticated By: Rosealee Albee, M.D.     1. Head injury   2. Fall       MDM  Present mechanical fall with large forehead hematoma. Appears to be at baseline but will confirm a facility staff.  CT head without skull fracture or intracerebral hemorrhage. INR normal, patient no longer on coumadin.  Cousin at bedside states patient has been at a dementia unit for one day and she thinks she should go to a different facility.  I spoke on the phone with patient's power of attorney and daughter Liliane Channel. Patient's POA would like her to go back to her Alzheimer's care unit. Her confusion is at baseline   Date: 01/21/2012  Rate: 71  Rhythm: atrial fibrillation  QRS Axis: normal  Intervals: normal  ST/T Wave abnormalities: nonspecific ST/T changes  Conduction Disutrbances:none  Narrative Interpretation:   Old EKG Reviewed: changes noted         Glynn Octave, MD 01/22/12 [phone]

## 2012-01-21 NOTE — ED Notes (Signed)
Called [facility] [phone] for return of this pt- unable to get person - message left

## 2012-01-21 NOTE — ED Notes (Signed)
[facility] called for transport.  

## 2012-01-21 NOTE — ED Notes (Signed)
WUJ:[id number]<JY> Expected date:01/21/12<BR> Expected time: 4:09 PM<BR> Means of arrival:Ambulance<BR> Comments:<BR> EMS 100 GC- 78 y/o female fall at nursing home. Small hematoma to forehead. No other complaints. Vitals WNL.

## 2012-01-23 LAB — URINE CULTURE: Culture  Setup Time: 201303030449

## 2012-01-31 ENCOUNTER — Encounter (HOSPITAL_COMMUNITY): Payer: Self-pay | Admitting: Emergency Medicine

## 2012-01-31 ENCOUNTER — Emergency Department (HOSPITAL_COMMUNITY)
Admission: EM | Admit: 2012-01-31 | Discharge: 2012-02-01 | Disposition: A | Discharge status: Other Acute Inpatient Hospital | Payer: Medicare Other | Attending: Emergency Medicine | Admitting: Emergency Medicine

## 2012-01-31 ENCOUNTER — Emergency Department (HOSPITAL_COMMUNITY): Payer: Medicare Other

## 2012-01-31 DIAGNOSIS — I10 Essential (primary) hypertension: Secondary | ICD-10-CM | POA: Insufficient documentation

## 2012-01-31 DIAGNOSIS — N83201 Unspecified ovarian cyst, right side: Secondary | ICD-10-CM

## 2012-01-31 DIAGNOSIS — K5730 Diverticulosis of large intestine without perforation or abscess without bleeding: Secondary | ICD-10-CM | POA: Insufficient documentation

## 2012-01-31 DIAGNOSIS — W050XXA Fall from non-moving wheelchair, initial encounter: Secondary | ICD-10-CM | POA: Insufficient documentation

## 2012-01-31 DIAGNOSIS — I2510 Atherosclerotic heart disease of native coronary artery without angina pectoris: Secondary | ICD-10-CM | POA: Insufficient documentation

## 2012-01-31 DIAGNOSIS — S0003XA Contusion of scalp, initial encounter: Secondary | ICD-10-CM | POA: Insufficient documentation

## 2012-01-31 DIAGNOSIS — T1490XA Injury, unspecified, initial encounter: Secondary | ICD-10-CM | POA: Insufficient documentation

## 2012-01-31 DIAGNOSIS — M169 Osteoarthritis of hip, unspecified: Secondary | ICD-10-CM | POA: Insufficient documentation

## 2012-01-31 DIAGNOSIS — M25559 Pain in unspecified hip: Secondary | ICD-10-CM | POA: Insufficient documentation

## 2012-01-31 DIAGNOSIS — M1610 Unilateral primary osteoarthritis, unspecified hip: Secondary | ICD-10-CM | POA: Insufficient documentation

## 2012-01-31 DIAGNOSIS — M47812 Spondylosis without myelopathy or radiculopathy, cervical region: Secondary | ICD-10-CM | POA: Insufficient documentation

## 2012-01-31 DIAGNOSIS — R0602 Shortness of breath: Secondary | ICD-10-CM | POA: Insufficient documentation

## 2012-01-31 DIAGNOSIS — W19XXXA Unspecified fall, initial encounter: Secondary | ICD-10-CM

## 2012-01-31 DIAGNOSIS — N83209 Unspecified ovarian cyst, unspecified side: Secondary | ICD-10-CM | POA: Insufficient documentation

## 2012-01-31 NOTE — ED Provider Notes (Addendum)
History     CSN: [id number]  Arrival date & time 01/31/12  2323   First MD Initiated Contact with Patient 01/31/12 2338      Chief Complaint  Patient presents with  . Fall    (Consider location/radiation/quality/duration/timing/severity/associated sxs/prior treatment) Patient is a 78 y.o. female presenting with fall. The history is provided by the EMS personnel and medical records. The history is limited by the condition of the patient. No language interpreter was used.  Fall The accident occurred less than 1 hour ago. Incident: from wheelchair. She fell from a height of 1 to 2 ft. She landed on a hard floor. There was no blood loss. The point of impact was the head. Pain scale: unable to obtain. Pain severity now: unknown. She was not ambulatory at the scene. There was no drug use involved in the accident. There was no alcohol use involved in the accident. Pertinent negatives include no loss of consciousness. Treatment on scene includes a c-collar. She has tried nothing for the symptoms. The treatment provided no relief.    Past Medical History  Diagnosis Date  . Anxiety   . Coronary artery disease   . Depression   . Hypertension   . Tachy-brady syndrome   . PAF (paroxysmal atrial fibrillation)   . HX: anticoagulation   . Glaucoma   . Dizziness   . SSS (sick sinus syndrome)   . GERD (gastroesophageal reflux disease)   . Fall   . Osteoarthritis     Past Surgical History  Procedure Date  . Insert / replace / remove pacemaker 01/24/2008    DDD PACEMAKER IMPLANT  . Cholecystectomy   . Gastric bypass   . Breast reduction surgery   . US echocardiography 08/10/2007    EF 60%  . US echocardiography 01/12/2007    EF 70%  . Cardiovascular stress test 02/15/2007  . Total knee arthroplasty     right  . Pacemaker insertion     Family History  Problem Relation Age of Onset  . Kidney failure Mother   . Heart attack Father   . Heart attack Sister   . Heart attack Brother      History  Substance Use Topics  . Smoking status: Former Smoker    Quit date: 07/17/1961  . Smokeless tobacco: Not on file  . Alcohol Use: No    OB History    Grav Para Term Preterm Abortions TAB SAB Ect Mult Living                  Review of Systems  Unable to perform ROS Neurological: Negative for loss of consciousness.    Allergies  Codeine and Iodine  Home Medications   Current Outpatient Rx  Name Route Sig Dispense Refill  . ALENDRONATE SODIUM 70 MG PO TABS Oral Take 70 mg by mouth every 7 (seven) days. Take with a full glass of water on an empty stomach. Takes on mondays.    . AMIODARONE HCL 200 MG PO TABS Oral Take 200 mg by mouth daily.      . ASPIRIN 325 MG PO TABS Oral Take 325 mg by mouth daily.    . ATENOLOL 50 MG PO TABS Oral Take 50 mg by mouth daily.      . AZELASTINE HCL 0.05 % OP SOLN Right Eye Place 1 drop into the right eye 2 (two) times daily.      . BUPROPION HCL ER (XL) 300 MG PO TB24 Oral Take 300 mg  by mouth daily.      Marland Kitchen DOCUSATE SODIUM 100 MG PO CAPS Oral Take 100 mg by mouth 2 (two) times daily as needed. For constipation     . DONEPEZIL HCL 10 MG PO TABS Oral Take 10 mg by mouth at bedtime.      Marland Kitchen HYDROCORTISONE ACETATE 25 MG RE SUPP Rectal Place 25 mg rectally 2 (two) times daily.     Marland Kitchen LATANOPROST 0.005 % OP SOLN Both Eyes Place 1 drop into both eyes at bedtime.      . OMEPRAZOLE 20 MG PO CPDR Oral Take 20 mg by mouth daily.     Marland Kitchen POTASSIUM CHLORIDE CRYS ER 10 MEQ PO TBCR Oral Take 10 mEq by mouth daily.      Marland Kitchen VITAMIN D (ERGOCALCIFEROL) 50000 UNITS PO CAPS Oral Take 1 tablet by mouth every 30 (thirty) days. Takes on the first of each month.      There were no vitals taken for this visit.  Physical Exam  Constitutional: No distress.  Eyes: EOM are normal. Pupils are equal, round, and reactive to light.       Bruising of the face healing, yellowing left forehead cephalohematoma  Neck: No tracheal deviation present.  Cardiovascular:  Normal rate and regular rhythm.   Pulmonary/Chest: Breath sounds normal. No respiratory distress.  Abdominal: Soft. Bowel sounds are normal. There is no tenderness. There is no rebound and no guarding.  Musculoskeletal: She exhibits no edema.       Intact dorsalis pedis B. L5/s1 intact intact perineal sensation  Neurological: She is alert. She has normal reflexes.  Skin: Skin is warm and dry.  Psychiatric: She has a normal mood and affect.    ED Course  Procedures (including critical care time)   Labs Reviewed  CBC  DIFFERENTIAL   No results found.   No diagnosis found.    MDM  CT and plain films, if negative D/C   Will need outpatient pelvic US for cysts will not on discharge papers     Floyd Lusignan K Price Lachapelle-Rasch, MD 02/01/12 0021  Turkessa Ostrom Smitty Cords, MD 02/01/12 [phone]

## 2012-01-31 NOTE — ED Notes (Signed)
Pt from [facility]. Pt fell trying to get out of wheelchair. Pt has old bruises on bilateral cheeks on face. Pt has hematoma on L side of forehead. Pt has bruising to LLE. Pt at normal baseline per EMS.

## 2012-01-31 NOTE — ED Notes (Signed)
Pt arrives with C-collar in place. No backboard.

## 2012-02-01 ENCOUNTER — Encounter (HOSPITAL_COMMUNITY): Payer: Self-pay | Admitting: *Deleted

## 2012-02-01 ENCOUNTER — Emergency Department (HOSPITAL_COMMUNITY): Payer: Medicare Other

## 2012-02-01 LAB — CBC
HCT: 39.6 % (ref 36.0–46.0)
MCHC: 33.6 g/dL (ref 30.0–36.0)
MCV: 90.8 fL (ref 78.0–100.0)
RDW: 14.3 % (ref 11.5–15.5)

## 2012-02-01 LAB — POCT I-STAT, CHEM 8
BUN: 13 mg/dL (ref 6–23)
Calcium, Ion: 1.14 mmol/L (ref 1.12–1.32)
Glucose, Bld: 81 mg/dL (ref 70–99)
TCO2: 26 mmol/L (ref 0–100)

## 2012-02-01 LAB — DIFFERENTIAL
Basophils Absolute: 0 10*3/uL (ref 0.0–0.1)
Basophils Relative: 0 % (ref 0–1)
Eosinophils Relative: 2 % (ref 0–5)
Monocytes Absolute: 0.6 10*3/uL (ref 0.1–1.0)

## 2012-02-01 NOTE — Discharge Instructions (Signed)
Ovarian Cyst The ovaries are small organs that are on each side of the uterus. The ovaries are the organs that produce the female hormones, estrogen and progesterone. An ovarian cyst is a sac filled with fluid that can vary in its size. It is normal for a small cyst to form in women who are in the childbearing age and who have menstrual periods. This type of cyst is called a follicle cyst that becomes an ovulation cyst (corpus luteum cyst) after it produces the women's egg. It later goes away on its own if the woman does not become pregnant. There are other kinds of ovarian cysts that may cause problems and may need to be treated. The most serious problem is a cyst with cancer. It should be noted that menopausal women who have an ovarian cyst are at a higher risk of it being a cancer cyst. They should be evaluated very quickly, thoroughly and followed closely. This is especially true in menopausal women because of the high rate of ovarian cancer in women in menopause. CAUSES AND TYPES OF OVARIAN CYSTS:  FUNCTIONAL CYST: The follicle/corpus luteum cyst is a functional cyst that occurs every month during ovulation with the menstrual cycle. They go away with the next menstrual cycle if the woman does not get pregnant. Usually, there are no symptoms with a functional cyst.   ENDOMETRIOMA CYST: This cyst develops from the lining of the uterus tissue. This cyst gets in or on the ovary. It grows every month from the bleeding during the menstrual period. It is also called a "chocolate cyst" because it becomes filled with blood that turns brown. This cyst can cause pain in the lower abdomen during intercourse and with your menstrual period.   CYSTADENOMA CYST: This cyst develops from the cells on the outside of the ovary. They usually are not cancerous. They can get very big and cause lower abdomen pain and pain with intercourse. This type of cyst can twist on itself, cut off its blood supply and cause severe pain.  It also can easily rupture and cause a lot of pain.   DERMOID CYST: This type of cyst is sometimes found in both ovaries. They are found to have different kinds of body tissue in the cyst. The tissue includes skin, teeth, hair, and/or cartilage. They usually do not have symptoms unless they get very big. Dermoid cysts are rarely cancerous.   POLYCYSTIC OVARY: This is a rare condition with hormone problems that produces many small cysts on both ovaries. The cysts are follicle-like cysts that never produce an egg and become a corpus luteum. It can cause an increase in body weight, infertility, acne, increase in body and facial hair and lack of menstrual periods or rare menstrual periods. Many women with this problem develop type 2 diabetes. The exact cause of this problem is unknown. A polycystic ovary is rarely cancerous.   THECA LUTEIN CYST: Occurs when too much hormone (human chorionic gonadotropin) is produced and over-stimulates the ovaries to produce an egg. They are frequently seen when doctors stimulate the ovaries for invitro-fertilization (test tube babies).   LUTEOMA CYST: This cyst is seen during pregnancy. Rarely it can cause an obstruction to the birth canal during labor and delivery. They usually go away after delivery.  SYMPTOMS   Pelvic pain or pressure.   Pain during sexual intercourse.   Increasing girth (swelling) of the abdomen.   Abnormal menstrual periods.   Increasing pain with menstrual periods.   You stop having   menstrual periods and you are not pregnant.  DIAGNOSIS  The diagnosis can be made during:  Routine or annual pelvic examination (common).   Ultrasound.   X-ray of the pelvis.   CT Scan.   MRI.   Blood tests.  TREATMENT   Treatment may only be to follow the cyst monthly for 2 to 3 months with your caregiver. Many go away on their own, especially functional cysts.   May be aspirated (drained) with a long needle with ultrasound, or by laparoscopy  (inserting a tube into the pelvis through a small incision).   The whole cyst can be removed by laparoscopy.   Sometimes the cyst may need to be removed through an incision in the lower abdomen.   Hormone treatment is sometimes used to help dissolve certain cysts.   Birth control pills are sometimes used to help dissolve certain cysts.  HOME CARE INSTRUCTIONS  Follow your caregiver's advice regarding:  Medicine.   Follow up visits to evaluate and treat the cyst.   You may need to come back or make an appointment with another caregiver, to find the exact cause of your cyst, if your caregiver is not a gynecologist.   Get your yearly and recommended pelvic examinations and Pap tests.   Let your caregiver know if you have had an ovarian cyst in the past.  SEEK MEDICAL CARE IF:   Your periods are late, irregular, they stop, or are painful.   Your stomach (abdomen) or pelvic pain does not go away.   Your stomach becomes larger or swollen.   You have pressure on your bladder or trouble emptying your bladder completely.   You have painful sexual intercourse.   You have feelings of fullness, pressure, or discomfort in your stomach.   You lose weight for no apparent reason.   You feel generally ill.   You become constipated.   You lose your appetite.   You develop acne.   You have an increase in body and facial hair.   You are gaining weight, without changing your exercise and eating habits.   You think you are pregnant.  SEEK IMMEDIATE MEDICAL CARE IF:   You have increasing abdominal pain.   You feel sick to your stomach (nausea) and/or vomit.   You develop a fever that comes on suddenly.   You develop abdominal pain during a bowel movement.   Your menstrual periods become heavier than usual.  Document Released: 11/07/2005 Document Revised: 10/27/2011 Document Reviewed: 09/10/2009 [facility] Patient Information 2012 [place], [place].Cephalohematoma A cephalohematoma  is a collection of blood under the scalp of a newborn infant. The blood is located between the baby's bones of the skull and the lining over the bones (the periosteum). This is usually an injury that occurs during the birthing process of labor and delivery. There may be no evidence of trauma during labor or delivery. These injuries may happen more commonly in first pregnancies, if the baby's head is larger than the birth canal as with a large baby. Sometimes the injury occurs with forceps or vacuum extraction use. Forceps are a tool that helps in delivering a baby. That may be because forceps are used in more difficult deliveries. Forceps usually protect the baby's head during delivery because they do not allow the head to be squeezed as hard. Sometimes a fracture (break) of one of the bones in the skull is the cause. Such fractures usually occur on the sides of the head. DIAGNOSIS  The diagnosis is usually  made by your caregiver by X-ray of the baby's head. It is based on where the blood is located and if it is present over one of the skulls bones. The blood follows the outlines of one of the bones of the skull without extending past the outline of that skull bone. TREATMENT  Most cephalohematomas get better with no treatment within 3 months. Even when fairly large they usually are not drained with a needle because this increases the chance of starting an infection. RELATED COMPLICATIONS  If the collection of blood is large it may take a long time to get better. This is usually not a problem.   If so much blood is trapped that there is less in the baby's vessels, there may be lower amounts of red blood cells (anemia). Sometimes a blood transfusion may be necessary.   Also when blood breaks down, it makes the bilirubin rise and the baby may become jaundiced. This may require phototherapy in unusual cases.   Sometimes calcium deposits may form in blood left behind. Although it may seem unsightly, it also  will usually leave with time.   These small fractures usually do not need treatment.  Caput Succedaneum is a similar condition except the swelling is fluid, not blood. This fluid collection is located closer to the surface and is not limited to a location right over a skull bone. It usually disappears in a couple of days. Discuss with your caregiver if there is any bulge over the baby's skull, and ask your caregiver if there is a possibility of anemia or jaundice. Document Released: 11/07/2005 Document Revised: 10/27/2011 Document Reviewed: 05/03/2007 [facility] Patient Information 2012 [place], [place].

## 2012-02-27 ENCOUNTER — Emergency Department (HOSPITAL_COMMUNITY)
Admission: EM | Admit: 2012-02-27 | Discharge: 2012-02-28 | Disposition: A | Discharge status: Home / Self Care | Payer: Medicare Other | Attending: Emergency Medicine | Admitting: Emergency Medicine

## 2012-02-27 ENCOUNTER — Encounter (HOSPITAL_COMMUNITY): Payer: Self-pay

## 2012-02-27 DIAGNOSIS — Z79899 Other long term (current) drug therapy: Secondary | ICD-10-CM | POA: Insufficient documentation

## 2012-02-27 DIAGNOSIS — Z9181 History of falling: Secondary | ICD-10-CM | POA: Insufficient documentation

## 2012-02-27 DIAGNOSIS — H409 Unspecified glaucoma: Secondary | ICD-10-CM | POA: Insufficient documentation

## 2012-02-27 DIAGNOSIS — R609 Edema, unspecified: Secondary | ICD-10-CM | POA: Insufficient documentation

## 2012-02-27 DIAGNOSIS — M545 Low back pain, unspecified: Secondary | ICD-10-CM | POA: Insufficient documentation

## 2012-02-27 DIAGNOSIS — F068 Other specified mental disorders due to known physiological condition: Secondary | ICD-10-CM | POA: Insufficient documentation

## 2012-02-27 DIAGNOSIS — F341 Dysthymic disorder: Secondary | ICD-10-CM | POA: Insufficient documentation

## 2012-02-27 DIAGNOSIS — I4891 Unspecified atrial fibrillation: Secondary | ICD-10-CM | POA: Insufficient documentation

## 2012-02-27 DIAGNOSIS — K219 Gastro-esophageal reflux disease without esophagitis: Secondary | ICD-10-CM | POA: Insufficient documentation

## 2012-02-27 DIAGNOSIS — Z7982 Long term (current) use of aspirin: Secondary | ICD-10-CM | POA: Insufficient documentation

## 2012-02-27 DIAGNOSIS — W19XXXA Unspecified fall, initial encounter: Secondary | ICD-10-CM | POA: Insufficient documentation

## 2012-02-27 DIAGNOSIS — I2510 Atherosclerotic heart disease of native coronary artery without angina pectoris: Secondary | ICD-10-CM | POA: Insufficient documentation

## 2012-02-27 DIAGNOSIS — M1990 Unspecified osteoarthritis, unspecified site: Secondary | ICD-10-CM | POA: Insufficient documentation

## 2012-02-27 DIAGNOSIS — I10 Essential (primary) hypertension: Secondary | ICD-10-CM | POA: Insufficient documentation

## 2012-02-27 LAB — URINE MICROSCOPIC-ADD ON

## 2012-02-27 LAB — URINALYSIS, ROUTINE W REFLEX MICROSCOPIC
Bilirubin Urine: NEGATIVE
Glucose, UA: NEGATIVE mg/dL
Ketones, ur: NEGATIVE mg/dL
Protein, ur: NEGATIVE mg/dL

## 2012-02-27 NOTE — ED Notes (Signed)
MD at bedside. 

## 2012-02-27 NOTE — ED Notes (Signed)
Pt was found on the floor in her bathroom, staff assumes she fell, pt complaining of lower back pain when moving off ems stretcher

## 2012-02-27 NOTE — Discharge Instructions (Signed)
Your evaluation in the ER today after your fall did not show any broken bones or serious injury.  Please be careful in the future when you are getting out of bed or chair as you may fall again.  Follow up with your doctor for recheck in 2-3 days as needed.  Return to the ER for worsening condition or new concerning symptoms.   FALL PREVENTION (EDU): You have requested information on Fall Prevention.  According to a 2003 study from the Journal of the [employer], more than 1.8 million adults, aged 72 and older, were treated in emergency departments for fall-related injuries. More than 421,000 were hospitalized. The most common injuries from a fall are head injuries that in turn cause a brain injury, and fractures (broken bones). Of all types of broken bones that happen from falls, hip fractures are the most serious and lead to the greatest number of health problems and deaths.  To make your living area safer, older adults should consider the following:      Improve lighting throughout the home. Use night-lights to help you see at night.     Have handrails installed on both sides of stairways.     Have grab bars placed next to the toilet and in the shower. Also consider an elevated toilet seat and a shower chair.     Use non-slip bath mats in the tub or shower.     Remove "throw rugs" to prevent tripping.     Avoid the use of long robes to prevent tripping.     Wear well-fitted shoes or slippers. Wearing loose footwear can cause you to shuffle, and make you more likely to trip and fall. Inexpensive anti-slip socks can also be purchased.     Be sure to keep all electrical cords and small objects out of the pathway.     If a cane, walker or any other assistive device is used, be sure to have them inspected regularly. The devices must be used correctly to prevent injuries.     Remember to move about at a pace that is comfortable for your ability. For example, do not rush to  answer the doorbell or the phone. Take your time. In recent studies, a number of risk factors have been identified that make older adults more likely to have falls. It has also been shown that when these risk factors are modified, it will help to prevent falls.      Exercise: Regular physical activity or exercise increases body strength and improves balance.     Medication Review: Follow up with your doctor and pharmacist as needed to review your medications and any recent changes that may have been made. They can tell you if there are drug interactions and side-effects. If you are taking any sedatives or sleeping pills, it might be possible to have the dosage decreased, or have the number of medications reduced. These kinds of medications can cause drowsiness and dizziness, thus posing a risk of falling.     Vision Checks: Follow up with an eye doctor at least once a year to have your vision checked.  If you develop symptoms of Shortness of Breath, Chest Pain, Swelling of lips, mouth or tongue or if your condition becomes worse with any new symptoms, see your doctor or return to the Emergency Department for immediate care. Emergency services are not intended to be a substitute for comprehensive medical attention.  Please contact your doctor for follow up if not improving as  expected.   Call your doctor in 5-7 days or as directed if there is no improvement.   Community Resources: *IF YOU ARE IN IMMEDIATE DANGER CALL 911!  Abuse/Neglect:  Family Services Crisis Hotline [facility]): 732-037-1717 [facility] Against Violence [facility]): 445 064 8788  After hours, holidays and weekends: 808-210-6814 National Domestic Violence Hotline: 307 421 7471  Mental Health: [facility] Mental Health: [place]: [phone]  Health Clinics:  [facility]): [phone] Monday - Friday 8 AM - 9 PM, Saturday and Sunday 10 AM - 9 PM  Health  [facility] [place]: [phone] Monday - Friday 8 AM - 5 PM  [facility]  [address]: [phone] Monday- Friday 8:30 AM - 5:30 PM, Sat 9 AM - 1 PM  24 [facility] [place] [facility] [address]: [phone] [facility] [address]: [phone] [facility] [address]: [phone]  24 [facility]: [address] [phone]  Cultures: If culture results are positive, we will notify you if a change in treatment is necessary.  LABORATORY TESTS:         If you had any labs drawn in the ED that have not resulted by the time you are discharged home, we will review these lab results and the treatment given to you.  If there is any further treatment or notification needed, we will contact you by phone, or letter.  "PLEASE ENSURE THAT YOU HAVE GIVEN US YOUR CURRENT WORKING PHONE NUMBER AND YOUR CURRENT ADDRESS, so that we can contact you if needed."  RADIOLOGY TESTS:  If the referred physician wants today\'s x-rays, please call the hospital\'s Radiology Department the day before your doctor\'s appointment. [place]     [phone] [place]   [phone] [place]     [phone]  Our doctors and staff appreciate your choosing [place] for your emergency medical care needs. We are here to serve you

## 2012-02-28 NOTE — ED Provider Notes (Signed)
History     CSN: [id number]  Arrival date & time 02/27/12  1951   First MD Initiated Contact with Patient 02/27/12 2309      Chief Complaint  Patient presents with  . Fall    (Consider location/radiation/quality/duration/timing/severity/associated sxs/prior treatment) HPI 78 year old female presents to emergency department after being found on the floor with presumed fall. Patient is accompanied by her daughter. Patient initially reported low back pain to paramedics upon their arrival, but currently is denying any pain. Patient with history of falls frequently. Daughter reports patient is at her baseline. She has no injuries reported from EMS or the nursing home. Past Medical History  Diagnosis Date  . Anxiety   . Coronary artery disease   . Depression   . Hypertension   . Tachy-brady syndrome   . PAF (paroxysmal atrial fibrillation)   . HX: anticoagulation   . Glaucoma   . Dizziness   . SSS (sick sinus syndrome)   . GERD (gastroesophageal reflux disease)   . Fall   . Osteoarthritis     Past Surgical History  Procedure Date  . Insert / replace / remove pacemaker 01/24/2008    DDD PACEMAKER IMPLANT  . Cholecystectomy   . Gastric bypass   . Breast reduction surgery   . US echocardiography 08/10/2007    EF 60%  . US echocardiography 01/12/2007    EF 70%  . Cardiovascular stress test 02/15/2007  . Total knee arthroplasty     right  . Pacemaker insertion     Family History  Problem Relation Age of Onset  . Kidney failure Mother   . Heart attack Father   . Heart attack Sister   . Heart attack Brother     History  Substance Use Topics  . Smoking status: Former Smoker    Quit date: 07/17/1961  . Smokeless tobacco: Not on file  . Alcohol Use: No    OB History    Grav Para Term Preterm Abortions TAB SAB Ect Mult Living                  Review of Systems  Unable to perform ROS: Dementia    Allergies  Codeine and Iodine  Home Medications   Current  Outpatient Rx  Name Route Sig Dispense Refill  . ACETAMINOPHEN 500 MG PO TABS Oral Take 500-1,000 mg by mouth See admin instructions. Take 1 tablet (500mg ) by mouth 3 times daily. Standing order: Take 2 tablets every 4 hours as needed for 24 hours for fever  up to 101F.    Marland Kitchen ALENDRONATE SODIUM 70 MG PO TABS Oral Take 70 mg by mouth every 7 (seven) days. Take with a full glass of water on an empty stomach. Takes on mondays.    Marland Kitchen ALUM & MAG HYDROXIDE-SIMETH 200-200-20 MG/5ML PO SUSP Oral Take 30 mLs by mouth as needed. Standing order for heartburn /indigestion.Not to exceed 4 doses in 24 hours.    . AMIODARONE HCL 200 MG PO TABS Oral Take 200 mg by mouth daily.      . ASPIRIN 325 MG PO TABS Oral Take 325 mg by mouth daily.    . ATENOLOL 50 MG PO TABS Oral Take 50 mg by mouth daily.      . AZELASTINE HCL 0.05 % OP SOLN Both Eyes Place 1 drop into both eyes daily.     . BUPROPION HCL ER (XL) 300 MG PO TB24 Oral Take 300 mg by mouth daily.      Marland Kitchen  VITAMIN D 1000 UNITS PO TABS Oral Take 1,000 Units by mouth daily.    Marland Kitchen DOCUSATE SODIUM 100 MG PO CAPS Oral Take 100 mg by mouth 2 (two) times daily. For constipation    . DONEPEZIL HCL 10 MG PO TABS Oral Take 10 mg by mouth at bedtime.      . GUAIFENESIN-CODEINE 100-10 MG/5ML PO SYRP Oral Take 10 mLs by mouth 4 (four) times daily as needed. Standing order for cough.    Marland Kitchen HYDROCORTISONE ACETATE 25 MG RE SUPP Rectal Place 25 mg rectally 2 (two) times daily as needed. Inflammation.    Marland Kitchen LATANOPROST 0.005 % OP SOLN Both Eyes Place 1 drop into both eyes daily.     Marland Kitchen LISINOPRIL 5 MG PO TABS Oral Take 5 mg by mouth daily.    Marland Kitchen LOPERAMIDE HCL 2 MG PO TABS Oral Take 2 mg by mouth as needed. With each loose stool. Not to exceed 8 tablets in 24 hours.    Marland Kitchen MAGNESIUM HYDROXIDE 400 MG/5ML PO SUSP Oral Take 30 mLs by mouth at bedtime as needed. Standing order for constipation.    Marland Kitchen MELATONIN 1 MG PO TABS Oral Take 2 tablets by mouth at bedtime as needed. Sleep.    Marland Kitchen  BACITRACIN-NEOMYCIN-POLYMYXIN OINTMENT TUBE Topical Apply 1 application topically as needed. Standing order for skin tear, abrasions or minor irritations. Clean area with normal saline, apply antibiotic ointment, cover with band-aid or gauze, secure gauze with tape. Change as needed until healed.    . OMEPRAZOLE 20 MG PO CPDR Oral Take 20 mg by mouth daily.     Marland Kitchen POTASSIUM CHLORIDE CRYS ER 10 MEQ PO TBCR Oral Take 10 mEq by mouth daily.      . QUETIAPINE FUMARATE 25 MG PO TABS Oral Take 25 mg by mouth 2 (two) times daily.      BP 167/87  Pulse 61  Temp(Src) 97.7 F (36.5 C) (Oral)  Resp 17  SpO2 97%  Physical Exam  Nursing note and vitals reviewed. Constitutional: She appears well-developed and well-nourished.  HENT:  Head: Normocephalic and atraumatic.  Eyes: Conjunctivae and EOM are normal. Pupils are equal, round, and reactive to light.  Neck: Normal range of motion. Neck supple. No JVD present. No tracheal deviation present. No thyromegaly present.  Cardiovascular: Normal rate, regular rhythm, normal heart sounds and intact distal pulses.  Exam reveals no gallop and no friction rub.   No murmur heard. Pulmonary/Chest: Effort normal and breath sounds normal. No stridor. No respiratory distress. She has no wheezes. She has no rales. She exhibits no tenderness.  Abdominal: Soft. Bowel sounds are normal. She exhibits no distension and no mass. There is no tenderness. There is no rebound and no guarding.  Musculoskeletal: She exhibits edema. She exhibits no tenderness.  Lymphadenopathy:    She has no cervical adenopathy.  Neurological: She is alert. She has normal reflexes. She displays normal reflexes. No cranial nerve deficit. She exhibits normal muscle tone. Coordination normal.  Skin: Skin is warm and dry.    ED Course  Procedures (including critical care time)  Labs Reviewed  URINALYSIS, ROUTINE W REFLEX MICROSCOPIC - Abnormal; Notable for the following:    APPearance CLOUDY  (*)    Leukocytes, UA TRACE (*)    All other components within normal limits  URINE MICROSCOPIC-ADD ON - Abnormal; Notable for the following:    Squamous Epithelial / LPF FEW (*)    All other components within normal limits  LAB REPORT -  SCANNED   No results found.   1. Fall       MDM  78 year old female status post fall. Patient examined thoroughly and no significant findings were noted. Discussed with daughter whether she felt radiologic studies requested, and given that patient has no injuries and no complaints, will discharge back to her living facility        Olivia Mackie, MD 02/28/12 [phone]

## 2012-04-10 ENCOUNTER — Encounter (HOSPITAL_COMMUNITY): Payer: Self-pay | Admitting: *Deleted

## 2012-04-10 ENCOUNTER — Inpatient Hospital Stay (HOSPITAL_COMMUNITY)
Admission: EM | Admit: 2012-04-10 | Discharge: 2012-04-12 | DRG: 536 | Disposition: A | Discharge status: Home Health | Payer: Medicare Other | Attending: Internal Medicine | Admitting: Internal Medicine

## 2012-04-10 ENCOUNTER — Emergency Department (HOSPITAL_COMMUNITY): Payer: Medicare Other

## 2012-04-10 DIAGNOSIS — Z87891 Personal history of nicotine dependence: Secondary | ICD-10-CM

## 2012-04-10 DIAGNOSIS — F329 Major depressive disorder, single episode, unspecified: Secondary | ICD-10-CM | POA: Diagnosis present

## 2012-04-10 DIAGNOSIS — S72033A Displaced midcervical fracture of unspecified femur, initial encounter for closed fracture: Principal | ICD-10-CM | POA: Diagnosis present

## 2012-04-10 DIAGNOSIS — E46 Unspecified protein-calorie malnutrition: Secondary | ICD-10-CM | POA: Diagnosis present

## 2012-04-10 DIAGNOSIS — I10 Essential (primary) hypertension: Secondary | ICD-10-CM | POA: Diagnosis present

## 2012-04-10 DIAGNOSIS — F0280 Dementia in other diseases classified elsewhere without behavioral disturbance: Secondary | ICD-10-CM | POA: Diagnosis present

## 2012-04-10 DIAGNOSIS — M810 Age-related osteoporosis without current pathological fracture: Secondary | ICD-10-CM | POA: Diagnosis present

## 2012-04-10 DIAGNOSIS — W06XXXA Fall from bed, initial encounter: Secondary | ICD-10-CM | POA: Diagnosis present

## 2012-04-10 DIAGNOSIS — F3289 Other specified depressive episodes: Secondary | ICD-10-CM | POA: Diagnosis present

## 2012-04-10 DIAGNOSIS — Z96659 Presence of unspecified artificial knee joint: Secondary | ICD-10-CM

## 2012-04-10 DIAGNOSIS — I4891 Unspecified atrial fibrillation: Secondary | ICD-10-CM | POA: Diagnosis present

## 2012-04-10 DIAGNOSIS — W19XXXA Unspecified fall, initial encounter: Secondary | ICD-10-CM

## 2012-04-10 DIAGNOSIS — G309 Alzheimer's disease, unspecified: Secondary | ICD-10-CM | POA: Diagnosis present

## 2012-04-10 DIAGNOSIS — F411 Generalized anxiety disorder: Secondary | ICD-10-CM | POA: Diagnosis present

## 2012-04-10 DIAGNOSIS — I2510 Atherosclerotic heart disease of native coronary artery without angina pectoris: Secondary | ICD-10-CM | POA: Diagnosis present

## 2012-04-10 DIAGNOSIS — M1990 Unspecified osteoarthritis, unspecified site: Secondary | ICD-10-CM | POA: Diagnosis present

## 2012-04-10 DIAGNOSIS — S72001A Fracture of unspecified part of neck of right femur, initial encounter for closed fracture: Secondary | ICD-10-CM

## 2012-04-10 DIAGNOSIS — K219 Gastro-esophageal reflux disease without esophagitis: Secondary | ICD-10-CM | POA: Diagnosis present

## 2012-04-10 DIAGNOSIS — H409 Unspecified glaucoma: Secondary | ICD-10-CM | POA: Diagnosis present

## 2012-04-10 DIAGNOSIS — E876 Hypokalemia: Secondary | ICD-10-CM | POA: Diagnosis present

## 2012-04-10 LAB — POCT I-STAT, CHEM 8
Creatinine, Ser: 1.2 mg/dL — ABNORMAL HIGH (ref 0.50–1.10)
Hemoglobin: 13.6 g/dL (ref 12.0–15.0)
Potassium: 3 mEq/L — ABNORMAL LOW (ref 3.5–5.1)
Sodium: 144 mEq/L (ref 135–145)

## 2012-04-10 NOTE — ED Provider Notes (Signed)
History     CSN: [id number]  Arrival date & time 04/10/12  2109   First MD Initiated Contact with Patient 04/10/12 2124      Chief Complaint  Patient presents with  . Fall    (Consider location/radiation/quality/duration/timing/severity/associated sxs/prior treatment) Patient is a 78 y.o. female presenting with fall. The history is provided by the patient.  Fall The accident occurred 1 to 2 hours ago. Incident: from bed. She fell from a height of 3 to 5 ft. Impact surface: in between carpet and hard floor. The volume of blood lost was minimal. Point of impact: unsure, pt did hit her head. The pain is present in the head and right hip. The pain is mild. She was not ambulatory at the scene. There was no entrapment after the fall. Pertinent negatives include no fever, no abdominal pain, no nausea, no vomiting, no hematuria and no headaches. The symptoms are aggravated by activity. She has tried nothing for the symptoms. The treatment provided no relief.    Past Medical History  Diagnosis Date  . Anxiety   . Coronary artery disease   . Depression   . Hypertension   . Tachy-brady syndrome   . PAF (paroxysmal atrial fibrillation)   . HX: anticoagulation   . Glaucoma   . Dizziness   . SSS (sick sinus syndrome)   . GERD (gastroesophageal reflux disease)   . Fall   . Osteoarthritis     Past Surgical History  Procedure Date  . Insert / replace / remove pacemaker 01/24/2008    DDD PACEMAKER IMPLANT  . Cholecystectomy   . Gastric bypass   . Breast reduction surgery   . US echocardiography 08/10/2007    EF 60%  . US echocardiography 01/12/2007    EF 70%  . Cardiovascular stress test 02/15/2007  . Total knee arthroplasty     right  . Pacemaker insertion     Family History  Problem Relation Age of Onset  . Kidney failure Mother   . Heart attack Father   . Heart attack Sister   . Heart attack Brother     History  Substance Use Topics  . Smoking status: Former Smoker   Quit date: 07/17/1961  . Smokeless tobacco: Not on file  . Alcohol Use: No    OB History    Grav Para Term Preterm Abortions TAB SAB Ect Mult Living                  Review of Systems  Constitutional: Negative for fever and fatigue.  HENT: Negative for congestion, drooling and neck pain.   Eyes: Negative for pain.  Respiratory: Negative for cough and shortness of breath.   Cardiovascular: Negative for chest pain.  Gastrointestinal: Negative for nausea, vomiting, abdominal pain and diarrhea.  Genitourinary: Negative for dysuria and hematuria.  Musculoskeletal: Negative for back pain and gait problem.  Skin: Negative for color change.  Neurological: Negative for dizziness and headaches.  Hematological: Negative for adenopathy.  Psychiatric/Behavioral: Negative for behavioral problems.  All other systems reviewed and are negative.    Allergies  Codeine and Iodine  Home Medications   Current Outpatient Rx  Name Route Sig Dispense Refill  . ACETAMINOPHEN 500 MG PO TABS Oral Take 500-1,000 mg by mouth See admin instructions. Take 1 tablet (500mg ) by mouth 3 times daily. Standing order: Take 2 tablets every 4 hours as needed for 24 hours for fever  up to 101F.    Marland Kitchen ALUM & MAG HYDROXIDE-SIMETH  200-200-20 MG/5ML PO SUSP Oral Take 30 mLs by mouth as needed. Standing order for heartburn /indigestion.Not to exceed 4 doses in 24 hours.    . AMIODARONE HCL 200 MG PO TABS Oral Take 200 mg by mouth daily.      . ASPIRIN 325 MG PO TABS Oral Take 325 mg by mouth daily.    . ATENOLOL 50 MG PO TABS Oral Take 50 mg by mouth daily.      . AZELASTINE HCL 0.05 % OP SOLN Both Eyes Place 1 drop into both eyes daily.     . BUPROPION HCL ER (XL) 300 MG PO TB24 Oral Take 300 mg by mouth daily.      Marland Kitchen VITAMIN D 1000 UNITS PO TABS Oral Take 1,000 Units by mouth daily.    Marland Kitchen DOCUSATE SODIUM 100 MG PO CAPS Oral Take 100 mg by mouth 2 (two) times daily. For constipation    . DONEPEZIL HCL 10 MG PO TABS Oral  Take 10 mg by mouth at bedtime.      . GUAIFENESIN-CODEINE 100-10 MG/5ML PO SYRP Oral Take 10 mLs by mouth 4 (four) times daily as needed. Standing order for cough.    Marland Kitchen HYDROCORTISONE ACETATE 25 MG RE SUPP Rectal Place 25 mg rectally 2 (two) times daily as needed. Inflammation.    Marland Kitchen LATANOPROST 0.005 % OP SOLN Both Eyes Place 1 drop into both eyes daily.     Marland Kitchen LISINOPRIL 5 MG PO TABS Oral Take 5 mg by mouth every morning.     Marland Kitchen LOPERAMIDE HCL 2 MG PO TABS Oral Take 2 mg by mouth as needed. With each loose stool. Not to exceed 8 tablets in 24 hours.    Marland Kitchen MAGNESIUM HYDROXIDE 400 MG/5ML PO SUSP Oral Take 30 mLs by mouth at bedtime as needed. Standing order for constipation.    Marland Kitchen MELATONIN 1 MG PO TABS Oral Take 2 tablets by mouth at bedtime as needed. Sleep.    Marland Kitchen BACITRACIN-NEOMYCIN-POLYMYXIN OINTMENT TUBE Topical Apply 1 application topically as needed. Standing order for skin tear, abrasions or minor irritations. Clean area with normal saline, apply antibiotic ointment, cover with band-aid or gauze, secure gauze with tape. Change as needed until healed.    . OMEPRAZOLE 20 MG PO CPDR Oral Take 20 mg by mouth every morning.     Marland Kitchen POTASSIUM CHLORIDE CRYS ER 10 MEQ PO TBCR Oral Take 10 mEq by mouth every morning.     Marland Kitchen QUETIAPINE FUMARATE 25 MG PO TABS Oral Take 25 mg by mouth 2 (two) times daily.    Marland Kitchen VITAMIN D (ERGOCALCIFEROL) 50000 UNITS PO CAPS Oral Take 50,000 Units by mouth every 30 (thirty) days.      BP 165/90  Pulse 69  Temp(Src) 97.8 F (36.6 C) (Oral)  Resp 20  SpO2 98%  Physical Exam  Constitutional: She appears well-developed and well-nourished.  HENT:  Head: Normocephalic.  Mouth/Throat: No oropharyngeal exudate.  Eyes: Conjunctivae and EOM are normal. Pupils are equal, round, and reactive to light.  Neck: Normal range of motion. Neck supple.       No focal vertebral ttp.  Cardiovascular: Normal rate, regular rhythm, normal heart sounds and intact distal pulses.  Exam reveals  no gallop and no friction rub.   No murmur heard. Pulmonary/Chest: Effort normal and breath sounds normal. No respiratory distress. She has no wheezes.  Abdominal: Soft. Bowel sounds are normal. There is no tenderness.  Musculoskeletal: Normal range of motion. She exhibits no edema  and no tenderness.       Mild hemostatic abrasion to right elbow. Mild ttp of right lateral hip.   Neurological: She is alert. She has normal strength. No sensory deficit.       A/O x2. No oriented to time.  Skin: Skin is warm and dry.  Psychiatric: She has a normal mood and affect. Her behavior is normal.    ED Course  Procedures (including critical care time)  Labs Reviewed - No data to display No results found.   No diagnosis found.    MDM  9:48 PM 78 y.o. female w hx of Alzheimers dementia and hx of falls pw fall from bed. Pt denies loc, does not remember all the events surrounding the fall. Pt AFVSS here, A/O x2, no gross motor/sensory deficits. Will get CT head/neck as pt has pain in occipital region.   11:43 PM Pt found to have right hip fx. Spoke w/ Dr. Charlann Boxer from ortho who plans to eval pt in the morning. Will admit to Dr. Deneen Harts service.   Clinical Impression 1. Femoral neck fracture, right, closed, initial encounter   2. Madelynn Done, MD 04/11/12 0002

## 2012-04-10 NOTE — ED Notes (Signed)
Patient arrives via EMS.  Larey Seat while attempting to get out of bed at the [facility].  C/o right hip and leg pain.  No rotation noted

## 2012-04-10 NOTE — ED Notes (Signed)
EMS reports patient was attempting top get out of the bed at the [facility] and is now c/o right head pain that radiates around to the left side and right hip/ buttock pain.  Patient has history of Alzheimers per EMS

## 2012-04-11 ENCOUNTER — Encounter: Payer: Medicare Other | Admitting: Internal Medicine

## 2012-04-11 ENCOUNTER — Encounter (HOSPITAL_COMMUNITY): Payer: Self-pay | Admitting: *Deleted

## 2012-04-11 LAB — COMPREHENSIVE METABOLIC PANEL
CO2: 25 mEq/L (ref 19–32)
Calcium: 9.3 mg/dL (ref 8.4–10.5)
Creatinine, Ser: 0.97 mg/dL (ref 0.50–1.10)
GFR calc Af Amer: 61 mL/min — ABNORMAL LOW (ref >90)
GFR calc non Af Amer: 53 mL/min — ABNORMAL LOW (ref >90)
Glucose, Bld: 78 mg/dL (ref 70–99)
Total Protein: 5.7 g/dL — ABNORMAL LOW (ref 6.0–8.3)

## 2012-04-11 LAB — CBC
HCT: 38.9 % (ref 36.0–46.0)
Hemoglobin: 13 g/dL (ref 12.0–15.0)
Hemoglobin: 12.8 g/dL (ref 12.0–15.0)
MCH: 30.4 pg (ref 26.0–34.0)
MCH: 30.8 pg (ref 26.0–34.0)
MCHC: 33.4 g/dL (ref 30.0–36.0)
MCHC: 33.4 g/dL (ref 30.0–36.0)
MCV: 92.1 fL (ref 78.0–100.0)
RBC: 4.16 MIL/uL (ref 3.87–5.11)
RDW: 15 % (ref 11.5–15.5)

## 2012-04-11 LAB — CREATININE, SERUM: Creatinine, Ser: 1.04 mg/dL (ref 0.50–1.10)

## 2012-04-11 LAB — PROTIME-INR
INR: 1.12 (ref 0.00–1.49)
Prothrombin Time: 14.6 seconds (ref 11.6–15.2)

## 2012-04-11 LAB — APTT: aPTT: 28 seconds (ref 24–37)

## 2012-04-11 MED ORDER — ASPIRIN 325 MG PO TABS
325.0000 mg | ORAL_TABLET | Freq: Every day | ORAL | Status: DC
  Administered 2012-04-11 – 2012-04-12 (×2): 325 mg via ORAL
  Filled 2012-04-11 (×2): qty 1

## 2012-04-11 MED ORDER — ENSURE COMPLETE PO LIQD
237.0000 mL | Freq: Two times a day (BID) | ORAL | Status: DC
  Administered 2012-04-11 – 2012-04-12 (×2): 237 mL via ORAL

## 2012-04-11 MED ORDER — POTASSIUM CHLORIDE CRYS ER 10 MEQ PO TBCR
10.0000 meq | EXTENDED_RELEASE_TABLET | Freq: Every morning | ORAL | Status: DC
Start: 2012-04-11 — End: 2012-04-12
  Administered 2012-04-11 – 2012-04-12 (×2): 10 meq via ORAL
  Filled 2012-04-11 (×2): qty 1

## 2012-04-11 MED ORDER — VITAMIN D3 25 MCG (1000 UNIT) PO TABS
1000.0000 [IU] | ORAL_TABLET | Freq: Every day | ORAL | Status: DC
  Administered 2012-04-11 – 2012-04-12 (×2): 1000 [IU] via ORAL
  Filled 2012-04-11 (×2): qty 1

## 2012-04-11 MED ORDER — AMIODARONE HCL 200 MG PO TABS
200.0000 mg | ORAL_TABLET | Freq: Every day | ORAL | Status: DC
  Administered 2012-04-11 – 2012-04-12 (×2): 200 mg via ORAL
  Filled 2012-04-11 (×2): qty 1

## 2012-04-11 MED ORDER — SODIUM CHLORIDE 0.9 % IJ SOLN
3.0000 mL | Freq: Two times a day (BID) | INTRAMUSCULAR | Status: DC
  Administered 2012-04-11 – 2012-04-12 (×4): 3 mL via INTRAVENOUS

## 2012-04-11 MED ORDER — ENOXAPARIN SODIUM 40 MG/0.4ML ~~LOC~~ SOLN
40.0000 mg | Freq: Every day | SUBCUTANEOUS | Status: DC
  Administered 2012-04-12: 40 mg via SUBCUTANEOUS
  Filled 2012-04-11 (×2): qty 0.4

## 2012-04-11 MED ORDER — ACETAMINOPHEN 500 MG PO TABS
500.0000 mg | ORAL_TABLET | Freq: Three times a day (TID) | ORAL | Status: DC
  Administered 2012-04-11 – 2012-04-12 (×5): 500 mg via ORAL
  Filled 2012-04-11 (×7): qty 1

## 2012-04-11 MED ORDER — ADULT MULTIVITAMIN W/MINERALS CH
1.0000 | ORAL_TABLET | Freq: Every day | ORAL | Status: DC
  Administered 2012-04-11 – 2012-04-12 (×2): 1 via ORAL
  Filled 2012-04-11 (×2): qty 1

## 2012-04-11 MED ORDER — PANTOPRAZOLE SODIUM 40 MG PO TBEC
40.0000 mg | DELAYED_RELEASE_TABLET | Freq: Every day | ORAL | Status: DC
  Administered 2012-04-11 – 2012-04-12 (×2): 40 mg via ORAL
  Filled 2012-04-11 (×2): qty 1

## 2012-04-11 MED ORDER — DONEPEZIL HCL 10 MG PO TABS
10.0000 mg | ORAL_TABLET | Freq: Every day | ORAL | Status: DC
  Administered 2012-04-11 (×2): 10 mg via ORAL
  Filled 2012-04-11 (×3): qty 1

## 2012-04-11 MED ORDER — ATENOLOL 50 MG PO TABS
50.0000 mg | ORAL_TABLET | Freq: Every day | ORAL | Status: DC
  Administered 2012-04-11 – 2012-04-12 (×2): 50 mg via ORAL
  Filled 2012-04-11 (×2): qty 1

## 2012-04-11 MED ORDER — DOCUSATE SODIUM 100 MG PO CAPS: 100.0000 mg | ORAL_CAPSULE | Freq: Two times a day (BID) | ORAL | Status: DC

## 2012-04-11 MED ORDER — AZELASTINE HCL 0.05 % OP SOLN: 1.0000 [drp] | Freq: Every day | OPHTHALMIC | Status: DC

## 2012-04-11 MED ORDER — POTASSIUM CHLORIDE CRYS ER 20 MEQ PO TBCR
40.0000 meq | EXTENDED_RELEASE_TABLET | Freq: Once | ORAL | Status: AC
  Administered 2012-04-11: 40 meq via ORAL
  Filled 2012-04-11: qty 2

## 2012-04-11 MED ORDER — OLOPATADINE HCL 0.1 % OP SOLN
1.0000 [drp] | Freq: Every day | OPHTHALMIC | Status: DC
  Administered 2012-04-11 – 2012-04-12 (×2): 1 [drp] via OPHTHALMIC
  Filled 2012-04-11: qty 5

## 2012-04-11 MED ORDER — MORPHINE SULFATE 2 MG/ML IJ SOLN
1.0000 mg | INTRAMUSCULAR | Status: DC | PRN
  Administered 2012-04-12: 1 mg via INTRAVENOUS
  Filled 2012-04-11: qty 1

## 2012-04-11 MED ORDER — QUETIAPINE FUMARATE 25 MG PO TABS
25.0000 mg | ORAL_TABLET | Freq: Two times a day (BID) | ORAL | Status: DC
  Administered 2012-04-11 – 2012-04-12 (×4): 25 mg via ORAL
  Filled 2012-04-11 (×5): qty 1

## 2012-04-11 MED ORDER — ONDANSETRON HCL 4 MG PO TABS: 4.0000 mg | ORAL_TABLET | Freq: Four times a day (QID) | ORAL | Status: DC | PRN

## 2012-04-11 MED ORDER — OXYCODONE HCL 5 MG PO TABS
5.0000 mg | ORAL_TABLET | ORAL | Status: DC | PRN
  Administered 2012-04-11 (×3): 5 mg via ORAL
  Filled 2012-04-11 (×3): qty 1

## 2012-04-11 MED ORDER — DOCUSATE SODIUM 100 MG PO CAPS
100.0000 mg | ORAL_CAPSULE | Freq: Two times a day (BID) | ORAL | Status: DC
  Administered 2012-04-11 – 2012-04-12 (×4): 100 mg via ORAL
  Filled 2012-04-11 (×6): qty 1

## 2012-04-11 MED ORDER — BUPROPION HCL ER (XL) 300 MG PO TB24
300.0000 mg | ORAL_TABLET | Freq: Every day | ORAL | Status: DC
  Administered 2012-04-11 – 2012-04-12 (×2): 300 mg via ORAL
  Filled 2012-04-11 (×2): qty 1

## 2012-04-11 MED ORDER — LATANOPROST 0.005 % OP SOLN
1.0000 [drp] | Freq: Every day | OPHTHALMIC | Status: DC
  Administered 2012-04-11 – 2012-04-12 (×2): 1 [drp] via OPHTHALMIC
  Filled 2012-04-11: qty 2.5

## 2012-04-11 MED ORDER — LISINOPRIL 5 MG PO TABS
5.0000 mg | ORAL_TABLET | Freq: Every morning | ORAL | Status: DC
  Administered 2012-04-11 – 2012-04-12 (×2): 5 mg via ORAL
  Filled 2012-04-11 (×2): qty 1

## 2012-04-11 MED ORDER — ONDANSETRON HCL 4 MG/2ML IJ SOLN: 4.0000 mg | Freq: Four times a day (QID) | INTRAMUSCULAR | Status: DC | PRN

## 2012-04-11 NOTE — Progress Notes (Signed)
Clinical Social Work Department CLINICAL SOCIAL WORK PLACEMENT NOTE 04/11/2012  Patient:  Alison Dodson,Alison Dodson  Account Number:  [id number] Admit date:  04/10/2012  Clinical Social Worker:  Dayquan Buys Lubertha Basque  Date/time:  04/11/2012 03:00 PM  Clinical Social Work is seeking post-discharge placement for this patient at the following level of care:   SKILLED NURSING   (*CSW will update this form in Epic as items are completed)   04/11/2012  Patient/family provided with [facility] Department of Clinical Social Work'[place] list of facilities offering this level of care within the geographic area requested by the patient (or if unable, by the patient'Dodson family).  04/11/2012  Patient/family informed of their freedom to choose among providers that offer the needed level of care, that participate in Medicare, Medicaid or managed care program needed by the patient, have an available bed and are willing to accept the patient.  04/11/2012  Patient/family informed of [facility]' ownership interest in [facility], as well as of the fact that they are under no obligation to receive care at this facility.  PASARR submitted to EDS on  PASARR number received from EDS on   FL2 transmitted to all facilities in geographic area requested by pt/family on  04/11/2012 FL2 transmitted to all facilities within larger geographic area on   Patient informed that his/her managed care company has contracts with or will negotiate with  certain facilities, including the following:     Patient/family informed of bed offers received:   Patient chooses bed at  Physician recommends and patient chooses bed at    Patient to be transferred to  on   Patient to be transferred to facility by   The following physician request were entered in Epic:   Additional Comments: Patient'Dodson daughter is no longer wanting SNF placement. CSW faxed patient out to facilities in case patient'Dodson daughter changes her mind.  Sabino Niemann,  MSW, [employer] [phone]

## 2012-04-11 NOTE — Progress Notes (Signed)
INITIAL ADULT NUTRITION ASSESSMENT Date: 04/11/2012   Time: 9:25 AM Reason for Assessment: Low Braden Score  ASSESSMENT: Female 78 y.o.  Dx: Fall  Hx:  Past Medical History  Diagnosis Date  . Anxiety   . Coronary artery disease   . Depression   . Hypertension   . Tachy-brady syndrome   . PAF (paroxysmal atrial fibrillation)   . HX: anticoagulation   . Glaucoma   . Dizziness   . SSS (sick sinus syndrome)   . GERD (gastroesophageal reflux disease)   . Fall   . Osteoarthritis     Related Meds:     . acetaminophen  500 mg Oral Q8H  . amiodarone  200 mg Oral Daily  . aspirin  325 mg Oral Daily  . atenolol  50 mg Oral Daily  . buPROPion  300 mg Oral Daily  . cholecalciferol  1,000 Units Oral Daily  . docusate sodium  100 mg Oral BID  . donepezil  10 mg Oral QHS  . enoxaparin  40 mg Subcutaneous Daily  . latanoprost  1 drop Both Eyes Daily  . lisinopril  5 mg Oral q morning - 10a  . olopatadine  1 drop Both Eyes Daily  . pantoprazole  40 mg Oral Q1200  . potassium chloride  10 mEq Oral q morning - 10a  . potassium chloride  40 mEq Oral Once  . QUEtiapine  25 mg Oral BID  . sodium chloride  3 mL Intravenous Q12H  . DISCONTD: azelastine  1 drop Both Eyes Daily  . DISCONTD: docusate sodium  100 mg Oral BID     Ht: 5\' 6"  (167.6 cm)  Wt: 143 lb 1.3 oz (64.9 kg) (bedscale due to hip fx)  Ideal Wt: 59 kg  % Ideal Wt: 110%  Usual Wt: ~200 lbs per pt report Wt Readings from Last 10 Encounters:  04/11/12 143 lb 1.3 oz (64.9 kg)  01/21/12 200 lb (90.719 kg)  08/02/11 171 lb 6.4 oz (77.747 kg)  02/02/11 192 lb (87.091 kg)    % Usual Wt: 72%  Body mass index is 23.09 kg/(m^2). WNL  Food/Nutrition Related Hx: Pt reports weight loss over the last 3-4 months. Pt denies decreased appetite or intake. Endorses she drinks one Ensure daily.   Labs:  CMP     Component Value Date/Time   NA 143 04/11/2012 0630   K 3.5 04/11/2012 0630   CL 107 04/11/2012 0630   CO2 25  04/11/2012 0630   GLUCOSE 78 04/11/2012 0630   BUN 14 04/11/2012 0630   CREATININE 0.97 04/11/2012 0630   CALCIUM 9.3 04/11/2012 0630   PROT 5.7* 04/11/2012 0630   ALBUMIN 3.2* 04/11/2012 0630   AST 23 04/11/2012 0630   ALT 11 04/11/2012 0630   ALKPHOS 65 04/11/2012 0630   BILITOT 0.4 04/11/2012 0630   GFRNONAA 53* 04/11/2012 0630   GFRAA 61* 04/11/2012 0630     Intake/Output Summary (Last 24 hours) at 04/11/12 1610 Last data filed at 04/11/12 0846  Gross per 24 hour  Intake    240 ml  Output    600 ml  Net   -360 ml     Diet Order: General   Supplements/Tube Feeding: none  IVF:    Estimated Nutritional Needs:   Kcal: 1450-1700 Protein:  68-78 gm Fluid:  1.5-1.7 L  Pt appears to have had 57 lb weight loss in less then 3 months, 28% severe weight loss. Pt has dementia and is unable to provide  much detail. Unsure about pt intake pta, no family available to speak with at time of RD visit. Pt states that she is eating well, but only at 20% of her breakfast this morning. Pt also states that she is weak, appears to have muscle wasting.   Pt meets criteria for severe malnutrition in the context of acute illness 2/2 to weight loss and muscle wasting, and likely poor intake as above.   NUTRITION DIAGNOSIS: -Malnutrition (NI-5.2).  Status: Ongoing  RELATED TO: likely related to dementia  AS EVIDENCE BY: weight loss, muscle wasting  MONITORING/EVALUATION(Goals): Goal: PO intake of meals and supplements to meet >90% of estimated nutrition needs  Monitor: PO intake, weight, labs, I/O's  EDUCATION NEEDS: -No education needs identified at this time  INTERVENTION: 1. Add Ensure Complete BID 2. RD will add multivitamin daily 3. RD will continue to follow  Dietitian [phone]  DOCUMENTATION CODES Per approved criteria  -Severe malnutrition in the context of acute illness or injury    KOWALSKI, Mae Denunzio MARIE 04/11/2012, 9:25 AM

## 2012-04-11 NOTE — Progress Notes (Addendum)
Clinical Social Work Department BRIEF PSYCHOSOCIAL ASSESSMENT 04/11/2012  Patient:  Alison Dodson,Alison Dodson     Account Number:  [id number]     Admit date:  04/10/2012  Clinical Social Worker:  Juliette Mangle  Date/Time:  04/11/2012 03:00 PM  Referred by:  Physician  Date Referred:  04/11/2012 Referred for  SNF Placement   Other Referral:   Interview type:  Patient Other interview type:   Daughter    PSYCHOSOCIAL DATA Living Status:  FACILITY Admitted from facility:  [facility] [address] Level of care:  Assisted Living Primary support name:  Liliane Channel Primary support relationship to patient:  CHILD, ADULT Degree of support available:   Good    CURRENT CONCERNS Current Concerns  Post-Acute Placement   Other Concerns:   Home Health PT, OT    SOCIAL WORK ASSESSMENT / PLAN CSW referral for SNF placement. CSW met with patient and explained role aand discussed SNF placement. Patient became distraught and reported that CSW needs to speak with her daughter. Patient began to cry when she could not remember where she lives.  CSW spoke with patient'Dodson daughter and patient'Dodson daughter reported that she does not want to seek SNF placement for her mother. She does not feel that her mother would benefit from a SNF. She reported that the patient  went to [facility] for 2 weeks and did not get any better. Patient'Dodson daughter would like her mother to return to [facility] and have home health RN, PT, OT from [place]. CSW will continue to follow and assist with dispositiona and all d/c needs.   Assessment/plan status:  Psychosocial Support/Ongoing Assessment of Needs Other assessment/ plan:   Information/referral to community resources:    PATIENT'Dodson/FAMILY'Dodson RESPONSE TO PLAN OF CARE: Patient'Dodson daughter was receptive and appreciative of information and support provided by CSW. CSW will continue to follow   Sabino Niemann, MSW, [place] [phone]

## 2012-04-11 NOTE — H&P (Signed)
PCP:   Gaspar Garbe, MD, MD   Chief Complaint:  Fall  HPI: Patient is an 78 year old female resident of [facility] since earlier this year.  That facility was chosen by her daughter after many concerns regarding her mother having falls at home.  Has had a history of rib fractures, orthopedic issues with her hand and I believe a pelvic fracture as well.  At [facility], due to her level of dementia, she will get up on her own and has had several falls at the facility.  Per notes, her daughter also has a visiting physician service based in [facility] seeing her as well.   This evening she had another fall and subsequently has a R hip fracture.  Ortho differed admission to medicine given her complexities and will evaluate her for possible surgical intervention in the AM.  Review of Systems:  Review of Systems - History obtained from unobtainable from patient due to lack of cooperation Past Medical History: Past Medical History  Diagnosis Date  . Anxiety   . Coronary artery disease   . Depression   . Hypertension   . Tachy-brady syndrome   . PAF (paroxysmal atrial fibrillation)   . HX: anticoagulation   . Glaucoma   . Dizziness   . SSS (sick sinus syndrome)   . GERD (gastroesophageal reflux disease)   . Fall   . Osteoarthritis   Senile dementia, alzheimer's type Past Surgical History  Procedure Date  . Insert / replace / remove pacemaker 01/24/2008    DDD PACEMAKER IMPLANT  . Cholecystectomy   . Gastric bypass   . Breast reduction surgery   . US echocardiography 08/10/2007    EF 60%  . US echocardiography 01/12/2007    EF 70%  . Cardiovascular stress test 02/15/2007  . Total knee arthroplasty     right  . Pacemaker insertion     Medications: Prior to Admission medications   Medication Sig Start Date End Date Taking? Authorizing Provider  acetaminophen (TYLENOL) 500 MG tablet Take 500-1,000 mg by mouth See admin instructions. Take 1 tablet (500mg ) by mouth 3  times daily. Standing order: Take 2 tablets every 4 hours as needed for 24 hours for fever  up to 101F.   Yes Historical Provider, MD  alum & mag hydroxide-simeth (MI-ACID) 200-200-20 MG/5ML suspension Take 30 mLs by mouth as needed. Standing order for heartburn /indigestion.Not to exceed 4 doses in 24 hours.   Yes Historical Provider, MD  amiodarone (PACERONE) 200 MG tablet Take 200 mg by mouth daily.     Yes Historical Provider, MD  aspirin 325 MG tablet Take 325 mg by mouth daily.   Yes Historical Provider, MD  atenolol (TENORMIN) 50 MG tablet Take 50 mg by mouth daily.     Yes Historical Provider, MD  azelastine (OPTIVAR) 0.05 % ophthalmic solution Place 1 drop into both eyes daily.    Yes Historical Provider, MD  buPROPion (WELLBUTRIN XL) 300 MG 24 hr tablet Take 300 mg by mouth daily.     Yes Historical Provider, MD  cholecalciferol (VITAMIN D) 1000 UNITS tablet Take 1,000 Units by mouth daily.   Yes Historical Provider, MD  docusate sodium (STOOL SOFTENER) 100 MG capsule Take 100 mg by mouth 2 (two) times daily. For constipation   Yes Historical Provider, MD  donepezil (ARICEPT) 10 MG tablet Take 10 mg by mouth at bedtime.     Yes Historical Provider, MD  guaiFENesin-codeine [facility] C-NR) 100-10 MG/5ML syrup Take  10 mLs by mouth 4 (four) times daily as needed. Standing order for cough.   Yes Historical Provider, MD  hydrocortisone (ANUSOL-HC) 25 MG suppository Place 25 mg rectally 2 (two) times daily as needed. Inflammation.   Yes Historical Provider, MD  latanoprost (XALATAN) 0.005 % ophthalmic solution Place 1 drop into both eyes daily.    Yes Historical Provider, MD  lisinopril (PRINIVIL,ZESTRIL) 5 MG tablet Take 5 mg by mouth every morning.    Yes Historical Provider, MD  loperamide (IMODIUM A-D) 2 MG tablet Take 2 mg by mouth as needed. With each loose stool. Not to exceed 8 tablets in 24 hours.   Yes Historical Provider, MD  magnesium hydroxide (MILK OF MAGNESIA) 400 MG/5ML suspension  Take 30 mLs by mouth at bedtime as needed. Standing order for constipation.   Yes Historical Provider, MD  Melatonin 1 MG TABS Take 2 tablets by mouth at bedtime as needed. Sleep.   Yes Historical Provider, MD  neomycin-bacitracin-polymyxin (NEOSPORIN) OINT Apply 1 application topically as needed. Standing order for skin tear, abrasions or minor irritations. Clean area with normal saline, apply antibiotic ointment, cover with band-aid or gauze, secure gauze with tape. Change as needed until healed.   Yes Historical Provider, MD  omeprazole (PRILOSEC) 20 MG capsule Take 20 mg by mouth every morning.    Yes Historical Provider, MD  potassium chloride (K-DUR,KLOR-CON) 10 MEQ tablet Take 10 mEq by mouth every morning.    Yes Historical Provider, MD  QUEtiapine (SEROQUEL) 25 MG tablet Take 25 mg by mouth 2 (two) times daily.   Yes Historical Provider, MD  Vitamin D, Ergocalciferol, (DRISDOL) 50000 UNITS CAPS Take 50,000 Units by mouth every 30 (thirty) days.   Yes Historical Provider, MD    Allergies:   Allergies  Allergen Reactions  . Codeine Other (See Comments)    unknown  . Iodine Other (See Comments)    unknown    Social History:  reports that she quit smoking about 50 years ago. She does not have any smokeless tobacco history on file. She reports that she does not drink alcohol or use illicit drugs.  Family History: Family History  Problem Relation Age of Onset  . Kidney failure Mother   . Heart attack Father   . Heart attack Sister   . Heart attack Brother     Physical Exam: Filed Vitals:   04/10/12 2120  BP: 165/90  Pulse: 69  Temp: 97.8 F (36.6 C)  TempSrc: Oral  Resp: 20  SpO2: 98%   General appearance: alert, cooperative and appears stated age Head: Normocephalic, without obvious abnormality, atraumatic Eyes: conjunctivae/corneas clear. PERRL, EOM's intact.  Nose: Nares normal. Septum midline. Mucosa normal. No drainage or sinus tenderness. Throat: lips, mucosa,  and tongue normal; teeth and gums normal Neck: no adenopathy, no carotid bruit, no JVD and thyroid not enlarged, symmetric, no tenderness/mass/nodules Resp: clear to auscultation bilaterally Cardio: regular rate and rhythm, S1, S2 normal, no murmur, click, rub or gallop GI: soft, non-tender; bowel sounds normal; no masses,  no organomegaly Extremities: extremities normal, atraumatic, no cyanosis or edema Pulses: 2+ and symmetric Lymph nodes: Cervical adenopathy: no cervical lymphadenopathy Neurologic:Dementia, poor STM or recall for event   Labs on Admission:   Basename 04/10/12 2351  NA 144  K 3.0*  CL 105  CO2 --  GLUCOSE 89  BUN 16  CREATININE 1.20*  CALCIUM --  MG --  PHOS --   No results found for this basename: AST:2,ALT:2,ALKPHOS:2,BILITOT:2,PROT:2,ALBUMIN:2 in the  last 72 hours No results found for this basename: LIPASE:2,AMYLASE:2 in the last 72 hours  Basename 04/10/12 2351  WBC --  NEUTROABS --  HGB 13.6  HCT 40.0  MCV --  PLT --   No results found for this basename: CKTOTAL:3,CKMB:3,CKMBINDEX:3,TROPONINI:3 in the last 72 hours Lab Results  Component Value Date   INR 1.00 01/21/2012   INR 3.7 11/30/2011   INR 2.8 11/09/2011   No results found for this basename: TSH,T4TOTAL,FREET3,T3FREE,THYROIDAB in the last 72 hours No results found for this basename: VITAMINB12:2,FOLATE:2,FERRITIN:2,TIBC:2,IRON:2,RETICCTPCT:2 in the last 72 hours  Radiological Exams on Admission: Dg Pelvis 1-2 Views  04/10/2012  *RADIOLOGY REPORT*  Clinical Data: Right hip pain status post fall.  PELVIS - 1-2 VIEW  Comparison: 02/01/2012 CT  Findings: Osteopenia.  Suggestion of prior left inferior pubic ramus fracture.  Bilateral hip DJD, right greater than left.  The right femoral head is again noted to appear slightly foreshortened with sclerosis along the inferior margin of the femoral head/neck. Degenerative changes of the lower lumbar spine.  IMPRESSION: No acute fracture identified by  this single view.  Original Report Authenticated By: Waneta Martins, M.D.   Dg Hip Complete Right  04/10/2012  *RADIOLOGY REPORT*  Clinical Data: Fall.  Right hip pain.  RIGHT HIP - COMPLETE 2+ VIEW  Comparison: Right hip x-rays and right hip CT 07/29/2011.  Findings: Mildly impacted subcapital right femoral neck fracture. Severe inferomedial joint space narrowing.  Mild osteopenia.  IMPRESSION: Mildly impacted subcapital right femoral neck fracture.  Original Report Authenticated By: Arnell Sieving, M.D.   Ct Head Wo Contrast  04/10/2012  *RADIOLOGY REPORT*  Clinical Data:  Larey Seat, striking the back of the head.  Occipital headache.  Neck pain.  CT HEAD WITHOUT CONTRAST CT CERVICAL SPINE WITHOUT CONTRAST  Technique:  Multidetector CT imaging of the head and cervical spine was performed following the standard protocol without intravenous contrast.  Multiplanar CT image reconstructions of the cervical spine were also generated.  Comparison:  CT head and cervical spine 01/31/2012, 11/01/2011.  CT HEAD  Findings: Moderate to severe cortical atrophy, moderate deep atrophy, and severe changes of small vessel disease of the white matter diffusely, unchanged.  Old focal stroke in the left superior cerebellar hemisphere, unchanged.  No mass lesion.  No midline shift.  No acute hemorrhage or hematoma.  No extra-axial fluid collections.  No evidence of acute infarction.  Physiologic calcifications in the right basal ganglia.  No significant interval change.  No skull fracture or other focal osseous abnormality involving the skull.  Visualized paranasal sinuses, mastoid air cells, and middle ear cavities well-aerated.  Bilateral carotid siphon and vertebral artery atherosclerosis.  IMPRESSION:  1.  No acute intracranial abnormality. 2.  Stable mild to moderate generalized atrophy, severe chronic microvascular ischemic changes of the white matter, and focal old stroke involving the left superior cerebellar  hemisphere.  CT CERVICAL SPINE  Findings: No fractures identified involving the cervical spine. Facet joints intact with severe degenerative changes throughout. Disc space narrowing and endplate hypertrophic changes at C3-4 and C5-6, with chronic disc protrusion at C5-6.  Desiccated disc material in the right foramen at C3-4.  Severe multilevel bilateral foraminal stenoses due predominately to the severe multilevel facet degenerative changes.  Coronal reformatted images demonstrate an intact craniocervical junction, intact C1-C2 articulation, and intact dens.  IMPRESSION:  1.  No cervical spine fractures identified. 2.  Severe multilevel degenerative changes as detailed above.  Original Report Authenticated By: Arnell Sieving,  M.D.   Ct Cervical Spine Wo Contrast  04/10/2012  *RADIOLOGY REPORT*  Clinical Data:  Larey Seat, striking the back of the head.  Occipital headache.  Neck pain.  CT HEAD WITHOUT CONTRAST CT CERVICAL SPINE WITHOUT CONTRAST  Technique:  Multidetector CT imaging of the head and cervical spine was performed following the standard protocol without intravenous contrast.  Multiplanar CT image reconstructions of the cervical spine were also generated.  Comparison:  CT head and cervical spine 01/31/2012, 11/01/2011.  CT HEAD  Findings: Moderate to severe cortical atrophy, moderate deep atrophy, and severe changes of small vessel disease of the white matter diffusely, unchanged.  Old focal stroke in the left superior cerebellar hemisphere, unchanged.  No mass lesion.  No midline shift.  No acute hemorrhage or hematoma.  No extra-axial fluid collections.  No evidence of acute infarction.  Physiologic calcifications in the right basal ganglia.  No significant interval change.  No skull fracture or other focal osseous abnormality involving the skull.  Visualized paranasal sinuses, mastoid air cells, and middle ear cavities well-aerated.  Bilateral carotid siphon and vertebral artery atherosclerosis.   IMPRESSION:  1.  No acute intracranial abnormality. 2.  Stable mild to moderate generalized atrophy, severe chronic microvascular ischemic changes of the white matter, and focal old stroke involving the left superior cerebellar hemisphere.  CT CERVICAL SPINE  Findings: No fractures identified involving the cervical spine. Facet joints intact with severe degenerative changes throughout. Disc space narrowing and endplate hypertrophic changes at C3-4 and C5-6, with chronic disc protrusion at C5-6.  Desiccated disc material in the right foramen at C3-4.  Severe multilevel bilateral foraminal stenoses due predominately to the severe multilevel facet degenerative changes.  Coronal reformatted images demonstrate an intact craniocervical junction, intact C1-C2 articulation, and intact dens.  IMPRESSION:  1.  No cervical spine fractures identified. 2.  Severe multilevel degenerative changes as detailed above.  Original Report Authenticated By: Arnell Sieving, M.D.   Orders placed during the hospital encounter of 04/10/12  . EKG 12-LEAD  . EKG 12-LEAD    Assessment/Plan R subcapital hip fracture:  Ortho to see, will provide pain control.  She has had issues with HR and pacing but no history of MI.  EKG appears normal, does not require further risk stratification.  Actually had a knee replacement on that side per [facility] last summer. SDAT:  Continue Aricept, also on Seroquel per her visiting physician for behaviors. HTN:  Slightly high, but expected due to pain Depression: As above Glaucoma: Continue eye drops Pacer:  No further risk stratification.  Due to her falls, she was taken off anticoagulation in the past. Osteoporosis:  Clearly by definition.  Medication was attempted in the past.  May be best a Reclast candidate if will accept therapy following healing.  Will check Vit D level, history of high dose replacement. Hypokalemia:  Will replace orally. Social work consult to go over other placement  options with patient's daughter.  Clearly finances and convenience have been an issue, but at her current level of care, she continues to have falls.  Clearly needs to be at either SNF level indefinitely or an Alzheimer's care facility with low beds and bed alarms as falls are often self initiated due to her inability to grasp her physical limitations and ask for help.  PT/OT per Ortho post procedure      Yaritza Leist W 04/11/2012, 12:12 AM

## 2012-04-11 NOTE — Progress Notes (Signed)
Subjective: Eating breakfast.  Pleasantly demented.  Her daughter is not in the room this AM.  She has very little insight except assuming due to her surroundings that she is sick and that her R hip hurts.  Reexplained her reason for hospitalization, but given her SDAT, may not remember long.  Was aware that I was a doctor, but could not name me or the office that I work in (has been a patient since 2011).  Objective: Vital signs in last 24 hours: Temp:  [97.6 F (36.4 C)-98.1 F (36.7 C)] 97.6 F (36.4 C) (05/22 0641) Pulse Rate:  [68-73] 68  (05/22 0641) Resp:  [16-20] 16  (05/22 0641) BP: (100-172)/(71-90) 172/87 mmHg (05/22 0641) SpO2:  [97 %-98 %] 98 % (05/22 0641) Weight:  [64.9 kg (143 lb 1.3 oz)] 64.9 kg (143 lb 1.3 oz) (05/22 0150) Weight change:     Intake/Output from previous day: 05/21 0701 - 05/22 0700 In: 120 [P.O.:120] Out: 600 [Urine:600] Intake/Output this shift:    General appearance: alert, cooperative and appears stated age Resp: clear to auscultation bilaterally Cardio: regular rate and rhythm, S1, S2 normal, no murmur, click, rub or gallop Extremities:R hip palpation, mild outward deviation. Pulses: 2+ and symmetric Neurologic: Grossly normal except for her chronic STM loss due to SDAT.  Lab Results:  Basename 04/11/12 0630 04/11/12 0200  WBC 6.5 6.7  HGB 12.8 13.0  HCT 38.3 38.9  PLT 206 207   BMET  Basename 04/11/12 0200 04/10/12 2351  NA -- 144  K -- 3.0*  CL -- 105  CO2 -- --  GLUCOSE -- 89  BUN -- 16  CREATININE 1.04 1.20*  CALCIUM -- --    Studies/Results: Dg Pelvis 1-2 Views  04/10/2012  *RADIOLOGY REPORT*  Clinical Data: Right hip pain status post fall.  PELVIS - 1-2 VIEW  Comparison: 02/01/2012 CT  Findings: Osteopenia.  Suggestion of prior left inferior pubic ramus fracture.  Bilateral hip DJD, right greater than left.  The right femoral head is again noted to appear slightly foreshortened with sclerosis along the inferior margin  of the femoral head/neck. Degenerative changes of the lower lumbar spine.  IMPRESSION: No acute fracture identified by this single view.  Original Report Authenticated By: Waneta Martins, M.D.   Dg Hip Complete Right  04/10/2012  *RADIOLOGY REPORT*  Clinical Data: Fall.  Right hip pain.  RIGHT HIP - COMPLETE 2+ VIEW  Comparison: Right hip x-rays and right hip CT 07/29/2011.  Findings: Mildly impacted subcapital right femoral neck fracture. Severe inferomedial joint space narrowing.  Mild osteopenia.  IMPRESSION: Mildly impacted subcapital right femoral neck fracture.  Original Report Authenticated By: Arnell Sieving, M.D.   Ct Head Wo Contrast  04/10/2012  *RADIOLOGY REPORT*  Clinical Data:  Larey Seat, striking the back of the head.  Occipital headache.  Neck pain.  CT HEAD WITHOUT CONTRAST CT CERVICAL SPINE WITHOUT CONTRAST  Technique:  Multidetector CT imaging of the head and cervical spine was performed following the standard protocol without intravenous contrast.  Multiplanar CT image reconstructions of the cervical spine were also generated.  Comparison:  CT head and cervical spine 01/31/2012, 11/01/2011.  CT HEAD  Findings: Moderate to severe cortical atrophy, moderate deep atrophy, and severe changes of small vessel disease of the white matter diffusely, unchanged.  Old focal stroke in the left superior cerebellar hemisphere, unchanged.  No mass lesion.  No midline shift.  No acute hemorrhage or hematoma.  No extra-axial fluid collections.  No evidence  of acute infarction.  Physiologic calcifications in the right basal ganglia.  No significant interval change.  No skull fracture or other focal osseous abnormality involving the skull.  Visualized paranasal sinuses, mastoid air cells, and middle ear cavities well-aerated.  Bilateral carotid siphon and vertebral artery atherosclerosis.  IMPRESSION:  1.  No acute intracranial abnormality. 2.  Stable mild to moderate generalized atrophy, severe chronic  microvascular ischemic changes of the white matter, and focal old stroke involving the left superior cerebellar hemisphere.  CT CERVICAL SPINE  Findings: No fractures identified involving the cervical spine. Facet joints intact with severe degenerative changes throughout. Disc space narrowing and endplate hypertrophic changes at C3-4 and C5-6, with chronic disc protrusion at C5-6.  Desiccated disc material in the right foramen at C3-4.  Severe multilevel bilateral foraminal stenoses due predominately to the severe multilevel facet degenerative changes.  Coronal reformatted images demonstrate an intact craniocervical junction, intact C1-C2 articulation, and intact dens.  IMPRESSION:  1.  No cervical spine fractures identified. 2.  Severe multilevel degenerative changes as detailed above.  Original Report Authenticated By: Arnell Sieving, M.D.   Ct Cervical Spine Wo Contrast  04/10/2012  *RADIOLOGY REPORT*  Clinical Data:  Larey Seat, striking the back of the head.  Occipital headache.  Neck pain.  CT HEAD WITHOUT CONTRAST CT CERVICAL SPINE WITHOUT CONTRAST  Technique:  Multidetector CT imaging of the head and cervical spine was performed following the standard protocol without intravenous contrast.  Multiplanar CT image reconstructions of the cervical spine were also generated.  Comparison:  CT head and cervical spine 01/31/2012, 11/01/2011.  CT HEAD  Findings: Moderate to severe cortical atrophy, moderate deep atrophy, and severe changes of small vessel disease of the white matter diffusely, unchanged.  Old focal stroke in the left superior cerebellar hemisphere, unchanged.  No mass lesion.  No midline shift.  No acute hemorrhage or hematoma.  No extra-axial fluid collections.  No evidence of acute infarction.  Physiologic calcifications in the right basal ganglia.  No significant interval change.  No skull fracture or other focal osseous abnormality involving the skull.  Visualized paranasal sinuses, mastoid air  cells, and middle ear cavities well-aerated.  Bilateral carotid siphon and vertebral artery atherosclerosis.  IMPRESSION:  1.  No acute intracranial abnormality. 2.  Stable mild to moderate generalized atrophy, severe chronic microvascular ischemic changes of the white matter, and focal old stroke involving the left superior cerebellar hemisphere.  CT CERVICAL SPINE  Findings: No fractures identified involving the cervical spine. Facet joints intact with severe degenerative changes throughout. Disc space narrowing and endplate hypertrophic changes at C3-4 and C5-6, with chronic disc protrusion at C5-6.  Desiccated disc material in the right foramen at C3-4.  Severe multilevel bilateral foraminal stenoses due predominately to the severe multilevel facet degenerative changes.  Coronal reformatted images demonstrate an intact craniocervical junction, intact C1-C2 articulation, and intact dens.  IMPRESSION:  1.  No cervical spine fractures identified. 2.  Severe multilevel degenerative changes as detailed above.  Original Report Authenticated By: Arnell Sieving, M.D.   Dg Chest Port 1 View  04/11/2012  *RADIOLOGY REPORT*  Clinical Data: Chest pain  PORTABLE CHEST - 1 VIEW  Comparison: 01/31/2012  Findings: Cardiomegaly.  Left chest wall battery pack with dual leads, tips projecting over the right atrium and right ventricle. No focal consolidation, pleural effusion, or pneumothorax.  7 mm nodular opacity projecting over the left mid to upper is favored to reflect a vessel on end when correlated with  prior images. Limited osseous evaluation demonstrates no significant displacement.  IMPRESSION: No radiographic evidence of acute cardiopulmonary process.  Original Report Authenticated By: Waneta Martins, M.D.    Medications:  I have reviewed the patient's current medications. Scheduled:   . acetaminophen  500 mg Oral Q8H  . amiodarone  200 mg Oral Daily  . aspirin  325 mg Oral Daily  . atenolol  50 mg  Oral Daily  . buPROPion  300 mg Oral Daily  . cholecalciferol  1,000 Units Oral Daily  . docusate sodium  100 mg Oral BID  . donepezil  10 mg Oral QHS  . enoxaparin  40 mg Subcutaneous Daily  . latanoprost  1 drop Both Eyes Daily  . lisinopril  5 mg Oral q morning - 10a  . olopatadine  1 drop Both Eyes Daily  . pantoprazole  40 mg Oral Q1200  . potassium chloride  10 mEq Oral q morning - 10a  . potassium chloride  40 mEq Oral Once  . QUEtiapine  25 mg Oral BID  . sodium chloride  3 mL Intravenous Q12H  . DISCONTD: azelastine  1 drop Both Eyes Daily  . DISCONTD: docusate sodium  100 mg Oral BID   Continuous:  [id number] injection, ondansetron (ZOFRAN) IV, ondansetron, oxyCODONE  Assessment/Plan: R subcapital hip fracture: Ortho to see, will provide pain control. She has had issues with HR and pacing but no history of MI. EKG appears normal, does not require further risk stratification. Actually had a knee replacement on that side per [facility] last summer.  SDAT: Continue Aricept, also on Seroquel per her visiting physician for behaviors.  HTN: Slightly high, but expected due to pain  Depression: As above  Glaucoma: Continue eye drops  Pacer: No further risk stratification. Due to her falls, she was taken off anticoagulation in the past.  Osteoporosis: Clearly by definition. Medication was attempted in the past. May be best a Reclast candidate if will accept therapy following healing.Vit D level pending Hypokalemia: Replaced orally, awaiting her AM labs to return to see if needs further replacement. Social work consult to go over other placement options with patient's daughter, who is not currently present.. Clearly finances and convenience have been an issue, but at her current level of care, she continues to have falls. Clearly needs to be at either SNF level indefinitely or an Alzheimer's care facility with low beds and bed alarms as falls are often self initiated due to her inability  to grasp her physical limitations and ask for help. PT/OT per Ortho post procedure   LOS: 1 day   Guadalupe Nickless W 04/11/2012, 7:50 AM

## 2012-04-11 NOTE — Progress Notes (Signed)
UR Completed. Daniele Dillow, RN, Nurse Case Manager [phone]     

## 2012-04-11 NOTE — ED Notes (Signed)
Pt c/o pain.  Holding morphine d/t bp of 100/70.  Will give oxycodone.

## 2012-04-12 LAB — CBC
MCH: 30.3 pg (ref 26.0–34.0)
MCHC: 32.5 g/dL (ref 30.0–36.0)
Platelets: 213 10*3/uL (ref 150–400)
RDW: 15.5 % (ref 11.5–15.5)

## 2012-04-12 LAB — COMPREHENSIVE METABOLIC PANEL
ALT: 10 U/L (ref 0–35)
AST: 19 U/L (ref 0–37)
Albumin: 3 g/dL — ABNORMAL LOW (ref 3.5–5.2)
Calcium: 9.3 mg/dL (ref 8.4–10.5)
GFR calc Af Amer: 52 mL/min — ABNORMAL LOW (ref >90)
Glucose, Bld: 75 mg/dL (ref 70–99)
Sodium: 142 mEq/L (ref 135–145)
Total Protein: 5.6 g/dL — ABNORMAL LOW (ref 6.0–8.3)

## 2012-04-12 MED ORDER — OXYCODONE HCL 5 MG PO TABS: 5.0000 mg | ORAL_TABLET | ORAL | Status: AC | PRN

## 2012-04-12 NOTE — Consult Note (Signed)
Reason for Consult: RIGHT hip pain  Referring Physician: Guerry Bruin, MD  Alison Dodson is an 78 y.o. female.  HPI: 78 yo female with multiple medical issues most pertinent to this admission is that of dementia.  She is unable to follow/remember directions and as a result has multiple falls recently.  She fell the other night while at her current place of residence, [facility], and with subsequent complaints of right hip pain brought to the ER for evaluation.  Right hip fracture, femoral neck, identified and Ortho consulted for treatment recs.  Past Medical History  Diagnosis Date  . Anxiety   . Coronary artery disease   . Depression   . Hypertension   . Tachy-brady syndrome   . PAF (paroxysmal atrial fibrillation)   . HX: anticoagulation   . Glaucoma   . Dizziness   . SSS (sick sinus syndrome)   . GERD (gastroesophageal reflux disease)   . Fall   . Osteoarthritis     Past Surgical History  Procedure Date  . Insert / replace / remove pacemaker 01/24/2008    DDD PACEMAKER IMPLANT  . Cholecystectomy   . Gastric bypass   . Breast reduction surgery   . US echocardiography 08/10/2007    EF 60%  . US echocardiography 01/12/2007    EF 70%  . Cardiovascular stress test 02/15/2007  . Total knee arthroplasty     right  . Pacemaker insertion     Family History  Problem Relation Age of Onset  . Kidney failure Mother   . Heart attack Father   . Heart attack Sister   . Heart attack Brother     Social History:  reports that she quit smoking about 50 years ago. She does not have any smokeless tobacco history on file. She reports that she does not drink alcohol or use illicit drugs.  Allergies:  Allergies  Allergen Reactions  . Codeine Other (See Comments)    unknown  . Iodine Other (See Comments)    unknown    Medications:  I have reviewed the patient's current medications. Scheduled:   . acetaminophen  500 mg Oral Q8H  . amiodarone  200 mg Oral Daily  .  aspirin  325 mg Oral Daily  . atenolol  50 mg Oral Daily  . buPROPion  300 mg Oral Daily  . cholecalciferol  1,000 Units Oral Daily  . docusate sodium  100 mg Oral BID  . donepezil  10 mg Oral QHS  . enoxaparin  40 mg Subcutaneous Daily  . feeding supplement  237 mL Oral BID BM  . latanoprost  1 drop Both Eyes Daily  . lisinopril  5 mg Oral q morning - 10a  . mulitivitamin with minerals  1 tablet Oral Daily  . olopatadine  1 drop Both Eyes Daily  . pantoprazole  40 mg Oral Q1200  . potassium chloride  10 mEq Oral q morning - 10a  . QUEtiapine  25 mg Oral BID  . sodium chloride  3 mL Intravenous Q12H    Results for orders placed during the hospital encounter of 04/10/12 (from the past 24 hour(s))  MRSA PCR SCREENING     Status: Normal   Collection Time   04/12/12 12:34 AM      Component Value Range   MRSA by PCR NEGATIVE  NEGATIVE   CBC     Status: Normal   Collection Time   04/12/12  5:04 AM      Component Value  Range   WBC 6.4  4.0 - 10.5 (K/uL)   RBC 4.19  3.87 - 5.11 (MIL/uL)   Hemoglobin 12.7  12.0 - 15.0 (g/dL)   HCT 16.1  09.6 - 04.5 (%)   MCV 93.3  78.0 - 100.0 (fL)   MCH 30.3  26.0 - 34.0 (pg)   MCHC 32.5  30.0 - 36.0 (g/dL)   RDW 40.9  81.1 - 91.4 (%)   Platelets 213  150 - 400 (K/uL)  COMPREHENSIVE METABOLIC PANEL     Status: Abnormal   Collection Time   04/12/12  5:04 AM      Component Value Range   Sodium 142  135 - 145 (mEq/L)   Potassium 3.8  3.5 - 5.1 (mEq/L)   Chloride 105  96 - 112 (mEq/L)   CO2 27  19 - 32 (mEq/L)   Glucose, Bld 75  70 - 99 (mg/dL)   BUN 17  6 - 23 (mg/dL)   Creatinine, Ser 7.82 (*) 0.50 - 1.10 (mg/dL)   Calcium 9.3  8.4 - 95.6 (mg/dL)   Total Protein 5.6 (*) 6.0 - 8.3 (g/dL)   Albumin 3.0 (*) 3.5 - 5.2 (g/dL)   AST 19  0 - 37 (U/L)   ALT 10  0 - 35 (U/L)   Alkaline Phosphatase 65  39 - 117 (U/L)   Total Bilirubin 0.4  0.3 - 1.2 (mg/dL)   GFR calc non Af Amer 45 (*) >90 (mL/min)   GFR calc Af Amer 52 (*) >90 (mL/min)     X-ray: Dg Hip Complete Right  04/10/2012 *RADIOLOGY REPORT* Clinical Data: Fall. Right hip pain. RIGHT HIP - COMPLETE 2+ VIEW Comparison: Right hip x-rays and right hip CT 07/29/2011. Findings: Mildly impacted subcapital right femoral neck fracture. Severe inferomedial joint space narrowing. Mild osteopenia. IMPRESSION: Mildly impacted subcapital right femoral neck fracture. Original Report Authenticated By: Arnell Sieving, M.D   Recent and frequent falls all that is noted for ROS  Blood pressure 153/82, pulse 66, temperature 97.6 F (36.4 C), temperature source Oral, resp. rate 16, height 5\' 6"  (1.676 m), weight 64.1 kg (141 lb 5 oz), SpO2 97.00%.  PE:  She is awake and alert, but unaware of her situation completely Medical exam deferred to admitting team  Right LE, exam  Healed right knee incision Moves right hip fully without significant pain, passive and active palpable pulses distally  Assessment/Plan: Right hip impacted subcapital femoral neck fracture that appears to be clinically stable based on exam and radiographs  Plan:  I reviewed with her daughter the findings on x-ray as well as her exam findings.  After this review and reviewing treatment options she elected as her power of attorney to proceed in a non-operative fashion, understanding risks of potential displacement of the fracture requiring fixation  Would recommend WBAT on the contralateral hip for transfers, otherwise as best as could be reinforced will have her be PWB RLE Follow up for radiographs in 3-4 weeks Otherwise please call with any questions, [phone] or office [phone]   Edge Mauger D 04/12/2012, 7:47 AM

## 2012-04-12 NOTE — Discharge Summary (Signed)
DISCHARGE SUMMARY  Alison Dodson  MR#: [id number]  DOB:Oct 06, 1928  Date of Admission: 04/10/2012 Date of Discharge: 04/12/2012  Attending Physician:Neill Jurewicz W  Patient's WUJ:WJXBJYN,WGNFAOZ W, MD, MD  Consults:Treatment Team:  Shelda Pal, MD, Orthopedics Discharge Diagnoses: R subcapital hip fracture Senile dementia, alzheimer's type,  Afib with pacer, not coumadin candidate due to falls HTN Hyperlipidemia Protein malnutrition Depression Glaucoma Osteoporosis Hypokalemia, resolved  Discharge Medications: Medication List  As of 04/12/2012  7:09 AM   TAKE these medications         acetaminophen 500 MG tablet   Commonly known as: TYLENOL   Take 500-1,000 mg by mouth See admin instructions. Take 1 tablet (500mg ) by mouth 3 times daily. Standing order: Take 2 tablets every 4 hours as needed for 24 hours for fever  up to 101F.      amiodarone 200 MG tablet   Commonly known as: PACERONE   Take 200 mg by mouth daily.      aspirin 325 MG tablet   Take 325 mg by mouth daily.      atenolol 50 MG tablet   Commonly known as: TENORMIN   Take 50 mg by mouth daily.      azelastine 0.05 % ophthalmic solution   Commonly known as: OPTIVAR   Place 1 drop into both eyes daily.      buPROPion 300 MG 24 hr tablet   Commonly known as: WELLBUTRIN XL   Take 300 mg by mouth daily.      cholecalciferol 1000 UNITS tablet   Commonly known as: VITAMIN D   Take 1,000 Units by mouth daily.      donepezil 10 MG tablet   Commonly known as: ARICEPT   Take 10 mg by mouth at bedtime.      hydrocortisone 25 MG suppository   Commonly known as: ANUSOL-HC   Place 25 mg rectally 2 (two) times daily as needed. Inflammation.      IOPHEN C-NR 100-10 MG/5ML syrup   Generic drug: guaiFENesin-codeine   Take 10 mLs by mouth 4 (four) times daily as needed. Standing order for cough.      latanoprost 0.005 % ophthalmic solution   Commonly known as: XALATAN   Place 1 drop into both eyes  daily.      lisinopril 5 MG tablet   Commonly known as: PRINIVIL,ZESTRIL   Take 5 mg by mouth every morning.      loperamide 2 MG tablet   Commonly known as: IMODIUM A-D   Take 2 mg by mouth as needed. With each loose stool. Not to exceed 8 tablets in 24 hours.      magnesium hydroxide 400 MG/5ML suspension   Commonly known as: MILK OF MAGNESIA   Take 30 mLs by mouth at bedtime as needed. Standing order for constipation.      Melatonin 1 MG Tabs   Take 2 tablets by mouth at bedtime as needed. Sleep.      MI-ACID 200-200-20 MG/5ML suspension   Generic drug: alum & mag hydroxide-simeth   Take 30 mLs by mouth as needed. Standing order for heartburn /indigestion.Not to exceed 4 doses in 24 hours.      neomycin-bacitracin-polymyxin Oint   Commonly known as: NEOSPORIN   Apply 1 application topically as needed. Standing order for skin tear, abrasions or minor irritations. Clean area with normal saline, apply antibiotic ointment, cover with band-aid or gauze, secure gauze with tape. Change as needed until healed.      omeprazole  20 MG capsule   Commonly known as: PRILOSEC   Take 20 mg by mouth every morning.      oxyCODONE 5 MG immediate release tablet   Commonly known as: Oxy IR/ROXICODONE   Take 1 tablet (5 mg total) by mouth every 4 (four) hours as needed.      potassium chloride 10 MEQ tablet   Commonly known as: K-DUR,KLOR-CON   Take 10 mEq by mouth every morning.      QUEtiapine 25 MG tablet   Commonly known as: SEROQUEL   Take 25 mg by mouth 2 (two) times daily.      STOOL SOFTENER 100 MG capsule   Generic drug: docusate sodium   Take 100 mg by mouth 2 (two) times daily. For constipation      Vitamin D (Ergocalciferol) 50000 UNITS Caps   Commonly known as: DRISDOL   Take 50,000 Units by mouth every 30 (thirty) days.            Hospital Procedures: Dg Pelvis 1-2 Views  04/10/2012  *RADIOLOGY REPORT*  Clinical Data: Right hip pain status post fall.  PELVIS - 1-2  VIEW  Comparison: 02/01/2012 CT  Findings: Osteopenia.  Suggestion of prior left inferior pubic ramus fracture.  Bilateral hip DJD, right greater than left.  The right femoral head is again noted to appear slightly foreshortened with sclerosis along the inferior margin of the femoral head/neck. Degenerative changes of the lower lumbar spine.  IMPRESSION: No acute fracture identified by this single view.  Original Report Authenticated By: Waneta Martins, M.D.   Dg Hip Complete Right  04/10/2012  *RADIOLOGY REPORT*  Clinical Data: Fall.  Right hip pain.  RIGHT HIP - COMPLETE 2+ VIEW  Comparison: Right hip x-rays and right hip CT 07/29/2011.  Findings: Mildly impacted subcapital right femoral neck fracture. Severe inferomedial joint space narrowing.  Mild osteopenia.  IMPRESSION: Mildly impacted subcapital right femoral neck fracture.  Original Report Authenticated By: Arnell Sieving, M.D.   Ct Head Wo Contrast  04/10/2012  *RADIOLOGY REPORT*  Clinical Data:  Larey Seat, striking the back of the head.  Occipital headache.  Neck pain.  CT HEAD WITHOUT CONTRAST CT CERVICAL SPINE WITHOUT CONTRAST  Technique:  Multidetector CT imaging of the head and cervical spine was performed following the standard protocol without intravenous contrast.  Multiplanar CT image reconstructions of the cervical spine were also generated.  Comparison:  CT head and cervical spine 01/31/2012, 11/01/2011.  CT HEAD  Findings: Moderate to severe cortical atrophy, moderate deep atrophy, and severe changes of small vessel disease of the white matter diffusely, unchanged.  Old focal stroke in the left superior cerebellar hemisphere, unchanged.  No mass lesion.  No midline shift.  No acute hemorrhage or hematoma.  No extra-axial fluid collections.  No evidence of acute infarction.  Physiologic calcifications in the right basal ganglia.  No significant interval change.  No skull fracture or other focal osseous abnormality involving the skull.   Visualized paranasal sinuses, mastoid air cells, and middle ear cavities well-aerated.  Bilateral carotid siphon and vertebral artery atherosclerosis.  IMPRESSION:  1.  No acute intracranial abnormality. 2.  Stable mild to moderate generalized atrophy, severe chronic microvascular ischemic changes of the white matter, and focal old stroke involving the left superior cerebellar hemisphere.  CT CERVICAL SPINE  Findings: No fractures identified involving the cervical spine. Facet joints intact with severe degenerative changes throughout. Disc space narrowing and endplate hypertrophic changes at C3-4 and C5-6, with chronic disc  protrusion at C5-6.  Desiccated disc material in the right foramen at C3-4.  Severe multilevel bilateral foraminal stenoses due predominately to the severe multilevel facet degenerative changes.  Coronal reformatted images demonstrate an intact craniocervical junction, intact C1-C2 articulation, and intact dens.  IMPRESSION:  1.  No cervical spine fractures identified. 2.  Severe multilevel degenerative changes as detailed above.  Original Report Authenticated By: Arnell Sieving, M.D.   Ct Cervical Spine Wo Contrast  04/10/2012  *RADIOLOGY REPORT*  Clinical Data:  Larey Seat, striking the back of the head.  Occipital headache.  Neck pain.  CT HEAD WITHOUT CONTRAST CT CERVICAL SPINE WITHOUT CONTRAST  Technique:  Multidetector CT imaging of the head and cervical spine was performed following the standard protocol without intravenous contrast.  Multiplanar CT image reconstructions of the cervical spine were also generated.  Comparison:  CT head and cervical spine 01/31/2012, 11/01/2011.  CT HEAD  Findings: Moderate to severe cortical atrophy, moderate deep atrophy, and severe changes of small vessel disease of the white matter diffusely, unchanged.  Old focal stroke in the left superior cerebellar hemisphere, unchanged.  No mass lesion.  No midline shift.  No acute hemorrhage or hematoma.  No  extra-axial fluid collections.  No evidence of acute infarction.  Physiologic calcifications in the right basal ganglia.  No significant interval change.  No skull fracture or other focal osseous abnormality involving the skull.  Visualized paranasal sinuses, mastoid air cells, and middle ear cavities well-aerated.  Bilateral carotid siphon and vertebral artery atherosclerosis.  IMPRESSION:  1.  No acute intracranial abnormality. 2.  Stable mild to moderate generalized atrophy, severe chronic microvascular ischemic changes of the white matter, and focal old stroke involving the left superior cerebellar hemisphere.  CT CERVICAL SPINE  Findings: No fractures identified involving the cervical spine. Facet joints intact with severe degenerative changes throughout. Disc space narrowing and endplate hypertrophic changes at C3-4 and C5-6, with chronic disc protrusion at C5-6.  Desiccated disc material in the right foramen at C3-4.  Severe multilevel bilateral foraminal stenoses due predominately to the severe multilevel facet degenerative changes.  Coronal reformatted images demonstrate an intact craniocervical junction, intact C1-C2 articulation, and intact dens.  IMPRESSION:  1.  No cervical spine fractures identified. 2.  Severe multilevel degenerative changes as detailed above.  Original Report Authenticated By: Arnell Sieving, M.D.   Dg Chest Port 1 View  04/11/2012  *RADIOLOGY REPORT*  Clinical Data: Chest pain  PORTABLE CHEST - 1 VIEW  Comparison: 01/31/2012  Findings: Cardiomegaly.  Left chest wall battery pack with dual leads, tips projecting over the right atrium and right ventricle. No focal consolidation, pleural effusion, or pneumothorax.  7 mm nodular opacity projecting over the left mid to upper is favored to reflect a vessel on end when correlated with prior images. Limited osseous evaluation demonstrates no significant displacement.  IMPRESSION: No radiographic evidence of acute cardiopulmonary  process.  Original Report Authenticated By: Waneta Martins, M.D.    Hospital Course: Andreea Arca was admitted by me on the late evening of the 21st after having another fall at [facility].  She has dementia and does not have the ability to remember directions.  She has had frequent ER visits for falls and is not an anticoagulation candidate due to these.  She was diagnosed with a R subcapital hip fracture.  Dr. Charlann Boxer with Orthopedics was consulted and after discussion with the patient's daughter, a non weight bearing, non surgical approach was preferred by her daughter  Darel Hong, essentially refusing surgery on behalf of her mom, who cannot make her own medical decisions due to her dementia.  She was informed that Enrica must be strictly non weight bearing until ok per Charlann Boxer.  Will have outpatient x-rays per his office to evaluate.  Was told that the best way to do this would be a SNF where she could be monitored.  I have voiced my concern over her level of care at [facility], noting her frequent falls and trips to the ER in the past.  Family refused to consider this and want her back at [facility] with no change in her level of care.  They accept that this is against my medical advice and accept all risk of her hip not healing well because of my concern for her to attempt weight bearing again.  TED hose are ordered for DVT prophylaxis as she is not an anticoagulation candidate given her falls, as she was not prior to this with her Afib.  A bed alarm was also ordered.  In addition, she has protein malnutrition and supplements were ordered as well, which makes me concerned about her diet at the facility.  Day of Discharge Exam BP 153/82  Pulse 66  Temp(Src) 97.6 F (36.4 C) (Oral)  Resp 16  Ht 5\' 6"  (1.676 m)  Wt 64.1 kg (141 lb 5 oz)  BMI 22.81 kg/m2  SpO2 97%  Physical Exam: General appearance: alert, cooperative and appears stated age  Resp: clear to auscultation bilaterally    Cardio: regular rate and rhythm, S1, S2 normal, no murmur, click, rub or gallop  Extremities:R hip palpation, mild outward deviation.  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   Discharge Labs:  [facility] 04/12/12 0504 04/11/12 0630  NA 142 143  K 3.8 3.5  CL 105 107  CO2 27 25  GLUCOSE 75 78  BUN 17 14  CREATININE 1.11* 0.97  CALCIUM 9.3 9.3  MG -- --  PHOS -- --    Basename 04/12/12 0504 04/11/12 0630  AST 19 23  ALT 10 11  ALKPHOS 65 65  BILITOT 0.4 0.4  PROT 5.6* 5.7*  ALBUMIN 3.0* 3.2*    Basename 04/12/12 0504 04/11/12 0630  WBC 6.4 6.5  NEUTROABS -- --  HGB 12.7 12.8  HCT 39.1 38.3  MCV 93.3 92.1  PLT 213 206   Lab Results  Component Value Date   INR 1.12 04/11/2012   INR 1.00 01/21/2012   INR 3.7 11/30/2011   No results found for this basename: CKTOTAL:3,CKMB:3,CKMBINDEX:3,TROPONINI:3 in the last 72 hours No results found for this basename: TSH,T4TOTAL,FREET3,T3FREE,THYROIDAB in the last 72 hours No results found for this basename: VITAMINB12:2,FOLATE:2,FERRITIN:2,TIBC:2,IRON:2,RETICCTPCT:2 in the last 72 hours  Discharge instructions: Discharge Orders    Future Appointments: Provider: Department: Dept Phone: Center:   06/20/2012 2:45 PM Hillis Range, MD [facility] [phone] [id number]     Future Orders Please Complete By Expires   Place TED hose         Disposition: Patient will need a bed alarm and must be non weight bearing with a floor level bed to avoid putting weight on her R hip to allow healing.  Family accepts that failure to do this could cause worsening of her fracture.  Skilled nursing level care was recommended by her physician but was refused by the patient's daughter on her behalf.  Return to [facility] against medical advice of Dr.  Toniyah Dilmore  Follow-up Appts: Follow-up with Dr. Wylene Simmer at [facility] in 2 weeks.  Call for appointment. Follow-up with  Dr. Charlann Boxer at [facility] in 2 weeks.  Call for appointment.    Condition on Discharge: Stable  Signed: Willamina Grieshop W 04/12/2012, 7:09 AM

## 2012-04-12 NOTE — Discharge Instructions (Signed)
Patient will need a bed alarm and must be non weight bearing with a floor level bed to avoid putting weight on her R hip to allow healing.  Family accepts that failure to do this could cause worsening of her fracture.  Skilled nursing level care was recommended by her physician but was refused by the patient's daughter on her behalf.

## 2012-04-12 NOTE — Progress Notes (Signed)
Patient's daughter called and reported that she changed her mind and would like her mother to go to [facility]. CSW reviewed bed offers with the patient's daughter and [facility] did not offer a bed due to acuity. Patient's daughter did not want the patient to go to any off the facilities offering beds.  Patient's daughter is Ok with her mother returning to [facility] with [facility]. CSW will continue to assist with d/c needs.   Sabino Niemann, MSW, [employer] [phone]

## 2012-04-13 NOTE — ED Provider Notes (Signed)
I saw and evaluated the patient, reviewed the resident's note and I agree with the findings and plan.     Nelia Shi, MD 04/13/12 1041

## 2012-06-19 NOTE — Progress Notes (Signed)
ELECTROPHYSIOLOGY OFFICE NOTE  Patient ID: IZEL HOCHBERG MRN: [id number], DOB/AGE: 1928/06/30   Date of Visit: 06/20/2012  Primary Physician: Guerry Bruin, MD Primary Cardiologist: [place], MD Reason for Visit: Device folllow-up  History of Present Illness Ms. Fogelman is an 78 year old woman with PAF and tachy-brady syndrome s/p PPM implantation by Dr. Reyes Ivan in 2009 who presents today for routine device follow-up. She is accompanied by her daughter. She denies any cardiac complaints. She denies CP, SOB, palpitations, dizziness, near syncope or syncope. Of note, she is not on Coumadin due to frequent falls.  Past Medical History  Diagnosis Date  . Anxiety   . Coronary artery disease   . Depression   . Hypertension   . Tachy-brady syndrome   . PAF (paroxysmal atrial fibrillation)   . HX: anticoagulation   . Glaucoma   . Dizziness   . SSS (sick sinus syndrome)   . GERD (gastroesophageal reflux disease)   . Fall   . Osteoarthritis      Past Surgical History  Procedure Date  . Insert / replace / remove pacemaker 01/24/2008    DDD PACEMAKER IMPLANT  . Cholecystectomy   . Gastric bypass   . Breast reduction surgery   . US echocardiography 08/10/2007    EF 60%  . US echocardiography 01/12/2007    EF 70%  . Cardiovascular stress test 02/15/2007  . Total knee arthroplasty     right  . Pacemaker insertion      Allergies/Intolerances Allergies  Allergen Reactions  . Codeine Other (See Comments)    unknown  . Iodine Other (See Comments)    unknown    Current Home Medications Current Outpatient Prescriptions  Medication Sig Dispense Refill  . acetaminophen (TYLENOL) 500 MG tablet Take 500-1,000 mg by mouth See admin instructions. Take 1 tablet (500mg ) by mouth 3 times daily. Standing order: Take 2 tablets every 4 hours as needed for 24 hours for fever  up to 101F.      Marland Kitchen alum & mag hydroxide-simeth (MI-ACID) 200-200-20 MG/5ML suspension Take 30 mLs by mouth as needed.  Standing order for heartburn /indigestion.Not to exceed 4 doses in 24 hours.      Marland Kitchen amiodarone (PACERONE) 200 MG tablet Take 200 mg by mouth daily.        Marland Kitchen aspirin 325 MG tablet Take 325 mg by mouth daily.      Marland Kitchen atenolol (TENORMIN) 50 MG tablet Take 50 mg by mouth daily.        Marland Kitchen azelastine (OPTIVAR) 0.05 % ophthalmic solution Place 1 drop into both eyes daily.       Marland Kitchen buPROPion (WELLBUTRIN XL) 300 MG 24 hr tablet Take 300 mg by mouth daily.        . cholecalciferol (VITAMIN D) 1000 UNITS tablet Take 1,000 Units by mouth daily.      Marland Kitchen docusate sodium (STOOL SOFTENER) 100 MG capsule Take 100 mg by mouth 2 (two) times daily. For constipation      . donepezil (ARICEPT) 10 MG tablet Take 10 mg by mouth at bedtime.        Marland Kitchen guaiFENesin-codeine (IOPHEN C-NR) 100-10 MG/5ML syrup Take 10 mLs by mouth 4 (four) times daily as needed. Standing order for cough.      . hydrocortisone (ANUSOL-HC) 25 MG suppository Place 25 mg rectally 2 (two) times daily as needed. Inflammation.      Marland Kitchen latanoprost (XALATAN) 0.005 % ophthalmic solution Place 1 drop into both eyes daily.       Marland Kitchen  lisinopril (PRINIVIL,ZESTRIL) 5 MG tablet Take 5 mg by mouth every morning.       . loperamide (IMODIUM A-D) 2 MG tablet Take 2 mg by mouth as needed. With each loose stool. Not to exceed 8 tablets in 24 hours.      . magnesium hydroxide (MILK OF MAGNESIA) 400 MG/5ML suspension Take 30 mLs by mouth at bedtime as needed. Standing order for constipation.      . Melatonin 1 MG TABS Take 2 tablets by mouth at bedtime as needed. Sleep.      Marland Kitchen neomycin-bacitracin-polymyxin (NEOSPORIN) OINT Apply 1 application topically as needed. Standing order for skin tear, abrasions or minor irritations. Clean area with normal saline, apply antibiotic ointment, cover with band-aid or gauze, secure gauze with tape. Change as needed until healed.      Marland Kitchen omeprazole (PRILOSEC) 20 MG capsule Take 20 mg by mouth every morning.       . potassium chloride  (K-DUR,KLOR-CON) 10 MEQ tablet Take 10 mEq by mouth every morning.       Marland Kitchen QUEtiapine (SEROQUEL) 25 MG tablet Take 25 mg by mouth 2 (two) times daily.      . Vitamin D, Ergocalciferol, (DRISDOL) 50000 UNITS CAPS Take 50,000 Units by mouth every 30 (thirty) days.        Social History Social History  . Marital Status: Widowed    Spouse Name: N/A    Number of Children: 2   Occupational History  . RETIRED    Social History Main Topics  . Smoking status: Former Smoker    Quit date: 07/17/1961  . Smokeless tobacco: Not on file  . Alcohol Use: No  . Drug Use: No   Review of Systems General: No chills, fever, night sweats or weight changes Cardiovascular: No chest pain, dyspnea on exertion, edema, orthopnea, palpitations, paroxysmal nocturnal dyspnea Dermatological: No rash, lesions or masses Respiratory: No cough, dyspnea Urologic: No hematuria, dysuria Abdominal: No nausea, vomiting, diarrhea, bright red blood per rectum, melena, or hematemesis Neurologic: No visual changes, weakness, changes in mental status All other systems reviewed and are otherwise negative except as noted above.  Physical Exam Blood pressure 146/97, pulse 74, height 5\' 2"  (1.575 m), weight 139 lb 12.8 oz (63.413 kg).  General: Well developed, elderly 78 year old female in no acute distress. HEENT: Normocephalic, atraumatic. EOMs intact. Sclera nonicteric. Oropharynx clear.  Neck: Supple. No JVD. Lungs:  Respirations regular and unlabored, CTA bilaterally. No wheezes, rales or rhonchi. Heart: Regular. S1, S2 present. No murmurs, rub, S3 or S4. Abdomen: Soft, non-distended.  Extremities: No clubbing, cyanosis or edema.  Psych: Normal affect. Neuro: Alert and oriented X 3. Moves all extremities spontaneously.   Diagnostics Device interrogation shows normal PPM function with good battery status and stable lead parameters/measurements; in AF 100% of the time so reprogrammed to VVIR; RV threshold was changed to  1.0V at 0.68ms; no other programming changes were made; see PaceArt report  Assessment and Plan 1. Tachy-brady syndrome s/p PPM - normal device function; programming changes made as outlined above; she is in AF 100% of the time; ? need for amiodarone but will defer to Dr. Johney Frame regarding whether or not she should continue AAD; she is not anticoagulated due to frequent falls; she will keep scheduled follow-up with her primary cardiologist, Dr. Swaziland; she will return to see Dr. Johney Frame in one year unless needed sooner  Ms. Strickland and her daughter expressed verbal understanding and agree with this plan of care. Signed,  EDMISTENNehemiah Settle, PA-C 06/20/2012, 3:00 PM

## 2012-06-20 ENCOUNTER — Ambulatory Visit (INDEPENDENT_AMBULATORY_CARE_PROVIDER_SITE_OTHER): Payer: Medicare Other | Admitting: Cardiology

## 2012-06-20 ENCOUNTER — Encounter: Payer: Self-pay | Admitting: Cardiology

## 2012-06-20 VITALS — BP 146/97 | HR 74 | Ht 62.0 in | Wt 139.8 lb

## 2012-06-20 DIAGNOSIS — I4891 Unspecified atrial fibrillation: Secondary | ICD-10-CM

## 2012-06-20 DIAGNOSIS — I495 Sick sinus syndrome: Secondary | ICD-10-CM

## 2012-06-20 DIAGNOSIS — Z950 Presence of cardiac pacemaker: Secondary | ICD-10-CM

## 2012-06-20 LAB — PACEMAKER DEVICE OBSERVATION
BAMS-0001: 170 {beats}/min
BATTERY VOLTAGE: 2.97 V
BRDY-0002RV: 60 {beats}/min
BRDY-0003RV: 130 {beats}/min
RV LEAD AMPLITUDE: 2.1 mv
RV LEAD THRESHOLD: 1 V

## 2012-06-20 NOTE — Patient Instructions (Signed)
Your physician wants you to follow-up in: 1 year with Dr Allred.  You will receive a reminder letter in the mail two months in advance. If you don't receive a letter, please call our office to schedule the follow-up appointment.  

## 2012-06-25 ENCOUNTER — Emergency Department (HOSPITAL_COMMUNITY)
Admission: EM | Admit: 2012-06-25 | Discharge: 2012-06-25 | Disposition: A | Discharge status: Home / Self Care | Payer: Medicare Other | Attending: Emergency Medicine | Admitting: Emergency Medicine

## 2012-06-25 ENCOUNTER — Encounter (HOSPITAL_COMMUNITY): Payer: Self-pay | Admitting: Emergency Medicine

## 2012-06-25 ENCOUNTER — Emergency Department (HOSPITAL_COMMUNITY): Payer: Medicare Other

## 2012-06-25 DIAGNOSIS — R079 Chest pain, unspecified: Secondary | ICD-10-CM | POA: Insufficient documentation

## 2012-06-25 DIAGNOSIS — I2510 Atherosclerotic heart disease of native coronary artery without angina pectoris: Secondary | ICD-10-CM | POA: Insufficient documentation

## 2012-06-25 DIAGNOSIS — T1490XA Injury, unspecified, initial encounter: Secondary | ICD-10-CM | POA: Insufficient documentation

## 2012-06-25 DIAGNOSIS — I10 Essential (primary) hypertension: Secondary | ICD-10-CM | POA: Insufficient documentation

## 2012-06-25 DIAGNOSIS — M549 Dorsalgia, unspecified: Secondary | ICD-10-CM | POA: Insufficient documentation

## 2012-06-25 DIAGNOSIS — M542 Cervicalgia: Secondary | ICD-10-CM | POA: Insufficient documentation

## 2012-06-25 DIAGNOSIS — M25529 Pain in unspecified elbow: Secondary | ICD-10-CM | POA: Insufficient documentation

## 2012-06-25 DIAGNOSIS — W19XXXA Unspecified fall, initial encounter: Secondary | ICD-10-CM | POA: Insufficient documentation

## 2012-06-25 DIAGNOSIS — F29 Unspecified psychosis not due to a substance or known physiological condition: Secondary | ICD-10-CM | POA: Insufficient documentation

## 2012-06-25 DIAGNOSIS — Z79899 Other long term (current) drug therapy: Secondary | ICD-10-CM | POA: Insufficient documentation

## 2012-06-25 DIAGNOSIS — R51 Headache: Secondary | ICD-10-CM | POA: Insufficient documentation

## 2012-06-25 LAB — COMPREHENSIVE METABOLIC PANEL
ALT: 10 U/L (ref 0–35)
AST: 23 U/L (ref 0–37)
Albumin: 3.7 g/dL (ref 3.5–5.2)
Alkaline Phosphatase: 80 U/L (ref 39–117)
BUN: 16 mg/dL (ref 6–23)
Potassium: 3.6 mEq/L (ref 3.5–5.1)
Sodium: 140 mEq/L (ref 135–145)
Total Protein: 6.8 g/dL (ref 6.0–8.3)

## 2012-06-25 LAB — URINALYSIS, ROUTINE W REFLEX MICROSCOPIC
Hgb urine dipstick: NEGATIVE
Protein, ur: NEGATIVE mg/dL
Urobilinogen, UA: 1 mg/dL (ref 0.0–1.0)

## 2012-06-25 LAB — TYPE AND SCREEN
ABO/RH(D): O POS
Antibody Screen: NEGATIVE

## 2012-06-25 LAB — CBC WITH DIFFERENTIAL/PLATELET
Basophils Absolute: 0 10*3/uL (ref 0.0–0.1)
Basophils Relative: 0 % (ref 0–1)
Eosinophils Absolute: 0.1 10*3/uL (ref 0.0–0.7)
Eosinophils Relative: 1 % (ref 0–5)
Lymphocytes Relative: 17 % (ref 12–46)
MCH: 30.9 pg (ref 26.0–34.0)
MCV: 93 fL (ref 78.0–100.0)
Platelets: 237 10*3/uL (ref 150–400)
RDW: 14.3 % (ref 11.5–15.5)
WBC: 7.5 10*3/uL (ref 4.0–10.5)

## 2012-06-25 LAB — POCT I-STAT TROPONIN I: Troponin i, poc: 0.01 ng/mL (ref 0.00–0.08)

## 2012-06-25 LAB — PROTIME-INR: Prothrombin Time: 14.1 seconds (ref 11.6–15.2)

## 2012-06-25 MED ORDER — SODIUM CHLORIDE 0.9 % IV SOLN
1000.0000 mL | INTRAVENOUS | Status: DC
  Administered 2012-06-25: 1000 mL via INTRAVENOUS

## 2012-06-25 NOTE — ED Notes (Signed)
Dr Lynelle Doctor aware bp to d/c she was given med prior to this vist

## 2012-06-25 NOTE — ED Notes (Signed)
PT here via EMS from [facility] unwitness fall found in the her room on a fall mat now c/o back pain denies loc

## 2012-06-25 NOTE — ED Provider Notes (Addendum)
History     CSN: [id number]  Arrival date & time 06/25/12  1105   First MD Initiated Contact with Patient 06/25/12 1202      Chief Complaint  Patient presents with  . Fall   Level 5 caveat: confusion  HPI Pt fell last night.  She is not sure why she fell.  She was found at the facility she lives at lying on the ground.  Pt thinks she might have had abdominal pain earlier but she cannot tell me   Pt states she has had pain in her head as well as her neck.  She also felt cold.  Pt denies any symptoms at this time.  She would like to be able to go home.  Past Medical History  Diagnosis Date  . Anxiety   . Coronary artery disease   . Depression   . Hypertension   . Tachy-brady syndrome   . PAF (paroxysmal atrial fibrillation)   . HX: anticoagulation   . Glaucoma   . Dizziness   . SSS (sick sinus syndrome)   . GERD (gastroesophageal reflux disease)   . Fall   . Osteoarthritis     Past Surgical History  Procedure Date  . Insert / replace / remove pacemaker 01/24/2008    DDD PACEMAKER IMPLANT  . Cholecystectomy   . Gastric bypass   . Breast reduction surgery   . US echocardiography 08/10/2007    EF 60%  . US echocardiography 01/12/2007    EF 70%  . Cardiovascular stress test 02/15/2007  . Total knee arthroplasty     right  . Pacemaker insertion     Family History  Problem Relation Age of Onset  . Kidney failure Mother   . Heart attack Father   . Heart attack Sister   . Heart attack Brother     History  Substance Use Topics  . Smoking status: Former Smoker    Quit date: 07/17/1961  . Smokeless tobacco: Not on file  . Alcohol Use: No    OB History    Grav Para Term Preterm Abortions TAB SAB Ect Mult Living                  Review of Systems  All other systems reviewed and are negative.    Allergies  Codeine and Iodine  Home Medications   Current Outpatient Rx  Name Route Sig Dispense Refill  . ACETAMINOPHEN 500 MG PO TABS Oral Take 500 mg by  mouth 3 (three) times daily.     Marland Kitchen ALUM & MAG HYDROXIDE-SIMETH 200-200-20 MG/5ML PO SUSP Oral Take 30 mLs by mouth as needed. Standing order for heartburn /indigestion.Not to exceed 4 doses in 24 hours.    . AMIODARONE HCL 200 MG PO TABS Oral Take 200 mg by mouth daily.      . ASPIRIN 325 MG PO TABS Oral Take 325 mg by mouth daily.    . ATENOLOL 50 MG PO TABS Oral Take 50 mg by mouth daily.      . AZELASTINE HCL 0.05 % OP SOLN Both Eyes Place 1 drop into both eyes daily.     . BUPROPION HCL ER (XL) 300 MG PO TB24 Oral Take 300 mg by mouth daily.      Marland Kitchen VITAMIN D 1000 UNITS PO TABS Oral Take 1,000 Units by mouth daily.    Marland Kitchen DOCUSATE SODIUM 100 MG PO CAPS Oral Take 100 mg by mouth 2 (two) times daily. For constipation    .  DONEPEZIL HCL 10 MG PO TABS Oral Take 10 mg by mouth at bedtime.      . GUAIFENESIN-CODEINE 100-10 MG/5ML PO SYRP Oral Take 10 mLs by mouth 4 (four) times daily as needed. Standing order for cough.    Marland Kitchen HYDROCORTISONE ACETATE 25 MG RE SUPP Rectal Place 25 mg rectally 2 (two) times daily as needed. Inflammation.    Marland Kitchen LATANOPROST 0.005 % OP SOLN Both Eyes Place 1 drop into both eyes daily.     Marland Kitchen LISINOPRIL 10 MG PO TABS Oral Take 10 mg by mouth daily.    Marland Kitchen LOPERAMIDE HCL 2 MG PO TABS Oral Take 2 mg by mouth as needed. With each loose stool. Not to exceed 8 tablets in 24 hours.    Marland Kitchen MAGNESIUM HYDROXIDE 400 MG/5ML PO SUSP Oral Take 30 mLs by mouth at bedtime as needed. Standing order for constipation.    Marland Kitchen BACITRACIN-NEOMYCIN-POLYMYXIN OINTMENT TUBE Topical Apply 1 application topically as needed. Standing order for skin tear, abrasions or minor irritations. Clean area with normal saline, apply antibiotic ointment, cover with band-aid or gauze, secure gauze with tape. Change as needed until healed.    . OMEPRAZOLE 20 MG PO CPDR Oral Take 20 mg by mouth every morning.     Marland Kitchen POTASSIUM CHLORIDE CRYS ER 10 MEQ PO TBCR Oral Take 10 mEq by mouth every morning.     Marland Kitchen QUETIAPINE FUMARATE 25  MG PO TABS Oral Take 25 mg by mouth 2 (two) times daily.      BP 154/95  Pulse 59  Temp 98.7 F (37.1 C) (Oral)  Resp 19  SpO2 98%  Physical Exam  Nursing note and vitals reviewed. Constitutional: She appears well-developed and well-nourished. No distress.  HENT:  Head: Normocephalic and atraumatic.  Right Ear: External ear normal.  Left Ear: External ear normal.  Eyes: Conjunctivae are normal. Right eye exhibits no discharge. Left eye exhibits no discharge. No scleral icterus.  Neck: Neck supple. No tracheal deviation present.  Cardiovascular: Normal rate, regular rhythm and intact distal pulses.   Pulmonary/Chest: Effort normal and breath sounds normal. No stridor. No respiratory distress. She has no wheezes. She has no rales.  Abdominal: Soft. Bowel sounds are normal. She exhibits no distension. There is no tenderness. There is no rebound and no guarding.  Musculoskeletal: She exhibits no edema and no tenderness.       Right elbow: She exhibits no swelling, no effusion and no deformity. tenderness found.       Lumbar back: She exhibits tenderness. She exhibits no swelling, no edema and no deformity.  Neurological: She is alert. She has normal strength. No cranial nerve deficit ( no gross defecits noted) or sensory deficit. She exhibits normal muscle tone. She displays no seizure activity. Coordination normal. GCS eye subscore is 4. GCS verbal subscore is 4. GCS motor subscore is 6.       Unsure of date, knows she is in the hospital  Skin: Skin is warm and dry. No rash noted.  Psychiatric: She has a normal mood and affect.    ED Course  Procedures (including critical care time) EKG Rate 62 VENTRICULAR-PACED COMPLEXES ~ other complexes also detected NONSPECIFIC IVCD WITH LAD ~ QRSd >131mS & LAD LVH WITH SECONDARY REPOLARIZATION ABNORMALITY ~ R56L/RISIII/S12R56/S3RL & rep abn No sig change when compared to prior EKG  Labs Reviewed  COMPREHENSIVE METABOLIC PANEL - Abnormal;  Notable for the following:    GFR calc non Af Amer 52 (*)  GFR calc Af Amer 61 (*)     All other components within normal limits  PROTIME-INR  APTT  TYPE AND SCREEN  URINALYSIS, ROUTINE W REFLEX MICROSCOPIC  CBC WITH DIFFERENTIAL  POCT I-STAT TROPONIN I   Dg Chest 2 View  06/25/2012  *RADIOLOGY REPORT*  Clinical Data: Larey Seat.  Chest pain.  CHEST - 2 VIEW  Comparison: 04/10/2012.  Findings: The pacer wires are stable.  The heart is enlarged but unchanged.  There is tortuosity and calcification of the thoracic aorta.  Mild chronic bronchitic type lung changes but no definite acute pulmonary findings. A small hiatal hernia is again demonstrated.  The bony thorax is intact.  Stable degenerative changes involving the thoracic spine.  IMPRESSION: No acute cardiopulmonary findings.  Original Report Authenticated By: P. Loralie Champagne, M.D.   Dg Lumbar Spine Complete  06/25/2012  *RADIOLOGY REPORT*  Clinical Data: Larey Seat.  Back pain.  LUMBAR SPINE - COMPLETE 4+ VIEW  Comparison: CT scan 11/13/2011.  Findings: Stable advanced degenerative lumbar spondylosis with multilevel disc disease and facet disease.  No acute fracture is identified.  The visualized bony pelvis is intact.  The SI joints are grossly normal.  Scattered aortic and iliac artery calcifications but no definite aneurysm.  IMPRESSION:  1.  No acute bony findings. 2.  Advanced degenerative lumbar spondylosis.  Original Report Authenticated By: P. Loralie Champagne, M.D.   Dg Elbow Complete Right  06/25/2012  *RADIOLOGY REPORT*  Clinical Data: Larey Seat.  Right elbow pain.  RIGHT ELBOW - COMPLETE 3+ VIEW  Comparison: None  Findings: The joint spaces are maintained.  Minimal degenerative changes for age.  No acute fracture or joint effusion. Vascular calcifications are noted.  IMPRESSION:  No acute fracture or joint effusion.  Original Report Authenticated By: P. Loralie Champagne, M.D.   Ct Head Wo Contrast  06/25/2012  *RADIOLOGY REPORT*  Clinical Data: Back  pain.  Witnessed fall.  Confusion.  CT HEAD WITHOUT CONTRAST  Technique:  Contiguous axial images were obtained from the base of the skull through the vertex without contrast.  Comparison: 04/10/2012  Findings: There is atrophy and chronic small vessel disease changes. No acute intracranial abnormality.  Specifically, no hemorrhage, hydrocephalus, mass lesion, acute infarction, or significant intracranial injury.  No acute calvarial abnormality. Visualized paranasal sinuses and mastoids clear.  Orbital soft tissues unremarkable.  IMPRESSION: No acute intracranial abnormality.  Atrophy, chronic microvascular disease.  Original Report Authenticated By: Cyndie Chime, M.D.     1. Fall       MDM  Pt without signs of acute injury.  The patient at this time states she just wants to go back home. She is somewhat upset that her daughter is not here. Old records do indicate a history of dementia. At this time there does not appear to be evidence of an acute injury. I reviewed her old records and patient has history of recurrent falls. She is currently residing in a nursing home. He tended to have the patient ambulate here. She is able to bear weight. She is very unsteady but according to her records together she is supposed to be using some type of walker. At this time I do not feel that she requires hospitalization for further treatment and evaluation. They should be able to manage her at the nursing facility. I did contact Dr. Timothy Lasso who is covering for Dr. Wylene Simmer today.  he'll make sure that dr Wylene Simmer is aware and followup with her.       Cletis Athens  Wannetta Sender, MD 06/25/12 1513  Notified of pt's bP. Asymptomatic.  She has been anxious and tearful about going home.  MAy be contributing.  Will have her continue her home BP meds.  Celene Kras, MD 06/25/12 1537

## 2012-06-25 NOTE — ED Notes (Signed)
Red socks and fall risk bracelet placed on pt.  

## 2012-06-25 NOTE — ED Notes (Signed)
ZOX:[id number]<AV> Expected date:06/25/12<BR> Expected time:11:04 AM<BR> Means of arrival:Ambulance<BR> Comments:<BR> 87yoF, fall

## 2012-06-25 NOTE — ED Notes (Signed)
PTAR CALLED.[facility] report given

## 2012-06-25 NOTE — ED Notes (Signed)
Pt. able to bear weight b/l but requires 2 assist to ambulate. MD JKnapp notified

## 2012-07-28 ENCOUNTER — Encounter (HOSPITAL_COMMUNITY): Payer: Self-pay | Admitting: Emergency Medicine

## 2012-07-28 ENCOUNTER — Inpatient Hospital Stay (HOSPITAL_COMMUNITY)
Admission: EM | Admit: 2012-07-28 | Discharge: 2012-07-29 | DRG: 690 | Disposition: A | Discharge status: Skilled Nursing Facility | Payer: Medicare Other | Attending: Family Medicine | Admitting: Family Medicine

## 2012-07-28 ENCOUNTER — Emergency Department (HOSPITAL_COMMUNITY): Payer: Medicare Other

## 2012-07-28 DIAGNOSIS — I2510 Atherosclerotic heart disease of native coronary artery without angina pectoris: Secondary | ICD-10-CM | POA: Diagnosis present

## 2012-07-28 DIAGNOSIS — K219 Gastro-esophageal reflux disease without esophagitis: Secondary | ICD-10-CM | POA: Diagnosis present

## 2012-07-28 DIAGNOSIS — R42 Dizziness and giddiness: Secondary | ICD-10-CM

## 2012-07-28 DIAGNOSIS — F068 Other specified mental disorders due to known physiological condition: Secondary | ICD-10-CM | POA: Diagnosis present

## 2012-07-28 DIAGNOSIS — M76899 Other specified enthesopathies of unspecified lower limb, excluding foot: Secondary | ICD-10-CM | POA: Diagnosis present

## 2012-07-28 DIAGNOSIS — I4891 Unspecified atrial fibrillation: Secondary | ICD-10-CM

## 2012-07-28 DIAGNOSIS — Z79899 Other long term (current) drug therapy: Secondary | ICD-10-CM

## 2012-07-28 DIAGNOSIS — E876 Hypokalemia: Secondary | ICD-10-CM | POA: Diagnosis present

## 2012-07-28 DIAGNOSIS — Z9884 Bariatric surgery status: Secondary | ICD-10-CM

## 2012-07-28 DIAGNOSIS — Z96659 Presence of unspecified artificial knee joint: Secondary | ICD-10-CM

## 2012-07-28 DIAGNOSIS — G309 Alzheimer's disease, unspecified: Secondary | ICD-10-CM | POA: Diagnosis present

## 2012-07-28 DIAGNOSIS — I495 Sick sinus syndrome: Secondary | ICD-10-CM | POA: Diagnosis present

## 2012-07-28 DIAGNOSIS — F0280 Dementia in other diseases classified elsewhere without behavioral disturbance: Secondary | ICD-10-CM | POA: Diagnosis present

## 2012-07-28 DIAGNOSIS — R7989 Other specified abnormal findings of blood chemistry: Secondary | ICD-10-CM

## 2012-07-28 DIAGNOSIS — D72829 Elevated white blood cell count, unspecified: Secondary | ICD-10-CM | POA: Diagnosis present

## 2012-07-28 DIAGNOSIS — I10 Essential (primary) hypertension: Secondary | ICD-10-CM | POA: Diagnosis present

## 2012-07-28 DIAGNOSIS — Z950 Presence of cardiac pacemaker: Secondary | ICD-10-CM

## 2012-07-28 DIAGNOSIS — N390 Urinary tract infection, site not specified: Principal | ICD-10-CM | POA: Diagnosis present

## 2012-07-28 DIAGNOSIS — S72001A Fracture of unspecified part of neck of right femur, initial encounter for closed fracture: Secondary | ICD-10-CM

## 2012-07-28 HISTORY — DX: Unspecified dementia, unspecified severity, without behavioral disturbance, psychotic disturbance, mood disturbance, and anxiety: F03.90

## 2012-07-28 LAB — CK TOTAL AND CKMB (NOT AT ARMC)
CK, MB: 2.3 ng/mL (ref 0.3–4.0)
Total CK: 40 U/L (ref 7–177)

## 2012-07-28 LAB — COMPREHENSIVE METABOLIC PANEL
AST: 17 U/L (ref 0–37)
BUN: 15 mg/dL (ref 6–23)
CO2: 29 mEq/L (ref 19–32)
Calcium: 9.5 mg/dL (ref 8.4–10.5)
Chloride: 105 mEq/L (ref 96–112)
Creatinine, Ser: 1.06 mg/dL (ref 0.50–1.10)
GFR calc Af Amer: 55 mL/min — ABNORMAL LOW (ref >90)
GFR calc non Af Amer: 48 mL/min — ABNORMAL LOW (ref >90)
Glucose, Bld: 76 mg/dL (ref 70–99)
Total Bilirubin: 0.5 mg/dL (ref 0.3–1.2)

## 2012-07-28 LAB — URINALYSIS, ROUTINE W REFLEX MICROSCOPIC
Bilirubin Urine: NEGATIVE
Glucose, UA: NEGATIVE mg/dL
Hgb urine dipstick: NEGATIVE
Protein, ur: NEGATIVE mg/dL

## 2012-07-28 LAB — CBC WITH DIFFERENTIAL/PLATELET
Basophils Relative: 0 % (ref 0–1)
Eosinophils Absolute: 0.1 10*3/uL (ref 0.0–0.7)
MCH: 30.9 pg (ref 26.0–34.0)
MCHC: 33.5 g/dL (ref 30.0–36.0)
Neutrophils Relative %: 65 % (ref 43–77)
Platelets: 218 10*3/uL (ref 150–400)
RDW: 14.1 % (ref 11.5–15.5)

## 2012-07-28 LAB — TROPONIN I: Troponin I: <0.3 ng/mL (ref <0.30)

## 2012-07-28 LAB — URINE MICROSCOPIC-ADD ON

## 2012-07-28 MED ORDER — ATENOLOL 50 MG PO TABS
50.0000 mg | ORAL_TABLET | Freq: Every day | ORAL | Status: DC
  Administered 2012-07-29: 50 mg via ORAL
  Filled 2012-07-28: qty 1

## 2012-07-28 MED ORDER — POTASSIUM CHLORIDE CRYS ER 10 MEQ PO TBCR
10.0000 meq | EXTENDED_RELEASE_TABLET | Freq: Every morning | ORAL | Status: DC
  Administered 2012-07-29: 10 meq via ORAL
  Filled 2012-07-28: qty 1

## 2012-07-28 MED ORDER — BUPROPION HCL ER (XL) 300 MG PO TB24
300.0000 mg | ORAL_TABLET | Freq: Every day | ORAL | Status: DC
  Administered 2012-07-29: 300 mg via ORAL
  Filled 2012-07-28: qty 1

## 2012-07-28 MED ORDER — AZELASTINE HCL 0.05 % OP SOLN: 1.0000 [drp] | Freq: Every day | OPHTHALMIC | Status: DC

## 2012-07-28 MED ORDER — DEXTROSE 5 % IV SOLN: 1.0000 g | Freq: Once | INTRAVENOUS | Status: DC

## 2012-07-28 MED ORDER — ALUM & MAG HYDROXIDE-SIMETH 200-200-20 MG/5ML PO SUSP: 30.0000 mL | ORAL | Status: DC | PRN

## 2012-07-28 MED ORDER — DEXTROSE 5 % IV SOLN
1.0000 g | INTRAVENOUS | Status: DC
  Administered 2012-07-28: 1 g via INTRAVENOUS
  Filled 2012-07-28 (×2): qty 10

## 2012-07-28 MED ORDER — LISINOPRIL 10 MG PO TABS
10.0000 mg | ORAL_TABLET | Freq: Every day | ORAL | Status: DC
  Administered 2012-07-29: 10 mg via ORAL
  Filled 2012-07-28: qty 1

## 2012-07-28 MED ORDER — SODIUM CHLORIDE 0.9 % IJ SOLN: 3.0000 mL | Freq: Two times a day (BID) | INTRAMUSCULAR | Status: DC

## 2012-07-28 MED ORDER — PANTOPRAZOLE SODIUM 40 MG PO TBEC
40.0000 mg | DELAYED_RELEASE_TABLET | Freq: Every day | ORAL | Status: DC
  Administered 2012-07-29: 40 mg via ORAL
  Filled 2012-07-28: qty 1

## 2012-07-28 MED ORDER — SODIUM CHLORIDE 0.9 % IJ SOLN
3.0000 mL | Freq: Two times a day (BID) | INTRAMUSCULAR | Status: DC
  Administered 2012-07-29: 3 mL via INTRAVENOUS

## 2012-07-28 MED ORDER — POTASSIUM CHLORIDE CRYS ER 20 MEQ PO TBCR
20.0000 meq | EXTENDED_RELEASE_TABLET | Freq: Once | ORAL | Status: AC
  Administered 2012-07-28: 20 meq via ORAL
  Filled 2012-07-28 (×2): qty 1

## 2012-07-28 MED ORDER — OXYCODONE HCL 5 MG PO TABS: 5.0000 mg | ORAL_TABLET | Freq: Four times a day (QID) | ORAL | Status: DC | PRN

## 2012-07-28 MED ORDER — CIPROFLOXACIN IN D5W 400 MG/200ML IV SOLN
400.0000 mg | Freq: Two times a day (BID) | INTRAVENOUS | Status: DC
  Administered 2012-07-28: 400 mg via INTRAVENOUS
  Filled 2012-07-28: qty 200

## 2012-07-28 MED ORDER — LATANOPROST 0.005 % OP SOLN
1.0000 [drp] | Freq: Every day | OPHTHALMIC | Status: DC
  Filled 2012-07-28: qty 2.5

## 2012-07-28 MED ORDER — SODIUM CHLORIDE 0.9 % IV SOLN
INTRAVENOUS | Status: DC
  Administered 2012-07-28: 16:00:00 via INTRAVENOUS

## 2012-07-28 MED ORDER — ONDANSETRON HCL 4 MG/2ML IJ SOLN: 4.0000 mg | Freq: Four times a day (QID) | INTRAMUSCULAR | Status: DC | PRN

## 2012-07-28 MED ORDER — AMIODARONE HCL 200 MG PO TABS
200.0000 mg | ORAL_TABLET | Freq: Every day | ORAL | Status: DC
  Administered 2012-07-29: 200 mg via ORAL
  Filled 2012-07-28: qty 1

## 2012-07-28 MED ORDER — QUETIAPINE FUMARATE 25 MG PO TABS
25.0000 mg | ORAL_TABLET | Freq: Two times a day (BID) | ORAL | Status: DC
  Administered 2012-07-28 – 2012-07-29 (×2): 25 mg via ORAL
  Filled 2012-07-28 (×4): qty 1

## 2012-07-28 MED ORDER — ONDANSETRON HCL 4 MG PO TABS: 4.0000 mg | ORAL_TABLET | Freq: Four times a day (QID) | ORAL | Status: DC | PRN

## 2012-07-28 MED ORDER — HYDROCODONE-ACETAMINOPHEN 5-325 MG PO TABS
1.0000 | ORAL_TABLET | Freq: Four times a day (QID) | ORAL | Status: DC | PRN
  Administered 2012-07-28 – 2012-07-29 (×2): 1 via ORAL
  Filled 2012-07-28 (×2): qty 1

## 2012-07-28 MED ORDER — ACETAMINOPHEN 500 MG PO TABS
500.0000 mg | ORAL_TABLET | Freq: Three times a day (TID) | ORAL | Status: DC
  Administered 2012-07-28 – 2012-07-29 (×3): 500 mg via ORAL
  Filled 2012-07-28 (×5): qty 1

## 2012-07-28 MED ORDER — NAPHAZOLINE-PHENIRAMINE 0.025-0.3 % OP SOLN
1.0000 [drp] | Freq: Three times a day (TID) | OPHTHALMIC | Status: DC
  Administered 2012-07-28 – 2012-07-29 (×2): 1 [drp] via OPHTHALMIC
  Filled 2012-07-28 (×2): qty 15

## 2012-07-28 MED ORDER — MAGNESIUM HYDROXIDE 400 MG/5ML PO SUSP: 30.0000 mL | Freq: Every evening | ORAL | Status: DC | PRN

## 2012-07-28 MED ORDER — VITAMIN D3 25 MCG (1000 UNIT) PO TABS
1000.0000 [IU] | ORAL_TABLET | Freq: Every day | ORAL | Status: DC
Start: 2012-07-29 — End: 2012-07-29
  Administered 2012-07-29: 1000 [IU] via ORAL
  Filled 2012-07-28: qty 1

## 2012-07-28 MED ORDER — LOPERAMIDE HCL 2 MG PO CAPS: 2.0000 mg | ORAL_CAPSULE | ORAL | Status: DC | PRN

## 2012-07-28 MED ORDER — SODIUM CHLORIDE 0.9 % IV SOLN: 250.0000 mL | INTRAVENOUS | Status: DC | PRN

## 2012-07-28 MED ORDER — SODIUM CHLORIDE 0.9 % IJ SOLN: 3.0000 mL | INTRAMUSCULAR | Status: DC | PRN

## 2012-07-28 MED ORDER — LOPERAMIDE HCL 2 MG PO TABS: 2.0000 mg | ORAL_TABLET | ORAL | Status: DC | PRN

## 2012-07-28 MED ORDER — ONDANSETRON HCL 4 MG/2ML IJ SOLN: 4.0000 mg | Freq: Three times a day (TID) | INTRAMUSCULAR | Status: DC | PRN

## 2012-07-28 MED ORDER — DONEPEZIL HCL 10 MG PO TABS
10.0000 mg | ORAL_TABLET | Freq: Every day | ORAL | Status: DC
  Administered 2012-07-29: 10 mg via ORAL
  Filled 2012-07-28: qty 1

## 2012-07-28 MED ORDER — SODIUM CHLORIDE 0.9 % IV BOLUS (SEPSIS): 1000.0000 mL | Freq: Once | INTRAVENOUS | Status: DC

## 2012-07-28 MED ORDER — CIPROFLOXACIN HCL 500 MG PO TABS
500.0000 mg | ORAL_TABLET | Freq: Once | ORAL | Status: AC
  Administered 2012-07-28: 500 mg via ORAL
  Filled 2012-07-28: qty 1

## 2012-07-28 NOTE — ED Provider Notes (Signed)
History     CSN: [id number]  Arrival date & time 07/28/12  1049   First MD Initiated Contact with Patient 07/28/12 1116      Chief Complaint  Patient presents with  . Fall    (Consider location/radiation/quality/duration/timing/severity/associated sxs/prior treatment) The history is provided by the nursing home.  Alison Dodson is a 78 y.o. female hx of dementia, GERD, CAD here with s/p fall. She lives in a nursing home and fell. The transfer note from nursing home didn't indicate how she fell and she is demented and unable to remember. She denies fevers, but just felt weak. No vomiting or abdominal pain or chest pain. She had frequent falls and was seen here a month ago. She has occasional headaches and some pain in R elbow.    Level V caveat- dementia  Past Medical History  Diagnosis Date  . Anxiety   . Coronary artery disease   . Depression   . Hypertension   . Tachy-brady syndrome   . PAF (paroxysmal atrial fibrillation)   . HX: anticoagulation   . Glaucoma   . Dizziness   . SSS (sick sinus syndrome)   . GERD (gastroesophageal reflux disease)   . Fall   . Osteoarthritis   . Dementia     Past Surgical History  Procedure Date  . Insert / replace / remove pacemaker 01/24/2008    DDD PACEMAKER IMPLANT  . Cholecystectomy   . Gastric bypass   . Breast reduction surgery   . US echocardiography 08/10/2007    EF 60%  . US echocardiography 01/12/2007    EF 70%  . Cardiovascular stress test 02/15/2007  . Total knee arthroplasty     right  . Pacemaker insertion     Family History  Problem Relation Age of Onset  . Kidney failure Mother   . Heart attack Father   . Heart attack Sister   . Heart attack Brother     History  Substance Use Topics  . Smoking status: Former Smoker    Quit date: 07/17/1961  . Smokeless tobacco: Not on file  . Alcohol Use: No    OB History    Grav Para Term Preterm Abortions TAB SAB Ect Mult Living                  Review of  Systems  Unable to perform ROS: Dementia    Allergies  Codeine and Iodine  Home Medications   Current Outpatient Rx  Name Route Sig Dispense Refill  . ACETAMINOPHEN 500 MG PO TABS Oral Take 500 mg by mouth 3 (three) times daily.     Marland Kitchen ALUM & MAG HYDROXIDE-SIMETH 200-200-20 MG/5ML PO SUSP Oral Take 30 mLs by mouth as needed. Standing order for heartburn /indigestion.Not to exceed 4 doses in 24 hours.    . AMIODARONE HCL 200 MG PO TABS Oral Take 200 mg by mouth daily.     . ASPIRIN 325 MG PO TABS Oral Take 325 mg by mouth daily.    . ATENOLOL 50 MG PO TABS Oral Take 50 mg by mouth daily.      . AZELASTINE HCL 0.05 % OP SOLN Both Eyes Place 1 drop into both eyes daily.     . BUPROPION HCL ER (XL) 300 MG PO TB24 Oral Take 300 mg by mouth daily.      Marland Kitchen VITAMIN D 1000 UNITS PO TABS Oral Take 1,000 Units by mouth daily.    Marland Kitchen DOCUSATE SODIUM 100 MG  PO CAPS Oral Take 100 mg by mouth daily.     . DONEPEZIL HCL 10 MG PO TABS Oral Take 10 mg by mouth daily.     . GUAIFENESIN-CODEINE 100-10 MG/5ML PO SYRP Oral Take 10 mLs by mouth 4 (four) times daily as needed. Standing order for cough.    Marland Kitchen LATANOPROST 0.005 % OP SOLN Both Eyes Place 1 drop into both eyes daily.     Marland Kitchen LISINOPRIL 10 MG PO TABS Oral Take 10 mg by mouth daily.    Marland Kitchen LOPERAMIDE HCL 2 MG PO TABS Oral Take 2 mg by mouth as needed. With each loose stool. Not to exceed 8 tablets in 24 hours.    Marland Kitchen MAGNESIUM HYDROXIDE 400 MG/5ML PO SUSP Oral Take 30 mLs by mouth at bedtime as needed. Standing order for constipation.    Marland Kitchen OMEPRAZOLE 20 MG PO CPDR Oral Take 20 mg by mouth every morning.     Marland Kitchen POTASSIUM CHLORIDE CRYS ER 10 MEQ PO TBCR Oral Take 10 mEq by mouth every morning.     Marland Kitchen QUETIAPINE FUMARATE 25 MG PO TABS Oral Take 25 mg by mouth 2 (two) times daily.      BP 172/109  Pulse 67  Temp 97.4 F (36.3 C) (Oral)  Resp 15  Ht 5\' 6"  (1.676 m)  Wt 150 lb (68.04 kg)  BMI 24.21 kg/m2  SpO2 100%  Physical Exam  Nursing note and vitals  reviewed. Constitutional:       Pleasant, demented  HENT:  Head: Normocephalic.  Mouth/Throat: Oropharynx is clear and moist.       No hematoma, no tenderness  Eyes: Conjunctivae and EOM are normal. Pupils are equal, round, and reactive to light.  Neck: Normal range of motion. Neck supple.  Cardiovascular: Normal rate, regular rhythm and normal heart sounds.   Pulmonary/Chest: Effort normal and breath sounds normal.  Abdominal: Soft. Bowel sounds are normal.  Musculoskeletal: Normal range of motion. She exhibits no edema and no tenderness.       NL R elbow ROM, no tenderness on palpation of RUE. Nl pulses. Mild tenderness over L knee and hip. Neurovascular intact otherwise.   Neurological: She is alert.       Confused, pleasant  Skin: Skin is warm and dry. No rash noted. No erythema.  Psychiatric: She has a normal mood and affect.    ED Course  Procedures (including critical care time)  Labs Reviewed  COMPREHENSIVE METABOLIC PANEL - Abnormal; Notable for the following:    Potassium 3.4 (*)     Total Protein 5.8 (*)     Albumin 3.4 (*)     GFR calc non Af Amer 48 (*)     GFR calc Af Amer 55 (*)     All other components within normal limits  URINALYSIS, ROUTINE W REFLEX MICROSCOPIC - Abnormal; Notable for the following:    APPearance CLOUDY (*)     Nitrite POSITIVE (*)     Leukocytes, UA MODERATE (*)     All other components within normal limits  URINE MICROSCOPIC-ADD ON - Abnormal; Notable for the following:    Bacteria, UA MANY (*)     All other components within normal limits  CBC WITH DIFFERENTIAL  TROPONIN I  CK TOTAL AND CKMB   Dg Chest 1 View  07/28/2012  *RADIOLOGY REPORT*  Clinical Data: Fall.  Altered mental status.  CHEST - 1 VIEW  Comparison: Chest radiograph 06/25/2012  Findings: Left chest wall dual lead  pacer with leads projecting over the right atrium and right ventricle.  Stable cardiomegaly. Small hiatal hernia with an air-fluid level.  A 10 mm radiopaque  density projecting over the hiatal hernia may be a pill fragment within the herniated portion of the stomach.  This density was not present on multiple recent prior chest radiographs.  Pulmonary vascularity is normal and the lung fields are clear. There is no visible pleural effusion or pneumothorax. Cholecystectomy clips.  Visualized bowel gas pattern normal. Degenerative changes of the left glenohumeral joint.  IMPRESSION:  1.  Stable cardiomegaly with dual lead pacemaker.  No acute findings in the chest. 2.  Small hiatal hernia.  Question a radiodense pill fragment within the herniated portion of the stomach.   Original Report Authenticated By: Britta Mccreedy, M.D.    Dg Hip Bilateral W/pelvis  07/28/2012  *RADIOLOGY REPORT*  Clinical Data: Post fall, now with bilateral hip pain  BILATERAL HIP WITH PELVIS - 4+ VIEW  Comparison: None.  Findings:  There is apparent shortening of the right femoral neck which is worrisome for a minimally displaced right subcapsular fracture. No definite fracture of the left hip.  Old left-sided superior and inferior pubic rami fractures.  Mild asymmetric degenerative change of the right hip with joint space loss and axial migration.  Lumbar spine degenerative change.  Visualized bowel gas pattern is normal. Vascular calcifications.  IMPRESSION: 1.  Findings worrisome for a minimally impacted right subcapsular femoral neck fracture.  Further evaluation with a hip CT may be performed as clinically indicated.  2.  Old left sided superior and inferior pubic rami fractures   Original Report Authenticated By: Waynard Reeds, M.D.    Ct Head Wo Contrast  07/28/2012  *RADIOLOGY REPORT*  Clinical Data:  History of trauma from a fall.  Head and neck pain. Dementia.  CT HEAD WITHOUT CONTRAST CT CERVICAL SPINE WITHOUT CONTRAST  Technique:  Multidetector CT imaging of the head and cervical spine was performed following the standard protocol without intravenous contrast.  Multiplanar CT image  reconstructions of the cervical spine were also generated.  Comparison:  Head CT 06/25/2012.  C-spine CT 04/10/2012.  CT HEAD  Findings: Mild cerebral and cerebellar atrophy.  Old infarction in the left cerebellar hemisphere is unchanged.  There are extensive patchy and confluent areas of decreased attenuation throughout the deep and periventricular white matter of the cerebral hemispheres bilaterally, compatible with advanced chronic microvascular ischemic changes. No acute displaced skull fractures are identified.  No acute intracranial abnormality.  Specifically, no evidence of acute post-traumatic intracranial hemorrhage, no definite regions of acute/subacute cerebral ischemia, no focal mass, mass effect, hydrocephalus or abnormal intra or extra-axial fluid collections.  The visualized paranasal sinuses and mastoids are well pneumatized.  IMPRESSION: 1.  No acute displaced skull fractures or findings to suggest significant acute traumatic injury to the head or brain. 2.  Cerebral and cerebellar atrophy with advanced chronic microvascular ischemic changes in the white matter and old left cerebellar infarct redemonstrated, as above.  CT CERVICAL SPINE  Findings: No acute displaced fractures of the cervical spine are appreciated.  There is advanced multilevel degenerative disc disease and severe multilevel facet arthropathy, most severe at C5- C6 where there is 3 mm of anterolisthesis of C5 upon C6.  There is also a 3 mm of anterolisthesis of T1 upon T2.  Both of these levels appears similar to prior study 04/10/2012.  Alignment is otherwise anatomic.  Prevertebral soft tissues are normal.  Visualized portions  of the upper thorax are unremarkable.  IMPRESSION: 1.  No evidence of significant acute traumatic injury to the cervical spine. 2.  Severe multilevel degenerative disc disease and cervical spondylosis redemonstrated, as above.   Original Report Authenticated By: Florencia Reasons, M.D.    Ct Cervical Spine  Wo Contrast  07/28/2012  *RADIOLOGY REPORT*  Clinical Data:  History of trauma from a fall.  Head and neck pain. Dementia.  CT HEAD WITHOUT CONTRAST CT CERVICAL SPINE WITHOUT CONTRAST  Technique:  Multidetector CT imaging of the head and cervical spine was performed following the standard protocol without intravenous contrast.  Multiplanar CT image reconstructions of the cervical spine were also generated.  Comparison:  Head CT 06/25/2012.  C-spine CT 04/10/2012.  CT HEAD  Findings: Mild cerebral and cerebellar atrophy.  Old infarction in the left cerebellar hemisphere is unchanged.  There are extensive patchy and confluent areas of decreased attenuation throughout the deep and periventricular white matter of the cerebral hemispheres bilaterally, compatible with advanced chronic microvascular ischemic changes. No acute displaced skull fractures are identified.  No acute intracranial abnormality.  Specifically, no evidence of acute post-traumatic intracranial hemorrhage, no definite regions of acute/subacute cerebral ischemia, no focal mass, mass effect, hydrocephalus or abnormal intra or extra-axial fluid collections.  The visualized paranasal sinuses and mastoids are well pneumatized.  IMPRESSION: 1.  No acute displaced skull fractures or findings to suggest significant acute traumatic injury to the head or brain. 2.  Cerebral and cerebellar atrophy with advanced chronic microvascular ischemic changes in the white matter and old left cerebellar infarct redemonstrated, as above.  CT CERVICAL SPINE  Findings: No acute displaced fractures of the cervical spine are appreciated.  There is advanced multilevel degenerative disc disease and severe multilevel facet arthropathy, most severe at C5- C6 where there is 3 mm of anterolisthesis of C5 upon C6.  There is also a 3 mm of anterolisthesis of T1 upon T2.  Both of these levels appears similar to prior study 04/10/2012.  Alignment is otherwise anatomic.  Prevertebral soft  tissues are normal.  Visualized portions of the upper thorax are unremarkable.  IMPRESSION: 1.  No evidence of significant acute traumatic injury to the cervical spine. 2.  Severe multilevel degenerative disc disease and cervical spondylosis redemonstrated, as above.   Original Report Authenticated By: Florencia Reasons, M.D.    Dg Knee Complete 4 Views Left  07/28/2012  *RADIOLOGY REPORT*  Clinical Data: Fall.  Pain.  LEFT KNEE - COMPLETE 4+ VIEW  Comparison: Right knee radiographs 07/29/2011  Findings: Marked osteopenia.  Surgical changes of total right knee arthroplasty.  No acute fracture is identified.  Prominent fabella noted.  No evidence of joint effusion or focal soft tissue swelling.  IMPRESSION:  1.  Marked osteopenia.  No acute bony abnormality identified. 2.  Total knee arthroplasty.  No hardware complication identified.   Original Report Authenticated By: Britta Mccreedy, M.D.      1. UTI (urinary tract infection)   2. Fall      Date: 07/28/2012  Rate: 62  Rhythm: paced  QRS Axis: normal  Intervals: normal  ST/T Wave abnormalities: normal  Conduction Disutrbances:paced  Narrative Interpretation: + PVC and LVH  Old EKG Reviewed: unchanged    MDM  Alison Dodson is a 78 y.o. female hx of dementia from nursing home here with fall. Will do CT head/neck, check labs, cxr, pelvis xray and UA. Will reassess.   2:26 PM Patient feels about the same. EKG is  paced, trop neg x 1, low likelihood of cardiac syncope. CT head/neck showed no fracture or bleed. She has R scapular femoral neck fracture on xray. UA showed +UTI, and patient given cipro. I called ortho (Dr. Magnus Ivan) and admitted the patient to tele under Dr. Cena Benton.            Richardean Canal, MD 07/28/12 [phone]

## 2012-07-28 NOTE — Consult Note (Signed)
Reason for Consult:  ?  Hip fracture Referring Physician:  [name] is an 78 y.o. female.  HPI: Hx of multiple falls, [facility].    Plain pelvic xray ? fx femoral neck.   Past Medical History  Diagnosis Date  . Anxiety   . Coronary artery disease   . Depression   . Hypertension   . Tachy-brady syndrome   . PAF (paroxysmal atrial fibrillation)   . HX: anticoagulation   . Glaucoma   . Dizziness   . SSS (sick sinus syndrome)   . GERD (gastroesophageal reflux disease)   . Fall   . Osteoarthritis   . Dementia     Past Surgical History  Procedure Date  . Insert / replace / remove pacemaker 01/24/2008    DDD PACEMAKER IMPLANT  . Cholecystectomy   . Gastric bypass   . Breast reduction surgery   . US echocardiography 08/10/2007    EF 60%  . US echocardiography 01/12/2007    EF 70%  . Cardiovascular stress test 02/15/2007  . Total knee arthroplasty     right  . Pacemaker insertion     Family History  Problem Relation Age of Onset  . Kidney failure Mother   . Heart attack Father   . Heart attack Sister   . Heart attack Brother     Social History:  reports that she quit smoking about 51 years ago. She does not have any smokeless tobacco history on file. She reports that she does not drink alcohol or use illicit drugs.  Allergies:  Allergies  Allergen Reactions  . Codeine Other (See Comments)    unknown  . Iodine Other (See Comments)    unknown    Medications: I have reviewed the patient's current medications.  Results for orders placed during the hospital encounter of 07/28/12 (from the past 48 hour(s))  URINALYSIS, ROUTINE W REFLEX MICROSCOPIC     Status: Abnormal   Collection Time   07/28/12 11:32 AM      Component Value Range Comment   Color, Urine YELLOW  YELLOW    APPearance CLOUDY (*) CLEAR    Specific Gravity, Urine 1.022  1.005 - 1.030    pH 5.5  5.0 - 8.0    Glucose, UA NEGATIVE  NEGATIVE mg/dL    Hgb urine  dipstick NEGATIVE  NEGATIVE    Bilirubin Urine NEGATIVE  NEGATIVE    Ketones, ur NEGATIVE  NEGATIVE mg/dL    Protein, ur NEGATIVE  NEGATIVE mg/dL    Urobilinogen, UA 1.0  0.0 - 1.0 mg/dL    Nitrite POSITIVE (*) NEGATIVE    Leukocytes, UA MODERATE (*) NEGATIVE   URINE MICROSCOPIC-ADD ON     Status: Abnormal   Collection Time   07/28/12 11:32 AM      Component Value Range Comment   Squamous Epithelial / LPF RARE  RARE    WBC, UA 11-20  <3 WBC/hpf    Bacteria, UA MANY (*) RARE   CBC WITH DIFFERENTIAL     Status: Normal   Collection Time   07/28/12 12:33 PM      Component Value Range Comment   WBC 5.7  4.0 - 10.5 K/uL    RBC 4.37  3.87 - 5.11 MIL/uL    Hemoglobin 13.5  12.0 - 15.0 g/dL    HCT 47.8  29.5 - 62.1 %    MCV 92.2  78.0 - 100.0 fL    MCH 30.9  26.0 - 34.0 pg    MCHC 33.5  30.0 - 36.0 g/dL    RDW 40.9  81.1 - 91.4 %    Platelets 218  150 - 400 K/uL    Neutrophils Relative 65  43 - 77 %    Neutro Abs 3.7  1.7 - 7.7 K/uL    Lymphocytes Relative 25  12 - 46 %    Lymphs Abs 1.4  0.7 - 4.0 K/uL    Monocytes Relative 8  3 - 12 %    Monocytes Absolute 0.4  0.1 - 1.0 K/uL    Eosinophils Relative 2  0 - 5 %    Eosinophils Absolute 0.1  0.0 - 0.7 K/uL    Basophils Relative 0  0 - 1 %    Basophils Absolute 0.0  0.0 - 0.1 K/uL   COMPREHENSIVE METABOLIC PANEL     Status: Abnormal   Collection Time   07/28/12 12:33 PM      Component Value Range Comment   Sodium 142  135 - 145 mEq/L    Potassium 3.4 (*) 3.5 - 5.1 mEq/L    Chloride 105  96 - 112 mEq/L    CO2 29  19 - 32 mEq/L    Glucose, Bld 76  70 - 99 mg/dL    BUN 15  6 - 23 mg/dL    Creatinine, Ser 7.82  0.50 - 1.10 mg/dL    Calcium 9.5  8.4 - 95.6 mg/dL    Total Protein 5.8 (*) 6.0 - 8.3 g/dL    Albumin 3.4 (*) 3.5 - 5.2 g/dL    AST 17  0 - 37 U/L    ALT 9  0 - 35 U/L    Alkaline Phosphatase 75  39 - 117 U/L    Total Bilirubin 0.5  0.3 - 1.2 mg/dL    GFR calc non Af Amer 48 (*) >90 mL/min    GFR calc Af Amer 55 (*) >90  mL/min   TROPONIN I     Status: Normal   Collection Time   07/28/12 12:33 PM      Component Value Range Comment   Troponin I <0.30  <0.30 ng/mL   CK TOTAL AND CKMB     Status: Normal   Collection Time   07/28/12 12:33 PM      Component Value Range Comment   Total CK 40  7 - 177 U/L    CK, MB 2.3  0.3 - 4.0 ng/mL    Relative Index RELATIVE INDEX IS INVALID  0.0 - 2.5     Dg Chest 1 View  07/28/2012  *RADIOLOGY REPORT*  Clinical Data: Fall.  Altered mental status.  CHEST - 1 VIEW  Comparison: Chest radiograph 06/25/2012  Findings: Left chest wall dual lead pacer with leads projecting over the right atrium and right ventricle.  Stable cardiomegaly. Small hiatal hernia with an air-fluid level.  A 10 mm radiopaque density projecting over the hiatal hernia may be a pill fragment within the herniated portion of the stomach.  This density was not present on multiple recent prior chest radiographs.  Pulmonary vascularity is normal and the lung fields are clear. There is no visible pleural effusion or pneumothorax. Cholecystectomy clips.  Visualized bowel gas pattern normal. Degenerative changes of the left glenohumeral joint.  IMPRESSION:  1.  Stable cardiomegaly with dual lead pacemaker.  No acute findings in the chest. 2.  Small hiatal hernia.  Question a radiodense pill fragment within the  herniated portion of the stomach.   Original Report Authenticated By: [facility], M.D.    Dg Hip Bilateral W/pelvis  07/28/2012  *RADIOLOGY REPORT*  Clinical Data: Post fall, now with bilateral hip pain  BILATERAL HIP WITH PELVIS - 4+ VIEW  Comparison: None.  Findings:  There is apparent shortening of the right femoral neck which is worrisome for a minimally displaced right subcapsular fracture. No definite fracture of the left hip.  Old left-sided superior and inferior pubic rami fractures.  Mild asymmetric degenerative change of the right hip with joint space loss and axial migration.  Lumbar spine degenerative change.   Visualized bowel gas pattern is normal. Vascular calcifications.  IMPRESSION: 1.  Findings worrisome for a minimally impacted right subcapsular femoral neck fracture.  Further evaluation with a hip CT may be performed as clinically indicated.  2.  Old left sided superior and inferior pubic rami fractures   Original Report Authenticated By: Waynard Reeds, M.D.    Ct Head Wo Contrast  07/28/2012  *RADIOLOGY REPORT*  Clinical Data:  History of trauma from a fall.  Head and neck pain. Dementia.  CT HEAD WITHOUT CONTRAST CT CERVICAL SPINE WITHOUT CONTRAST  Technique:  Multidetector CT imaging of the head and cervical spine was performed following the standard protocol without intravenous contrast.  Multiplanar CT image reconstructions of the cervical spine were also generated.  Comparison:  Head CT 06/25/2012.  C-spine CT 04/10/2012.  CT HEAD  Findings: Mild cerebral and cerebellar atrophy.  Old infarction in the left cerebellar hemisphere is unchanged.  There are extensive patchy and confluent areas of decreased attenuation throughout the deep and periventricular white matter of the cerebral hemispheres bilaterally, compatible with advanced chronic microvascular ischemic changes. No acute displaced skull fractures are identified.  No acute intracranial abnormality.  Specifically, no evidence of acute post-traumatic intracranial hemorrhage, no definite regions of acute/subacute cerebral ischemia, no focal mass, mass effect, hydrocephalus or abnormal intra or extra-axial fluid collections.  The visualized paranasal sinuses and mastoids are well pneumatized.  IMPRESSION: 1.  No acute displaced skull fractures or findings to suggest significant acute traumatic injury to the head or brain. 2.  Cerebral and cerebellar atrophy with advanced chronic microvascular ischemic changes in the white matter and old left cerebellar infarct redemonstrated, as above.  CT CERVICAL SPINE  Findings: No acute displaced fractures of the  cervical spine are appreciated.  There is advanced multilevel degenerative disc disease and severe multilevel facet arthropathy, most severe at C5- C6 where there is 3 mm of anterolisthesis of C5 upon C6.  There is also a 3 mm of anterolisthesis of T1 upon T2.  Both of these levels appears similar to prior study 04/10/2012.  Alignment is otherwise anatomic.  Prevertebral soft tissues are normal.  Visualized portions of the upper thorax are unremarkable.  IMPRESSION: 1.  No evidence of significant acute traumatic injury to the cervical spine. 2.  Severe multilevel degenerative disc disease and cervical spondylosis redemonstrated, as above.   Original Report Authenticated By: Florencia Reasons, M.D.    Ct Cervical Spine Wo Contrast  07/28/2012  *RADIOLOGY REPORT*  Clinical Data:  History of trauma from a fall.  Head and neck pain. Dementia.  CT HEAD WITHOUT CONTRAST CT CERVICAL SPINE WITHOUT CONTRAST  Technique:  Multidetector CT imaging of the head and cervical spine was performed following the standard protocol without intravenous contrast.  Multiplanar CT image reconstructions of the cervical spine were also generated.  Comparison:  Head CT  06/25/2012.  C-spine CT 04/10/2012.  CT HEAD  Findings: Mild cerebral and cerebellar atrophy.  Old infarction in the left cerebellar hemisphere is unchanged.  There are extensive patchy and confluent areas of decreased attenuation throughout the deep and periventricular white matter of the cerebral hemispheres bilaterally, compatible with advanced chronic microvascular ischemic changes. No acute displaced skull fractures are identified.  No acute intracranial abnormality.  Specifically, no evidence of acute post-traumatic intracranial hemorrhage, no definite regions of acute/subacute cerebral ischemia, no focal mass, mass effect, hydrocephalus or abnormal intra or extra-axial fluid collections.  The visualized paranasal sinuses and mastoids are well pneumatized.  IMPRESSION:  1.  No acute displaced skull fractures or findings to suggest significant acute traumatic injury to the head or brain. 2.  Cerebral and cerebellar atrophy with advanced chronic microvascular ischemic changes in the white matter and old left cerebellar infarct redemonstrated, as above.  CT CERVICAL SPINE  Findings: No acute displaced fractures of the cervical spine are appreciated.  There is advanced multilevel degenerative disc disease and severe multilevel facet arthropathy, most severe at C5- C6 where there is 3 mm of anterolisthesis of C5 upon C6.  There is also a 3 mm of anterolisthesis of T1 upon T2.  Both of these levels appears similar to prior study 04/10/2012.  Alignment is otherwise anatomic.  Prevertebral soft tissues are normal.  Visualized portions of the upper thorax are unremarkable.  IMPRESSION: 1.  No evidence of significant acute traumatic injury to the cervical spine. 2.  Severe multilevel degenerative disc disease and cervical spondylosis redemonstrated, as above.   Original Report Authenticated By: Florencia Reasons, M.D.    Ct Pelvis Wo Contrast  07/28/2012  *RADIOLOGY REPORT*  Clinical Data:  Evaluate for right hip fracture.  The patient has a cardiac pacemaker.  CT PELVIS WITHOUT CONTRAST  Technique:  Multidetector CT imaging of the pelvis was performed following the standard protocol without intravenous contrast.  Comparison:  Pelvis bilateral hip radiographs 07/28/2012 and CT pelvis 02/01/2012.CT abdomen pelvis 07/18/2007.  Findings:  There is a remote healed fracture of the left inferior pubic ramus.  There is a small collar of osteophytes surrounding the right femoral head that is stable compared to the CT pelvis of 02/18/2012.  Appearance of the bony pelvis and right proximal femur is stable compared to recent pelvic CT.  No evidence of acute fracture is identified.  There are extensive facet joint degenerative changes of the lower lumbar spine.  Sacroiliac joints are aligned.  Stable  probable 5 mm bone island in the left acetabulum.  No evidence of hip joint effusion or focal soft tissue swelling adjacent either hip.  Atherosclerotic calcifications of the internal iliac vessels. Bilateral adnexal cysts are unchanged, measuring up to 5.7 x 3.4 cm on the right and 3.3 x 2.5 cm on the left.  The urinary bladder has a normal appearance.  Prominent amount of stool in the rectum. Colonic diverticulosis noted. Visualized bowel loops are normal in caliber.  Uterus is unremarkable for patient age.  Extensive atherosclerotic calcification of the proximal femoral vessels.  .  IMPRESSION: 1.  No evidence of acute bony injury of the right hip or bony pelvis.  A thin collar of osteophytes about the right femoral head may have simulated possible fracture on radiographs performed today. 2.  Bilateral adnexal (likely ovarian) cysts are again seen. Consider further evaluation with ultrasound.  These have increased in size since an abdomen pelvis CT of 07/18/2007. 3.  Remote healed left inferior  pubic ramus fracture.   Original Report Authenticated By: [facility], M.D.    Dg Knee Complete 4 Views Left  07/28/2012  *RADIOLOGY REPORT*  Clinical Data: Fall.  Pain.  LEFT KNEE - COMPLETE 4+ VIEW  Comparison: Right knee radiographs 07/29/2011  Findings: Marked osteopenia.  Surgical changes of total right knee arthroplasty.  No acute fracture is identified.  Prominent fabella noted.  No evidence of joint effusion or focal soft tissue swelling.  IMPRESSION:  1.  Marked osteopenia.  No acute bony abnormality identified. 2.  Total knee arthroplasty.  No hardware complication identified.   Original Report Authenticated By: [facility], M.D.     Review of Systems  : dementia  Cardiovascular: Positive for palpitations.       Positive CAD   Psychiatric/Behavioral:       Alzheimers stage 6 .    Blood pressure 164/103, pulse 61, temperature 97.3 F (36.3 C), temperature source Oral, resp. rate 12, height 5\' 6"   (1.676 m), weight 68.04 kg (150 lb), SpO2 100.00%. Physical Exam  Constitutional: She appears well-developed.  HENT:  Head: Normocephalic.  Eyes: Pupils are equal, round, and reactive to light.  Neck: Normal range of motion.  Cardiovascular:       irreg heart rate  Respiratory: Effort normal.  Musculoskeletal:       Right and left hip ROM 45 ER and 30 IR without pain . No hip flex contracture. bilat TKA incisions  Neurological:       confused    Assessment/Plan: Plain xrays and CT scan show spurring of right hip but no femoral neck fracture. Old pubic rami fracture.  Will be glad to see her if needed. No indication for further testing of her hips.    [facility] C 07/28/2012, 5:33 PM

## 2012-07-28 NOTE — ED Notes (Addendum)
Patient states she fell while trying to get up out of bed when she fell.  Patient denies LOC.  Patient states the base of the right side of her head hurts, right neck, sacrum, and bilateral hands/wrists

## 2012-07-28 NOTE — ED Notes (Signed)
Patient transported to CT 

## 2012-07-28 NOTE — Progress Notes (Signed)
[facility] History and Physical  Alison Dodson:[id number] DOB: 11/10/1928 DOA: 07/28/2012  Referring physician: Dr. Silverio Lay PCP: Gaspar Garbe, MD   Chief Complaint: Recent fall with suspected R femoral fracture  HPI: Alison Dodson is a 78 y.o. female  With advanced dementia that presents to the ED after a recent fall.  Grand daughter is in room and reports that patient was was at skilled nursing facility when this occurred.  Reportedly patient is wheel chair bound but tries to get up and go to the bathroom from time to time although she is not supposed to.  Reportedly patient had tried to get up and fell.  Per my initial discussion with the ER doctor patient had a fracture.  But after evaluation with CT scan patient was found to have bone spurs which were interpreted by the radiologist on x ray as a fracture.  Orthopaedic surgeon has evaluated patient and they have no further recommendations at this time.    Patient otherwise had urinalysis which showed cloudy urine with positive nitrite and moderate leukocytosis and was started on ciprofloxacin.  Patient has no complaints of dysuria at this juncture.  Review of Systems: Unable to properly obtain due to Advanced Dementia  Past Medical History  Diagnosis Date  . Anxiety   . Coronary artery disease   . Depression   . Hypertension   . Tachy-brady syndrome   . PAF (paroxysmal atrial fibrillation)   . HX: anticoagulation   . Glaucoma   . Dizziness   . SSS (sick sinus syndrome)   . GERD (gastroesophageal reflux disease)   . Fall   . Osteoarthritis   . Dementia    Past Surgical History  Procedure Date  . Insert / replace / remove pacemaker 01/24/2008    DDD PACEMAKER IMPLANT  . Cholecystectomy   . Gastric bypass   . Breast reduction surgery   . US echocardiography 08/10/2007    EF 60%  . US echocardiography 01/12/2007    EF 70%  . Cardiovascular stress test 02/15/2007  . Total knee arthroplasty     right  . Pacemaker  insertion    Social History:  reports that she quit smoking about 51 years ago. She does not have any smokeless tobacco history on file. She reports that she does not drink alcohol or use illicit drugs. SNF and is wheelchair bound  Allergies  Allergen Reactions  . Codeine Other (See Comments)    unknown  . Iodine Other (See Comments)    unknown    Family History  Problem Relation Age of Onset  . Kidney failure Mother   . Heart attack Father   . Heart attack Sister   . Heart attack Brother   history of diabetes, bipolar d/o  Prior to Admission medications   Medication Sig Start Date End Date Taking? Authorizing Provider  acetaminophen (TYLENOL) 500 MG tablet Take 500 mg by mouth 3 (three) times daily.    Yes Historical Provider, MD  alum & mag hydroxide-simeth (MI-ACID) 200-200-20 MG/5ML suspension Take 30 mLs by mouth as needed. Standing order for heartburn /indigestion.Not to exceed 4 doses in 24 hours.   Yes Historical Provider, MD  amiodarone (PACERONE) 200 MG tablet Take 200 mg by mouth daily.    Yes Historical Provider, MD  aspirin 325 MG tablet Take 325 mg by mouth daily.   Yes Historical Provider, MD  atenolol (TENORMIN) 50 MG tablet Take 50 mg by mouth daily.     Yes Historical  Provider, MD  azelastine (OPTIVAR) 0.05 % ophthalmic solution Place 1 drop into both eyes daily.    Yes Historical Provider, MD  buPROPion (WELLBUTRIN XL) 300 MG 24 hr tablet Take 300 mg by mouth daily.     Yes Historical Provider, MD  cholecalciferol (VITAMIN D) 1000 UNITS tablet Take 1,000 Units by mouth daily.   Yes Historical Provider, MD  docusate sodium (STOOL SOFTENER) 100 MG capsule Take 100 mg by mouth daily.    Yes Historical Provider, MD  donepezil (ARICEPT) 10 MG tablet Take 10 mg by mouth daily.    Yes Historical Provider, MD  guaiFENesin-codeine (IOPHEN C-NR) 100-10 MG/5ML syrup Take 10 mLs by mouth 4 (four) times daily as needed. Standing order for cough.   Yes Historical Provider, MD    latanoprost (XALATAN) 0.005 % ophthalmic solution Place 1 drop into both eyes daily.    Yes Historical Provider, MD  lisinopril (PRINIVIL,ZESTRIL) 10 MG tablet Take 10 mg by mouth daily.   Yes Historical Provider, MD  loperamide (IMODIUM A-D) 2 MG tablet Take 2 mg by mouth as needed. With each loose stool. Not to exceed 8 tablets in 24 hours.   Yes Historical Provider, MD  magnesium hydroxide (MILK OF MAGNESIA) 400 MG/5ML suspension Take 30 mLs by mouth at bedtime as needed. Standing order for constipation.   Yes Historical Provider, MD  omeprazole (PRILOSEC) 20 MG capsule Take 20 mg by mouth every morning.    Yes Historical Provider, MD  potassium chloride (K-DUR,KLOR-CON) 10 MEQ tablet Take 10 mEq by mouth every morning.    Yes Historical Provider, MD  QUEtiapine (SEROQUEL) 25 MG tablet Take 25 mg by mouth 2 (two) times daily.   Yes Historical Provider, MD   Physical Exam: Filed Vitals:   07/28/12 1105 07/28/12 1327 07/28/12 1545 07/28/12 1609  BP: 172/97 172/109 164/103   Pulse: 117 67 61   Temp: 97.4 F (36.3 C)   97.3 F (36.3 C)  TempSrc: Oral   Oral  Resp: 20 15 12    Height: 5\' 6"  (1.676 m)     Weight: 68.04 kg (150 lb)     SpO2: 94% 100% 100%      General:  Pt in NAD, Awake and Alert  Eyes: EOMI, no icterus  ENT: no masses on visual inspection, normal exterior appearance  Neck: no masses on visual inspection, no goiter  Cardiovascular: RRR, No MRG  Respiratory: Clear to auscultation, no wheezes  Abdomen: Soft, NT, ND  Skin: no new rashes or diaphoresis  Musculoskeletal: no clubbing or cyanosis  Psychiatric: difficult to assess due to advanced dementia  Neurologic: Patient moves all extremities equally  Labs on Admission:  Basic Metabolic Panel:  Lab 07/28/12 1610  NA 142  K 3.4*  CL 105  CO2 29  GLUCOSE 76  BUN 15  CREATININE 1.06  CALCIUM 9.5  MG --  PHOS --   Liver Function Tests:  Lab 07/28/12 1233  AST 17  ALT 9  ALKPHOS 75  BILITOT 0.5   PROT 5.8*  ALBUMIN 3.4*   No results found for this basename: LIPASE:5,AMYLASE:5 in the last 168 hours No results found for this basename: AMMONIA:5 in the last 168 hours CBC:  Lab 07/28/12 1233  WBC 5.7  NEUTROABS 3.7  HGB 13.5  HCT 40.3  MCV 92.2  PLT 218   Cardiac Enzymes:  Lab 07/28/12 1233  CKTOTAL 40  CKMB 2.3  CKMBINDEX --  TROPONINI <0.30    BNP (last 3 results)  No results found for this basename: PROBNP:3 in the last 8760 hours CBG: No results found for this basename: GLUCAP:5 in the last 168 hours  Radiological Exams on Admission: Dg Chest 1 View  07/28/2012  *RADIOLOGY REPORT*  Clinical Data: Fall.  Altered mental status.  CHEST - 1 VIEW  Comparison: Chest radiograph 06/25/2012  Findings: Left chest wall dual lead pacer with leads projecting over the right atrium and right ventricle.  Stable cardiomegaly. Small hiatal hernia with an air-fluid level.  A 10 mm radiopaque density projecting over the hiatal hernia may be a pill fragment within the herniated portion of the stomach.  This density was not present on multiple recent prior chest radiographs.  Pulmonary vascularity is normal and the lung fields are clear. There is no visible pleural effusion or pneumothorax. Cholecystectomy clips.  Visualized bowel gas pattern normal. Degenerative changes of the left glenohumeral joint.  IMPRESSION:  1.  Stable cardiomegaly with dual lead pacemaker.  No acute findings in the chest. 2.  Small hiatal hernia.  Question a radiodense pill fragment within the herniated portion of the stomach.   Original Report Authenticated By: Britta Mccreedy, M.D.    Dg Hip Bilateral W/pelvis  07/28/2012  *RADIOLOGY REPORT*  Clinical Data: Post fall, now with bilateral hip pain  BILATERAL HIP WITH PELVIS - 4+ VIEW  Comparison: None.  Findings:  There is apparent shortening of the right femoral neck which is worrisome for a minimally displaced right subcapsular fracture. No definite fracture of the left  hip.  Old left-sided superior and inferior pubic rami fractures.  Mild asymmetric degenerative change of the right hip with joint space loss and axial migration.  Lumbar spine degenerative change.  Visualized bowel gas pattern is normal. Vascular calcifications.  IMPRESSION: 1.  Findings worrisome for a minimally impacted right subcapsular femoral neck fracture.  Further evaluation with a hip CT may be performed as clinically indicated.  2.  Old left sided superior and inferior pubic rami fractures   Original Report Authenticated By: Waynard Reeds, M.D.    Ct Head Wo Contrast  07/28/2012  *RADIOLOGY REPORT*  Clinical Data:  History of trauma from a fall.  Head and neck pain. Dementia.  CT HEAD WITHOUT CONTRAST CT CERVICAL SPINE WITHOUT CONTRAST  Technique:  Multidetector CT imaging of the head and cervical spine was performed following the standard protocol without intravenous contrast.  Multiplanar CT image reconstructions of the cervical spine were also generated.  Comparison:  Head CT 06/25/2012.  C-spine CT 04/10/2012.  CT HEAD  Findings: Mild cerebral and cerebellar atrophy.  Old infarction in the left cerebellar hemisphere is unchanged.  There are extensive patchy and confluent areas of decreased attenuation throughout the deep and periventricular white matter of the cerebral hemispheres bilaterally, compatible with advanced chronic microvascular ischemic changes. No acute displaced skull fractures are identified.  No acute intracranial abnormality.  Specifically, no evidence of acute post-traumatic intracranial hemorrhage, no definite regions of acute/subacute cerebral ischemia, no focal mass, mass effect, hydrocephalus or abnormal intra or extra-axial fluid collections.  The visualized paranasal sinuses and mastoids are well pneumatized.  IMPRESSION: 1.  No acute displaced skull fractures or findings to suggest significant acute traumatic injury to the head or brain. 2.  Cerebral and cerebellar atrophy  with advanced chronic microvascular ischemic changes in the white matter and old left cerebellar infarct redemonstrated, as above.  CT CERVICAL SPINE  Findings: No acute displaced fractures of the cervical spine are appreciated.  There is  advanced multilevel degenerative disc disease and severe multilevel facet arthropathy, most severe at C5- C6 where there is 3 mm of anterolisthesis of C5 upon C6.  There is also a 3 mm of anterolisthesis of T1 upon T2.  Both of these levels appears similar to prior study 04/10/2012.  Alignment is otherwise anatomic.  Prevertebral soft tissues are normal.  Visualized portions of the upper thorax are unremarkable.  IMPRESSION: 1.  No evidence of significant acute traumatic injury to the cervical spine. 2.  Severe multilevel degenerative disc disease and cervical spondylosis redemonstrated, as above.   Original Report Authenticated By: Florencia Reasons, M.D.    Ct Cervical Spine Wo Contrast  07/28/2012  *RADIOLOGY REPORT*  Clinical Data:  History of trauma from a fall.  Head and neck pain. Dementia.  CT HEAD WITHOUT CONTRAST CT CERVICAL SPINE WITHOUT CONTRAST  Technique:  Multidetector CT imaging of the head and cervical spine was performed following the standard protocol without intravenous contrast.  Multiplanar CT image reconstructions of the cervical spine were also generated.  Comparison:  Head CT 06/25/2012.  C-spine CT 04/10/2012.  CT HEAD  Findings: Mild cerebral and cerebellar atrophy.  Old infarction in the left cerebellar hemisphere is unchanged.  There are extensive patchy and confluent areas of decreased attenuation throughout the deep and periventricular white matter of the cerebral hemispheres bilaterally, compatible with advanced chronic microvascular ischemic changes. No acute displaced skull fractures are identified.  No acute intracranial abnormality.  Specifically, no evidence of acute post-traumatic intracranial hemorrhage, no definite regions of  acute/subacute cerebral ischemia, no focal mass, mass effect, hydrocephalus or abnormal intra or extra-axial fluid collections.  The visualized paranasal sinuses and mastoids are well pneumatized.  IMPRESSION: 1.  No acute displaced skull fractures or findings to suggest significant acute traumatic injury to the head or brain. 2.  Cerebral and cerebellar atrophy with advanced chronic microvascular ischemic changes in the white matter and old left cerebellar infarct redemonstrated, as above.  CT CERVICAL SPINE  Findings: No acute displaced fractures of the cervical spine are appreciated.  There is advanced multilevel degenerative disc disease and severe multilevel facet arthropathy, most severe at C5- C6 where there is 3 mm of anterolisthesis of C5 upon C6.  There is also a 3 mm of anterolisthesis of T1 upon T2.  Both of these levels appears similar to prior study 04/10/2012.  Alignment is otherwise anatomic.  Prevertebral soft tissues are normal.  Visualized portions of the upper thorax are unremarkable.  IMPRESSION: 1.  No evidence of significant acute traumatic injury to the cervical spine. 2.  Severe multilevel degenerative disc disease and cervical spondylosis redemonstrated, as above.   Original Report Authenticated By: Florencia Reasons, M.D.    Ct Pelvis Wo Contrast  07/28/2012  *RADIOLOGY REPORT*  Clinical Data:  Evaluate for right hip fracture.  The patient has a cardiac pacemaker.  CT PELVIS WITHOUT CONTRAST  Technique:  Multidetector CT imaging of the pelvis was performed following the standard protocol without intravenous contrast.  Comparison:  Pelvis bilateral hip radiographs 07/28/2012 and CT pelvis 02/01/2012.CT abdomen pelvis 07/18/2007.  Findings:  There is a remote healed fracture of the left inferior pubic ramus.  There is a small collar of osteophytes surrounding the right femoral head that is stable compared to the CT pelvis of 02/18/2012.  Appearance of the bony pelvis and right proximal  femur is stable compared to recent pelvic CT.  No evidence of acute fracture is identified.  There are extensive facet  joint degenerative changes of the lower lumbar spine.  Sacroiliac joints are aligned.  Stable probable 5 mm bone island in the left acetabulum.  No evidence of hip joint effusion or focal soft tissue swelling adjacent either hip.  Atherosclerotic calcifications of the internal iliac vessels. Bilateral adnexal cysts are unchanged, measuring up to 5.7 x 3.4 cm on the right and 3.3 x 2.5 cm on the left.  The urinary bladder has a normal appearance.  Prominent amount of stool in the rectum. Colonic diverticulosis noted. Visualized bowel loops are normal in caliber.  Uterus is unremarkable for patient age.  Extensive atherosclerotic calcification of the proximal femoral vessels.  .  IMPRESSION: 1.  No evidence of acute bony injury of the right hip or bony pelvis.  A thin collar of osteophytes about the right femoral head may have simulated possible fracture on radiographs performed today. 2.  Bilateral adnexal (likely ovarian) cysts are again seen. Consider further evaluation with ultrasound.  These have increased in size since an abdomen pelvis CT of 07/18/2007. 3.  Remote healed left inferior pubic ramus fracture.   Original Report Authenticated By: Britta Mccreedy, M.D.    Dg Knee Complete 4 Views Left  07/28/2012  *RADIOLOGY REPORT*  Clinical Data: Fall.  Pain.  LEFT KNEE - COMPLETE 4+ VIEW  Comparison: Right knee radiographs 07/29/2011  Findings: Marked osteopenia.  Surgical changes of total right knee arthroplasty.  No acute fracture is identified.  Prominent fabella noted.  No evidence of joint effusion or focal soft tissue swelling.  IMPRESSION:  1.  Marked osteopenia.  No acute bony abnormality identified. 2.  Total knee arthroplasty.  No hardware complication identified.   Original Report Authenticated By: Britta Mccreedy, M.D.     EKG: Independently reviewed. Atrial fibrillation rate  controlled.  Assessment/Plan Active Problems: Alzheimer's dementia Atrial fibrillation Fall Essential hypertension UTI   1. Fall - Reportedly patient had fracture but after CT scan patient did not have a fracture and had bone spurs.  Orthopaedic surgeon has evaluated patient and found no surgery.  Have not recommended further testing of her hips. - Will plan on treating pain with tylenol given that patient has history of nausea to opiods  2. Alzheimers dementia - Stable and will plan on continuing home regimen  3. UTI - Will plan on continuing cipro at this point - Treat as uncomplicated with today being day one of antibiotics.  4. Essential hypertension - Continue home regimen - likely higher blood pressure readings given that patient has had discomfort recently.  5. DVT prophylaxis - ted hose.  6. Hypokalemia - Continue home regimen of oral replacement.   Code Status: full code Family Communication: spoke with grand daughter Disposition Plan: D/C to SNF likely 9/8  Time spent: > 50 minutes  Penny Pia [facility] Pager [phone]  If 7PM-7AM, please contact night-coverage [email or web address] Password [facility] 07/28/2012, 6:02 PM

## 2012-07-28 NOTE — ED Notes (Addendum)
NWG:[id number] date:07/28/12<BR>Expected time:10:43 AM<BR>Means of arrival:Ambulance<BR>Comments:<BR>Fall

## 2012-07-29 LAB — CBC
MCH: 31.3 pg (ref 26.0–34.0)
MCV: 91.5 fL (ref 78.0–100.0)
Platelets: 217 10*3/uL (ref 150–400)
RDW: 14.1 % (ref 11.5–15.5)

## 2012-07-29 LAB — BASIC METABOLIC PANEL
CO2: 28 mEq/L (ref 19–32)
Calcium: 9.7 mg/dL (ref 8.4–10.5)
Creatinine, Ser: 0.93 mg/dL (ref 0.50–1.10)
Glucose, Bld: 88 mg/dL (ref 70–99)

## 2012-07-29 MED ORDER — HYDRALAZINE HCL 20 MG/ML IJ SOLN
10.0000 mg | Freq: Once | INTRAMUSCULAR | Status: AC
  Administered 2012-07-29: 10 mg via INTRAVENOUS
  Filled 2012-07-29: qty 1

## 2012-07-29 MED ORDER — CEPHALEXIN 500 MG PO CAPS: 500.0000 mg | ORAL_CAPSULE | Freq: Two times a day (BID) | ORAL | Status: AC

## 2012-07-29 MED ORDER — ACETAMINOPHEN 325 MG PO TABS: 650.0000 mg | ORAL_TABLET | ORAL | Status: DC | PRN

## 2012-07-29 MED ORDER — ACETAMINOPHEN 325 MG PO TABS: 650.0000 mg | ORAL_TABLET | Freq: Three times a day (TID) | ORAL | Status: DC

## 2012-07-29 NOTE — Discharge Summary (Signed)
Physician Discharge Summary  [name] Dodson:[id number] DOB: 02/14/1928 DOA: 07/28/2012  PCP: Gaspar Garbe, MD  Admit date: 07/28/2012 Discharge date: 07/29/2012  Recommendations for Outpatient Follow-up:  1. Please follow up with patient in 1-2 weeks.  She will be prescribed antibiotic therapy for her UTI. I have provided a prescription for keflex for 6 days to complete a 7 day total. 2. Also please evaluate and adjust pain medication as needed.   Discharge Diagnoses:  Active Problems:  Essential hypertension, benign  Atrial fibrillation  Alzheimer's dementia  UTI (lower urinary tract infection)   Discharge Condition: Stable   Diet recommendation: Low sodium diet  Filed Weights   07/28/12 1105  Weight: 68.04 kg (150 lb)    History of present illness:  From original HPI  Alison Dodson is a 78 y.o. female  With advanced dementia that presents to the ED after a recent fall. Grand daughter is in room and reports that patient was was at skilled nursing facility when this occurred. Reportedly patient is wheel chair bound but tries to get up and go to the bathroom from time to time although she is not supposed to. Reportedly patient had tried to get up and fell. Per my initial discussion with the ER doctor patient had a fracture. But after evaluation with CT scan patient was found to have bone spurs which were interpreted by the radiologist on x ray as a fracture. Orthopaedic surgeon has evaluated patient and they have no further recommendations at this time.  Patient otherwise had urinalysis which showed cloudy urine with positive nitrite and moderate leukocytosis and was started on ciprofloxacin. Patient has no complaints of dysuria at this juncture.  Hospital Course:  1. Fall - Reportedly patient had fracture but after CT scan patient did not have a fracture and had bone spurs. Orthopaedic surgeon has evaluated patient and found no surgery. They did not recommend further testing  of her hips.  - Will plan on treating pain with tylenol given that patient has history of nausea to opiods  - Patient is wheelchair bound and as such family did not want physical therapy evaluation and recommendations.  2. Alzheimers dementia  - Stable and will plan on continuing home regimen   3. UTI  - Will plan on treating with Keflex for 6 more days to continue a 7 day total course.  Patient had cipro and rocephin initially. - Treat as uncomplicated with today being day one of antibiotics. - Patient may consider taking cranberry tabs for prevention   4. Essential hypertension  - Continue home regimen  - likely higher blood pressure readings given that patient has had discomfort recently. Will increase tylenol dose  5. DVT prophylaxis  - ted hose.   6. Hypokalemia  - Continue home regimen of oral replacement.  Procedures:  CT of pelvis w/o contrast  CT cervical spine w/o contrast  X ray of knee (left)  X ray of hip  Chest x ray  Consultations:  Orthopaedic surgery  Discharge Exam: Filed Vitals:   07/28/12 1645 07/28/12 2106 07/29/12 0613 07/29/12 1121  BP: 178/91 171/90 185/114 163/99  Pulse: 66 65 66 74  Temp: 97.8 F (36.6 C) 97.7 F (36.5 C) 97.6 F (36.4 C)   TempSrc: Oral Oral Oral   Resp: 20 20 20    Height: 5\' 6"  (1.676 m)     Weight:      SpO2: 95% 97% 98%     General: Pt in NAD, Alert and Awake  Cardiovascular: RRR, No MRG Respiratory: No increased work of breathing, no audible wheezes  Discharge Instructions  Discharge Orders    Future Orders Please Complete By Expires   Diet - low sodium heart healthy      Increase activity slowly      Discharge instructions      Comments:   Please follow up with PCP in 1-2 weeks or sooner should you have any new concerns.   Call MD for:  temperature >100.4      Call MD for:  redness, tenderness, or signs of infection (pain, swelling, redness, odor or green/yellow discharge around incision site)       Call MD for:  persistant nausea and vomiting        Medication List  As of 07/29/2012 12:01 PM   TAKE these medications         acetaminophen 325 MG tablet   Commonly known as: TYLENOL   Take 2 tablets (650 mg total) by mouth every 4 (four) hours as needed for pain.      amiodarone 200 MG tablet   Commonly known as: PACERONE   Take 200 mg by mouth daily.      aspirin 325 MG tablet   Take 325 mg by mouth daily.      atenolol 50 MG tablet   Commonly known as: TENORMIN   Take 50 mg by mouth daily.      azelastine 0.05 % ophthalmic solution   Commonly known as: OPTIVAR   Place 1 drop into both eyes daily.      buPROPion 300 MG 24 hr tablet   Commonly known as: WELLBUTRIN XL   Take 300 mg by mouth daily.      cephALEXin 500 MG capsule   Commonly known as: KEFLEX   Take 1 capsule (500 mg total) by mouth 2 (two) times daily.      cholecalciferol 1000 UNITS tablet   Commonly known as: VITAMIN D   Take 1,000 Units by mouth daily.      donepezil 10 MG tablet   Commonly known as: ARICEPT   Take 10 mg by mouth daily.      IOPHEN C-NR 100-10 MG/5ML syrup   Generic drug: guaiFENesin-codeine   Take 10 mLs by mouth 4 (four) times daily as needed. Standing order for cough.      latanoprost 0.005 % ophthalmic solution   Commonly known as: XALATAN   Place 1 drop into both eyes daily.      lisinopril 10 MG tablet   Commonly known as: PRINIVIL,ZESTRIL   Take 10 mg by mouth daily.      loperamide 2 MG tablet   Commonly known as: IMODIUM A-D   Take 2 mg by mouth as needed. With each loose stool. Not to exceed 8 tablets in 24 hours.      magnesium hydroxide 400 MG/5ML suspension   Commonly known as: MILK OF MAGNESIA   Take 30 mLs by mouth at bedtime as needed. Standing order for constipation.      MI-ACID 200-200-20 MG/5ML suspension   Generic drug: alum & mag hydroxide-simeth   Take 30 mLs by mouth as needed. Standing order for heartburn /indigestion.Not to exceed 4 doses in 24  hours.      omeprazole 20 MG capsule   Commonly known as: PRILOSEC   Take 20 mg by mouth every morning.      potassium chloride 10 MEQ tablet   Commonly known as: K-DUR,KLOR-CON   Take  10 mEq by mouth every morning.      QUEtiapine 25 MG tablet   Commonly known as: SEROQUEL   Take 25 mg by mouth 2 (two) times daily.      STOOL SOFTENER 100 MG capsule   Generic drug: docusate sodium   Take 100 mg by mouth daily.              The results of significant diagnostics from this hospitalization (including imaging, microbiology, ancillary and laboratory) are listed below for reference.    Significant Diagnostic Studies: Dg Chest 1 View  07/28/2012  *RADIOLOGY REPORT*  Clinical Data: Fall.  Altered mental status.  CHEST - 1 VIEW  Comparison: Chest radiograph 06/25/2012  Findings: Left chest wall dual lead pacer with leads projecting over the right atrium and right ventricle.  Stable cardiomegaly. Small hiatal hernia with an air-fluid level.  A 10 mm radiopaque density projecting over the hiatal hernia may be a pill fragment within the herniated portion of the stomach.  This density was not present on multiple recent prior chest radiographs.  Pulmonary vascularity is normal and the lung fields are clear. There is no visible pleural effusion or pneumothorax. Cholecystectomy clips.  Visualized bowel gas pattern normal. Degenerative changes of the left glenohumeral joint.  IMPRESSION:  1.  Stable cardiomegaly with dual lead pacemaker.  No acute findings in the chest. 2.  Small hiatal hernia.  Question a radiodense pill fragment within the herniated portion of the stomach.   Original Report Authenticated By: Britta Mccreedy, M.D.    Dg Hip Bilateral W/pelvis  07/28/2012  *RADIOLOGY REPORT*  Clinical Data: Post fall, now with bilateral hip pain  BILATERAL HIP WITH PELVIS - 4+ VIEW  Comparison: None.  Findings:  There is apparent shortening of the right femoral neck which is worrisome for a minimally  displaced right subcapsular fracture. No definite fracture of the left hip.  Old left-sided superior and inferior pubic rami fractures.  Mild asymmetric degenerative change of the right hip with joint space loss and axial migration.  Lumbar spine degenerative change.  Visualized bowel gas pattern is normal. Vascular calcifications.  IMPRESSION: 1.  Findings worrisome for a minimally impacted right subcapsular femoral neck fracture.  Further evaluation with a hip CT may be performed as clinically indicated.  2.  Old left sided superior and inferior pubic rami fractures   Original Report Authenticated By: Waynard Reeds, M.D.    Ct Head Wo Contrast  07/28/2012  *RADIOLOGY REPORT*  Clinical Data:  History of trauma from a fall.  Head and neck pain. Dementia.  CT HEAD WITHOUT CONTRAST CT CERVICAL SPINE WITHOUT CONTRAST  Technique:  Multidetector CT imaging of the head and cervical spine was performed following the standard protocol without intravenous contrast.  Multiplanar CT image reconstructions of the cervical spine were also generated.  Comparison:  Head CT 06/25/2012.  C-spine CT 04/10/2012.  CT HEAD  Findings: Mild cerebral and cerebellar atrophy.  Old infarction in the left cerebellar hemisphere is unchanged.  There are extensive patchy and confluent areas of decreased attenuation throughout the deep and periventricular white matter of the cerebral hemispheres bilaterally, compatible with advanced chronic microvascular ischemic changes. No acute displaced skull fractures are identified.  No acute intracranial abnormality.  Specifically, no evidence of acute post-traumatic intracranial hemorrhage, no definite regions of acute/subacute cerebral ischemia, no focal mass, mass effect, hydrocephalus or abnormal intra or extra-axial fluid collections.  The visualized paranasal sinuses and mastoids are well pneumatized.  IMPRESSION:  1.  No acute displaced skull fractures or findings to suggest significant acute  traumatic injury to the head or brain. 2.  Cerebral and cerebellar atrophy with advanced chronic microvascular ischemic changes in the white matter and old left cerebellar infarct redemonstrated, as above.  CT CERVICAL SPINE  Findings: No acute displaced fractures of the cervical spine are appreciated.  There is advanced multilevel degenerative disc disease and severe multilevel facet arthropathy, most severe at C5- C6 where there is 3 mm of anterolisthesis of C5 upon C6.  There is also a 3 mm of anterolisthesis of T1 upon T2.  Both of these levels appears similar to prior study 04/10/2012.  Alignment is otherwise anatomic.  Prevertebral soft tissues are normal.  Visualized portions of the upper thorax are unremarkable.  IMPRESSION: 1.  No evidence of significant acute traumatic injury to the cervical spine. 2.  Severe multilevel degenerative disc disease and cervical spondylosis redemonstrated, as above.   Original Report Authenticated By: Florencia Reasons, M.D.    Ct Cervical Spine Wo Contrast  07/28/2012  *RADIOLOGY REPORT*  Clinical Data:  History of trauma from a fall.  Head and neck pain. Dementia.  CT HEAD WITHOUT CONTRAST CT CERVICAL SPINE WITHOUT CONTRAST  Technique:  Multidetector CT imaging of the head and cervical spine was performed following the standard protocol without intravenous contrast.  Multiplanar CT image reconstructions of the cervical spine were also generated.  Comparison:  Head CT 06/25/2012.  C-spine CT 04/10/2012.  CT HEAD  Findings: Mild cerebral and cerebellar atrophy.  Old infarction in the left cerebellar hemisphere is unchanged.  There are extensive patchy and confluent areas of decreased attenuation throughout the deep and periventricular white matter of the cerebral hemispheres bilaterally, compatible with advanced chronic microvascular ischemic changes. No acute displaced skull fractures are identified.  No acute intracranial abnormality.  Specifically, no evidence of acute  post-traumatic intracranial hemorrhage, no definite regions of acute/subacute cerebral ischemia, no focal mass, mass effect, hydrocephalus or abnormal intra or extra-axial fluid collections.  The visualized paranasal sinuses and mastoids are well pneumatized.  IMPRESSION: 1.  No acute displaced skull fractures or findings to suggest significant acute traumatic injury to the head or brain. 2.  Cerebral and cerebellar atrophy with advanced chronic microvascular ischemic changes in the white matter and old left cerebellar infarct redemonstrated, as above.  CT CERVICAL SPINE  Findings: No acute displaced fractures of the cervical spine are appreciated.  There is advanced multilevel degenerative disc disease and severe multilevel facet arthropathy, most severe at C5- C6 where there is 3 mm of anterolisthesis of C5 upon C6.  There is also a 3 mm of anterolisthesis of T1 upon T2.  Both of these levels appears similar to prior study 04/10/2012.  Alignment is otherwise anatomic.  Prevertebral soft tissues are normal.  Visualized portions of the upper thorax are unremarkable.  IMPRESSION: 1.  No evidence of significant acute traumatic injury to the cervical spine. 2.  Severe multilevel degenerative disc disease and cervical spondylosis redemonstrated, as above.   Original Report Authenticated By: Florencia Reasons, M.D.    Ct Pelvis Wo Contrast  07/28/2012  *RADIOLOGY REPORT*  Clinical Data:  Evaluate for right hip fracture.  The patient has a cardiac pacemaker.  CT PELVIS WITHOUT CONTRAST  Technique:  Multidetector CT imaging of the pelvis was performed following the standard protocol without intravenous contrast.  Comparison:  Pelvis bilateral hip radiographs 07/28/2012 and CT pelvis 02/01/2012.CT abdomen pelvis 07/18/2007.  Findings:  There is  a remote healed fracture of the left inferior pubic ramus.  There is a small collar of osteophytes surrounding the right femoral head that is stable compared to the CT pelvis of  02/18/2012.  Appearance of the bony pelvis and right proximal femur is stable compared to recent pelvic CT.  No evidence of acute fracture is identified.  There are extensive facet joint degenerative changes of the lower lumbar spine.  Sacroiliac joints are aligned.  Stable probable 5 mm bone island in the left acetabulum.  No evidence of hip joint effusion or focal soft tissue swelling adjacent either hip.  Atherosclerotic calcifications of the internal iliac vessels. Bilateral adnexal cysts are unchanged, measuring up to 5.7 x 3.4 cm on the right and 3.3 x 2.5 cm on the left.  The urinary bladder has a normal appearance.  Prominent amount of stool in the rectum. Colonic diverticulosis noted. Visualized bowel loops are normal in caliber.  Uterus is unremarkable for patient age.  Extensive atherosclerotic calcification of the proximal femoral vessels.  .  IMPRESSION: 1.  No evidence of acute bony injury of the right hip or bony pelvis.  A thin collar of osteophytes about the right femoral head may have simulated possible fracture on radiographs performed today. 2.  Bilateral adnexal (likely ovarian) cysts are again seen. Consider further evaluation with ultrasound.  These have increased in size since an abdomen pelvis CT of 07/18/2007. 3.  Remote healed left inferior pubic ramus fracture.   Original Report Authenticated By: Britta Mccreedy, M.D.    Dg Knee Complete 4 Views Left  07/28/2012  *RADIOLOGY REPORT*  Clinical Data: Fall.  Pain.  LEFT KNEE - COMPLETE 4+ VIEW  Comparison: Right knee radiographs 07/29/2011  Findings: Marked osteopenia.  Surgical changes of total right knee arthroplasty.  No acute fracture is identified.  Prominent fabella noted.  No evidence of joint effusion or focal soft tissue swelling.  IMPRESSION:  1.  Marked osteopenia.  No acute bony abnormality identified. 2.  Total knee arthroplasty.  No hardware complication identified.   Original Report Authenticated By: Britta Mccreedy, M.D.      Microbiology: Recent Results (from the past 240 hour(s))  MRSA PCR SCREENING     Status: Normal   Collection Time   07/28/12  7:25 PM      Component Value Range Status Comment   MRSA by PCR NEGATIVE  NEGATIVE Final      Labs: Basic Metabolic Panel:  Lab 07/29/12 2956 07/28/12 1233  NA 142 142  K 3.4* 3.4*  CL 106 105  CO2 28 29  GLUCOSE 88 76  BUN 15 15  CREATININE 0.93 1.06  CALCIUM 9.7 9.5  MG -- --  PHOS -- --   Liver Function Tests:  Lab 07/28/12 1233  AST 17  ALT 9  ALKPHOS 75  BILITOT 0.5  PROT 5.8*  ALBUMIN 3.4*   No results found for this basename: LIPASE:5,AMYLASE:5 in the last 168 hours No results found for this basename: AMMONIA:5 in the last 168 hours CBC:  Lab 07/29/12 0525 07/28/12 1233  WBC 5.2 5.7  NEUTROABS -- 3.7  HGB 14.0 13.5  HCT 40.9 40.3  MCV 91.5 92.2  PLT 217 218   Cardiac Enzymes:  Lab 07/28/12 1233  CKTOTAL 40  CKMB 2.3  CKMBINDEX --  TROPONINI <0.30   BNP: BNP (last 3 results) No results found for this basename: PROBNP:3 in the last 8760 hours CBG: No results found for this basename: GLUCAP:5 in the last  168 hours  Time coordinating discharge: > 30 minutes  Signed:  Penny Pia  [facility] 07/29/2012, 12:01 PM

## 2012-07-29 NOTE — Progress Notes (Signed)
Clinical Social Work Department BRIEF PSYCHOSOCIAL ASSESSMENT 07/29/2012  Patient:  Rolston,Tuyen S     Account Number:  [id number]     Admit date:  07/28/2012  Clinical Social Worker:  Leron Croak, CLINICAL SOCIAL WORKER  Date/Time:  07/29/2012 02:07 PM  Referred by:  Physician  Date Referred:  07/28/2012 Referred for  ALF Placement   Other Referral:   Interview type:  Family Other interview type:    PSYCHOSOCIAL DATA Living Status:  FACILITY Admitted from facility:  [facility] Level of care:   Primary support name:  Liliane Channel Primary support relationship to patient:  CHILD, ADULT Degree of support available:    CURRENT CONCERNS Current Concerns  Post-Acute Placement   Other Concerns:    SOCIAL WORK ASSESSMENT / PLAN CSW met with the Pt and grand-daughter  Jasmine December) concerning Pt d/c back to facility. Family confirmed Pt residence. CSW contacted facility.   Assessment/plan status:  Information/Referral to [facility] Other assessment/ plan:   Information/referral to community resources:   No additional information at this time.    PATIENT'S/FAMILY'S RESPONSE TO PLAN OF CARE: Sharalyn Ink was agreeable to Pt return and thanked CSW for assistance.       Leron Croak, LCSWA [employer] [phone]

## 2012-07-29 NOTE — Progress Notes (Signed)
CSW was able to speak with the facility.   Pt is clear to return.  CSW working on d/c planning.   Leron Croak, LCSWA [employer] [phone]

## 2012-07-29 NOTE — Progress Notes (Signed)
Patient discharged back to [facility] living. Alert, denies any distress and no distress noted. Granddaughter at the bedside during transportation back to the facility via ambulance. Skin intact, no open wound but redness on the sacrum. Discharge packet prepared by CSW and given to EMT transporter. And report already given to [facility] in the facility.

## 2012-07-29 NOTE — Progress Notes (Signed)
Pt is from [facility] and will be d/c today back to the facility.   Facility was unavailable to take the call and CSW left a message for a return call to inform facility that Pt will be returning back to the facility today.   CSW waiting for further d/c planning.  Leron Croak, LCSWA [employer] [phone]

## 2012-08-10 NOTE — H&P (Signed)
[facility] History and Physical   Alison Dodson WUJ:[id number] DOB: 09-25-1928 DOA: 07/28/2012   Referring physician: Dr. Silverio Lay PCP: Gaspar Garbe, MD    Chief Complaint: Recent fall with suspected R femoral fracture   HPI: Alison Dodson is a 78 y.o. female   With advanced dementia that presents to the ED after a recent fall.  Grand daughter is in room and reports that patient was was at skilled nursing facility when this occurred.  Reportedly patient is wheel chair bound but tries to get up and go to the bathroom from time to time although she is not supposed to.  Reportedly patient had tried to get up and fell.  Per my initial discussion with the ER doctor patient had a fracture.  But after evaluation with CT scan patient was found to have bone spurs which were interpreted by the radiologist on x ray as a fracture.  Orthopaedic surgeon has evaluated patient and they have no further recommendations at this time.     Patient otherwise had urinalysis which showed cloudy urine with positive nitrite and moderate leukocytosis and was started on ciprofloxacin.  Patient has no complaints of dysuria at this juncture.   Review of Systems: Unable to properly obtain due to Advanced Dementia    Past Medical History   Diagnosis  Date   .  Anxiety     .  Coronary artery disease     .  Depression     .  Hypertension     .  Tachy-brady syndrome     .  PAF (paroxysmal atrial fibrillation)     .  HX: anticoagulation     .  Glaucoma     .  Dizziness     .  SSS (sick sinus syndrome)     .  GERD (gastroesophageal reflux disease)     .  Fall     .  Osteoarthritis     .  Dementia      Past Surgical History   Procedure  Date   .  Insert / replace / remove pacemaker  01/24/2008       DDD PACEMAKER IMPLANT   .  Cholecystectomy     .  Gastric bypass     .  Breast reduction surgery     .  US echocardiography  08/10/2007       EF 60%   .  US echocardiography  01/12/2007       EF 70%   .   Cardiovascular stress test  02/15/2007   .  Total knee arthroplasty         right   .  Pacemaker insertion      Social History: reports that she quit smoking about 51 years ago. She does not have any smokeless tobacco history on file. She reports that she does not drink alcohol or use illicit drugs. SNF and is wheelchair bound    Allergies   Allergen  Reactions   .  Codeine  Other (See Comments)       unknown   .  Iodine  Other (See Comments)       unknown       Family History   Problem  Relation  Age of Onset   .  Kidney failure  Mother     .  Heart attack  Father     .  Heart attack  Sister     .  Heart attack  Brother  history of diabetes, bipolar d/o    Prior to Admission medications    Medication  Sig  Start Date  End Date  Taking?  Authorizing Provider   acetaminophen (TYLENOL) 500 MG tablet  Take 500 mg by mouth 3 (three) times daily.       Yes  Historical Provider, MD   alum & mag hydroxide-simeth (MI-ACID) 200-200-20 MG/5ML suspension  Take 30 mLs by mouth as needed. Standing order for heartburn /indigestion.Not to exceed 4 doses in 24 hours.      Yes  Historical Provider, MD   amiodarone (PACERONE) 200 MG tablet  Take 200 mg by mouth daily.       Yes  Historical Provider, MD   aspirin 325 MG tablet  Take 325 mg by mouth daily.      Yes  Historical Provider, MD   atenolol (TENORMIN) 50 MG tablet  Take 50 mg by mouth daily.        Yes  Historical Provider, MD   azelastine (OPTIVAR) 0.05 % ophthalmic solution  Place 1 drop into both eyes daily.       Yes  Historical Provider, MD   buPROPion (WELLBUTRIN XL) 300 MG 24 hr tablet  Take 300 mg by mouth daily.        Yes  Historical Provider, MD   cholecalciferol (VITAMIN D) 1000 UNITS tablet  Take 1,000 Units by mouth daily.      Yes  Historical Provider, MD   docusate sodium (STOOL SOFTENER) 100 MG capsule  Take 100 mg by mouth daily.       Yes  Historical Provider, MD   donepezil (ARICEPT) 10 MG tablet  Take 10 mg by mouth  daily.       Yes  Historical Provider, MD   guaiFENesin-codeine (IOPHEN C-NR) 100-10 MG/5ML syrup  Take 10 mLs by mouth 4 (four) times daily as needed. Standing order for cough.      Yes  Historical Provider, MD   latanoprost (XALATAN) 0.005 % ophthalmic solution  Place 1 drop into both eyes daily.       Yes  Historical Provider, MD   lisinopril (PRINIVIL,ZESTRIL) 10 MG tablet  Take 10 mg by mouth daily.      Yes  Historical Provider, MD   loperamide (IMODIUM A-D) 2 MG tablet  Take 2 mg by mouth as needed. With each loose stool. Not to exceed 8 tablets in 24 hours.      Yes  Historical Provider, MD   magnesium hydroxide (MILK OF MAGNESIA) 400 MG/5ML suspension  Take 30 mLs by mouth at bedtime as needed. Standing order for constipation.      Yes  Historical Provider, MD   omeprazole (PRILOSEC) 20 MG capsule  Take 20 mg by mouth every morning.       Yes  Historical Provider, MD   potassium chloride (K-DUR,KLOR-CON) 10 MEQ tablet  Take 10 mEq by mouth every morning.       Yes  Historical Provider, MD   QUEtiapine (SEROQUEL) 25 MG tablet  Take 25 mg by mouth 2 (two) times daily.      Yes  Historical Provider, MD      Physical Exam: Filed Vitals:     07/28/12 1105  07/28/12 1327  07/28/12 1545  07/28/12 1609   BP:  172/97  172/109  164/103     Pulse:  117  67  61     Temp:  97.4 F (36.3 C)  97.3 F (36.3 C)   TempSrc:  Oral      Oral   Resp:  20  15  12      Height:  5\' 6"  (1.676 m)         Weight:  68.04 kg (150 lb)         SpO2:  94%  100%  100%         General:  Pt in NAD, Awake and Alert Eyes: EOMI, no icterus ENT: no masses on visual inspection, normal exterior appearance Neck: no masses on visual inspection, no goiter Cardiovascular: RRR, No MRG Respiratory: Clear to auscultation, no wheezes Abdomen: Soft, NT, ND Skin: no new rashes or diaphoresis Musculoskeletal: no clubbing or cyanosis Psychiatric: difficult to assess due to advanced dementia Neurologic: Patient moves all  extremities equally   Labs on Admission:  Basic Metabolic Panel: Lab  07/28/12 4696   NA  142   K  3.4*   CL  105   CO2  29   GLUCOSE  76   BUN  15   CREATININE  1.06   CALCIUM  9.5   MG  --   PHOS  --    Liver Function Tests: Lab  07/28/12 1233   AST  17   ALT  9   ALKPHOS  75   BILITOT  0.5   PROT  5.8*   ALBUMIN  3.4*    No results found for this basename: LIPASE:5,AMYLASE:5 in the last 168 hours No results found for this basename: AMMONIA:5 in the last 168 hours CBC: Lab  07/28/12 1233   WBC  5.7   NEUTROABS  3.7   HGB  13.5   HCT  40.3   MCV  92.2   PLT  218    Cardiac Enzymes: Lab  07/28/12 1233   CKTOTAL  40   CKMB  2.3   CKMBINDEX  --   TROPONINI  <0.30      BNP (last 3 results) No results found for this basename: PROBNP:3 in the last 8760 hours CBG: No results found for this basename: GLUCAP:5 in the last 168 hours   Radiological Exams on Admission: Dg Chest 1 View   07/28/2012  *RADIOLOGY REPORT*  Clinical Data: Fall.  Altered mental status.  CHEST - 1 VIEW  Comparison: Chest radiograph 06/25/2012  Findings: Left chest wall dual lead pacer with leads projecting over the right atrium and right ventricle.  Stable cardiomegaly. Small hiatal hernia with an air-fluid level.  A 10 mm radiopaque density projecting over the hiatal hernia may be a pill fragment within the herniated portion of the stomach.  This density was not present on multiple recent prior chest radiographs.  Pulmonary vascularity is normal and the lung fields are clear. There is no visible pleural effusion or pneumothorax. Cholecystectomy clips.  Visualized bowel gas pattern normal. Degenerative changes of the left glenohumeral joint.  IMPRESSION:  1.  Stable cardiomegaly with dual lead pacemaker.  No acute findings in the chest. 2.  Small hiatal hernia.  Question a radiodense pill fragment within the herniated portion of the stomach.   Original Report Authenticated By: Britta Mccreedy, M.D.       Dg Hip Bilateral W/pelvis   07/28/2012  *RADIOLOGY REPORT*  Clinical Data: Post fall, now with bilateral hip pain  BILATERAL HIP WITH PELVIS - 4+ VIEW  Comparison: None.  Findings:  There is apparent shortening of the right femoral neck which is worrisome for a minimally displaced  right subcapsular fracture. No definite fracture of the left hip.  Old left-sided superior and inferior pubic rami fractures.  Mild asymmetric degenerative change of the right hip with joint space loss and axial migration.  Lumbar spine degenerative change.  Visualized bowel gas pattern is normal. Vascular calcifications.  IMPRESSION: 1.  Findings worrisome for a minimally impacted right subcapsular femoral neck fracture.  Further evaluation with a hip CT may be performed as clinically indicated.  2.  Old left sided superior and inferior pubic rami fractures   Original Report Authenticated By: Waynard Reeds, M.D.     Ct Head Wo Contrast   07/28/2012  *RADIOLOGY REPORT*  Clinical Data:  History of trauma from a fall.  Head and neck pain. Dementia.  CT HEAD WITHOUT CONTRAST CT CERVICAL SPINE WITHOUT CONTRAST  Technique:  Multidetector CT imaging of the head and cervical spine was performed following the standard protocol without intravenous contrast.  Multiplanar CT image reconstructions of the cervical spine were also generated.  Comparison:  Head CT 06/25/2012.  C-spine CT 04/10/2012.  CT HEAD  Findings: Mild cerebral and cerebellar atrophy.  Old infarction in the left cerebellar hemisphere is unchanged.  There are extensive patchy and confluent areas of decreased attenuation throughout the deep and periventricular white matter of the cerebral hemispheres bilaterally, compatible with advanced chronic microvascular ischemic changes. No acute displaced skull fractures are identified.  No acute intracranial abnormality.  Specifically, no evidence of acute post-traumatic intracranial hemorrhage, no definite regions of  acute/subacute cerebral ischemia, no focal mass, mass effect, hydrocephalus or abnormal intra or extra-axial fluid collections.  The visualized paranasal sinuses and mastoids are well pneumatized.  IMPRESSION: 1.  No acute displaced skull fractures or findings to suggest significant acute traumatic injury to the head or brain. 2.  Cerebral and cerebellar atrophy with advanced chronic microvascular ischemic changes in the white matter and old left cerebellar infarct redemonstrated, as above.  CT CERVICAL SPINE  Findings: No acute displaced fractures of the cervical spine are appreciated.  There is advanced multilevel degenerative disc disease and severe multilevel facet arthropathy, most severe at C5- C6 where there is 3 mm of anterolisthesis of C5 upon C6.  There is also a 3 mm of anterolisthesis of T1 upon T2.  Both of these levels appears similar to prior study 04/10/2012.  Alignment is otherwise anatomic.  Prevertebral soft tissues are normal.  Visualized portions of the upper thorax are unremarkable.  IMPRESSION: 1.  No evidence of significant acute traumatic injury to the cervical spine. 2.  Severe multilevel degenerative disc disease and cervical spondylosis redemonstrated, as above.   Original Report Authenticated By: Florencia Reasons, M.D.     Ct Cervical Spine Wo Contrast   07/28/2012  *RADIOLOGY REPORT*  Clinical Data:  History of trauma from a fall.  Head and neck pain. Dementia.  CT HEAD WITHOUT CONTRAST CT CERVICAL SPINE WITHOUT CONTRAST  Technique:  Multidetector CT imaging of the head and cervical spine was performed following the standard protocol without intravenous contrast.  Multiplanar CT image reconstructions of the cervical spine were also generated.  Comparison:  Head CT 06/25/2012.  C-spine CT 04/10/2012.  CT HEAD  Findings: Mild cerebral and cerebellar atrophy.  Old infarction in the left cerebellar hemisphere is unchanged.  There are extensive patchy and confluent areas of decreased  attenuation throughout the deep and periventricular white matter of the cerebral hemispheres bilaterally, compatible with advanced chronic microvascular ischemic changes. No acute displaced skull fractures are identified.  No acute intracranial abnormality.  Specifically, no evidence of acute post-traumatic intracranial hemorrhage, no definite regions of acute/subacute cerebral ischemia, no focal mass, mass effect, hydrocephalus or abnormal intra or extra-axial fluid collections.  The visualized paranasal sinuses and mastoids are well pneumatized.  IMPRESSION: 1.  No acute displaced skull fractures or findings to suggest significant acute traumatic injury to the head or brain. 2.  Cerebral and cerebellar atrophy with advanced chronic microvascular ischemic changes in the white matter and old left cerebellar infarct redemonstrated, as above.  CT CERVICAL SPINE  Findings: No acute displaced fractures of the cervical spine are appreciated.  There is advanced multilevel degenerative disc disease and severe multilevel facet arthropathy, most severe at C5- C6 where there is 3 mm of anterolisthesis of C5 upon C6.  There is also a 3 mm of anterolisthesis of T1 upon T2.  Both of these levels appears similar to prior study 04/10/2012.  Alignment is otherwise anatomic.  Prevertebral soft tissues are normal.  Visualized portions of the upper thorax are unremarkable.  IMPRESSION: 1.  No evidence of significant acute traumatic injury to the cervical spine. 2.  Severe multilevel degenerative disc disease and cervical spondylosis redemonstrated, as above.   Original Report Authenticated By: Florencia Reasons, M.D.     Ct Pelvis Wo Contrast   07/28/2012  *RADIOLOGY REPORT*  Clinical Data:  Evaluate for right hip fracture.  The patient has a cardiac pacemaker.  CT PELVIS WITHOUT CONTRAST  Technique:  Multidetector CT imaging of the pelvis was performed following the standard protocol without intravenous contrast.  Comparison:   Pelvis bilateral hip radiographs 07/28/2012 and CT pelvis 02/01/2012.CT abdomen pelvis 07/18/2007.  Findings:  There is a remote healed fracture of the left inferior pubic ramus.  There is a small collar of osteophytes surrounding the right femoral head that is stable compared to the CT pelvis of 02/18/2012.  Appearance of the bony pelvis and right proximal femur is stable compared to recent pelvic CT.  No evidence of acute fracture is identified.  There are extensive facet joint degenerative changes of the lower lumbar spine.  Sacroiliac joints are aligned. Stable probable 5 mm bone island in the left acetabulum.  No evidence of hip joint effusion or focal soft tissue swelling adjacent either hip.  Atherosclerotic calcifications of the internal iliac vessels. Bilateral adnexal cysts are unchanged, measuring up to 5.7 x 3.4 cm on the right and 3.3 x 2.5 cm on the left.  The urinary bladder has a normal appearance.  Prominent amount of stool in the rectum. Colonic diverticulosis noted. Visualized bowel loops are normal in caliber.  Uterus is unremarkable for patient age.  Extensive atherosclerotic calcification of the proximal femoral vessels.  .  IMPRESSION: 1.  No evidence of acute bony injury of the right hip or bony pelvis.  A thin collar of osteophytes about the right femoral head may have simulated possible fracture on radiographs performed today. 2.  Bilateral adnexal (likely ovarian) cysts are again seen. Consider further evaluation with ultrasound.  These have increased in size since an abdomen pelvis CT of 07/18/2007. 3.  Remote healed left inferior pubic ramus fracture.   Original Report Authenticated By: Britta Mccreedy, M.D.     Dg Knee Complete 4 Views Left   07/28/2012  *RADIOLOGY REPORT*  Clinical Data: Fall.  Pain.  LEFT KNEE - COMPLETE 4+ VIEW  Comparison: Right knee radiographs 07/29/2011  Findings: Marked osteopenia.  Surgical changes of total right knee arthroplasty.  No acute fracture  is  identified.  Prominent fabella noted.  No evidence of joint effusion or focal soft tissue swelling.  IMPRESSION:  1.  Marked osteopenia.  No acute bony abnormality identified. 2.  Total knee arthroplasty.  No hardware complication identified.   Original Report Authenticated By: Britta Mccreedy, M.D.      EKG: Independently reviewed. Atrial fibrillation rate controlled.   Assessment/Plan Active Problems: Alzheimer's dementia Atrial fibrillation Fall Essential hypertension UTI      Fall - Reportedly patient had fracture but after CT scan patient did not have a fracture and had bone spurs.  Orthopaedic surgeon has evaluated patient and found no surgery.  Have not recommended further testing of her hips. - Will plan on treating pain with tylenol given that patient has history of nausea to opiods   2. Alzheimers dementia - Stable and will plan on continuing home regimen   3. UTI - Will plan on continuing cipro at this point - Treat as uncomplicated with today being day one of antibiotics.   4. Essential hypertension - Continue home regimen - likely higher blood pressure readings given that patient has had discomfort recently.   5. DVT prophylaxis - ted hose.   6. Hypokalemia - Continue home regimen of oral replacement.     Code Status: full code Family Communication: spoke with grand daughter Disposition Plan: D/C to SNF likely 9/8   Time spent: > 50 minutes   Penny Pia [facility] Pager [phone]   If 7PM-7AM, please contact night-coverage [email or web address] Password [facility] 07/28/2012, 6:02 PM

## 2012-10-09 ENCOUNTER — Emergency Department (HOSPITAL_COMMUNITY): Payer: Medicare Other

## 2012-10-09 ENCOUNTER — Emergency Department (HOSPITAL_COMMUNITY)
Admission: EM | Admit: 2012-10-09 | Discharge: 2012-10-09 | Disposition: A | Discharge status: Home / Self Care | Payer: Medicare Other | Attending: Emergency Medicine | Admitting: Emergency Medicine

## 2012-10-09 ENCOUNTER — Encounter (HOSPITAL_COMMUNITY): Payer: Self-pay | Admitting: *Deleted

## 2012-10-09 DIAGNOSIS — F411 Generalized anxiety disorder: Secondary | ICD-10-CM | POA: Insufficient documentation

## 2012-10-09 DIAGNOSIS — M542 Cervicalgia: Secondary | ICD-10-CM | POA: Insufficient documentation

## 2012-10-09 DIAGNOSIS — F329 Major depressive disorder, single episode, unspecified: Secondary | ICD-10-CM | POA: Insufficient documentation

## 2012-10-09 DIAGNOSIS — Z79899 Other long term (current) drug therapy: Secondary | ICD-10-CM | POA: Insufficient documentation

## 2012-10-09 DIAGNOSIS — R296 Repeated falls: Secondary | ICD-10-CM | POA: Insufficient documentation

## 2012-10-09 DIAGNOSIS — I2510 Atherosclerotic heart disease of native coronary artery without angina pectoris: Secondary | ICD-10-CM | POA: Insufficient documentation

## 2012-10-09 DIAGNOSIS — Y939 Activity, unspecified: Secondary | ICD-10-CM | POA: Insufficient documentation

## 2012-10-09 DIAGNOSIS — S329XXA Fracture of unspecified parts of lumbosacral spine and pelvis, initial encounter for closed fracture: Secondary | ICD-10-CM | POA: Insufficient documentation

## 2012-10-09 DIAGNOSIS — S199XXA Unspecified injury of neck, initial encounter: Secondary | ICD-10-CM | POA: Insufficient documentation

## 2012-10-09 DIAGNOSIS — M1990 Unspecified osteoarthritis, unspecified site: Secondary | ICD-10-CM | POA: Insufficient documentation

## 2012-10-09 DIAGNOSIS — Y9289 Other specified places as the place of occurrence of the external cause: Secondary | ICD-10-CM | POA: Insufficient documentation

## 2012-10-09 DIAGNOSIS — Z7982 Long term (current) use of aspirin: Secondary | ICD-10-CM | POA: Insufficient documentation

## 2012-10-09 DIAGNOSIS — S0993XA Unspecified injury of face, initial encounter: Secondary | ICD-10-CM | POA: Insufficient documentation

## 2012-10-09 DIAGNOSIS — K219 Gastro-esophageal reflux disease without esophagitis: Secondary | ICD-10-CM | POA: Insufficient documentation

## 2012-10-09 DIAGNOSIS — M129 Arthropathy, unspecified: Secondary | ICD-10-CM | POA: Insufficient documentation

## 2012-10-09 DIAGNOSIS — F0390 Unspecified dementia without behavioral disturbance: Secondary | ICD-10-CM | POA: Insufficient documentation

## 2012-10-09 DIAGNOSIS — I4891 Unspecified atrial fibrillation: Secondary | ICD-10-CM | POA: Insufficient documentation

## 2012-10-09 DIAGNOSIS — I495 Sick sinus syndrome: Secondary | ICD-10-CM | POA: Insufficient documentation

## 2012-10-09 DIAGNOSIS — I10 Essential (primary) hypertension: Secondary | ICD-10-CM | POA: Insufficient documentation

## 2012-10-09 DIAGNOSIS — F3289 Other specified depressive episodes: Secondary | ICD-10-CM | POA: Insufficient documentation

## 2012-10-09 LAB — URINALYSIS, ROUTINE W REFLEX MICROSCOPIC
Glucose, UA: NEGATIVE mg/dL
Hgb urine dipstick: NEGATIVE
pH: 5 (ref 5.0–8.0)

## 2012-10-09 LAB — URINE MICROSCOPIC-ADD ON

## 2012-10-09 MED ORDER — HYDROCODONE-ACETAMINOPHEN 5-325 MG PO TABS: 1.0000 | ORAL_TABLET | Freq: Four times a day (QID) | ORAL | Status: DC | PRN

## 2012-10-09 NOTE — ED Notes (Signed)
Pt now stating her back hurts

## 2012-10-09 NOTE — ED Notes (Signed)
[id number]<AV> Expected date:<BR> Expected time:<BR> Means of arrival:Ambulance<BR> Comments:<BR> [id number]

## 2012-10-09 NOTE — ED Notes (Signed)
Pt is confused, when asked why she is at the hospital states "I dunno my sister brought me here", when asked if she fell pt states "Not yet". Pt states her R hip is hurting, denies any back or shoulder pain at this time. Pt on LSB w/ ccollar on.

## 2012-10-09 NOTE — ED Notes (Signed)
[id number]<AV> Expected date:<BR> Expected time:<BR> Means of arrival:Ambulance<BR> Comments:<BR> Fall

## 2012-10-09 NOTE — ED Notes (Signed)
[facility] called to transport pt back to [facility]

## 2012-10-09 NOTE — ED Notes (Signed)
Per EMS pt from [facility], went to sit down in wheelchair and fell to floor, complaining of R shoulder and neck pain and back of head pain. Tenderness thorasic region in middle below shoulder blades. CBG 83, BP 162/102, HR 72. A/o x person and place.

## 2012-10-09 NOTE — ED Provider Notes (Signed)
History     CSN: [id number]  Arrival date & time 10/09/12  1110   First MD Initiated Contact with Patient 10/09/12 1129      Chief Complaint  Patient presents with  . Shoulder Pain  . Neck Pain  . Fall    (Consider location/radiation/quality/duration/timing/severity/associated sxs/prior treatment) HPI Comments: Pt comes in with cc of fall. LEVEL 5 CAVEAT FOR SEVERE DEMENTIA Pt lives at a nursing home,and per report had a mechanical fall. Family at bedside and confirms that pt will frequently remove her restraints, and end up with a fall. Pt is typically wheel chair bound. Pt has no complains at my evaluation, but at some point mentioned headache, shoulder and neck pain.. Pt has no recollection of the fall itself.    Patient is a 78 y.o. female presenting with shoulder pain, neck pain, and fall. The history is provided by the patient.  Shoulder Pain  Neck Pain   Fall    Past Medical History  Diagnosis Date  . Anxiety   . Coronary artery disease   . Depression   . Hypertension   . Tachy-brady syndrome   . PAF (paroxysmal atrial fibrillation)   . HX: anticoagulation   . Glaucoma(365)   . Dizziness   . SSS (sick sinus syndrome)   . GERD (gastroesophageal reflux disease)   . Fall   . Osteoarthritis   . Dementia     Past Surgical History  Procedure Date  . Insert / replace / remove pacemaker 01/24/2008    DDD PACEMAKER IMPLANT  . Cholecystectomy   . Gastric bypass   . Breast reduction surgery   . US echocardiography 08/10/2007    EF 60%  . US echocardiography 01/12/2007    EF 70%  . Cardiovascular stress test 02/15/2007  . Total knee arthroplasty     right  . Pacemaker insertion     Family History  Problem Relation Age of Onset  . Kidney failure Mother   . Heart attack Father   . Heart attack Sister   . Heart attack Brother     History  Substance Use Topics  . Smoking status: Former Smoker    Quit date: 07/17/1961  . Smokeless tobacco: Not on  file  . Alcohol Use: No    OB History    Grav Para Term Preterm Abortions TAB SAB Ect Mult Living                  Review of Systems  Unable to perform ROS: Dementia  HENT: Positive for neck pain.   Musculoskeletal: Positive for arthralgias.    Allergies  Codeine and Iodine  Home Medications   Current Outpatient Rx  Name  Route  Sig  Dispense  Refill  . ACETAMINOPHEN 500 MG PO TABS   Oral   Take 500 mg by mouth every 8 (eight) hours.         . AMIODARONE HCL 200 MG PO TABS   Oral   Take 200 mg by mouth daily.          . ASPIRIN 325 MG PO TABS   Oral   Take 325 mg by mouth daily.         . ATENOLOL 50 MG PO TABS   Oral   Take 50 mg by mouth daily.           . AZELASTINE HCL 0.05 % OP SOLN   Both Eyes   Place 1 drop into both eyes daily.          Marland Kitchen  BUPROPION HCL ER (XL) 300 MG PO TB24   Oral   Take 300 mg by mouth daily.           Marland Kitchen VITAMIN D 1000 UNITS PO TABS   Oral   Take 1,000 Units by mouth daily.         Marland Kitchen DOCUSATE SODIUM 100 MG PO CAPS   Oral   Take 100 mg by mouth daily.          . DONEPEZIL HCL 10 MG PO TABS   Oral   Take 10 mg by mouth daily.          . GUAIFENESIN-CODEINE 100-10 MG/5ML PO SYRP   Oral   Take 10 mLs by mouth 4 (four) times daily as needed. Standing order for cough.         Marland Kitchen LATANOPROST 0.005 % OP SOLN   Both Eyes   Place 1 drop into both eyes daily.          Marland Kitchen LISINOPRIL 10 MG PO TABS   Oral   Take 10 mg by mouth daily.         Marland Kitchen LOPERAMIDE HCL 2 MG PO TABS   Oral   Take 2 mg by mouth as needed. With each loose stool. Not to exceed 8 tablets in 24 hours.         Marland Kitchen MAGNESIUM HYDROXIDE 400 MG/5ML PO SUSP   Oral   Take 30 mLs by mouth at bedtime as needed. Standing order for constipation.         Marland Kitchen MIRTAZAPINE 7.5 MG PO TABS   Oral   Take 7.5 mg by mouth at bedtime.         . OMEPRAZOLE 20 MG PO CPDR   Oral   Take 20 mg by mouth every morning.          Marland Kitchen POTASSIUM CHLORIDE CRYS  ER 10 MEQ PO TBCR   Oral   Take 10 mEq by mouth every morning.          Marland Kitchen QUETIAPINE FUMARATE 25 MG PO TABS   Oral   Take 25 mg by mouth 2 (two) times daily.         . SERTRALINE HCL 50 MG PO TABS   Oral   Take 50 mg by mouth daily.         Marland Kitchen ALUM & MAG HYDROXIDE-SIMETH 200-200-20 MG/5ML PO SUSP   Oral   Take 30 mLs by mouth as needed. Standing order for heartburn /indigestion.Not to exceed 4 doses in 24 hours.           BP 167/98  Pulse 63  Temp 97.4 F (36.3 C) (Oral)  Resp 16  SpO2 99%  Physical Exam  Nursing note and vitals reviewed. Constitutional: She appears well-developed and well-nourished.  HENT:  Head: Normocephalic and atraumatic.       c-collar on, has no cspine tenderness  Eyes: EOM are normal. Pupils are equal, round, and reactive to light.  Neck: Neck supple.  Cardiovascular: Normal rate, regular rhythm and normal heart sounds.   No murmur heard. Pulmonary/Chest: Effort normal. No respiratory distress.  Abdominal: Soft. She exhibits no distension. There is no tenderness. There is no rebound and no guarding.  Musculoskeletal:       Head to toe evaluation shows no hematoma, bleeding of the scalp, no facial abrasions, step offs, crepitus, no tenderness to palpation of the bilateral upper and lower extremities, no gross deformities, no chest tenderness,  no pelvic pain.  There was mild tenderness over the suprapubic region  Neurological: She is alert. No cranial nerve deficit.  Skin: Skin is warm and dry.    ED Course  Procedures (including critical care time)   Labs Reviewed  URINALYSIS, ROUTINE W REFLEX MICROSCOPIC  URINE CULTURE   Ct Head Wo Contrast  10/09/2012  *RADIOLOGY REPORT*  Clinical Data:  History of fall complaining of neck pain.  CT HEAD WITHOUT CONTRAST CT CERVICAL SPINE WITHOUT CONTRAST  Technique:  Multidetector CT imaging of the head and cervical spine was performed following the standard protocol without intravenous  contrast.  Multiplanar CT image reconstructions of the cervical spine were also generated.  Comparison:  Head CT and cervical spine CT 07/28/2012.  CT HEAD  Findings: Mild cerebral and cerebellar atrophy is unchanged.  Old lacunar infarction in the left cerebellar hemisphere is unchanged. Extensive patchy and confluent areas of decreased attenuation throughout the deep and periventricular white matter of the cerebral hemispheres bilaterally is compatible with advanced chronic microvascular ischemic disease, and is similar to the prior examination. No acute displaced skull fractures are identified.  No acute intracranial abnormality.  Specifically, no evidence of acute post-traumatic intracranial hemorrhage, no definite regions of acute/subacute cerebral ischemia, no focal mass, mass effect, hydrocephalus or abnormal intra or extra-axial fluid collections. The visualized paranasal sinuses and mastoids are well pneumatized.  IMPRESSION: 1.  No evidence of significant acute traumatic injury to the head or brain. 2.  Cerebral and cerebellar atrophy with advanced chronic microvascular ischemic changes in the cerebral white matter and old lacunar infarction in the left cerebellar hemisphere.  Appearance is similar to the recent prior study 07/28/2012.  CT CERVICAL SPINE  Findings: No acute displaced cervical spine fracture is identified. There is severe multilevel degenerative disc disease and cervical spondylosis, there is multilevel degenerative disc disease, most severe at C5-C6, and severe multilevel facet arthropathy. 3 mm of anterolisthesis of C5 upon C6 and 3 mm of anterolisthesis of T1 upon T2 are unchanged.  Alignment is otherwise anatomic. Prevertebral soft tissues are normal.  Visualized portions of the lung apices are unremarkable.  IMPRESSION: 1.  No evidence of significant acute traumatic injury to the cervical spine. 2.  Multilevel degenerative disc disease and cervical spondylosis, as above, similar to  prior examinations.   Original Report Authenticated By: Trudie Reed, M.D.    Ct Cervical Spine Wo Contrast  10/09/2012  *RADIOLOGY REPORT*  Clinical Data:  History of fall complaining of neck pain.  CT HEAD WITHOUT CONTRAST CT CERVICAL SPINE WITHOUT CONTRAST  Technique:  Multidetector CT imaging of the head and cervical spine was performed following the standard protocol without intravenous contrast.  Multiplanar CT image reconstructions of the cervical spine were also generated.  Comparison:  Head CT and cervical spine CT 07/28/2012.  CT HEAD  Findings: Mild cerebral and cerebellar atrophy is unchanged.  Old lacunar infarction in the left cerebellar hemisphere is unchanged. Extensive patchy and confluent areas of decreased attenuation throughout the deep and periventricular white matter of the cerebral hemispheres bilaterally is compatible with advanced chronic microvascular ischemic disease, and is similar to the prior examination. No acute displaced skull fractures are identified.  No acute intracranial abnormality.  Specifically, no evidence of acute post-traumatic intracranial hemorrhage, no definite regions of acute/subacute cerebral ischemia, no focal mass, mass effect, hydrocephalus or abnormal intra or extra-axial fluid collections. The visualized paranasal sinuses and mastoids are well pneumatized.  IMPRESSION: 1.  No evidence of significant acute traumatic  injury to the head or brain. 2.  Cerebral and cerebellar atrophy with advanced chronic microvascular ischemic changes in the cerebral white matter and old lacunar infarction in the left cerebellar hemisphere.  Appearance is similar to the recent prior study 07/28/2012.  CT CERVICAL SPINE  Findings: No acute displaced cervical spine fracture is identified. There is severe multilevel degenerative disc disease and cervical spondylosis, there is multilevel degenerative disc disease, most severe at C5-C6, and severe multilevel facet arthropathy. 3 mm  of anterolisthesis of C5 upon C6 and 3 mm of anterolisthesis of T1 upon T2 are unchanged.  Alignment is otherwise anatomic. Prevertebral soft tissues are normal.  Visualized portions of the lung apices are unremarkable.  IMPRESSION: 1.  No evidence of significant acute traumatic injury to the cervical spine. 2.  Multilevel degenerative disc disease and cervical spondylosis, as above, similar to prior examinations.   Original Report Authenticated By: Trudie Reed, M.D.    Dg Pelvis Portable  10/09/2012  *RADIOLOGY REPORT*  Clinical Data: Larey Seat today with pain  PORTABLE PELVIS  Comparison: CT of the pelvis of 07/28/2012  Findings: There is cortical irregularity of the left inferior pelvic ramus suspicious for nondisplaced fracture.  No other acute abnormality is seen.  The SI joints appear symmetrical and normal. There are degenerative changes in both hips and in the lumbar spine.  IMPRESSION: Apparent nondisplaced fracture of the left inferior pelvic ramus.   Original Report Authenticated By: Dwyane Dee, M.D.      No diagnosis found.    MDM  DDx includes: - Mechanical falls - ICH - Fractures - Contusions - Soft tissue injury Pt comes in post fall. Pt's exam is not showing any gross evidence of severe trauma. Pt has some suprapubic pain - will check UA along with hip and pelvis films. CT head and spine ordered.  1:42 PM CT scans neg. There is a non displaced fracture of the hip that was discussed with the patient and family. The fracture is in the ramus, and is stable, and since she is immobile at baseline, and at a nursing home, she is appropriate for management at the nursing home.         Derwood Kaplan, MD 10/09/12 1343

## 2012-10-11 LAB — URINE CULTURE: Colony Count: 15000

## 2012-10-27 ENCOUNTER — Emergency Department (HOSPITAL_COMMUNITY)
Admission: EM | Admit: 2012-10-27 | Discharge: 2012-10-27 | Disposition: A | Discharge status: Home / Self Care | Payer: Medicare Other | Attending: Emergency Medicine | Admitting: Emergency Medicine

## 2012-10-27 ENCOUNTER — Encounter (HOSPITAL_COMMUNITY): Payer: Self-pay | Admitting: *Deleted

## 2012-10-27 DIAGNOSIS — F3289 Other specified depressive episodes: Secondary | ICD-10-CM | POA: Insufficient documentation

## 2012-10-27 DIAGNOSIS — F329 Major depressive disorder, single episode, unspecified: Secondary | ICD-10-CM | POA: Insufficient documentation

## 2012-10-27 DIAGNOSIS — X58XXXA Exposure to other specified factors, initial encounter: Secondary | ICD-10-CM | POA: Insufficient documentation

## 2012-10-27 DIAGNOSIS — IMO0002 Reserved for concepts with insufficient information to code with codable children: Secondary | ICD-10-CM | POA: Insufficient documentation

## 2012-10-27 DIAGNOSIS — I2510 Atherosclerotic heart disease of native coronary artery without angina pectoris: Secondary | ICD-10-CM | POA: Insufficient documentation

## 2012-10-27 DIAGNOSIS — Y939 Activity, unspecified: Secondary | ICD-10-CM | POA: Insufficient documentation

## 2012-10-27 DIAGNOSIS — F411 Generalized anxiety disorder: Secondary | ICD-10-CM | POA: Insufficient documentation

## 2012-10-27 DIAGNOSIS — I4891 Unspecified atrial fibrillation: Secondary | ICD-10-CM | POA: Insufficient documentation

## 2012-10-27 DIAGNOSIS — F0390 Unspecified dementia without behavioral disturbance: Secondary | ICD-10-CM | POA: Insufficient documentation

## 2012-10-27 DIAGNOSIS — Z79899 Other long term (current) drug therapy: Secondary | ICD-10-CM | POA: Insufficient documentation

## 2012-10-27 DIAGNOSIS — I10 Essential (primary) hypertension: Secondary | ICD-10-CM | POA: Insufficient documentation

## 2012-10-27 DIAGNOSIS — S51819A Laceration without foreign body of unspecified forearm, initial encounter: Secondary | ICD-10-CM

## 2012-10-27 DIAGNOSIS — Y929 Unspecified place or not applicable: Secondary | ICD-10-CM | POA: Insufficient documentation

## 2012-10-27 DIAGNOSIS — Z87891 Personal history of nicotine dependence: Secondary | ICD-10-CM | POA: Insufficient documentation

## 2012-10-27 DIAGNOSIS — M1990 Unspecified osteoarthritis, unspecified site: Secondary | ICD-10-CM | POA: Insufficient documentation

## 2012-10-27 NOTE — ED Notes (Signed)
Pt in from [facility]. Facility staff member reports she went to check on pt and there is a 4x4 gauze present to L FA that was not there when that staff member saw her previously Thursday, and she has not been informed of a fall or injury from previous shift. Staff unable to remove bandage from arm. Hx dementia.

## 2012-10-27 NOTE — ED Notes (Signed)
[id number]<WG> Expected date:<BR> Expected time:<BR> Means of arrival:<BR> Comments:<BR> Elderly, fall; Skin tear

## 2012-10-27 NOTE — ED Provider Notes (Signed)
History     CSN: [id number]  Arrival date & time 10/27/12  1519   First MD Initiated Contact with Patient 10/27/12 1538      Chief Complaint  Patient presents with  . Fall    (Consider location/radiation/quality/duration/timing/severity/associated sxs/prior treatment) HPI Pt coming from [facility]. She is confused with baseline dementia. She is unable to contribute to history. Level 5 caveat. Per EMS, they were called due L forearm skin tear that had been treated over the weekend with 4x4 guaze and staff member was unable to remove bandage. Pt currently not complaining of fall. No falls today. No head or neck injury.  Past Medical History  Diagnosis Date  . Anxiety   . Coronary artery disease   . Depression   . Hypertension   . Tachy-brady syndrome   . PAF (paroxysmal atrial fibrillation)   . HX: anticoagulation   . Glaucoma(365)   . Dizziness   . SSS (sick sinus syndrome)   . GERD (gastroesophageal reflux disease)   . Fall   . Osteoarthritis   . Dementia     Past Surgical History  Procedure Date  . Insert / replace / remove pacemaker 01/24/2008    DDD PACEMAKER IMPLANT  . Cholecystectomy   . Gastric bypass   . Breast reduction surgery   . US echocardiography 08/10/2007    EF 60%  . US echocardiography 01/12/2007    EF 70%  . Cardiovascular stress test 02/15/2007  . Total knee arthroplasty     right  . Pacemaker insertion     Family History  Problem Relation Age of Onset  . Kidney failure Mother   . Heart attack Father   . Heart attack Sister   . Heart attack Brother     History  Substance Use Topics  . Smoking status: Former Smoker    Quit date: 07/17/1961  . Smokeless tobacco: Not on file  . Alcohol Use: No    OB History    Grav Para Term Preterm Abortions TAB SAB Ect Mult Living                  Review of Systems  Unable to perform ROS: Dementia    Allergies  Codeine and Iodine  Home Medications   Current Outpatient Rx  Name   Route  Sig  Dispense  Refill  . ACETAMINOPHEN 500 MG PO TABS   Oral   Take 500 mg by mouth every 4 (four) hours as needed. fever         . AMIODARONE HCL 200 MG PO TABS   Oral   Take 200 mg by mouth daily.          . ASPIRIN 325 MG PO TABS   Oral   Take 325 mg by mouth daily.         . ATENOLOL 50 MG PO TABS   Oral   Take 50 mg by mouth daily.           . AZELASTINE HCL 0.05 % OP SOLN   Both Eyes   Place 1 drop into both eyes 2 (two) times daily.          Marland Kitchen VITAMIN D 1000 UNITS PO TABS   Oral   Take 1,000 Units by mouth daily.         Marland Kitchen CRANBERRY PLUS VITAMIN C PO   Oral   Take 2 capsules by mouth at bedtime.         Marland Kitchen  DOCUSATE SODIUM 100 MG PO CAPS   Oral   Take 100 mg by mouth 2 (two) times daily.          . DONEPEZIL HCL 10 MG PO TABS   Oral   Take 10 mg by mouth daily.          Marland Kitchen LATANOPROST 0.005 % OP SOLN   Both Eyes   Place 1 drop into both eyes daily.          Marland Kitchen LISINOPRIL 10 MG PO TABS   Oral   Take 10 mg by mouth daily.         Marland Kitchen MIRTAZAPINE 7.5 MG PO TABS   Oral   Take 7.5 mg by mouth at bedtime.         . OMEPRAZOLE 20 MG PO CPDR   Oral   Take 20 mg by mouth every morning.          Marland Kitchen SYSTANE OP   Both Eyes   Place 2 drops into both eyes 4 (four) times daily as needed. Itchy eyes         . POTASSIUM CHLORIDE CRYS ER 10 MEQ PO TBCR   Oral   Take 10 mEq by mouth every morning.          Marland Kitchen QUETIAPINE FUMARATE 25 MG PO TABS   Oral   Take 25 mg by mouth 2 (two) times daily.         . SERTRALINE HCL 50 MG PO TABS   Oral   Take 50 mg by mouth daily.         Marland Kitchen ALUM & MAG HYDROXIDE-SIMETH 200-200-20 MG/5ML PO SUSP   Oral   Take 30 mLs by mouth as needed. Standing order for heartburn /indigestion.Not to exceed 4 doses in 24 hours.         . GUAIFENESIN-CODEINE 100-10 MG/5ML PO SYRP   Oral   Take 10 mLs by mouth 4 (four) times daily as needed. Standing order for cough.         Marland Kitchen HYDROCODONE-ACETAMINOPHEN  5-325 MG PO TABS   Oral   Take 1 tablet by mouth every 6 (six) hours as needed for pain.   10 tablet   0   . MAGNESIUM HYDROXIDE 400 MG/5ML PO SUSP   Oral   Take 30 mLs by mouth at bedtime as needed. Standing order for constipation.           BP 143/76  Pulse 71  Temp 98.5 F (36.9 C) (Oral)  Resp 18  SpO2 96%  Physical Exam  Nursing note and vitals reviewed. Constitutional: She appears well-developed and well-nourished. No distress.  HENT:  Head: Normocephalic and atraumatic.  Mouth/Throat: Oropharynx is clear and moist.  Eyes: EOM are normal. Pupils are equal, round, and reactive to light.  Neck: Normal range of motion. Neck supple.       No posterior cervical tenderness  Cardiovascular: Normal rate and regular rhythm.   Pulmonary/Chest: Effort normal and breath sounds normal. No respiratory distress. She has no wheezes. She has no rales. She exhibits no tenderness.  Abdominal: Soft. Bowel sounds are normal. She exhibits no distension and no mass. There is no tenderness. There is no rebound and no guarding.  Musculoskeletal: Normal range of motion. She exhibits no edema and no tenderness.       No deformity, no swelling  Neurological: She is alert.       5/5 motor in all ext, oriented to self.  Skin: Skin is warm and dry. No rash noted. No erythema.       Superficial L forearm skin tear, partially healing with imbedded 4x4 in wound. No underlying deformity. No evidence of infection.   Psychiatric: She has a normal mood and affect. Her behavior is normal.    ED Course  Procedures (including critical care time)  Labs Reviewed - No data to display No results found.   1. Skin tear of forearm without complication       MDM  4x4 removed. + mild fresh bleeding. Non-adhesive bandage applied and then Kerlix placed.          Loren Racer, MD 10/27/12 [phone]

## 2012-10-27 NOTE — ED Notes (Signed)
SN contacted pt dtr for transport back to facility, pt dtr coming to take pt.

## 2012-10-27 NOTE — ED Notes (Signed)
Family at [facility] states they came to see pt and pt had dry gauze to L arm and no report of fall or injury noted. Dsg was removed by Dr Ranae Palms and redressed by Dr Ranae Palms.

## 2012-10-27 NOTE — ED Notes (Signed)
Family at bedside. 

## 2012-12-18 ENCOUNTER — Encounter: Payer: Self-pay | Admitting: *Deleted

## 2013-01-16 ENCOUNTER — Ambulatory Visit (INDEPENDENT_AMBULATORY_CARE_PROVIDER_SITE_OTHER): Payer: Medicare Other | Admitting: Internal Medicine

## 2013-01-16 ENCOUNTER — Encounter: Payer: Self-pay | Admitting: *Deleted

## 2013-01-16 ENCOUNTER — Encounter: Payer: Medicare Other | Admitting: *Deleted

## 2013-01-16 ENCOUNTER — Other Ambulatory Visit: Payer: Self-pay | Admitting: *Deleted

## 2013-01-16 ENCOUNTER — Encounter: Payer: Self-pay | Admitting: Internal Medicine

## 2013-01-16 DIAGNOSIS — Z45018 Encounter for adjustment and management of other part of cardiac pacemaker: Secondary | ICD-10-CM

## 2013-01-16 DIAGNOSIS — I495 Sick sinus syndrome: Secondary | ICD-10-CM

## 2013-01-16 DIAGNOSIS — I4891 Unspecified atrial fibrillation: Secondary | ICD-10-CM

## 2013-01-16 LAB — PACEMAKER DEVICE OBSERVATION
BAMS-0001: 170 {beats}/min
BRDY-0002RV: 60 {beats}/min
RV LEAD AMPLITUDE: 5.2 mv
RV LEAD IMPEDENCE PM: 392 Ohm
RV LEAD THRESHOLD: 1 V

## 2013-01-16 LAB — CBC WITH DIFFERENTIAL/PLATELET
Basophils Relative: 0.3 % (ref 0.0–3.0)
Eosinophils Absolute: 0.1 10*3/uL (ref 0.0–0.7)
MCHC: 34.4 g/dL (ref 30.0–36.0)
MCV: 95 fl (ref 78.0–100.0)
Monocytes Absolute: 0.5 10*3/uL (ref 0.1–1.0)
Neutrophils Relative %: 67.9 % (ref 43.0–77.0)
RBC: 4.13 Mil/uL (ref 3.87–5.11)

## 2013-01-16 LAB — BASIC METABOLIC PANEL
BUN: 16 mg/dL (ref 6–23)
CO2: 28 mEq/L (ref 19–32)
Chloride: 105 mEq/L (ref 96–112)
Creatinine, Ser: 1 mg/dL (ref 0.4–1.2)

## 2013-01-16 NOTE — Patient Instructions (Signed)
Your physician has recommended that you have a pacemaker generator change   See instruction sheet

## 2013-01-21 ENCOUNTER — Encounter: Payer: Self-pay | Admitting: Internal Medicine

## 2013-01-21 ENCOUNTER — Telehealth: Payer: Self-pay | Admitting: Internal Medicine

## 2013-01-21 MED ORDER — CEFAZOLIN SODIUM-DEXTROSE 2-3 GM-% IV SOLR
2.0000 g | INTRAVENOUS | Status: AC
  Filled 2013-01-21: qty 50

## 2013-01-21 MED ORDER — SODIUM CHLORIDE 0.45 % IV SOLN: INTRAVENOUS | Status: DC

## 2013-01-21 MED ORDER — SODIUM CHLORIDE 0.9 % IR SOLN
80.0000 mg | Status: AC
  Filled 2013-01-21: qty 2

## 2013-01-21 MED ORDER — SODIUM CHLORIDE 0.9 % IJ SOLN: 3.0000 mL | Freq: Two times a day (BID) | INTRAMUSCULAR | Status: DC

## 2013-01-21 MED ORDER — SODIUM CHLORIDE 0.9 % IJ SOLN: 3.0000 mL | INTRAMUSCULAR | Status: DC | PRN

## 2013-01-21 MED ORDER — SODIUM CHLORIDE 0.9 % IV SOLN: 250.0000 mL | INTRAVENOUS | Status: DC

## 2013-01-21 MED ORDER — CHLORHEXIDINE GLUCONATE 4 % EX LIQD
60.0000 mL | Freq: Once | CUTANEOUS | Status: DC
  Filled 2013-01-21: qty 60

## 2013-01-21 NOTE — Telephone Encounter (Signed)
New problem   Pt is sched to have pacermaker replacement sx tomorrow 01/22/13 @ 12:00. Pt would like to r/s. Please call pt concerning this matter.

## 2013-01-21 NOTE — Telephone Encounter (Signed)
Spoke with daughter, she was trying to cancel the generator change for her mom for tomorrow due to it being cold and her mom being on blood thinners.  I explained to her that her device has been at [facility] since Dec and how important it is that she get this done.  She has cancled several appointments because of the same reasons.  I offered her other times, but every one she had conflicts.  She wanted this Wed but Dr Johney Frame is in the office.  She has left things as they are and will be at the hospital at 12noon.  I told her the weather should be ok by 12 tomorrow and should be hopefully in the 40's by that time

## 2013-01-21 NOTE — Progress Notes (Signed)
ELECTROPHYSIOLOGY OFFICE NOTE  Patient ID: Alison Dodson MRN: [id number], DOB/AGE: 79 years old   Date of Visit: 01/21/2013  Primary Physician: Guerry Bruin, MD Primary Cardiologist: [place], MD Reason for Visit: Device folllow-up  History of Present Illness Alison Dodson is an 79 year old woman with permanent and tachy-brady syndrome s/p PPM implantation by Dr. Reyes Ivan in 2009 who presents today for routine device follow-up. She is accompanied by her daughter. She denies any cardiac complaints.  She has reached ERI battery status.  She denies CP, SOB, palpitations, dizziness, near syncope or syncope. Of note, she is not on Coumadin due to frequent falls.  Past Medical History  Diagnosis Date  . Anxiety   . Coronary artery disease   . Depression   . Hypertension   . Tachy-brady syndrome   . Permanent atrial fibrillation   . HX: anticoagulation   . Glaucoma(365)   . Dizziness   . SSS (sick sinus syndrome)   . GERD (gastroesophageal reflux disease)   . Fall   . Osteoarthritis   . Dementia      Past Surgical History  Procedure Laterality Date  . Insert / replace / remove pacemaker  01/24/2008    DDD PACEMAKER IMPLANT  . Cholecystectomy    . Gastric bypass    . Breast reduction surgery    . US echocardiography  08/10/2007    EF 60%  . US echocardiography  01/12/2007    EF 70%  . Cardiovascular stress test  02/15/2007  . Total knee arthroplasty      right  . Pacemaker insertion       Allergies/Intolerances Allergies  Allergen Reactions  . Codeine Other (See Comments)    unknown  . Iodine Other (See Comments)    unknown    Current Home Medications Current Outpatient Prescriptions  Medication Sig Dispense Refill  . acetaminophen (TYLENOL) 500 MG tablet Take 500 mg by mouth every 4 (four) hours as needed. fever      . alum & mag hydroxide-simeth (MI-ACID) 200-200-20 MG/5ML suspension Take 30 mLs by mouth as needed. Standing order for heartburn /indigestion.Not to exceed  4 doses in 24 hours.      Marland Kitchen amiodarone (PACERONE) 200 MG tablet Take 200 mg by mouth daily.       Marland Kitchen aspirin 325 MG tablet Take 325 mg by mouth daily.      Marland Kitchen atenolol (TENORMIN) 50 MG tablet Take 50 mg by mouth daily.        Marland Kitchen azelastine (OPTIVAR) 0.05 % ophthalmic solution Place 1 drop into both eyes 2 (two) times daily.       . cholecalciferol (VITAMIN D) 1000 UNITS tablet Take 1,000 Units by mouth daily.      . Cranberry-Vitamin C-Vitamin E (CRANBERRY PLUS VITAMIN C PO) Take 2 capsules by mouth at bedtime.      . docusate sodium (STOOL SOFTENER) 100 MG capsule Take 100 mg by mouth 2 (two) times daily.       Marland Kitchen donepezil (ARICEPT) 10 MG tablet Take 10 mg by mouth daily.       Marland Kitchen guaiFENesin-codeine (IOPHEN C-NR) 100-10 MG/5ML syrup Take 10 mLs by mouth 4 (four) times daily as needed. Standing order for cough.      Marland Kitchen HYDROcodone-acetaminophen (NORCO/VICODIN) 5-325 MG per tablet Take 1 tablet by mouth every 6 (six) hours as needed for pain.  10 tablet  0  . latanoprost (XALATAN) 0.005 % ophthalmic solution Place 1 drop into both eyes daily.       Marland Kitchen  lisinopril (PRINIVIL,ZESTRIL) 10 MG tablet Take 20 mg by mouth daily.       . magnesium hydroxide (MILK OF MAGNESIA) 400 MG/5ML suspension Take 30 mLs by mouth at bedtime as needed. Standing order for constipation.      . mirtazapine (REMERON) 7.5 MG tablet Take 7.5 mg by mouth at bedtime.      Marland Kitchen omeprazole (PRILOSEC) 20 MG capsule Take 20 mg by mouth every morning.       Bertram Gala Glycol-Propyl Glycol (SYSTANE OP) Place 2 drops into both eyes 4 (four) times daily as needed. Itchy eyes      . potassium chloride (K-DUR,KLOR-CON) 10 MEQ tablet Take 10 mEq by mouth every morning.       Marland Kitchen QUEtiapine (SEROQUEL) 25 MG tablet Take 25 mg by mouth 2 (two) times daily.      . sertraline (ZOLOFT) 50 MG tablet Take 50 mg by mouth daily.       No current facility-administered medications for this visit.   Facility-Administered Medications Ordered in Other Visits   Medication Dose Route Frequency Provider Last Rate Last Dose  . 0.45 % sodium chloride infusion   Intravenous Continuous Hillis Range, MD      . 0.9 %  sodium chloride infusion  250 mL Intravenous Continuous Hillis Range, MD      . ceFAZolin (ANCEF) IVPB 2 g/50 mL premix  2 g Intravenous On Call Hillis Range, MD      . chlorhexidine (HIBICLENS) 4 % liquid 4 application  60 mL Topical Once Hillis Range, MD      . gentamicin (GARAMYCIN) 80 mg in sodium chloride irrigation 0.9 % 500 mL irrigation  80 mg Irrigation On Call Hillis Range, MD      . sodium chloride 0.9 % injection 3 mL  3 mL Intravenous Q12H Hillis Range, MD      . sodium chloride 0.9 % injection 3 mL  3 mL Intravenous PRN Hillis Range, MD        Social History Social History  . Marital Status: Widowed    Spouse Name: N/A    Number of Children: 2   Occupational History  . RETIRED    Social History Main Topics  . Smoking status: Former Smoker    Quit date: 07/17/1961  . Smokeless tobacco: Not on file  . Alcohol Use: No  . Drug Use: No   Review of Systems All systems reviewed and are otherwise negative except as noted above.  Physical Exam There were no vitals taken for this visit.  General: Well developed, elderly 79 year old female in no acute distress. HEENT: Normocephalic, atraumatic. EOMs intact. Sclera nonicteric. Oropharynx clear.  Neck: Supple. No JVD. Lungs:  Respirations regular and unlabored, CTA bilaterally. No wheezes, rales or rhonchi. Heart: Regular. S1, S2 present. No murmurs, rub, S3 or S4. Abdomen: Soft, non-distended.  Extremities: No clubbing, cyanosis or edema.  Psych: Normal affect. Neuro: Alert and oriented X 3. Moves all extremities spontaneously.   Diagnostics Device interrogation  see PaceArt report  Assessment and Plan 1. Tachy-brady syndrome s/p PPM - The patient has reached ERI battery status.  Risks, benefits, and alternatives to PPM pulse generator change were discussed at length  with the patient who wishes to proceed.  We will scehdule the procedure at the next available time.  2. Permanent afib Rate controlled No changes  Continue long term anticoagulation

## 2013-01-22 ENCOUNTER — Ambulatory Visit (HOSPITAL_COMMUNITY): Admission: RE | Admit: 2013-01-22 | Payer: Medicare Other | Source: Ambulatory Visit | Admitting: Internal Medicine

## 2013-01-22 ENCOUNTER — Telehealth: Payer: Self-pay | Admitting: Internal Medicine

## 2013-01-22 SURGERY — PERMANENT PACEMAKER GENERATOR CHANGE
Anesthesia: LOCAL

## 2013-01-22 NOTE — Telephone Encounter (Signed)
Spoke with daughter and will move patient to 01/29/13 Patient to be at the hospital at 12:00 Daughter aware and is going to contact the nursing home

## 2013-01-22 NOTE — Telephone Encounter (Signed)
Per daughter pt is in [facility] she dose not have number handy

## 2013-01-28 ENCOUNTER — Encounter (HOSPITAL_COMMUNITY): Payer: Self-pay | Admitting: Pharmacy Technician

## 2013-01-29 ENCOUNTER — Ambulatory Visit (HOSPITAL_COMMUNITY)
Admission: RE | Admit: 2013-01-29 | Discharge: 2013-01-29 | Disposition: A | Discharge status: Home / Self Care | Payer: Medicare Other | Source: Ambulatory Visit | Attending: Internal Medicine | Admitting: Internal Medicine

## 2013-01-29 ENCOUNTER — Encounter (HOSPITAL_COMMUNITY)
Admission: RE | Disposition: A | Discharge status: Home / Self Care | Payer: Self-pay | Source: Ambulatory Visit | Attending: Internal Medicine

## 2013-01-29 DIAGNOSIS — K219 Gastro-esophageal reflux disease without esophagitis: Secondary | ICD-10-CM | POA: Insufficient documentation

## 2013-01-29 DIAGNOSIS — F3289 Other specified depressive episodes: Secondary | ICD-10-CM | POA: Insufficient documentation

## 2013-01-29 DIAGNOSIS — I10 Essential (primary) hypertension: Secondary | ICD-10-CM | POA: Diagnosis present

## 2013-01-29 DIAGNOSIS — F329 Major depressive disorder, single episode, unspecified: Secondary | ICD-10-CM | POA: Insufficient documentation

## 2013-01-29 DIAGNOSIS — F0390 Unspecified dementia without behavioral disturbance: Secondary | ICD-10-CM | POA: Insufficient documentation

## 2013-01-29 DIAGNOSIS — F411 Generalized anxiety disorder: Secondary | ICD-10-CM | POA: Insufficient documentation

## 2013-01-29 DIAGNOSIS — I495 Sick sinus syndrome: Secondary | ICD-10-CM

## 2013-01-29 DIAGNOSIS — I4891 Unspecified atrial fibrillation: Secondary | ICD-10-CM | POA: Insufficient documentation

## 2013-01-29 DIAGNOSIS — I2510 Atherosclerotic heart disease of native coronary artery without angina pectoris: Secondary | ICD-10-CM | POA: Insufficient documentation

## 2013-01-29 DIAGNOSIS — Z45018 Encounter for adjustment and management of other part of cardiac pacemaker: Secondary | ICD-10-CM | POA: Insufficient documentation

## 2013-01-29 HISTORY — PX: PERMANENT PACEMAKER GENERATOR CHANGE: SHX6022

## 2013-01-29 LAB — BASIC METABOLIC PANEL
BUN: 12 mg/dL (ref 6–23)
GFR calc Af Amer: 72 mL/min — ABNORMAL LOW (ref >90)
GFR calc non Af Amer: 62 mL/min — ABNORMAL LOW (ref >90)
Potassium: 3.7 mEq/L (ref 3.5–5.1)

## 2013-01-29 LAB — SURGICAL PCR SCREEN: MRSA, PCR: NEGATIVE

## 2013-01-29 SURGERY — PERMANENT PACEMAKER GENERATOR CHANGE
Anesthesia: LOCAL

## 2013-01-29 MED ORDER — SODIUM CHLORIDE 0.9 % IJ SOLN: 3.0000 mL | INTRAMUSCULAR | Status: DC | PRN

## 2013-01-29 MED ORDER — LIDOCAINE HCL (PF) 1 % IJ SOLN
INTRAMUSCULAR | Status: AC
  Filled 2013-01-29: qty 60

## 2013-01-29 MED ORDER — CEFAZOLIN SODIUM-DEXTROSE 2-3 GM-% IV SOLR: 2.0000 g | INTRAVENOUS | Status: DC

## 2013-01-29 MED ORDER — SODIUM CHLORIDE 0.9 % IJ SOLN: 3.0000 mL | Freq: Two times a day (BID) | INTRAMUSCULAR | Status: DC

## 2013-01-29 MED ORDER — MUPIROCIN 2 % EX OINT
TOPICAL_OINTMENT | CUTANEOUS | Status: AC
  Administered 2013-01-29: 1
  Filled 2013-01-29: qty 22

## 2013-01-29 MED ORDER — ACETAMINOPHEN 325 MG PO TABS: 325.0000 mg | ORAL_TABLET | ORAL | Status: DC | PRN

## 2013-01-29 MED ORDER — ONDANSETRON HCL 4 MG/2ML IJ SOLN: 4.0000 mg | Freq: Four times a day (QID) | INTRAMUSCULAR | Status: DC | PRN

## 2013-01-29 MED ORDER — HYDROCODONE-ACETAMINOPHEN 5-325 MG PO TABS: 1.0000 | ORAL_TABLET | ORAL | Status: DC | PRN

## 2013-01-29 MED ORDER — CEFAZOLIN SODIUM-DEXTROSE 2-3 GM-% IV SOLR
INTRAVENOUS | Status: AC
  Filled 2013-01-29: qty 50

## 2013-01-29 MED ORDER — MUPIROCIN 2 % EX OINT: TOPICAL_OINTMENT | Freq: Two times a day (BID) | CUTANEOUS | Status: DC

## 2013-01-29 MED ORDER — HYDRALAZINE HCL 20 MG/ML IJ SOLN: 10.0000 mg | Freq: Once | INTRAMUSCULAR | Status: DC

## 2013-01-29 MED ORDER — HYDRALAZINE HCL 20 MG/ML IJ SOLN
INTRAMUSCULAR | Status: AC
  Filled 2013-01-29: qty 1

## 2013-01-29 MED ORDER — SODIUM CHLORIDE 0.9 % IV SOLN: 250.0000 mL | INTRAVENOUS | Status: DC | PRN

## 2013-01-29 MED ORDER — SODIUM CHLORIDE 0.9 % IR SOLN
80.0000 mg | Status: DC
  Filled 2013-01-29: qty 2

## 2013-01-29 NOTE — H&P (View-Only) (Signed)
 ELECTROPHYSIOLOGY OFFICE NOTE  Patient ID: Alison Dodson MRN: [id number], DOB/AGE: 07/09/1928   Date of Visit: 01/21/2013  Primary Physician: Richard Tisovec, MD Primary Cardiologist: Jordan, MD Reason for Visit: Device folllow-up  History of Present Illness Alison Dodson is an 79 year old woman with permanent and tachy-brady syndrome s/p PPM implantation by Dr. Kersey in 2009 who presents today for routine device follow-up. She is accompanied by her daughter. She denies any cardiac complaints.  She has reached ERI battery status.  She denies CP, SOB, palpitations, dizziness, near syncope or syncope. Of note, she is not on Coumadin due to frequent falls.  Past Medical History  Diagnosis Date  . Anxiety   . Coronary artery disease   . Depression   . Hypertension   . Tachy-brady syndrome   . Permanent atrial fibrillation   . HX: anticoagulation   . Glaucoma(365)   . Dizziness   . SSS (sick sinus syndrome)   . GERD (gastroesophageal reflux disease)   . Fall   . Osteoarthritis   . Dementia      Past Surgical History  Procedure Laterality Date  . Insert / replace / remove pacemaker  01/24/2008    DDD PACEMAKER IMPLANT  . Cholecystectomy    . Gastric bypass    . Breast reduction surgery    . Us echocardiography  08/10/2007    EF 60%  . Us echocardiography  01/12/2007    EF 70%  . Cardiovascular stress test  02/15/2007  . Total knee arthroplasty      right  . Pacemaker insertion       Allergies/Intolerances Allergies  Allergen Reactions  . Codeine Other (See Comments)    unknown  . Iodine Other (See Comments)    unknown    Current Home Medications Current Outpatient Prescriptions  Medication Sig Dispense Refill  . acetaminophen (TYLENOL) 500 MG tablet Take 500 mg by mouth every 4 (four) hours as needed. fever      . alum & mag hydroxide-simeth (MI-ACID) 200-200-20 MG/5ML suspension Take 30 mLs by mouth as needed. Standing order for heartburn /indigestion.Not to exceed  4 doses in 24 hours.      . amiodarone (PACERONE) 200 MG tablet Take 200 mg by mouth daily.       . aspirin 325 MG tablet Take 325 mg by mouth daily.      . atenolol (TENORMIN) 50 MG tablet Take 50 mg by mouth daily.        . azelastine (OPTIVAR) 0.05 % ophthalmic solution Place 1 drop into both eyes 2 (two) times daily.       . cholecalciferol (VITAMIN D) 1000 UNITS tablet Take 1,000 Units by mouth daily.      . Cranberry-Vitamin C-Vitamin E (CRANBERRY PLUS VITAMIN C PO) Take 2 capsules by mouth at bedtime.      . docusate sodium (STOOL SOFTENER) 100 MG capsule Take 100 mg by mouth 2 (two) times daily.       . donepezil (ARICEPT) 10 MG tablet Take 10 mg by mouth daily.       . guaiFENesin-codeine (IOPHEN C-NR) 100-10 MG/5ML syrup Take 10 mLs by mouth 4 (four) times daily as needed. Standing order for cough.      . HYDROcodone-acetaminophen (NORCO/VICODIN) 5-325 MG per tablet Take 1 tablet by mouth every 6 (six) hours as needed for pain.  10 tablet  0  . latanoprost (XALATAN) 0.005 % ophthalmic solution Place 1 drop into both eyes daily.       .   lisinopril (PRINIVIL,ZESTRIL) 10 MG tablet Take 20 mg by mouth daily.       . magnesium hydroxide (MILK OF MAGNESIA) 400 MG/5ML suspension Take 30 mLs by mouth at bedtime as needed. Standing order for constipation.      . mirtazapine (REMERON) 7.5 MG tablet Take 7.5 mg by mouth at bedtime.      . omeprazole (PRILOSEC) 20 MG capsule Take 20 mg by mouth every morning.       . Polyethyl Glycol-Propyl Glycol (SYSTANE OP) Place 2 drops into both eyes 4 (four) times daily as needed. Itchy eyes      . potassium chloride (K-DUR,KLOR-CON) 10 MEQ tablet Take 10 mEq by mouth every morning.       . QUEtiapine (SEROQUEL) 25 MG tablet Take 25 mg by mouth 2 (two) times daily.      . sertraline (ZOLOFT) 50 MG tablet Take 50 mg by mouth daily.       No current facility-administered medications for this visit.   Facility-Administered Medications Ordered in Other Visits   Medication Dose Route Frequency Provider Last Rate Last Dose  . 0.45 % sodium chloride infusion   Intravenous Continuous James Allred, MD      . 0.9 %  sodium chloride infusion  250 mL Intravenous Continuous James Allred, MD      . ceFAZolin (ANCEF) IVPB 2 g/50 mL premix  2 g Intravenous On Call James Allred, MD      . chlorhexidine (HIBICLENS) 4 % liquid 4 application  60 mL Topical Once James Allred, MD      . gentamicin (GARAMYCIN) 80 mg in sodium chloride irrigation 0.9 % 500 mL irrigation  80 mg Irrigation On Call James Allred, MD      . sodium chloride 0.9 % injection 3 mL  3 mL Intravenous Q12H James Allred, MD      . sodium chloride 0.9 % injection 3 mL  3 mL Intravenous PRN James Allred, MD        Social History Social History  . Marital Status: Widowed    Spouse Name: N/A    Number of Children: 2   Occupational History  . RETIRED    Social History Main Topics  . Smoking status: Former Smoker    Quit date: 07/17/1961  . Smokeless tobacco: Not on file  . Alcohol Use: No  . Drug Use: No   Review of Systems All systems reviewed and are otherwise negative except as noted above.  Physical Exam There were no vitals taken for this visit.  General: Well developed, elderly 79 year old female in no acute distress. HEENT: Normocephalic, atraumatic. EOMs intact. Sclera nonicteric. Oropharynx clear.  Neck: Supple. No JVD. Lungs:  Respirations regular and unlabored, CTA bilaterally. No wheezes, rales or rhonchi. Heart: Regular. S1, S2 present. No murmurs, rub, S3 or S4. Abdomen: Soft, non-distended.  Extremities: No clubbing, cyanosis or edema.  Psych: Normal affect. Neuro: Alert and oriented X 3. Moves all extremities spontaneously.   Diagnostics Device interrogation  see PaceArt report  Assessment and Plan 1. Tachy-brady syndrome s/p PPM - The patient has reached ERI battery status.  Risks, benefits, and alternatives to PPM pulse generator change were discussed at length  with the patient who wishes to proceed.  We will scehdule the procedure at the next available time.  2. Permanent afib Rate controlled No changes  Continue long term anticoagulation   

## 2013-01-29 NOTE — Op Note (Signed)
SURGEON:  Hillis Range, MD     PREPROCEDURE DIAGNOSES:   1. Symptomatic bradycardia, tachycardia/ bradycardia syndrome  2. Permanent Atrial fibrillation.     POSTPROCEDURE DIAGNOSES:   1. Symptomatic bradycardia, tachycardia/ bradycardia syndrome  2. Permanent Atrial fibrillation.   PROCEDURES:   1. Pacemaker pulse generator replacement.   2. Skin pocket revision.     INTRODUCTION:  Alison Dodson is a 79 y.o. female with a history of sick sinus syndrome. She has done well since his pacemaker was implanted.  Interrogation reveals permanent afib.  She has recently reached ERI battery status.  She presents today for pacemaker pulse generator replacement.       DESCRIPTION OF THE PROCEDURE:  Informed written consent was obtained, and the patient was brought to the electrophysiology lab in the fasting state.  The patient's pacemaker was interrogated today and found to be at elective replacement indicator battery status.  The patient required no sedation for the procedure today.  The patient's left chest was prepped and draped in the usual sterile fashion by the EP lab staff.  The skin overlying the existing pacemaker was infiltrated with lidocaine for local analgesia.  A 4-cm incision was made over the pacemaker pocket.  Using a combination of sharp and blunt dissection, the pacemaker was exposed and removed from the body.  The device was disconnected from the leads. A single silk suture was identified and removed which had secured the device to the pectoralis fascia.  There was no foreign matter or debris within the pocket.  The atrial lead was confirmed to be a Medtronic model [id number] (serial number [id number]) lead implanted on 01/24/2008.  This lead was capped and placed into the pocket today due to permanent atrial fibrillation.  The right ventricular lead was confirmed to be a Medtronic model [id number] (serial number [id number]) lead implanted on the same date as the atrial lead (above).  Both leads were  examined and their integrity was confirmed to be intact.  Right ventricular lead R-waves measured 7 mV with impedance of 403 ohms and a threshold of 1.0 V at 0.4 msec.  The RV lead was connected to a Medtronic [place] model [id number] (serial number [id number]) pacemaker.  The pocket was revised to accommodate this new device.  Electrocautery was required to assure hemostasis.  The pocket was irrigated with copious gentamicin solution. The pacemaker was then placed into the pocket.  The pocket was then closed in 2 layers with 2-0 Vicryl suture over the subcutaneous and subcuticular layers.  Steri-Strips and a sterile dressing were then applied.  There were no early apparent complications.     CONCLUSIONS:   1. Successful pacemaker pulse generator replacement for elective replacement indicator battery status   2. No early apparent complications.     Hillis Range, MD 01/29/2013 5:21 PM

## 2013-01-29 NOTE — Interval H&P Note (Signed)
History and Physical Interval Note:  01/29/2013 2:41 PM  Alison Dodson  has presented today for surgery, with the diagnosis of PPM CHANGE  The various methods of treatment have been discussed with the patient and family. After consideration of risks, benefits and other options for treatment, the patient has consented to  Procedure(s): PERMANENT PACEMAKER GENERATOR CHANGE (N/A) as a surgical intervention .  The patient's history has been reviewed, patient examined, no change in status, stable for surgery.  I have reviewed the patient's chart and labs.  Questions were answered to the patient's satisfaction.     Hillis Range

## 2013-02-01 ENCOUNTER — Encounter: Payer: Medicare Other | Admitting: Internal Medicine

## 2013-02-11 ENCOUNTER — Ambulatory Visit (INDEPENDENT_AMBULATORY_CARE_PROVIDER_SITE_OTHER): Payer: Medicare Other | Admitting: *Deleted

## 2013-02-11 ENCOUNTER — Encounter: Payer: Self-pay | Admitting: Internal Medicine

## 2013-02-11 ENCOUNTER — Other Ambulatory Visit: Payer: Self-pay | Admitting: Internal Medicine

## 2013-02-11 DIAGNOSIS — I4891 Unspecified atrial fibrillation: Secondary | ICD-10-CM

## 2013-02-11 LAB — PACEMAKER DEVICE OBSERVATION
BRDY-0002RV: 60 {beats}/min
BRDY-0004RV: 110 {beats}/min
RV LEAD AMPLITUDE: 5.6 mv

## 2013-02-11 NOTE — Progress Notes (Signed)
Wound check-PPM 

## 2013-02-17 ENCOUNTER — Emergency Department (HOSPITAL_COMMUNITY)
Admission: EM | Admit: 2013-02-17 | Discharge: 2013-02-17 | Disposition: A | Discharge status: Home / Self Care | Payer: Medicare Other | Attending: Emergency Medicine | Admitting: Emergency Medicine

## 2013-02-17 ENCOUNTER — Encounter (HOSPITAL_COMMUNITY): Payer: Self-pay | Admitting: Emergency Medicine

## 2013-02-17 ENCOUNTER — Emergency Department (HOSPITAL_COMMUNITY): Payer: Medicare Other

## 2013-02-17 ENCOUNTER — Other Ambulatory Visit: Payer: Self-pay

## 2013-02-17 DIAGNOSIS — Z043 Encounter for examination and observation following other accident: Secondary | ICD-10-CM | POA: Insufficient documentation

## 2013-02-17 DIAGNOSIS — Z79899 Other long term (current) drug therapy: Secondary | ICD-10-CM | POA: Insufficient documentation

## 2013-02-17 DIAGNOSIS — F0280 Dementia in other diseases classified elsewhere without behavioral disturbance: Secondary | ICD-10-CM | POA: Insufficient documentation

## 2013-02-17 DIAGNOSIS — I2510 Atherosclerotic heart disease of native coronary artery without angina pectoris: Secondary | ICD-10-CM | POA: Insufficient documentation

## 2013-02-17 DIAGNOSIS — Z7982 Long term (current) use of aspirin: Secondary | ICD-10-CM | POA: Insufficient documentation

## 2013-02-17 DIAGNOSIS — Z8679 Personal history of other diseases of the circulatory system: Secondary | ICD-10-CM | POA: Insufficient documentation

## 2013-02-17 DIAGNOSIS — Y9210 Unspecified residential institution as the place of occurrence of the external cause: Secondary | ICD-10-CM | POA: Insufficient documentation

## 2013-02-17 DIAGNOSIS — Y939 Activity, unspecified: Secondary | ICD-10-CM | POA: Insufficient documentation

## 2013-02-17 DIAGNOSIS — Z87828 Personal history of other (healed) physical injury and trauma: Secondary | ICD-10-CM | POA: Insufficient documentation

## 2013-02-17 DIAGNOSIS — Z9181 History of falling: Secondary | ICD-10-CM | POA: Insufficient documentation

## 2013-02-17 DIAGNOSIS — Z8659 Personal history of other mental and behavioral disorders: Secondary | ICD-10-CM | POA: Insufficient documentation

## 2013-02-17 DIAGNOSIS — W050XXA Fall from non-moving wheelchair, initial encounter: Secondary | ICD-10-CM | POA: Insufficient documentation

## 2013-02-17 DIAGNOSIS — G309 Alzheimer's disease, unspecified: Secondary | ICD-10-CM | POA: Insufficient documentation

## 2013-02-17 DIAGNOSIS — Z8719 Personal history of other diseases of the digestive system: Secondary | ICD-10-CM | POA: Insufficient documentation

## 2013-02-17 DIAGNOSIS — Z7901 Long term (current) use of anticoagulants: Secondary | ICD-10-CM | POA: Insufficient documentation

## 2013-02-17 DIAGNOSIS — W19XXXA Unspecified fall, initial encounter: Secondary | ICD-10-CM

## 2013-02-17 DIAGNOSIS — I10 Essential (primary) hypertension: Secondary | ICD-10-CM | POA: Insufficient documentation

## 2013-02-17 DIAGNOSIS — Z8669 Personal history of other diseases of the nervous system and sense organs: Secondary | ICD-10-CM | POA: Insufficient documentation

## 2013-02-17 DIAGNOSIS — Z87891 Personal history of nicotine dependence: Secondary | ICD-10-CM | POA: Insufficient documentation

## 2013-02-17 DIAGNOSIS — M1990 Unspecified osteoarthritis, unspecified site: Secondary | ICD-10-CM | POA: Insufficient documentation

## 2013-02-17 LAB — CBC WITH DIFFERENTIAL/PLATELET
Eosinophils Absolute: 0.2 10*3/uL (ref 0.0–0.7)
Eosinophils Relative: 3 % (ref 0–5)
HCT: 38.9 % (ref 36.0–46.0)
Hemoglobin: 13.1 g/dL (ref 12.0–15.0)
Lymphs Abs: 1.7 10*3/uL (ref 0.7–4.0)
MCH: 32.3 pg (ref 26.0–34.0)
MCV: 95.8 fL (ref 78.0–100.0)
Monocytes Absolute: 0.6 10*3/uL (ref 0.1–1.0)
Monocytes Relative: 9 % (ref 3–12)
RBC: 4.06 MIL/uL (ref 3.87–5.11)

## 2013-02-17 LAB — BASIC METABOLIC PANEL
BUN: 17 mg/dL (ref 6–23)
Calcium: 9.3 mg/dL (ref 8.4–10.5)
Creatinine, Ser: 0.94 mg/dL (ref 0.50–1.10)
GFR calc non Af Amer: 55 mL/min — ABNORMAL LOW (ref >90)
Glucose, Bld: 83 mg/dL (ref 70–99)
Potassium: 3.5 mEq/L (ref 3.5–5.1)

## 2013-02-17 LAB — URINALYSIS, ROUTINE W REFLEX MICROSCOPIC
Bilirubin Urine: NEGATIVE
Hgb urine dipstick: NEGATIVE
Specific Gravity, Urine: 1.012 (ref 1.005–1.030)
Urobilinogen, UA: 1 mg/dL (ref 0.0–1.0)

## 2013-02-17 LAB — URINE MICROSCOPIC-ADD ON

## 2013-02-17 NOTE — ED Provider Notes (Signed)
History     CSN: [id number]  Arrival date & time 02/17/13  1811   First MD Initiated Contact with Patient 02/17/13 1826      Chief Complaint  Patient presents with  . Fall    (Consider location/radiation/quality/duration/timing/severity/associated sxs/prior treatment) HPI Comments: This is an 78 year old female, PMH remarkable for dementia, CAD, HTN, tachy-brady syndrome, and a-fib, who presents to the ED with a chief complaint of fall. Pt coming from [facility]. She is confused with baseline dementia. She is unable to contribute to history. Level 5 caveat.  Per EMS patient fell off her wheelchair.  No witnessed LOC.  The history is provided by the patient. No language interpreter was used.    Past Medical History  Diagnosis Date  . Anxiety   . Coronary artery disease   . Depression   . Hypertension   . Tachy-brady syndrome   . Permanent atrial fibrillation   . HX: anticoagulation   . Glaucoma(365)   . Dizziness   . SSS (sick sinus syndrome)   . GERD (gastroesophageal reflux disease)   . Fall   . Osteoarthritis   . Dementia     Past Surgical History  Procedure Laterality Date  . Insert / replace / remove pacemaker  01/24/2008    DDD PACEMAKER IMPLANT  . Cholecystectomy    . Gastric bypass    . Breast reduction surgery    . US echocardiography  08/10/2007    EF 60%  . US echocardiography  01/12/2007    EF 70%  . Cardiovascular stress test  02/15/2007  . Total knee arthroplasty      right  . Pacemaker insertion      Family History  Problem Relation Age of Onset  . Kidney failure Mother   . Heart attack Father   . Heart attack Sister   . Heart attack Brother     History  Substance Use Topics  . Smoking status: Former Smoker    Quit date: 07/17/1961  . Smokeless tobacco: Not on file  . Alcohol Use: No    OB History   Grav Para Term Preterm Abortions TAB SAB Ect Mult Living                  Review of Systems  All other systems reviewed and  are negative.    Allergies  Codeine and Iodine  Home Medications   Current Outpatient Rx  Name  Route  Sig  Dispense  Refill  . acetaminophen (TYLENOL) 500 MG tablet   Oral   Take 500 mg by mouth every 4 (four) hours as needed for fever.          Marland Kitchen alum & mag hydroxide-simeth (MI-ACID) 200-200-20 MG/5ML suspension   Oral   Take 30 mLs by mouth as needed. Standing order for heartburn /indigestion.Not to exceed 4 doses in 24 hours.         Marland Kitchen aspirin 325 MG tablet   Oral   Take 325 mg by mouth daily.         Marland Kitchen atenolol (TENORMIN) 50 MG tablet   Oral   Take 50 mg by mouth daily.           Marland Kitchen azelastine (OPTIVAR) 0.05 % ophthalmic solution   Both Eyes   Place 1 drop into both eyes every 12 (twelve) hours.          . cholecalciferol (VITAMIN D) 1000 UNITS tablet   Oral   Take 1,000  Units by mouth daily.         . Cranberry-Vitamin C-Vitamin E (CRANBERRY PLUS VITAMIN C PO)   Oral   Take 2 capsules by mouth at bedtime.         . docusate sodium (STOOL SOFTENER) 100 MG capsule   Oral   Take 100 mg by mouth 2 (two) times daily.          Marland Kitchen donepezil (ARICEPT) 10 MG tablet   Oral   Take 10 mg by mouth daily.          Marland Kitchen guaiFENesin (ROBITUSSIN) 100 MG/5ML liquid   Oral   Take 200 mg by mouth every 6 (six) hours as needed for cough.         . hydrocortisone (ANUSOL-HC) 25 MG suppository   Rectal   Place 25 mg rectally every 12 (twelve) hours. For 10 days - stop date 02/02/13         . latanoprost (XALATAN) 0.005 % ophthalmic solution   Both Eyes   Place 1 drop into both eyes daily.          Marland Kitchen lisinopril (PRINIVIL,ZESTRIL) 10 MG tablet   Oral   Take 10 mg by mouth daily.          Marland Kitchen loperamide (IMODIUM A-D) 2 MG tablet   Oral   Take 2 mg by mouth 4 (four) times daily as needed for diarrhea or loose stools.         . magnesium hydroxide (MILK OF MAGNESIA) 400 MG/5ML suspension   Oral   Take 30 mLs by mouth at bedtime as needed for  constipation.          . mirtazapine (REMERON) 7.5 MG tablet   Oral   Take 7.5 mg by mouth at bedtime.         Marland Kitchen omeprazole (PRILOSEC) 20 MG capsule   Oral   Take 20 mg by mouth every morning.          Bertram Gala Glycol-Propyl Glycol (SYSTANE OP)   Both Eyes   Place 2 drops into both eyes 4 (four) times daily as needed (fir utcgubg).          . polyethylene glycol powder (GLYCOLAX/MIRALAX) powder   Oral   Take 17 g by mouth every other day. Dissolve in 8 oz of beverage of choice and drink         . potassium chloride (K-DUR,KLOR-CON) 10 MEQ tablet   Oral   Take 10 mEq by mouth every morning.            BP 169/109  Pulse 72  Temp(Src) 97.8 F (36.6 C) (Oral)  Resp 12  SpO2 96%  Physical Exam  Nursing note and vitals reviewed. Constitutional: She is oriented to person, place, and time. She appears well-developed and well-nourished.  Patient in C-collar  HENT:  Head: Normocephalic and atraumatic.  Eyes: Conjunctivae and EOM are normal. Pupils are equal, round, and reactive to light.  Neck: Normal range of motion. Neck supple.  Cardiovascular: Normal rate and regular rhythm.  Exam reveals no gallop and no friction rub.   No murmur heard. Pulmonary/Chest: Effort normal and breath sounds normal. No respiratory distress. She has no wheezes. She has no rales. She exhibits no tenderness.  Abdominal: Soft. Bowel sounds are normal. She exhibits no distension and no mass. There is no tenderness. There is no rebound and no guarding.  Musculoskeletal: Normal range of motion. She exhibits no edema and no  tenderness.  Neurological: She is alert and oriented to person, place, and time.  Skin: Skin is warm and dry.  No abrasions, skin tears, or lacerations seen on my exam.  Psychiatric: She has a normal mood and affect. Her behavior is normal.  Oriented to place, but not to time or date, and demented at baseline    ED Course  Procedures (including critical care  time)  Results for orders placed during the hospital encounter of 02/17/13  CBC WITH DIFFERENTIAL      Result Value Range   WBC 6.7  4.0 - 10.5 K/uL   RBC 4.06  3.87 - 5.11 MIL/uL   Hemoglobin 13.1  12.0 - 15.0 g/dL   HCT 40.9  81.1 - 91.4 %   MCV 95.8  78.0 - 100.0 fL   MCH 32.3  26.0 - 34.0 pg   MCHC 33.7  30.0 - 36.0 g/dL   RDW 78.2  95.6 - 21.3 %   Platelets 222  150 - 400 K/uL   Neutrophils Relative 62  43 - 77 %   Neutro Abs 4.2  1.7 - 7.7 K/uL   Lymphocytes Relative 25  12 - 46 %   Lymphs Abs 1.7  0.7 - 4.0 K/uL   Monocytes Relative 9  3 - 12 %   Monocytes Absolute 0.6  0.1 - 1.0 K/uL   Eosinophils Relative 3  0 - 5 %   Eosinophils Absolute 0.2  0.0 - 0.7 K/uL   Basophils Relative 0  0 - 1 %   Basophils Absolute 0.0  0.0 - 0.1 K/uL  BASIC METABOLIC PANEL      Result Value Range   Sodium 142  135 - 145 mEq/L   Potassium 3.5  3.5 - 5.1 mEq/L   Chloride 105  96 - 112 mEq/L   CO2 29  19 - 32 mEq/L   Glucose, Bld 83  70 - 99 mg/dL   BUN 17  6 - 23 mg/dL   Creatinine, Ser 0.86  0.50 - 1.10 mg/dL   Calcium 9.3  8.4 - 57.8 mg/dL   GFR calc non Af Amer 55 (*) >90 mL/min   GFR calc Af Amer 63 (*) >90 mL/min  URINALYSIS, ROUTINE W REFLEX MICROSCOPIC      Result Value Range   Color, Urine YELLOW  YELLOW   APPearance CLOUDY (*) CLEAR   Specific Gravity, Urine 1.012  1.005 - 1.030   pH 5.5  5.0 - 8.0   Glucose, UA NEGATIVE  NEGATIVE mg/dL   Hgb urine dipstick NEGATIVE  NEGATIVE   Bilirubin Urine NEGATIVE  NEGATIVE   Ketones, ur NEGATIVE  NEGATIVE mg/dL   Protein, ur NEGATIVE  NEGATIVE mg/dL   Urobilinogen, UA 1.0  0.0 - 1.0 mg/dL   Nitrite NEGATIVE  NEGATIVE   Leukocytes, UA LARGE (*) NEGATIVE  URINE MICROSCOPIC-ADD ON      Result Value Range   Squamous Epithelial / LPF RARE  RARE   WBC, UA 7-10  <3 WBC/hpf   Bacteria, UA RARE  RARE   Results for orders placed during the hospital encounter of 02/17/13  CBC WITH DIFFERENTIAL      Result Value Range   WBC 6.7  4.0 -  10.5 K/uL   RBC 4.06  3.87 - 5.11 MIL/uL   Hemoglobin 13.1  12.0 - 15.0 g/dL   HCT 46.9  62.9 - 52.8 %   MCV 95.8  78.0 - 100.0 fL   MCH 32.3  26.0 -  34.0 pg   MCHC 33.7  30.0 - 36.0 g/dL   RDW 16.1  09.6 - 04.5 %   Platelets 222  150 - 400 K/uL   Neutrophils Relative 62  43 - 77 %   Neutro Abs 4.2  1.7 - 7.7 K/uL   Lymphocytes Relative 25  12 - 46 %   Lymphs Abs 1.7  0.7 - 4.0 K/uL   Monocytes Relative 9  3 - 12 %   Monocytes Absolute 0.6  0.1 - 1.0 K/uL   Eosinophils Relative 3  0 - 5 %   Eosinophils Absolute 0.2  0.0 - 0.7 K/uL   Basophils Relative 0  0 - 1 %   Basophils Absolute 0.0  0.0 - 0.1 K/uL  BASIC METABOLIC PANEL      Result Value Range   Sodium 142  135 - 145 mEq/L   Potassium 3.5  3.5 - 5.1 mEq/L   Chloride 105  96 - 112 mEq/L   CO2 29  19 - 32 mEq/L   Glucose, Bld 83  70 - 99 mg/dL   BUN 17  6 - 23 mg/dL   Creatinine, Ser 4.09  0.50 - 1.10 mg/dL   Calcium 9.3  8.4 - 81.1 mg/dL   GFR calc non Af Amer 55 (*) >90 mL/min   GFR calc Af Amer 63 (*) >90 mL/min  URINALYSIS, ROUTINE W REFLEX MICROSCOPIC      Result Value Range   Color, Urine YELLOW  YELLOW   APPearance CLOUDY (*) CLEAR   Specific Gravity, Urine 1.012  1.005 - 1.030   pH 5.5  5.0 - 8.0   Glucose, UA NEGATIVE  NEGATIVE mg/dL   Hgb urine dipstick NEGATIVE  NEGATIVE   Bilirubin Urine NEGATIVE  NEGATIVE   Ketones, ur NEGATIVE  NEGATIVE mg/dL   Protein, ur NEGATIVE  NEGATIVE mg/dL   Urobilinogen, UA 1.0  0.0 - 1.0 mg/dL   Nitrite NEGATIVE  NEGATIVE   Leukocytes, UA LARGE (*) NEGATIVE  URINE MICROSCOPIC-ADD ON      Result Value Range   Squamous Epithelial / LPF RARE  RARE   WBC, UA 7-10  <3 WBC/hpf   Bacteria, UA RARE  RARE   Ct Head Wo Contrast  02/17/2013  *RADIOLOGY REPORT*  Clinical Data:  Fall  CT HEAD WITHOUT CONTRAST CT CERVICAL SPINE WITHOUT CONTRAST  Technique:  Multidetector CT imaging of the head and cervical spine was performed following the standard protocol without intravenous  contrast.  Multiplanar CT image reconstructions of the cervical spine were also generated.  Comparison:  10/09/2029  CT HEAD  Findings: Chronic ischemic changes.  Global atrophy.  No mass effect, midline shift, or acute intracranial hemorrhage. Minimal fluid in the right mastoid air cells.  Left mastoid air cells are clear.  Minimal mucous material in the left mid ethmoid air cells. Paranasal sinuses are otherwise clear.  No cranial fracture.  IMPRESSION: No acute intracranial pathology other than minimal fluid in the right mastoid air cells.  CT CERVICAL SPINE  Findings: No acute fracture.  No dislocation.  Advanced  multilevel facet arthropathy is seen throughout the cervical spine.  This causes varying degrees of foraminal narrowing.  There is also degree of spinal stenosis at C5-6 secondary to anterolisthesis and posterior disc osteophytes.  No obvious soft tissue injury.  IMPRESSION: No acute bony injury.  Degenerative changes.   Original Report Authenticated By: Jolaine Click, M.D.    Ct Cervical Spine Wo Contrast  02/17/2013  *RADIOLOGY  REPORT*  Clinical Data:  Fall  CT HEAD WITHOUT CONTRAST CT CERVICAL SPINE WITHOUT CONTRAST  Technique:  Multidetector CT imaging of the head and cervical spine was performed following the standard protocol without intravenous contrast.  Multiplanar CT image reconstructions of the cervical spine were also generated.  Comparison:  10/09/2029  CT HEAD  Findings: Chronic ischemic changes.  Global atrophy.  No mass effect, midline shift, or acute intracranial hemorrhage. Minimal fluid in the right mastoid air cells.  Left mastoid air cells are clear.  Minimal mucous material in the left mid ethmoid air cells. Paranasal sinuses are otherwise clear.  No cranial fracture.  IMPRESSION: No acute intracranial pathology other than minimal fluid in the right mastoid air cells.  CT CERVICAL SPINE  Findings: No acute fracture.  No dislocation.  Advanced  multilevel facet arthropathy is seen  throughout the cervical spine.  This causes varying degrees of foraminal narrowing.  There is also degree of spinal stenosis at C5-6 secondary to anterolisthesis and posterior disc osteophytes.  No obvious soft tissue injury.  IMPRESSION: No acute bony injury.  Degenerative changes.   Original Report Authenticated By: Jolaine Click, M.D.        1. Fall, initial encounter   2. Alzheimer's dementia       MDM  T66-year-old demented patient. Reportedly fell off her wheelchair. No observed loss of consciousness. Dementia at baseline.  7:33 PM Patient discussed with Dr. Preston Fleeting, who agrees with the plan. We'll proceed with imaging of the head and neck. Will check his collapse, and urinalysis.  7:45 PM Patient seen by Dr. Preston Fleeting. C-collar removed by Dr. Preston Fleeting, who tells me that the patient is stable and ready for discharge.  Filed Vitals:   02/17/13 1828  BP: 169/109  Pulse: 72  Temp: 97.8 F (36.6 C)  Resp: [id number]         Roxy Horseman, PA-C 02/17/13 1945  Roxy Horseman, PA-C 02/17/13 1955

## 2013-02-17 NOTE — ED Notes (Signed)
[id number]<YN> Expected date:<BR> Expected time:<BR> Means of arrival:<BR> Comments:<BR> 79 y/o F fall

## 2013-02-17 NOTE — ED Provider Notes (Signed)
79 year old female who had a fall from wheelchair at her nursing home. There was no loss of consciousness but she had complained of pain in the left side of her neck. On exam, she is awake and alert and oriented to person but not place or time. There is no external signs of trauma. CT of head and cervical spine showed no acute injury. She will be returned to her nursing home.  Results for orders placed during the hospital encounter of 02/17/13  CBC WITH DIFFERENTIAL      Result Value Range   WBC 6.7  4.0 - 10.5 K/uL   RBC 4.06  3.87 - 5.11 MIL/uL   Hemoglobin 13.1  12.0 - 15.0 g/dL   HCT 19.1  47.8 - 29.5 %   MCV 95.8  78.0 - 100.0 fL   MCH 32.3  26.0 - 34.0 pg   MCHC 33.7  30.0 - 36.0 g/dL   RDW 62.1  30.8 - 65.7 %   Platelets 222  150 - 400 K/uL   Neutrophils Relative 62  43 - 77 %   Neutro Abs 4.2  1.7 - 7.7 K/uL   Lymphocytes Relative 25  12 - 46 %   Lymphs Abs 1.7  0.7 - 4.0 K/uL   Monocytes Relative 9  3 - 12 %   Monocytes Absolute 0.6  0.1 - 1.0 K/uL   Eosinophils Relative 3  0 - 5 %   Eosinophils Absolute 0.2  0.0 - 0.7 K/uL   Basophils Relative 0  0 - 1 %   Basophils Absolute 0.0  0.0 - 0.1 K/uL  BASIC METABOLIC PANEL      Result Value Range   Sodium 142  135 - 145 mEq/L   Potassium 3.5  3.5 - 5.1 mEq/L   Chloride 105  96 - 112 mEq/L   CO2 29  19 - 32 mEq/L   Glucose, Bld 83  70 - 99 mg/dL   BUN 17  6 - 23 mg/dL   Creatinine, Ser 8.46  0.50 - 1.10 mg/dL   Calcium 9.3  8.4 - 96.2 mg/dL   GFR calc non Af Amer 55 (*) >90 mL/min   GFR calc Af Amer 63 (*) >90 mL/min  URINALYSIS, ROUTINE W REFLEX MICROSCOPIC      Result Value Range   Color, Urine YELLOW  YELLOW   APPearance CLOUDY (*) CLEAR   Specific Gravity, Urine 1.012  1.005 - 1.030   pH 5.5  5.0 - 8.0   Glucose, UA NEGATIVE  NEGATIVE mg/dL   Hgb urine dipstick NEGATIVE  NEGATIVE   Bilirubin Urine NEGATIVE  NEGATIVE   Ketones, ur NEGATIVE  NEGATIVE mg/dL   Protein, ur NEGATIVE  NEGATIVE mg/dL   Urobilinogen, UA  1.0  0.0 - 1.0 mg/dL   Nitrite NEGATIVE  NEGATIVE   Leukocytes, UA LARGE (*) NEGATIVE  URINE MICROSCOPIC-ADD ON      Result Value Range   Squamous Epithelial / LPF RARE  RARE   WBC, UA 7-10  <3 WBC/hpf   Bacteria, UA RARE  RARE   Ct Head Wo Contrast  02/17/2013  *RADIOLOGY REPORT*  Clinical Data:  Fall  CT HEAD WITHOUT CONTRAST CT CERVICAL SPINE WITHOUT CONTRAST  Technique:  Multidetector CT imaging of the head and cervical spine was performed following the standard protocol without intravenous contrast.  Multiplanar CT image reconstructions of the cervical spine were also generated.  Comparison:  10/09/2029  CT HEAD  Findings: Chronic ischemic changes.  Global  atrophy.  No mass effect, midline shift, or acute intracranial hemorrhage. Minimal fluid in the right mastoid air cells.  Left mastoid air cells are clear.  Minimal mucous material in the left mid ethmoid air cells. Paranasal sinuses are otherwise clear.  No cranial fracture.  IMPRESSION: No acute intracranial pathology other than minimal fluid in the right mastoid air cells.  CT CERVICAL SPINE  Findings: No acute fracture.  No dislocation.  Advanced  multilevel facet arthropathy is seen throughout the cervical spine.  This causes varying degrees of foraminal narrowing.  There is also degree of spinal stenosis at C5-6 secondary to anterolisthesis and posterior disc osteophytes.  No obvious soft tissue injury.  IMPRESSION: No acute bony injury.  Degenerative changes.   Original Report Authenticated By: Jolaine Click, M.D.    Ct Cervical Spine Wo Contrast  02/17/2013  *RADIOLOGY REPORT*  Clinical Data:  Fall  CT HEAD WITHOUT CONTRAST CT CERVICAL SPINE WITHOUT CONTRAST  Technique:  Multidetector CT imaging of the head and cervical spine was performed following the standard protocol without intravenous contrast.  Multiplanar CT image reconstructions of the cervical spine were also generated.  Comparison:  10/09/2029  CT HEAD  Findings: Chronic ischemic  changes.  Global atrophy.  No mass effect, midline shift, or acute intracranial hemorrhage. Minimal fluid in the right mastoid air cells.  Left mastoid air cells are clear.  Minimal mucous material in the left mid ethmoid air cells. Paranasal sinuses are otherwise clear.  No cranial fracture.  IMPRESSION: No acute intracranial pathology other than minimal fluid in the right mastoid air cells.  CT CERVICAL SPINE  Findings: No acute fracture.  No dislocation.  Advanced  multilevel facet arthropathy is seen throughout the cervical spine.  This causes varying degrees of foraminal narrowing.  There is also degree of spinal stenosis at C5-6 secondary to anterolisthesis and posterior disc osteophytes.  No obvious soft tissue injury.  IMPRESSION: No acute bony injury.  Degenerative changes.   Original Report Authenticated By: Jolaine Click, M.D.    Images viewed by me.    Date: 02/17/2013  Rate: 76  Rhythm: atrial fibrillation and Electronic ventricular paced beats  QRS Axis: normal  Intervals: normal  ST/T Wave abnormalities: nonspecific T wave changes  Conduction Disutrbances:none  Narrative Interpretation:  Atrial fibrillation with demand ventricular pacemaker and occasional electronic ventricular paced beats, nonspecific T wave changes. When compared with ECG of 07/28/2012, atrial fibrillation with occasional demand ventricular paced beats has replaced ventricular paced rhythm  Old EKG Reviewed: changes noted  Medical screening examination/treatment/procedure(s) were conducted as a shared visit with non-physician practitioner(s) and myself.  I personally evaluated the patient during the encounter   Dione Booze, MD 02/17/13 2046

## 2013-02-17 NOTE — ED Notes (Signed)
Patient transported to CT 

## 2013-02-17 NOTE — ED Notes (Signed)
Per medic report, Pt fell off of her wheelchair. Pt denies LOC, denies nausea or vomiting, pt complains of left neck pain at this time.

## 2013-03-04 ENCOUNTER — Emergency Department (HOSPITAL_COMMUNITY): Payer: Medicare Other

## 2013-03-04 ENCOUNTER — Encounter (HOSPITAL_COMMUNITY): Payer: Self-pay | Admitting: *Deleted

## 2013-03-04 ENCOUNTER — Other Ambulatory Visit: Payer: Self-pay

## 2013-03-04 ENCOUNTER — Emergency Department (HOSPITAL_COMMUNITY)
Admission: EM | Admit: 2013-03-04 | Discharge: 2013-03-04 | Disposition: A | Discharge status: Home / Self Care | Payer: Medicare Other | Source: Home / Self Care | Attending: Emergency Medicine | Admitting: Emergency Medicine

## 2013-03-04 DIAGNOSIS — Z7982 Long term (current) use of aspirin: Secondary | ICD-10-CM | POA: Insufficient documentation

## 2013-03-04 DIAGNOSIS — W19XXXA Unspecified fall, initial encounter: Secondary | ICD-10-CM

## 2013-03-04 DIAGNOSIS — Z87891 Personal history of nicotine dependence: Secondary | ICD-10-CM | POA: Insufficient documentation

## 2013-03-04 DIAGNOSIS — S0990XA Unspecified injury of head, initial encounter: Secondary | ICD-10-CM | POA: Insufficient documentation

## 2013-03-04 DIAGNOSIS — I4891 Unspecified atrial fibrillation: Secondary | ICD-10-CM | POA: Diagnosis present

## 2013-03-04 DIAGNOSIS — Z79899 Other long term (current) drug therapy: Secondary | ICD-10-CM | POA: Insufficient documentation

## 2013-03-04 DIAGNOSIS — I2510 Atherosclerotic heart disease of native coronary artery without angina pectoris: Secondary | ICD-10-CM | POA: Insufficient documentation

## 2013-03-04 DIAGNOSIS — S43004A Unspecified dislocation of right shoulder joint, initial encounter: Secondary | ICD-10-CM

## 2013-03-04 DIAGNOSIS — S0180XA Unspecified open wound of other part of head, initial encounter: Secondary | ICD-10-CM | POA: Diagnosis present

## 2013-03-04 DIAGNOSIS — F3289 Other specified depressive episodes: Secondary | ICD-10-CM | POA: Insufficient documentation

## 2013-03-04 DIAGNOSIS — Z9181 History of falling: Secondary | ICD-10-CM | POA: Insufficient documentation

## 2013-03-04 DIAGNOSIS — F329 Major depressive disorder, single episode, unspecified: Secondary | ICD-10-CM | POA: Insufficient documentation

## 2013-03-04 DIAGNOSIS — S43006A Unspecified dislocation of unspecified shoulder joint, initial encounter: Secondary | ICD-10-CM | POA: Diagnosis present

## 2013-03-04 DIAGNOSIS — R296 Repeated falls: Secondary | ICD-10-CM | POA: Insufficient documentation

## 2013-03-04 DIAGNOSIS — F411 Generalized anxiety disorder: Secondary | ICD-10-CM | POA: Insufficient documentation

## 2013-03-04 DIAGNOSIS — K219 Gastro-esophageal reflux disease without esophagitis: Secondary | ICD-10-CM | POA: Insufficient documentation

## 2013-03-04 DIAGNOSIS — S43016A Anterior dislocation of unspecified humerus, initial encounter: Secondary | ICD-10-CM | POA: Insufficient documentation

## 2013-03-04 DIAGNOSIS — Y9210 Unspecified residential institution as the place of occurrence of the external cause: Secondary | ICD-10-CM | POA: Diagnosis present

## 2013-03-04 DIAGNOSIS — E876 Hypokalemia: Secondary | ICD-10-CM | POA: Diagnosis present

## 2013-03-04 DIAGNOSIS — R29810 Facial weakness: Secondary | ICD-10-CM | POA: Diagnosis present

## 2013-03-04 DIAGNOSIS — IMO0002 Reserved for concepts with insufficient information to code with codable children: Secondary | ICD-10-CM | POA: Insufficient documentation

## 2013-03-04 DIAGNOSIS — Z8679 Personal history of other diseases of the circulatory system: Secondary | ICD-10-CM | POA: Insufficient documentation

## 2013-03-04 DIAGNOSIS — F0390 Unspecified dementia without behavioral disturbance: Secondary | ICD-10-CM | POA: Insufficient documentation

## 2013-03-04 DIAGNOSIS — Z9884 Bariatric surgery status: Secondary | ICD-10-CM

## 2013-03-04 DIAGNOSIS — G459 Transient cerebral ischemic attack, unspecified: Principal | ICD-10-CM | POA: Diagnosis present

## 2013-03-04 DIAGNOSIS — I495 Sick sinus syndrome: Secondary | ICD-10-CM | POA: Insufficient documentation

## 2013-03-04 DIAGNOSIS — I10 Essential (primary) hypertension: Secondary | ICD-10-CM | POA: Diagnosis present

## 2013-03-04 DIAGNOSIS — Z950 Presence of cardiac pacemaker: Secondary | ICD-10-CM

## 2013-03-04 DIAGNOSIS — Z8669 Personal history of other diseases of the nervous system and sense organs: Secondary | ICD-10-CM | POA: Insufficient documentation

## 2013-03-04 DIAGNOSIS — M1990 Unspecified osteoarthritis, unspecified site: Secondary | ICD-10-CM | POA: Diagnosis present

## 2013-03-04 DIAGNOSIS — G309 Alzheimer's disease, unspecified: Secondary | ICD-10-CM | POA: Diagnosis present

## 2013-03-04 DIAGNOSIS — Z96659 Presence of unspecified artificial knee joint: Secondary | ICD-10-CM

## 2013-03-04 DIAGNOSIS — Y939 Activity, unspecified: Secondary | ICD-10-CM | POA: Insufficient documentation

## 2013-03-04 DIAGNOSIS — R471 Dysarthria and anarthria: Secondary | ICD-10-CM | POA: Diagnosis present

## 2013-03-04 DIAGNOSIS — H409 Unspecified glaucoma: Secondary | ICD-10-CM | POA: Insufficient documentation

## 2013-03-04 DIAGNOSIS — F0280 Dementia in other diseases classified elsewhere without behavioral disturbance: Secondary | ICD-10-CM | POA: Diagnosis present

## 2013-03-04 DIAGNOSIS — G8190 Hemiplegia, unspecified affecting unspecified side: Secondary | ICD-10-CM | POA: Diagnosis present

## 2013-03-04 LAB — CBC WITH DIFFERENTIAL/PLATELET
Lymphocytes Relative: 24 % (ref 12–46)
Lymphs Abs: 2.5 10*3/uL (ref 0.7–4.0)
MCV: 92.6 fL (ref 78.0–100.0)
Neutro Abs: 6.9 10*3/uL (ref 1.7–7.7)
Neutrophils Relative %: 67 % (ref 43–77)
Platelets: 255 10*3/uL (ref 150–400)
RBC: 4.43 MIL/uL (ref 3.87–5.11)
WBC: 10.4 10*3/uL (ref 4.0–10.5)

## 2013-03-04 LAB — COMPREHENSIVE METABOLIC PANEL
ALT: 7 U/L (ref 0–35)
Alkaline Phosphatase: 105 U/L (ref 39–117)
CO2: 27 mEq/L (ref 19–32)
GFR calc Af Amer: 72 mL/min — ABNORMAL LOW (ref >90)
GFR calc non Af Amer: 63 mL/min — ABNORMAL LOW (ref >90)
Glucose, Bld: 113 mg/dL — ABNORMAL HIGH (ref 70–99)
Potassium: 3 mEq/L — ABNORMAL LOW (ref 3.5–5.1)
Sodium: 146 mEq/L — ABNORMAL HIGH (ref 135–145)

## 2013-03-04 LAB — POCT I-STAT TROPONIN I: Troponin i, poc: 0.03 ng/mL (ref 0.00–0.08)

## 2013-03-04 LAB — URINALYSIS, ROUTINE W REFLEX MICROSCOPIC
Nitrite: NEGATIVE
Specific Gravity, Urine: 1.011 (ref 1.005–1.030)
Urobilinogen, UA: 1 mg/dL (ref 0.0–1.0)

## 2013-03-04 MED ORDER — FENTANYL CITRATE 0.05 MG/ML IJ SOLN: 50.0000 ug | Freq: Once | INTRAMUSCULAR | Status: AC

## 2013-03-04 MED ORDER — ONDANSETRON HCL 4 MG/2ML IJ SOLN
INTRAMUSCULAR | Status: AC
  Administered 2013-03-04: 4 mg via INTRAVENOUS
  Filled 2013-03-04: qty 2

## 2013-03-04 MED ORDER — HYDROMORPHONE HCL PF 1 MG/ML IJ SOLN: 1.0000 mg | Freq: Once | INTRAMUSCULAR | Status: AC

## 2013-03-04 MED ORDER — FENTANYL CITRATE 0.05 MG/ML IJ SOLN
INTRAMUSCULAR | Status: AC
  Administered 2013-03-04: 50 ug via INTRAVENOUS
  Filled 2013-03-04: qty 2

## 2013-03-04 MED ORDER — HYDROMORPHONE HCL PF 1 MG/ML IJ SOLN
INTRAMUSCULAR | Status: AC
  Administered 2013-03-04: 1 mg via INTRAVENOUS
  Filled 2013-03-04: qty 1

## 2013-03-04 MED ORDER — TRAMADOL HCL 50 MG PO TABS: 50.0000 mg | ORAL_TABLET | Freq: Four times a day (QID) | ORAL | Status: DC | PRN

## 2013-03-04 MED ORDER — POTASSIUM CHLORIDE CRYS ER 20 MEQ PO TBCR: 40.0000 meq | EXTENDED_RELEASE_TABLET | Freq: Once | ORAL | Status: DC

## 2013-03-04 MED ORDER — BUPIVACAINE HCL (PF) 0.5 % IJ SOLN
10.0000 mL | Freq: Once | INTRAMUSCULAR | Status: AC
  Administered 2013-03-04: 10 mL
  Filled 2013-03-04: qty 10

## 2013-03-04 MED ORDER — ONDANSETRON HCL 4 MG/2ML IJ SOLN: 4.0000 mg | Freq: Once | INTRAMUSCULAR | Status: AC

## 2013-03-04 MED ORDER — ETOMIDATE 2 MG/ML IV SOLN
20.0000 mg | Freq: Once | INTRAVENOUS | Status: AC
  Administered 2013-03-04: 10 mg via INTRAVENOUS
  Filled 2013-03-04: qty 10

## 2013-03-04 MED ORDER — POTASSIUM CHLORIDE 20 MEQ/15ML (10%) PO LIQD
40.0000 meq | Freq: Once | ORAL | Status: AC
  Administered 2013-03-04: 40 meq via ORAL
  Filled 2013-03-04: qty 30

## 2013-03-04 NOTE — ED Notes (Signed)
Called [facility] x 5 times.  No answer.  Kept getting an answering machine.  I did leave a message to get someone to callback so I could give report.   Martie Lee, Consulting civil engineer, aware.    [facility] called to come get patient.

## 2013-03-04 NOTE — ED Notes (Signed)
Patient received to C28.  Patient alert, resting quietly with unlabored respirations.   Offered toileting, patient wet bed - cleaned patient up.

## 2013-03-04 NOTE — ED Notes (Addendum)
Patient's daughter, Liliane Channel, called and asked about patient.  Went over all discharge paperwork with daughter/POA.

## 2013-03-04 NOTE — ED Notes (Signed)
Dr. Magnus Ivan, orthopedic MD at bedside.

## 2013-03-04 NOTE — ED Notes (Signed)
Report received, assumed care.  

## 2013-03-04 NOTE — ED Provider Notes (Signed)
History     CSN: [id number]  Arrival date & time 03/04/13  1191   First MD Initiated Contact with Patient 03/04/13 [phone]      Chief Complaint  Patient presents with  . Shoulder Injury    right  . Arm Injury    right elbow  . Head Injury    hematoma on forehead  . Head Laceration    (Consider location/radiation/quality/duration/timing/severity/associated sxs/prior treatment) The history is provided by the patient and the EMS personnel. The history is limited by the condition of the patient.  Alison Dodson is a 79 y.o. female history of dementia, hypertension, CAD,  Frequent falls here presenting with fall. Unwitnessed fall at an nursing home today. The floor about 5:30 AM this morning. Patient did not remember how she fell. She is complaining of right shoulder pain. She was placed on backboard and c-collar by EMS. She was not on any anticoagulants. She fell 2 weeks ago and was seen in the ER and had normal CT head and neck. She had possible UTI but no placed on abx.    Level V caveat- dementia   Past Medical History  Diagnosis Date  . Anxiety   . Coronary artery disease   . Depression   . Hypertension   . Tachy-brady syndrome   . Permanent atrial fibrillation   . HX: anticoagulation   . Glaucoma(365)   . Dizziness   . SSS (sick sinus syndrome)   . GERD (gastroesophageal reflux disease)   . Fall   . Osteoarthritis   . Dementia     Past Surgical History  Procedure Laterality Date  . Insert / replace / remove pacemaker  01/24/2008    DDD PACEMAKER IMPLANT  . Cholecystectomy    . Gastric bypass    . Breast reduction surgery    . US echocardiography  08/10/2007    EF 60%  . US echocardiography  01/12/2007    EF 70%  . Cardiovascular stress test  02/15/2007  . Total knee arthroplasty      right  . Pacemaker insertion      Family History  Problem Relation Age of Onset  . Kidney failure Mother   . Heart attack Father   . Heart attack Sister   . Heart attack  Brother     History  Substance Use Topics  . Smoking status: Former Smoker    Quit date: 07/17/1961  . Smokeless tobacco: Not on file  . Alcohol Use: No    OB History   Grav Para Term Preterm Abortions TAB SAB Ect Mult Living                  Review of Systems  Unable to perform ROS: Dementia    Allergies  Codeine and Iodine  Home Medications   Current Outpatient Rx  Name  Route  Sig  Dispense  Refill  . acetaminophen (TYLENOL) 500 MG tablet   Oral   Take 500 mg by mouth every 4 (four) hours as needed for fever.          Marland Kitchen aspirin 325 MG tablet   Oral   Take 325 mg by mouth daily.         Marland Kitchen atenolol (TENORMIN) 50 MG tablet   Oral   Take 50 mg by mouth daily before breakfast.          . azelastine (OPTIVAR) 0.05 % ophthalmic solution   Both Eyes   Place 1 drop into both  eyes every 12 (twelve) hours.          . cholecalciferol (VITAMIN D) 1000 UNITS tablet   Oral   Take 1,000 Units by mouth daily.         . Cranberry-Vitamin C-Vitamin E (CRANBERRY PLUS VITAMIN C PO)   Oral   Take 2 capsules by mouth at bedtime.         . docusate sodium (STOOL SOFTENER) 100 MG capsule   Oral   Take 100 mg by mouth 2 (two) times daily.          Marland Kitchen donepezil (ARICEPT) 10 MG tablet   Oral   Take 10 mg by mouth every evening.          . latanoprost (XALATAN) 0.005 % ophthalmic solution   Both Eyes   Place 1 drop into both eyes daily.         Marland Kitchen lisinopril (PRINIVIL,ZESTRIL) 10 MG tablet   Oral   Take 10 mg by mouth daily before breakfast.          . mirtazapine (REMERON) 7.5 MG tablet   Oral   Take 7.5 mg by mouth at bedtime.         Marland Kitchen omeprazole (PRILOSEC) 20 MG capsule   Oral   Take 20 mg by mouth every morning.          . polyethylene glycol powder (GLYCOLAX/MIRALAX) powder   Oral   Take 17 g by mouth every other day. Dissolve in 8 oz of beverage of choice and drink         . potassium chloride (K-DUR,KLOR-CON) 10 MEQ tablet    Oral   Take 10 mEq by mouth every morning.          Marland Kitchen alum & mag hydroxide-simeth (MI-ACID) 200-200-20 MG/5ML suspension   Oral   Take 30 mLs by mouth as needed. Standing order for heartburn /indigestion.Not to exceed 4 doses in 24 hours.         Marland Kitchen guaiFENesin (ROBITUSSIN) 100 MG/5ML liquid   Oral   Take 200 mg by mouth every 6 (six) hours as needed for cough.         . loperamide (IMODIUM A-D) 2 MG tablet   Oral   Take 2 mg by mouth 4 (four) times daily as needed for diarrhea or loose stools.         Bertram Gala Glycol-Propyl Glycol (SYSTANE OP)   Both Eyes   Place 2 drops into both eyes 4 (four) times daily as needed (itching).            BP 150/98  Pulse 101  Temp(Src) 97.9 F (36.6 C) (Oral)  Resp 16  SpO2 94%  Physical Exam  Nursing note and vitals reviewed. Constitutional:  Chronically ill, uncomfortable   HENT:  Head: Normocephalic.  Mouth/Throat: Oropharynx is clear and moist.  Abrasion L forehead, + bruise on that area   Eyes: Conjunctivae are normal. Pupils are equal, round, and reactive to light.  Neck: Normal range of motion. Neck supple.  Cardiovascular: Normal rate, regular rhythm and normal heart sounds.   Pulmonary/Chest: Effort normal and breath sounds normal. No respiratory distress. She has no wheezes. She has no rales.  Abdominal: Soft. Bowel sounds are normal. She exhibits no distension. There is no tenderness. There is no guarding.  Musculoskeletal:  R humerus and shoulder obvious deformity, no open fracture. Shoulder abducted and rotated, unable to range shoulder. Nl ROM of elbow and wrist and forearm.  2+ pulses. ROM nl in other extremities no other obvious deformities.   Neurological: She is alert.  Demented   Skin: Skin is warm and dry.  Psychiatric: She has a normal mood and affect. Her behavior is normal. Judgment and thought content normal.    ED Course  Reduction of dislocation Date/Time: 03/04/2013 8:54 AM Performed by: Richardean Canal Authorized by: Richardean Canal Consent: Verbal consent obtained. Risks and benefits: risks, benefits and alternatives were discussed Consent given by: patient Patient understanding: patient states understanding of the procedure being performed Patient consent: the patient's understanding of the procedure matches consent given Procedure consent: procedure consent matches procedure scheduled Relevant documents: relevant documents present and verified Test results: test results available and properly labeled Site marked: the operative site was marked Imaging studies: imaging studies available Required items: required blood products, implants, devices, and special equipment available Patient identity confirmed: verbally with patient and arm band Preparation: Patient was prepped and draped in the usual sterile fashion. Local anesthesia used: no Patient sedated: yes Sedation type: moderate (conscious) sedation Sedatives: etomidate Analgesia: fentanyl and hydromorphone Sedation start date/time: 03/04/2013 8:15 AM Sedation end date/time: 03/04/2013 8:30 AM Patient tolerance: Patient tolerated the procedure well with no immediate complications. Comments: Unable to reduce the shoulder. Will call ortho.    (including critical care time)  Procedural sedation Performed by: Chaney Malling Consent: Verbal consent obtained. Risks and benefits: risks, benefits and alternatives were discussed Required items: required blood products, implants, devices, and special equipment available Patient identity confirmed: arm band and provided demographic data Time out: Immediately prior to procedure a "time out" was called to verify the correct patient, procedure, equipment, support staff and site/side marked as required.  Sedation type: moderate (conscious) sedation NPO time confirmed and considedered  Sedatives: ETOMIDATE  Physician Time at Bedside: 20 min   Vitals: Vital signs were monitored during  sedation. Cardiac Monitor, pulse oximeter Patient tolerance: Patient tolerated the procedure well with no immediate complications. Comments: Pt with uneventful recovered. Returned to pre-procedural sedation baseline    Labs Reviewed  URINALYSIS, ROUTINE W REFLEX MICROSCOPIC - Abnormal; Notable for the following:    Hgb urine dipstick TRACE (*)    Protein, ur 30 (*)    All other components within normal limits  COMPREHENSIVE METABOLIC PANEL - Abnormal; Notable for the following:    Sodium 146 (*)    Potassium 3.0 (*)    Glucose, Bld 113 (*)    GFR calc non Af Amer 63 (*)    GFR calc Af Amer 72 (*)    All other components within normal limits  URINE CULTURE  CBC WITH DIFFERENTIAL  URINE MICROSCOPIC-ADD ON  POCT I-STAT TROPONIN I   Ct Head Wo Contrast  03/04/2013  *RADIOLOGY REPORT*  Clinical Data:  Fall, head contusion, left frontal laceration, abrasion right side of head, history coronary artery disease, hypertension, atrial fibrillation, anticoagulation therapy, dementia  CT HEAD WITHOUT CONTRAST CT CERVICAL SPINE WITHOUT CONTRAST  Technique:  Multidetector CT imaging of the head and cervical spine was performed following the standard protocol without intravenous contrast.  Multiplanar CT image reconstructions of the cervical spine were also generated.  Comparison:  02/17/2013  CT HEAD  Findings: Generalized atrophy. Stable ventricular morphology. No midline shift or mass effect. Extensive small vessel chronic ischemic changes of deep cerebral white matter. 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 No intracranial hemorrhage, mass lesion or evidence of acute infarction. No extra-axial fluid collections. Left frontal scalp hematoma. Bones demineralized  but intact. Visualized paranasal sinuses and mastoid air cells clear.  IMPRESSION: Atrophy with small vessel chronic ischemic changes of deep cerebral white matter. 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 No acute intracranial  abnormalities.  CT CERVICAL SPINE  Findings: Minimal chronic opacification of a few inferior right mastoid air cells. Skull base intact. Osseous demineralization. Diffuse degenerative facet disease changes cervical spine. Disc space narrowing with minimal endplate spur formation at C5-C6. Minimal associated anterolisthesis of C5-C6 due to combination of degenerative disc and facet disease. Prevertebral soft tissues normal thickness. No acute fracture, subluxation or bone destruction. Visualized lung apices clear.  IMPRESSION:  Multilevel degenerative disc and facet disease changes cervical spine. No acute cervical spine abnormalities.   Original Report Authenticated By: Ulyses Southward, M.D.    Ct Cervical Spine Wo Contrast  03/04/2013  *RADIOLOGY REPORT*  Clinical Data:  Fall, head contusion, left frontal laceration, abrasion right side of head, history coronary artery disease, hypertension, atrial fibrillation, anticoagulation therapy, dementia  CT HEAD WITHOUT CONTRAST CT CERVICAL SPINE WITHOUT CONTRAST  Technique:  Multidetector CT imaging of the head and cervical spine was performed following the standard protocol without intravenous contrast.  Multiplanar CT image reconstructions of the cervical spine were also generated.  Comparison:  02/17/2013  CT HEAD  Findings: Generalized atrophy. Stable ventricular morphology. No midline shift or mass effect. Extensive small vessel chronic ischemic changes of deep cerebral white matter. 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 No intracranial hemorrhage, mass lesion or evidence of acute infarction. No extra-axial fluid collections. Left frontal scalp hematoma. Bones demineralized but intact. Visualized paranasal sinuses and mastoid air cells clear.  IMPRESSION: Atrophy with small vessel chronic ischemic changes of deep cerebral white matter. 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 No acute intracranial abnormalities.  CT CERVICAL SPINE  Findings: Minimal chronic  opacification of a few inferior right mastoid air cells. Skull base intact. Osseous demineralization. Diffuse degenerative facet disease changes cervical spine. Disc space narrowing with minimal endplate spur formation at C5-C6. Minimal associated anterolisthesis of C5-C6 due to combination of degenerative disc and facet disease. Prevertebral soft tissues normal thickness. No acute fracture, subluxation or bone destruction. Visualized lung apices clear.  IMPRESSION:  Multilevel degenerative disc and facet disease changes cervical spine. No acute cervical spine abnormalities.   Original Report Authenticated By: Ulyses Southward, M.D.    Dg Chest Port 1 View  03/04/2013  *RADIOLOGY REPORT*  Clinical Data: Recent fall with chest pain and shortness of breath  PORTABLE CHEST - 1 VIEW  Comparison: Portable chest x-ray of 07/28/2012  Findings: There appears to be anterior dislocation of the right humeral head with degenerative change.  Clinical correlation is recommended as to chronicity.  The lungs are clear.  Cardiomegaly is stable and a pacemaker remains.  Degenerative change is noted in the left shoulder.  IMPRESSION:  1.  Apparent anterior dislocation of the right humeral head. Correlate clinically. 2.  Stable cardiomegaly with permanent pacemaker.   Original Report Authenticated By: Dwyane Dee, M.D.    Dg Shoulder Right Port  03/04/2013  *RADIOLOGY REPORT*  Clinical Data: Post reduction  PORTABLE RIGHT SHOULDER - 2+ VIEW  Comparison: Right shoulder films from today  Findings: The dislocation of the right humeral head has been reduced.  No acute fracture is seen.  Degenerative spurring is noted from the acromion most consistent with impingement and probable chronic rotator cuff disease.  IMPRESSION: Reduction of anterior dislocation.   Original Report Authenticated By: Dwyane Dee, M.D.    Dg Shoulder  Right Port  03/04/2013  *RADIOLOGY REPORT*  Clinical Data: Post reduction  PORTABLE RIGHT SHOULDER - 2+ VIEW   Comparison: Prior post reduction films  Findings: There is no change in the apparent anterior dislocation of the right humeral head.  The superior aspect of the humeral head appears to be abutting the inferior glenoid rim.  No fracture is seen.  IMPRESSION: Persistent dislocation of the right humeral head, most likely anterior and inferior.   Original Report Authenticated By: Dwyane Dee, M.D.    Dg Shoulder Right Port  03/04/2013  *RADIOLOGY REPORT*  Clinical Data: Post reduction  PORTABLE RIGHT SHOULDER - 2+ VIEW  Comparison: Right shoulder films from earlier today  Findings: There is little change in apparent anterior dislocation of the right humeral head.  There is degenerative change of the right shoulder.  IMPRESSION: No change in the anterior dislocation of the right humeral head.   Original Report Authenticated By: Dwyane Dee, M.D.    Dg Shoulder Right Port  03/04/2013  *RADIOLOGY REPORT*  Clinical Data: Post reduction of the right shoulder dislocation  PORTABLE RIGHT SHOULDER - 2+ VIEW  Comparison: Right shoulder films from earlier today  Findings: There is no change in the dislocation of the right humeral head most likely anterior in position with degenerative change noted.  IMPRESSION: No change in probable anterior dislocation of the right humeral head.   Original Report Authenticated By: Dwyane Dee, M.D.    Dg Shoulder Right Port  03/04/2013  *RADIOLOGY REPORT*  Clinical Data: Larey Seat with right shoulder pain  PORTABLE RIGHT SHOULDER - 2+ VIEW  Comparison: Chest x-ray of 07/28/2012  Findings: There is anterior dislocation of the right humeral head with degenerative change.  No acute fracture is seen.  The right AC joint appears normally aligned.  IMPRESSION: Anterior dislocation of the right humeral head.   Original Report Authenticated By: Dwyane Dee, M.D.      No diagnosis found.   Date: 03/04/2013  Rate: 91  Rhythm: atrial fibrillation  QRS Axis: normal  Intervals: normal  ST/T Wave  abnormalities: nonspecific ST changes  Conduction Disutrbances:left anterior fascicular block  Narrative Interpretation:   Old EKG Reviewed: unchanged    MDM  Alison Dodson is a 79 y.o. female here with unwitnessed fall. Will get EKG, labs, repeat UA. Will get CT head/neck, xrays.   7:30 AM Attempt to reduce shoulder after given dilaudid, unsuccessful.   8:30 AM Unable to reduce after conscious sedation. Will call ortho to reduce.   11 AM Ortho (Dr. Magnus Ivan) present, shoulder reduced while I performed the conscious sedation. Repeat xray showed shoulder reduced. Patient awake afterwards. CT head/neck showed no acute fractures. She will f/u with Dr. Magnus Ivan in office in several days. She needs to wear sling at all times.        Richardean Canal, MD 03/04/13 [phone]

## 2013-03-04 NOTE — ED Notes (Signed)
Dr. Silverio Lay informed & aware of hypertension. No orders recweived

## 2013-03-04 NOTE — Progress Notes (Signed)
Orthopedic Tech Progress Note Patient Details:  Alison Dodson 1928/09/17 [id number] Assisted ED physician with shoulder dislocation reduction and applied sling to RUE. Ortho Devices Type of Ortho Device: Sling immobilizer Ortho Device/Splint Interventions: Application   Lesle Chris 03/04/2013, 8:02 AM

## 2013-03-04 NOTE — ED Notes (Signed)
ED MD at bedside manipulating right shoulder

## 2013-03-04 NOTE — ED Notes (Signed)
Repeat xray at bedside. Ortho tech & MD at bedside

## 2013-03-04 NOTE — ED Notes (Signed)
The patient was brought to [place] from [facility] by [facility] c/o right shoulder and elbow injury and left-sided forehead hematoma and laceration.  The staff at the SNF state that they found the patient on the floor at approximately 0530, however no one witnessed the patient fall.

## 2013-03-04 NOTE — ED Notes (Addendum)
Patient transported to CT. Dr Silverio Lay aware of continued hypertension

## 2013-03-04 NOTE — Discharge Instructions (Signed)
You need to wear the sling at ALL times.   Take ultram 50mg  every 6 hrs as needed for pain. You may take motrin 600 mg every 6 hrs for mild pain.   You need to see Dr. Magnus Ivan later this week.   Return to ER if you fall again, have severe pain, passing out.

## 2013-03-04 NOTE — ED Notes (Signed)
Pt unaware of events that lead to fall. C//o pain to right shoulder & head. Edema, hematoma & lac to forehead with bleeding controlled. Denies other injuries.

## 2013-03-05 LAB — URINE CULTURE: Colony Count: NO GROWTH

## 2013-03-05 NOTE — Op Note (Signed)
NAMEBERTHE, OLEY NO.:  [id number]  MEDICAL RECORD NO.:  [id number]  LOCATION:  C28C                         FACILITY:  [facility]  PHYSICIAN:  Vanita Panda. Magnus Ivan, M.D.DATE OF BIRTH:  [birth year]  DATE OF PROCEDURE: DATE OF DISCHARGE:  03/04/2013                              OPERATIVE REPORT   PRE-PROCEDURE DIAGNOSIS:  Right anterior shoulder dislocation.  POSTPROCEDURE DIAGNOSIS:  Right anterior shoulder dislocation.  PROCEDURE:  Closed reduction under conscious sedation, right anterior shoulder dislocation.  SURGEON:  Vanita Panda. Magnus Ivan, M.D.  ANESTHESIA: 1. Etomidate by the ER staff. 2. Local in the right shoulder joint with 10 mL of 0.5% plain     Marcaine.  COMPLICATIONS:  None.  INDICATIONS:  Ms. Alberico is an 79 year old nursing home resident who has had multiple falls.  She was found down in her room today and had significant shoulder swelling.  She was found to have a dislocation when she was brought to the emergency room.  They were unable to reduce the shoulder but were concerned about the swelling of her shoulder and did not want to cause any further injury so they called for our assistance to reduce it.  Looking at her plain film, she may have an inferior Bankart lesion of the humerus, it is definitely dislocated and in need of attempted closed reduction.  The ER staff __________ Etomidate again __________may control her dislocation.  After cleaning the shoulder with Betadine and alcohol, we placed 10 mL of Marcaine into the glenohumeral joint.  Etomidate was then given and with contraction and counter traction, I was able to reduce the shoulder.  It was not the most stable in reduction but once I got her in and let her tighten up with a sling and swath, we obtained an x-ray at the bedside and it was located.  She then awakened easily from her sedation and was able to move her fingers and thumb.  Her disposition is now per the ER  staff.  From an orthopedic standpoint, we would want her to remain in her sling with no function of that shoulder.    Vanita Panda. Magnus Ivan, M.D.    CYB/MEDQ  D:  03/04/2013  T:  03/05/2013  Job:  [id number]

## 2013-03-06 ENCOUNTER — Inpatient Hospital Stay (HOSPITAL_COMMUNITY)
Admission: EM | Admit: 2013-03-06 | Discharge: 2013-03-08 | DRG: 069 | Disposition: A | Discharge status: Skilled Nursing Facility | Payer: Medicare Other | Attending: Family Medicine | Admitting: Family Medicine

## 2013-03-06 ENCOUNTER — Emergency Department (HOSPITAL_COMMUNITY): Payer: Medicare Other

## 2013-03-06 DIAGNOSIS — I4891 Unspecified atrial fibrillation: Secondary | ICD-10-CM

## 2013-03-06 DIAGNOSIS — I10 Essential (primary) hypertension: Secondary | ICD-10-CM | POA: Diagnosis present

## 2013-03-06 DIAGNOSIS — G459 Transient cerebral ischemic attack, unspecified: Secondary | ICD-10-CM | POA: Diagnosis present

## 2013-03-06 DIAGNOSIS — F0280 Dementia in other diseases classified elsewhere without behavioral disturbance: Secondary | ICD-10-CM

## 2013-03-06 LAB — CBC
Hemoglobin: 10 g/dL — ABNORMAL LOW (ref 12.0–15.0)
Platelets: 216 10*3/uL (ref 150–400)
RBC: 3.19 MIL/uL — ABNORMAL LOW (ref 3.87–5.11)
WBC: 8.7 10*3/uL (ref 4.0–10.5)

## 2013-03-06 LAB — COMPREHENSIVE METABOLIC PANEL
ALT: 7 U/L (ref 0–35)
Alkaline Phosphatase: 89 U/L (ref 39–117)
CO2: 26 mEq/L (ref 19–32)
Chloride: 101 mEq/L (ref 96–112)
GFR calc Af Amer: 65 mL/min — ABNORMAL LOW (ref >90)
Glucose, Bld: 120 mg/dL — ABNORMAL HIGH (ref 70–99)
Potassium: 3.2 mEq/L — ABNORMAL LOW (ref 3.5–5.1)
Sodium: 137 mEq/L (ref 135–145)
Total Bilirubin: 1 mg/dL (ref 0.3–1.2)
Total Protein: 5.9 g/dL — ABNORMAL LOW (ref 6.0–8.3)

## 2013-03-06 LAB — POCT I-STAT, CHEM 8
Calcium, Ion: 1.16 mmol/L (ref 1.13–1.30)
Creatinine, Ser: 0.9 mg/dL (ref 0.50–1.10)
Glucose, Bld: 120 mg/dL — ABNORMAL HIGH (ref 70–99)
Hemoglobin: 9.9 g/dL — ABNORMAL LOW (ref 12.0–15.0)
Potassium: 3.2 mEq/L — ABNORMAL LOW (ref 3.5–5.1)
TCO2: 28 mmol/L (ref 0–100)

## 2013-03-06 LAB — DIFFERENTIAL
Eosinophils Absolute: 0.2 10*3/uL (ref 0.0–0.7)
Lymphocytes Relative: 26 % (ref 12–46)
Lymphs Abs: 2.3 10*3/uL (ref 0.7–4.0)
Monocytes Relative: 11 % (ref 3–12)
Neutro Abs: 5.2 10*3/uL (ref 1.7–7.7)
Neutrophils Relative %: 60 % (ref 43–77)

## 2013-03-06 LAB — URINALYSIS, ROUTINE W REFLEX MICROSCOPIC
Bilirubin Urine: NEGATIVE
Glucose, UA: NEGATIVE mg/dL
Hgb urine dipstick: NEGATIVE
Protein, ur: NEGATIVE mg/dL

## 2013-03-06 LAB — PROTIME-INR
INR: 1.13 (ref 0.00–1.49)
Prothrombin Time: 14.3 seconds (ref 11.6–15.2)

## 2013-03-06 LAB — APTT: aPTT: 29 seconds (ref 24–37)

## 2013-03-06 MED ORDER — MIRTAZAPINE 7.5 MG PO TABS
7.5000 mg | ORAL_TABLET | Freq: Every day | ORAL | Status: DC
Start: 2013-03-06 — End: 2013-03-08
  Administered 2013-03-06 – 2013-03-07 (×2): 7.5 mg via ORAL
  Filled 2013-03-06 (×3): qty 1

## 2013-03-06 MED ORDER — ASPIRIN 325 MG PO TABS
325.0000 mg | ORAL_TABLET | Freq: Every day | ORAL | Status: DC
  Administered 2013-03-06: 325 mg via ORAL
  Filled 2013-03-06 (×2): qty 1

## 2013-03-06 MED ORDER — ALUM & MAG HYDROXIDE-SIMETH 200-200-20 MG/5ML PO SUSP: 30.0000 mL | ORAL | Status: DC | PRN

## 2013-03-06 MED ORDER — ACETAMINOPHEN 500 MG PO TABS
500.0000 mg | ORAL_TABLET | ORAL | Status: DC | PRN
  Filled 2013-03-06: qty 1

## 2013-03-06 MED ORDER — LISINOPRIL 10 MG PO TABS
10.0000 mg | ORAL_TABLET | Freq: Every day | ORAL | Status: DC
  Administered 2013-03-07 – 2013-03-08 (×2): 10 mg via ORAL
  Filled 2013-03-06 (×4): qty 1

## 2013-03-06 MED ORDER — LATANOPROST 0.005 % OP SOLN
1.0000 [drp] | Freq: Every day | OPHTHALMIC | Status: DC
  Administered 2013-03-06 – 2013-03-08 (×3): 1 [drp] via OPHTHALMIC
  Filled 2013-03-06 (×2): qty 2.5

## 2013-03-06 MED ORDER — DOCUSATE SODIUM 100 MG PO CAPS
100.0000 mg | ORAL_CAPSULE | Freq: Two times a day (BID) | ORAL | Status: DC
  Administered 2013-03-06 – 2013-03-08 (×4): 100 mg via ORAL
  Filled 2013-03-06 (×4): qty 1

## 2013-03-06 MED ORDER — OLOPATADINE HCL 0.1 % OP SOLN
1.0000 [drp] | Freq: Two times a day (BID) | OPHTHALMIC | Status: DC
  Administered 2013-03-06 – 2013-03-08 (×4): 1 [drp] via OPHTHALMIC
  Filled 2013-03-06: qty 5

## 2013-03-06 MED ORDER — TRAMADOL HCL 50 MG PO TABS
50.0000 mg | ORAL_TABLET | Freq: Four times a day (QID) | ORAL | Status: DC | PRN
  Administered 2013-03-06 – 2013-03-07 (×4): 50 mg via ORAL
  Filled 2013-03-06 (×4): qty 1

## 2013-03-06 MED ORDER — HEPARIN SODIUM (PORCINE) 5000 UNIT/ML IJ SOLN
5000.0000 [IU] | Freq: Three times a day (TID) | INTRAMUSCULAR | Status: DC
  Administered 2013-03-06 – 2013-03-08 (×6): 5000 [IU] via SUBCUTANEOUS
  Filled 2013-03-06 (×8): qty 1

## 2013-03-06 MED ORDER — AZELASTINE HCL 0.05 % OP SOLN: 1.0000 [drp] | Freq: Two times a day (BID) | OPHTHALMIC | Status: DC

## 2013-03-06 MED ORDER — DONEPEZIL HCL 10 MG PO TABS
10.0000 mg | ORAL_TABLET | Freq: Every evening | ORAL | Status: DC
  Administered 2013-03-06 – 2013-03-08 (×3): 10 mg via ORAL
  Filled 2013-03-06 (×3): qty 1

## 2013-03-06 MED ORDER — VITAMIN D3 25 MCG (1000 UNIT) PO TABS
1000.0000 [IU] | ORAL_TABLET | Freq: Every day | ORAL | Status: DC
  Administered 2013-03-06 – 2013-03-08 (×3): 1000 [IU] via ORAL
  Filled 2013-03-06 (×3): qty 1

## 2013-03-06 MED ORDER — POLYETHYLENE GLYCOL 3350 17 GM/SCOOP PO POWD
17.0000 g | ORAL | Status: DC
  Administered 2013-03-06: 17 g via ORAL
  Filled 2013-03-06: qty 255

## 2013-03-06 MED ORDER — LOPERAMIDE HCL 2 MG PO CAPS
2.0000 mg | ORAL_CAPSULE | Freq: Four times a day (QID) | ORAL | Status: DC | PRN
  Filled 2013-03-06: qty 1

## 2013-03-06 MED ORDER — GUAIFENESIN 100 MG/5ML PO SOLN
200.0000 mg | Freq: Four times a day (QID) | ORAL | Status: DC | PRN
  Filled 2013-03-06: qty 10

## 2013-03-06 MED ORDER — PANTOPRAZOLE SODIUM 40 MG PO TBEC
40.0000 mg | DELAYED_RELEASE_TABLET | Freq: Every day | ORAL | Status: DC
  Administered 2013-03-06 – 2013-03-08 (×3): 40 mg via ORAL
  Filled 2013-03-06 (×3): qty 1

## 2013-03-06 MED ORDER — POTASSIUM CHLORIDE CRYS ER 10 MEQ PO TBCR
10.0000 meq | EXTENDED_RELEASE_TABLET | Freq: Every morning | ORAL | Status: DC
  Administered 2013-03-07 – 2013-03-08 (×2): 10 meq via ORAL
  Filled 2013-03-06 (×2): qty 1

## 2013-03-06 MED ORDER — ATENOLOL 50 MG PO TABS
50.0000 mg | ORAL_TABLET | Freq: Every day | ORAL | Status: DC
  Administered 2013-03-07 – 2013-03-08 (×2): 50 mg via ORAL
  Filled 2013-03-06 (×4): qty 1

## 2013-03-06 NOTE — ED Notes (Signed)
Pt is a resident of [address] [facility] ([place] she was reportedly eating dinner with a staff member when she was noted to have stopped eating and had R sided gaze and a L sided facial droop.  Staff immediately called 911.  On EMS arrival pt was noted to have continued stroke like symptoms.  CBG was 91.  On arrival to ER pt was noted to be more alert with a improvement with her Gaze and L sided facial droop.

## 2013-03-06 NOTE — ED Notes (Signed)
Admitting MD at bedside.

## 2013-03-06 NOTE — Code Documentation (Signed)
79 year old female transported to ED by [facility] as Code Stroke.  Patient was eating dinner at facility = was talking with staff and had sudden onset of slurred speech left hemiparesis and left facial droop with right gaze preference.  EMS noted same sx and called the code stroke in the field at 1708 with 7-10 min ETA.  Patient arrived to ED at 1712.  Seen at the bridge by EDP at 1713 - airway cleared and taken to CT scan at 1718.  Stroke team arrival at 712.  LSW 1630.  Post CT scan patients sx began to clear.  NIHSS was 3 with both questions missed (Hx Alzhemiers dementia) and left leg drift.  Bp was normal. CBG 91.  Patient has large left facial bruising and right shoulder bruising from fall 2 days ago.  CT negative for bleed.  Code stroke cancelled at 1758 - sx resolving but not at baseline.  Handoff with Wes RN - patient still eligible for tpa until 1930 - frequent neuro checks to be maintained by ED staff - if sx worsen - repage Code Stroke team.

## 2013-03-06 NOTE — ED Notes (Signed)
Code Stroke Called 1710 Patient Arrival 1712 EDP exam 1713 Stroke Team arrival 1712 Last scene normal 1625 Pt arrival in CT 1718 Phlebotomist 1718 CT read my Dr. Amada Jupiter 1728

## 2013-03-06 NOTE — ED Provider Notes (Signed)
Patient presents with decrease in mental status associated with left-sided facial droop and drooling and right-sided gaze deviation. On arrival she was alert and protecting her airway. Her head CT was negative. Her neurologic symptoms were improving and she was not felt given this to be a TPA candidate. She was evaluated by neurology as a code stroke and will be admitted to the hospital for further stroke evaluation.  Rolan Bucco, MD 03/06/13 [phone]

## 2013-03-06 NOTE — H&P (Addendum)
[facility] History and Physical  Alison Dodson NWG:[id number] DOB: 01-Mar-1928 DOA: 03/06/2013  Referring physician: ED PCP: Gaspar Garbe, MD Specialists: [facility] (Cards)  Chief Complaint: L sided weakness, speech difficulty  HPI: Alison Dodson is a 79 y.o. female who developed R sided gaze and L sided facial droop abruptly when eating dinner this evening at her NH.  Staff was present when symptoms onset and called 911.  Patient was taken to the hospital, code stroke was called, but patient deficits began to improve and resolve rather quickly and so she was not given TPA.  She essentially has returned to baseline according to her family who is at bedside.  CT head was negative for intracranial process (just showed a hematoma on her skull from where she had a fall 2 days ago).  Patient has had TIAs in the past and has known A.fib.  Unfortunately she was taken off of coumadin some time ago due to her very high fall risk (patient actually just had fall 2 days ago and hit her head).  Neurology consulted and hospitalist is admitting.  Review of Systems: positive for R shoulder pain, cannot really complete a full ROS due to dementia.  Past Medical History  Diagnosis Date  . Anxiety   . Coronary artery disease   . Depression   . Hypertension   . Tachy-brady syndrome   . Permanent atrial fibrillation   . HX: anticoagulation   . Glaucoma(365)   . Dizziness   . SSS (sick sinus syndrome)   . GERD (gastroesophageal reflux disease)   . Fall   . Osteoarthritis   . Dementia    Past Surgical History  Procedure Laterality Date  . Insert / replace / remove pacemaker  01/24/2008    DDD PACEMAKER IMPLANT  . Cholecystectomy    . Gastric bypass    . Breast reduction surgery    . US echocardiography  08/10/2007    EF 60%  . US echocardiography  01/12/2007    EF 70%  . Cardiovascular stress test  02/15/2007  . Total knee arthroplasty      right  . Pacemaker insertion     Social  History:  reports that she quit smoking about 51 years ago. She does not have any smokeless tobacco history on file. She reports that she does not drink alcohol or use illicit drugs.   Allergies  Allergen Reactions  . Codeine Other (See Comments)    Per MAR  . Iodine Other (See Comments)    Per MAR    Family History  Problem Relation Age of Onset  . Kidney failure Mother   . Heart attack Father   . Heart attack Sister   . Heart attack Brother     Prior to Admission medications   Medication Sig Start Date End Date Taking? Authorizing Provider  acetaminophen (TYLENOL) 500 MG tablet Take 500 mg by mouth every 4 (four) hours as needed for fever.     Historical Provider, MD  alum & mag hydroxide-simeth (MI-ACID) 200-200-20 MG/5ML suspension Take 30 mLs by mouth as needed. Standing order for heartburn /indigestion.Not to exceed 4 doses in 24 hours.    Historical Provider, MD  aspirin 325 MG tablet Take 325 mg by mouth daily.    Historical Provider, MD  atenolol (TENORMIN) 50 MG tablet Take 50 mg by mouth daily before breakfast.     Historical Provider, MD  azelastine (OPTIVAR) 0.05 % ophthalmic solution Place 1 drop into both eyes every  12 (twelve) hours.     Historical Provider, MD  cholecalciferol (VITAMIN D) 1000 UNITS tablet Take 1,000 Units by mouth daily.    Historical Provider, MD  Cranberry-Vitamin C-Vitamin E (CRANBERRY PLUS VITAMIN C PO) Take 2 capsules by mouth at bedtime.    Historical Provider, MD  docusate sodium (STOOL SOFTENER) 100 MG capsule Take 100 mg by mouth 2 (two) times daily.     Historical Provider, MD  donepezil (ARICEPT) 10 MG tablet Take 10 mg by mouth every evening.     Historical Provider, MD  guaiFENesin (ROBITUSSIN) 100 MG/5ML liquid Take 200 mg by mouth every 6 (six) hours as needed for cough.    Historical Provider, MD  latanoprost (XALATAN) 0.005 % ophthalmic solution Place 1 drop into both eyes daily.    Historical Provider, MD  lisinopril  (PRINIVIL,ZESTRIL) 10 MG tablet Take 10 mg by mouth daily before breakfast.     Historical Provider, MD  loperamide (IMODIUM A-D) 2 MG tablet Take 2 mg by mouth 4 (four) times daily as needed for diarrhea or loose stools.    Historical Provider, MD  mirtazapine (REMERON) 7.5 MG tablet Take 7.5 mg by mouth at bedtime.    Historical Provider, MD  omeprazole (PRILOSEC) 20 MG capsule Take 20 mg by mouth every morning.     Historical Provider, MD  Polyethyl Glycol-Propyl Glycol (SYSTANE OP) Place 2 drops into both eyes 4 (four) times daily as needed (itching).     Historical Provider, MD  polyethylene glycol powder (GLYCOLAX/MIRALAX) powder Take 17 g by mouth every other day. Dissolve in 8 oz of beverage of choice and drink 12/13/12   Historical Provider, MD  potassium chloride (K-DUR,KLOR-CON) 10 MEQ tablet Take 10 mEq by mouth every morning.     Historical Provider, MD  traMADol (ULTRAM) 50 MG tablet Take 1 tablet (50 mg total) by mouth every 6 (six) hours as needed for pain. 03/04/13   Richardean Canal, MD   Physical Exam: Filed Vitals:   03/06/13 1800 03/06/13 1815 03/06/13 1830 03/06/13 1845  BP: 142/68 130/67 138/83 142/90  Pulse: 77 75 69 75  Resp: 13 15 15 15   SpO2: 98% 98% 98% 98%    General:  NAD, resting comfortably in bed Eyes: PEERLA EOMI ENT: mucous membranes moist Neck: supple w/o JVD Cardiovascular: irregularly irregular w/o MRG Respiratory: CTA B Abdomen: soft, nt, nd, bs+ Skin: echymosis on forehead, face, and over R shoulder from prior dislocation Musculoskeletal: MAE, full ROM all 4 extremities Psychiatric: normal tone and affect Neurologic: Awake, significant dementia, drift LLE, otherwise strength 5/5 in other extremities  Labs on Admission:  Basic Metabolic Panel:  Recent Labs Lab 03/04/13 0734 03/06/13 1718 03/06/13 1739  NA 146* 137 141  K 3.0* 3.2* 3.2*  CL 107 101 103  CO2 27 26  --   GLUCOSE 113* 120* 120*  BUN 13 23 24*  CREATININE 0.84 0.92 0.90  CALCIUM  9.6 8.9  --    Liver Function Tests:  Recent Labs Lab 03/04/13 0734 03/06/13 1718  AST 17 21  ALT 7 7  ALKPHOS 105 89  BILITOT 1.0 1.0  PROT 6.9 5.9*  ALBUMIN 3.8 3.0*   No results found for this basename: LIPASE, AMYLASE,  in the last 168 hours No results found for this basename: AMMONIA,  in the last 168 hours CBC:  Recent Labs Lab 03/04/13 0734 03/06/13 1718 03/06/13 1739  WBC 10.4 8.7  --   NEUTROABS 6.9 5.2  --  HGB 14.3 10.0* 9.9*  HCT 41.0 29.6* 29.0*  MCV 92.6 92.8  --   PLT 255 216  --    Cardiac Enzymes:  Recent Labs Lab 03/06/13 1719  TROPONINI <0.30    BNP (last 3 results) No results found for this basename: PROBNP,  in the last 8760 hours CBG:  Recent Labs Lab 03/06/13 1753  GLUCAP 114*    Radiological Exams on Admission: Ct Head (brain) Wo Contrast  03/06/2013  *RADIOLOGY REPORT*  Clinical Data: Left-sided weakness.  Recent fall.  CT HEAD WITHOUT CONTRAST  Technique:  Contiguous axial images were obtained from the base of the skull through the vertex without contrast.  Comparison: 03/04/2013.  Findings: Left frontal subcutaneous hematoma as noted on recent CT. No skull fracture or intracranial hemorrhage.  Significant small vessel disease type changes without CT evidence of large acute infarct.  Given the degree of white matter type changes, limited for excluding a small acute infarct.  No intracranial mass lesion detected on this unenhanced exam.  Global atrophy without hydrocephalus.  Vascular calcifications.  IMPRESSION:  Left frontal subcutaneous hematoma as noted on recent CT.  No skull fracture or intracranial hemorrhage.  Significant small vessel disease type changes without CT evidence of large acute infarct.  Given the degree of white matter type changes, limited for excluding a small acute infarct.  Global atrophy without hydrocephalus.  Code stroke called to Dr. Amada Jupiter 03/06/2013 5:40 p.m.   Original Report Authenticated By: Lacy Duverney, M.D.     EKG: Independently reviewed.  Assessment/Plan Principal Problem:   TIA (transient ischemic attack) Active Problems:   Essential hypertension, benign   Atrial fibrillation   Alzheimer's dementia   1. TIA - patient on stroke pathway, neuro has seen patient, biggest risk factor likely the A.fib but not candidate for anticoagulation (discussed with family), family actually dosent want meds changed at this time so will also hold off on adding plavix to the ASA for now.  Carotid dopplers and 2d echo, cant have MRI due to pacer in place.  Admit to tele. 2. HTN - continue home meds, Dr. Roseanne Reno didn't have strong feelings about needing to hold these. 3. A.Fib - rate controlled 4. Alzheimer's dementia - stage 6, severe, and baseline at this point.  Dr. Amada Jupiter has already seen patient in consultation, note in chart.  Code Status: Full code for now (must indicate code status--if unknown or must be presumed, indicate so) Family Communication: Spoke with daughter at bedside (indicate person spoken with, if applicable, with phone number if by telephone) Disposition Plan: Admit to inpatient (indicate anticipated LOS)  Time spent: 70 min  Sir Mallis M. [facility] Pager [phone]  If 7PM-7AM, please contact night-coverage [email or web address] Password [facility] 03/06/2013, 8:02 PM

## 2013-03-06 NOTE — ED Provider Notes (Signed)
History     CSN: [id number]  Arrival date & time 03/06/13  1714   First MD Initiated Contact with Patient 03/06/13 1716      Chief Complaint  Patient presents with  . Code Stroke    (Consider location/radiation/quality/duration/timing/severity/associated sxs/prior treatment) Patient is a 79 y.o. female presenting with neurologic complaint. The history is provided by the EMS personnel and medical records.  Neurologic Problem The primary symptoms include altered mental status and focal weakness (left upper extremity). Primary symptoms do not include headaches, syncope, loss of consciousness or visual change. The symptoms began less than 1 hour ago (50 mins PTA (16:30)). The episode lasted 50 minutes. The symptoms are unchanged. The neurological symptoms are focal. Context: at rest.  Associated medical issues comments: Dementia; recurrent falls.    Past Medical History  Diagnosis Date  . Anxiety   . Coronary artery disease   . Depression   . Hypertension   . Tachy-brady syndrome   . Permanent atrial fibrillation   . HX: anticoagulation   . Glaucoma(365)   . Dizziness   . SSS (sick sinus syndrome)   . GERD (gastroesophageal reflux disease)   . Fall   . Osteoarthritis   . Dementia     Past Surgical History  Procedure Laterality Date  . Insert / replace / remove pacemaker  01/24/2008    DDD PACEMAKER IMPLANT  . Cholecystectomy    . Gastric bypass    . Breast reduction surgery    . US echocardiography  08/10/2007    EF 60%  . US echocardiography  01/12/2007    EF 70%  . Cardiovascular stress test  02/15/2007  . Total knee arthroplasty      right  . Pacemaker insertion      Family History  Problem Relation Age of Onset  . Kidney failure Mother   . Heart attack Father   . Heart attack Sister   . Heart attack Brother     History  Substance Use Topics  . Smoking status: Former Smoker    Quit date: 07/17/1961  . Smokeless tobacco: Not on file  . Alcohol Use: No     OB History   Grav Para Term Preterm Abortions TAB SAB Ect Mult Living                  Review of Systems  Unable to perform ROS: Dementia  Cardiovascular: Negative for syncope.  Neurological: Positive for focal weakness (left upper extremity). Negative for loss of consciousness and headaches.  Psychiatric/Behavioral: Positive for altered mental status.    Allergies  Codeine and Iodine  Home Medications   Current Outpatient Rx  Name  Route  Sig  Dispense  Refill  . acetaminophen (TYLENOL) 500 MG tablet   Oral   Take 500 mg by mouth every 4 (four) hours as needed for fever.          Marland Kitchen alum & mag hydroxide-simeth (MI-ACID) 200-200-20 MG/5ML suspension   Oral   Take 30 mLs by mouth as needed. Standing order for heartburn /indigestion.Not to exceed 4 doses in 24 hours.         Marland Kitchen aspirin 325 MG tablet   Oral   Take 325 mg by mouth daily.         Marland Kitchen atenolol (TENORMIN) 50 MG tablet   Oral   Take 50 mg by mouth daily before breakfast.          . azelastine (OPTIVAR) 0.05 % ophthalmic solution  Both Eyes   Place 1 drop into both eyes every 12 (twelve) hours.          . cholecalciferol (VITAMIN D) 1000 UNITS tablet   Oral   Take 1,000 Units by mouth daily.         . Cranberry-Vitamin C-Vitamin E (CRANBERRY PLUS VITAMIN C PO)   Oral   Take 2 capsules by mouth at bedtime.         . docusate sodium (STOOL SOFTENER) 100 MG capsule   Oral   Take 100 mg by mouth 2 (two) times daily.          Marland Kitchen donepezil (ARICEPT) 10 MG tablet   Oral   Take 10 mg by mouth every evening.          Marland Kitchen guaiFENesin (ROBITUSSIN) 100 MG/5ML liquid   Oral   Take 200 mg by mouth every 6 (six) hours as needed for cough.         . latanoprost (XALATAN) 0.005 % ophthalmic solution   Both Eyes   Place 1 drop into both eyes daily.         Marland Kitchen lisinopril (PRINIVIL,ZESTRIL) 10 MG tablet   Oral   Take 10 mg by mouth daily before breakfast.          . loperamide (IMODIUM  A-D) 2 MG tablet   Oral   Take 2 mg by mouth 4 (four) times daily as needed for diarrhea or loose stools.         . mirtazapine (REMERON) 7.5 MG tablet   Oral   Take 7.5 mg by mouth at bedtime.         Marland Kitchen omeprazole (PRILOSEC) 20 MG capsule   Oral   Take 20 mg by mouth every morning.          Bertram Gala Glycol-Propyl Glycol (SYSTANE OP)   Both Eyes   Place 2 drops into both eyes 4 (four) times daily as needed (itching).          . polyethylene glycol powder (GLYCOLAX/MIRALAX) powder   Oral   Take 17 g by mouth every other day. Dissolve in 8 oz of beverage of choice and drink         . potassium chloride (K-DUR,KLOR-CON) 10 MEQ tablet   Oral   Take 10 mEq by mouth every morning.          . traMADol (ULTRAM) 50 MG tablet   Oral   Take 1 tablet (50 mg total) by mouth every 6 (six) hours as needed for pain.   15 tablet   0     There were no vitals taken for this visit.  Physical Exam  Nursing note and vitals reviewed. Constitutional: She appears well-developed and well-nourished.  HENT:  Head: Normocephalic and atraumatic.  Right Ear: External ear normal.  Left Ear: External ear normal.  Nose: Nose normal.  Mouth/Throat: Oropharynx is clear and moist. No oropharyngeal exudate.  Eyes: Conjunctivae are normal.  Neck: Normal range of motion. Neck supple.  Cardiovascular: Normal rate, regular rhythm, normal heart sounds and intact distal pulses.  Exam reveals no gallop and no friction rub.   No murmur heard. Pulmonary/Chest: Effort normal and breath sounds normal.  Abdominal: Soft. Bowel sounds are normal. She exhibits no distension and no mass. There is no tenderness. There is no rebound and no guarding.  Musculoskeletal: Normal range of motion. She exhibits no edema and no tenderness.  Neurological: She is alert. She exhibits abnormal  muscle tone (weakness in left upper extremity).  Oriented x 1   Skin: There is erythema.  Ecchymosis overlying forehead and  left face  Psychiatric: She has a normal mood and affect. Her behavior is normal. Judgment and thought content normal.    ED Course  Procedures (including critical care time)  Labs Reviewed  CBC - Abnormal; Notable for the following:    RBC 3.19 (*)    Hemoglobin 10.0 (*)    HCT 29.6 (*)    All other components within normal limits  COMPREHENSIVE METABOLIC PANEL - Abnormal; Notable for the following:    Potassium 3.2 (*)    Glucose, Bld 120 (*)    Total Protein 5.9 (*)    Albumin 3.0 (*)    GFR calc non Af Amer 56 (*)    GFR calc Af Amer 65 (*)    All other components within normal limits  GLUCOSE, CAPILLARY - Abnormal; Notable for the following:    Glucose-Capillary 114 (*)    All other components within normal limits  URINALYSIS, ROUTINE W REFLEX MICROSCOPIC - Abnormal; Notable for the following:    Urobilinogen, UA 4.0 (*)    All other components within normal limits  POCT I-STAT, CHEM 8 - Abnormal; Notable for the following:    Potassium 3.2 (*)    BUN 24 (*)    Glucose, Bld 120 (*)    Hemoglobin 9.9 (*)    HCT 29.0 (*)    All other components within normal limits  PROTIME-INR  APTT  DIFFERENTIAL  TROPONIN I  HEMOGLOBIN A1C  LIPID PANEL   Ct Head (brain) Wo Contrast  03/06/2013  *RADIOLOGY REPORT*  Clinical Data: Left-sided weakness.  Recent fall.  CT HEAD WITHOUT CONTRAST  Technique:  Contiguous axial images were obtained from the base of the skull through the vertex without contrast.  Comparison: 03/04/2013.  Findings: Left frontal subcutaneous hematoma as noted on recent CT. No skull fracture or intracranial hemorrhage.  Significant small vessel disease type changes without CT evidence of large acute infarct.  Given the degree of white matter type changes, limited for excluding a small acute infarct.  No intracranial mass lesion detected on this unenhanced exam.  Global atrophy without hydrocephalus.  Vascular calcifications.  IMPRESSION:  Left frontal subcutaneous  hematoma as noted on recent CT.  No skull fracture or intracranial hemorrhage.  Significant small vessel disease type changes without CT evidence of large acute infarct.  Given the degree of white matter type changes, limited for excluding a small acute infarct.  Global atrophy without hydrocephalus.  Code stroke called to Dr. Amada Jupiter 03/06/2013 5:40 p.m.   Original Report Authenticated By: Lacy Duverney, M.D.      1. TIA (transient ischemic attack)   2. Alzheimer's dementia   3. Atrial fibrillation   4. Essential hypertension, benign     MDM  79 yo F w/hx of dementia and multiple recent falls presents for acute onset of left-sided weakness and worsened confusion from baseline (onset 16:30). Code Stroke called prior to arrival. Patient protecting airway and vital signs within normal limits; patient taken to CT emergently. CT negative for hemorrhage or evidence of acute infarction. On arrival back from CT, left-sided weakness resolving. Neurology canceled code stroke. Suspect TIA with symptoms resolved and back to baseline. Hospitalist service to admit.         Clemetine Marker, MD 03/07/13 0100

## 2013-03-06 NOTE — Consult Note (Signed)
Referring Physician: Dr. Fredderick Phenix    Chief Complaint: left sided weakness, speech difficulty  HPI: Alison Dodson is an 79 y.o. female who was LKW at 1630 on 03/06/2013 at her facility. She was eating with other residents and they notice that her speech was slurred, she appeared confused and she had left sided weakness. EMS was called and she was brought in as CODE STROKE.   She did have a fall within the past week and was seen in the ED. She has ecchymosis on her face and left shoulder (closed reduction). She has a PPM with a history of afib. She is not on coumadin. Records indicate that she has dementia and is also a fall risk. She does take 325mg  aspirin daily.  Date last known well: 03/06/2013 Time last known well: 1630  tPA Given: No: patients symptoms resolving.  Past Medical History  Diagnosis Date  . Anxiety   . Coronary artery disease   . Depression   . Hypertension   . Tachy-brady syndrome   . Permanent atrial fibrillation   . HX: anticoagulation   . Glaucoma(365)   . Dizziness   . SSS (sick sinus syndrome)   . GERD (gastroesophageal reflux disease)   . Fall   . Osteoarthritis   . Dementia     Past Surgical History  Procedure Laterality Date  . Insert / replace / remove pacemaker  01/24/2008    DDD PACEMAKER IMPLANT  . Cholecystectomy    . Gastric bypass    . Breast reduction surgery    . US echocardiography  08/10/2007    EF 60%  . US echocardiography  01/12/2007    EF 70%  . Cardiovascular stress test  02/15/2007  . Total knee arthroplasty      right  . Pacemaker insertion      Family History  Problem Relation Age of Onset  . Kidney failure Mother   . Heart attack Father   . Heart attack Sister   . Heart attack Brother    Social History:  reports that she quit smoking about 51 years ago. She does not have any smokeless tobacco history on file. She reports that she does not drink alcohol or use illicit drugs.  Allergies:  Allergies  Allergen Reactions   . Codeine Other (See Comments)    Per MAR  . Iodine Other (See Comments)    Per MAR    No current facility-administered medications for this encounter.   Current Outpatient Prescriptions  Medication Sig Dispense Refill  . acetaminophen (TYLENOL) 500 MG tablet Take 500 mg by mouth every 4 (four) hours as needed for fever.       Marland Kitchen alum & mag hydroxide-simeth (MI-ACID) 200-200-20 MG/5ML suspension Take 30 mLs by mouth as needed. Standing order for heartburn /indigestion.Not to exceed 4 doses in 24 hours.      Marland Kitchen aspirin 325 MG tablet Take 325 mg by mouth daily.      Marland Kitchen atenolol (TENORMIN) 50 MG tablet Take 50 mg by mouth daily before breakfast.       . azelastine (OPTIVAR) 0.05 % ophthalmic solution Place 1 drop into both eyes every 12 (twelve) hours.       . cholecalciferol (VITAMIN D) 1000 UNITS tablet Take 1,000 Units by mouth daily.      . Cranberry-Vitamin C-Vitamin E (CRANBERRY PLUS VITAMIN C PO) Take 2 capsules by mouth at bedtime.      . docusate sodium (STOOL SOFTENER) 100 MG capsule Take 100 mg  by mouth 2 (two) times daily.       Marland Kitchen donepezil (ARICEPT) 10 MG tablet Take 10 mg by mouth every evening.       Marland Kitchen guaiFENesin (ROBITUSSIN) 100 MG/5ML liquid Take 200 mg by mouth every 6 (six) hours as needed for cough.      . latanoprost (XALATAN) 0.005 % ophthalmic solution Place 1 drop into both eyes daily.      Marland Kitchen lisinopril (PRINIVIL,ZESTRIL) 10 MG tablet Take 10 mg by mouth daily before breakfast.       . loperamide (IMODIUM A-D) 2 MG tablet Take 2 mg by mouth 4 (four) times daily as needed for diarrhea or loose stools.      . mirtazapine (REMERON) 7.5 MG tablet Take 7.5 mg by mouth at bedtime.      Marland Kitchen omeprazole (PRILOSEC) 20 MG capsule Take 20 mg by mouth every morning.       Bertram Gala Glycol-Propyl Glycol (SYSTANE OP) Place 2 drops into both eyes 4 (four) times daily as needed (itching).       . polyethylene glycol powder (GLYCOLAX/MIRALAX) powder Take 17 g by mouth every other day.  Dissolve in 8 oz of beverage of choice and drink      . potassium chloride (K-DUR,KLOR-CON) 10 MEQ tablet Take 10 mEq by mouth every morning.       . traMADol (ULTRAM) 50 MG tablet Take 1 tablet (50 mg total) by mouth every 6 (six) hours as needed for pain.  15 tablet  0   Facility-Administered Medications Ordered in Other Encounters  Medication Dose Route Frequency Provider Last Rate Last Dose  . 0.45 % sodium chloride infusion   Intravenous Continuous Hillis Range, MD      . 0.9 %  sodium chloride infusion  250 mL Intravenous Continuous Hillis Range, MD      . chlorhexidine (HIBICLENS) 4 % liquid 4 application  60 mL Topical Once Hillis Range, MD      . sodium chloride 0.9 % injection 3 mL  3 mL Intravenous Q12H Hillis Range, MD      . sodium chloride 0.9 % injection 3 mL  3 mL Intravenous PRN Hillis Range, MD       ROS: History obtained from EMS and the patient   General ROS: negative for - chills, fatigue, fever, night sweats, weight gain or weight loss Psychological ROS: negative for - behavioral disorder, hallucinations, memory difficulties, mood swings or suicidal ideation Ophthalmic ROS: negative for - blurry vision, double vision, eye pain or loss of vision ENT ROS: negative for - epistaxis, nasal discharge, oral lesions, sore throat, tinnitus or vertigo Allergy and Immunology ROS: negative for - hives or itchy/watery eyes Hematological and Lymphatic ROS: negative for - bleeding problems, bruising or swollen lymph nodes Endocrine ROS: negative for - galactorrhea, hair pattern changes, polydipsia/polyuria or temperature intolerance Respiratory ROS: negative for - cough, hemoptysis, shortness of breath or wheezing Cardiovascular ROS: negative for - chest pain, dyspnea on exertion, edema or irregular heartbeat Gastrointestinal ROS: negative for - abdominal pain, diarrhea, hematemesis, nausea/vomiting or stool incontinence Genito-Urinary ROS: negative for - dysuria, hematuria,  incontinence or urinary frequency/urgency Musculoskeletal ROS: negative for - joint swelling or muscular weakness Neurological ROS: as noted in HPI Dermatological ROS: negative for rash and skin lesion changes   Physical Examination: SpO2 99.00%.  Neurologic Examination: Mental Status: Patient is alert. She has dementia at baseline. She states her age as 64. The year is [birth year] which is her year of  birth.  Speech fluent without evidence of aphasia.  Able to follow 3 step commands. Cranial Nerves: II: visual fields grossly normal, blinks to threat III,IV, VI: ptosis not present, extra-ocular motions intact bilaterally V,VII: smile asymmetric (initially, then cleared), facial light touch sensation normal bilaterally, she does have ecchymosis on the left orbit/cheek from prior fall this week. VIII: hard of hearing IX,X: gag reflex present XI: trapezius strength/neck flexion strength decreased on right as she has just dislocated the right shoulder with closed reduction, now with swelling and ecchymosis XII: tongue strength normal  Motor: Right : Upper extremity   5/5    Left:     Upper extremity   5/5  Lower extremity   5/5     Lower extremity   4/5 drift Sensory: Pinprick symmetric throughout Plantars: Right: downgoing   Left: downgoing Cerebellar: normal finger-to-nose normal. Gait not tested due to patient safety and recent fall.   Results for orders placed during the hospital encounter of 03/06/13 (from the past 48 hour(s))  CBC     Status: Abnormal   Collection Time    03/06/13  5:18 PM      Result Value Range   WBC 8.7  4.0 - 10.5 K/uL   RBC 3.19 (*) 3.87 - 5.11 MIL/uL   Hemoglobin 10.0 (*) 12.0 - 15.0 g/dL   HCT 16.1 (*) 09.6 - 04.5 %   MCV 92.8  78.0 - 100.0 fL   MCH 31.3  26.0 - 34.0 pg   MCHC 33.8  30.0 - 36.0 g/dL   RDW 40.9  81.1 - 91.4 %   Platelets 216  150 - 400 K/uL  DIFFERENTIAL     Status: None   Collection Time    03/06/13  5:18 PM      Result Value Range    Neutrophils Relative 60  43 - 77 %   Neutro Abs 5.2  1.7 - 7.7 K/uL   Lymphocytes Relative 26  12 - 46 %   Lymphs Abs 2.3  0.7 - 4.0 K/uL   Monocytes Relative 11  3 - 12 %   Monocytes Absolute 1.0  0.1 - 1.0 K/uL   Eosinophils Relative 3  0 - 5 %   Eosinophils Absolute 0.2  0.0 - 0.7 K/uL   Basophils Relative 1  0 - 1 %   Basophils Absolute 0.0  0.0 - 0.1 K/uL  POCT I-STAT, CHEM 8     Status: Abnormal   Collection Time    03/06/13  5:39 PM      Result Value Range   Sodium 141  135 - 145 mEq/L   Potassium 3.2 (*) 3.5 - 5.1 mEq/L   Chloride 103  96 - 112 mEq/L   BUN 24 (*) 6 - 23 mg/dL   Creatinine, Ser 7.82  0.50 - 1.10 mg/dL   Glucose, Bld 956 (*) 70 - 99 mg/dL   Calcium, Ion 2.13  0.86 - 1.30 mmol/L   TCO2 28  0 - 100 mmol/L   Hemoglobin 9.9 (*) 12.0 - 15.0 g/dL   HCT 57.8 (*) 46.9 - 62.9 %   Ct Head (brain) Wo Contrast  03/06/2013  *RADIOLOGY REPORT*  Clinical Data: Left-sided weakness.  Recent fall.  CT HEAD WITHOUT CONTRAST  Technique:  Contiguous axial images were obtained from the base of the skull through the vertex without contrast.  Comparison: 03/04/2013.  Findings: Left frontal subcutaneous hematoma as noted on recent CT. No skull fracture or intracranial hemorrhage.  Significant  small vessel disease type changes without CT evidence of large acute infarct.  Given the degree of white matter type changes, limited for excluding a small acute infarct.  No intracranial mass lesion detected on this unenhanced exam.  Global atrophy without hydrocephalus.  Vascular calcifications.  IMPRESSION:  Left frontal subcutaneous hematoma as noted on recent CT.  No skull fracture or intracranial hemorrhage.  Significant small vessel disease type changes without CT evidence of large acute infarct.  Given the degree of white matter type changes, limited for excluding a small acute infarct.  Global atrophy without hydrocephalus.  Code stroke called to Dr. Amada Jupiter 03/06/2013 5:40 p.m.   Original  Report Authenticated By: Lacy Duverney, M.D.    Assessment: 79 y.o. female with acute left hemiparesis and left facial droop with confusion. She has underlying dementia but her facial droop and speech are back to baseline. She continues to have LLE drift (NIH 1). She will need stroke workup. She is not a tPA candidate due to rapid improvement of symptoms.  Stroke Risk Factors - atrial fibrillation and hypertension  Plan: 1. HgbA1c, fasting lipid panel 2. NO MRI---PT HAS PPM (medtronic) 3. PT consult, OT consult, Speech consult 4. Echocardiogram 5. Carotid dopplers 6. Prophylactic therapy-Antiplatelet med: Plavix - dose 75mg  7. Risk factor modification 8. Telemetry monitoring 9. Frequent neuro checks  Job Founds, MBA, MHA [facility] Pager [phone] 03/06/2013, 5:50 PM  I have seen and evaluated the patient. I have reviewed the above note and made appropriate changes. Rapid improvement in right eye deviation, left sided weakness, and I suspect a left neglect. She is much improved and therefore not a candidate for tPA. I do feel taht she would benefit from a stroke workup, and consideration of anticoagulation.   Ritta Slot, MD [facility] [phone]  If 7pm- 7am, please page neurology on call at [phone].

## 2013-03-07 ENCOUNTER — Inpatient Hospital Stay (HOSPITAL_COMMUNITY): Payer: Medicare Other

## 2013-03-07 ENCOUNTER — Encounter (HOSPITAL_COMMUNITY): Payer: Self-pay | Admitting: *Deleted

## 2013-03-07 DIAGNOSIS — I369 Nonrheumatic tricuspid valve disorder, unspecified: Secondary | ICD-10-CM

## 2013-03-07 DIAGNOSIS — I6350 Cerebral infarction due to unspecified occlusion or stenosis of unspecified cerebral artery: Secondary | ICD-10-CM

## 2013-03-07 LAB — LIPID PANEL
Cholesterol: 103 mg/dL (ref 0–200)
LDL Cholesterol: 50 mg/dL (ref 0–99)
Total CHOL/HDL Ratio: 2.9 RATIO
Triglycerides: 90 mg/dL (ref <150)
VLDL: 18 mg/dL (ref 0–40)

## 2013-03-07 MED ORDER — CLOPIDOGREL BISULFATE 75 MG PO TABS
75.0000 mg | ORAL_TABLET | Freq: Every day | ORAL | Status: DC
  Administered 2013-03-07 – 2013-03-08 (×2): 75 mg via ORAL
  Filled 2013-03-07 (×3): qty 1

## 2013-03-07 MED ORDER — LORAZEPAM 2 MG/ML IJ SOLN
0.5000 mg | Freq: Once | INTRAMUSCULAR | Status: AC
  Administered 2013-03-07: 0.5 mg via INTRAVENOUS
  Filled 2013-03-07: qty 1

## 2013-03-07 MED ORDER — ASPIRIN 81 MG PO CHEW
81.0000 mg | CHEWABLE_TABLET | Freq: Every day | ORAL | Status: DC
  Administered 2013-03-07 – 2013-03-08 (×2): 81 mg via ORAL
  Filled 2013-03-07 (×2): qty 1

## 2013-03-07 NOTE — Progress Notes (Signed)
Patient's granddaughter informed RN of another "eposide" of stroke like symptoms at 27. Upon assessment, patient had left facial droop again and garbled speech. Dr. Mahala Menghini notified and will continue with CT Head and RN will monitor patient for need to be at a higher acuity of care.

## 2013-03-07 NOTE — Progress Notes (Signed)
2200-Pt was pulling off telemetry wires from about 2000 until 2200, yelling profanities, attempting to get out of bed, pulled IV out, and became physically combative with RN when green mits were applied. Distraction was successful for 20 minutes with patient folding washcloths. Pt also medicated with tramadol for pain. No improvement in agitation.  MD notified. Orders received for IV ativan. New IV site obtained and administered. Patient is currently resting comfortably. Will closely monitor patient's neurological status.

## 2013-03-07 NOTE — Progress Notes (Signed)
Pt c/o catheter, pulling at it. Yelled "get this out of me." No order for catheter noted, so this was removed at 2130.

## 2013-03-07 NOTE — Progress Notes (Signed)
Occupational Therapy Note  OT order received and appreciated.  Per PT note, pt is from SNF and returning to SNF and is w/clevel at baseline.  Will defer OT eval to next venue of care at SNF. Acute OT to sign off.  03/07/2013 Cipriano Mile OTR/L Pager [phone] Office [phone]

## 2013-03-07 NOTE — Progress Notes (Signed)
Stroke Team Progress Note  HISTORY  Alison Dodson is an 79 y.o. female who was LKW at 30 on 03/06/2013 at her facility. She was eating with other residents and they notice that her speech was slurred, she appeared confused and she had left sided weakness. EMS was called and she was brought in as CODE STROKE.  She did have a fall within the past week and was seen in the ED. She has ecchymosis on her face and left shoulder (closed reduction). She has a PPM with a history of afib. She is not on coumadin. Records indicate that she has dementia and is also a fall risk. She does take 325mg  aspirin daily.  Patient was not a TPA candidate secondary to a fall on admit with head trauma.   SUBJECTIVE Her granddaughter is at bedside. The patient has resolved her deficit. History of significant dementia, multiple falls, not on coumadin.  OBJECTIVE Most recent Vital Signs: Filed Vitals:   03/07/13 0153 03/07/13 0403 03/07/13 0604 03/07/13 0839  BP: 113/69 112/62 110/66 134/79  Pulse: 60 61 59   Temp: 97.5 F (36.4 C) 97.5 F (36.4 C) 97.5 F (36.4 C)   TempSrc: Oral Oral Oral   Resp: 20 20 16    Height:      Weight:      SpO2: 98% 98% 97%    CBG (last 3)   Recent Labs  03/06/13 1753  GLUCAP 114*    IV Fluid Intake:     MEDICATIONS  . aspirin  325 mg Oral Daily  . atenolol  50 mg Oral QAC breakfast  . cholecalciferol  1,000 Units Oral Daily  . docusate sodium  100 mg Oral BID  . donepezil  10 mg Oral QPM  . heparin  5,000 Units Subcutaneous Q8H  . latanoprost  1 drop Both Eyes Daily  . lisinopril  10 mg Oral QAC breakfast  . mirtazapine  7.5 mg Oral QHS  . olopatadine  1 drop Both Eyes Q12H  . pantoprazole  40 mg Oral Daily  . polyethylene glycol powder  17 g Oral QODAY  . potassium chloride  10 mEq Oral q morning - 10a   PRN:  acetaminophen, alum & mag hydroxide-simeth, guaiFENesin, loperamide, traMADol  Diet:  Cardiac thin liquids Activity:  Up with assistance DVT  Prophylaxis:  Heparin 5000 units sq tid  CLINICALLY SIGNIFICANT STUDIES Basic Metabolic Panel:  Recent Labs Lab 03/04/13 0734 03/06/13 1718 03/06/13 1739  NA 146* 137 141  K 3.0* 3.2* 3.2*  CL 107 101 103  CO2 27 26  --   GLUCOSE 113* 120* 120*  BUN 13 23 24*  CREATININE 0.84 0.92 0.90  CALCIUM 9.6 8.9  --    Liver Function Tests:  Recent Labs Lab 03/04/13 0734 03/06/13 1718  AST 17 21  ALT 7 7  ALKPHOS 105 89  BILITOT 1.0 1.0  PROT 6.9 5.9*  ALBUMIN 3.8 3.0*   CBC:  Recent Labs Lab 03/04/13 0734 03/06/13 1718 03/06/13 1739  WBC 10.4 8.7  --   NEUTROABS 6.9 5.2  --   HGB 14.3 10.0* 9.9*  HCT 41.0 29.6* 29.0*  MCV 92.6 92.8  --   PLT 255 216  --    Coagulation:  Recent Labs Lab 03/06/13 1718  LABPROT 14.3  INR 1.13   Cardiac Enzymes:  Recent Labs Lab 03/06/13 1719  TROPONINI <0.30   Urinalysis:  Recent Labs Lab 03/04/13 0815 03/06/13 2011  COLORURINE YELLOW YELLOW  LABSPEC 1.011  1.019  PHURINE 7.5 5.0  GLUCOSEU NEGATIVE NEGATIVE  HGBUR TRACE* NEGATIVE  BILIRUBINUR NEGATIVE NEGATIVE  KETONESUR NEGATIVE NEGATIVE  PROTEINUR 30* NEGATIVE  UROBILINOGEN 1.0 4.0*  NITRITE NEGATIVE NEGATIVE  LEUKOCYTESUR NEGATIVE NEGATIVE   Lipid Panel    Component Value Date/Time   CHOL 103 03/07/2013 0527   TRIG 90 03/07/2013 0527   HDL 35* 03/07/2013 0527   CHOLHDL 2.9 03/07/2013 0527   VLDL 18 03/07/2013 0527   LDLCALC 50 03/07/2013 0527   HgbA1C  No results found for this basename: HGBA1C    Urine Drug Screen:   No results found for this basename: labopia, cocainscrnur, labbenz, amphetmu, thcu, labbarb    Alcohol Level: No results found for this basename: ETH,  in the last 168 hours  CT of the brain  03/06/2013   Left frontal subcutaneous hematoma as noted on recent CT.  No skull fracture or intracranial hemorrhage.  Significant small vessel disease type changes without CT evidence of large acute infarct.  Given the degree of white matter type changes,  limited for excluding a small acute infarct.  Global atrophy without hydrocephalus.  MRI of the brain    MRA of the brain    2D Echocardiogram    Carotid Doppler  No evidence of hemodynamically significant internal carotid artery stenosis. Vertebral artery flow is antegrade.   CXR    EKG  atrial fibrillation, rate 91.   Therapy Recommendations pending  Physical Exam  General: The patient is alert and cooperative at the time of the examination. Echymosis of the left forehead and periorbital area.  Skin: No significant peripheral edema is noted.   Neurologic Exam  Cranial nerves: Facial symmetry is present. Speech is normal, no aphasia or dysarthria is noted. Extraocular movements are full. Visual fields are full.  Motor: The patient has good strength in all 4 extremities, but the right arm is in a sling.  Coordination: The patient has good finger-nose-finger and heel-to-shin bilaterally, except unable to test the right arm.  Gait and station: The gait was not tested. No drift is seen.  Reflexes: Deep tendon reflexes are symmetric.    ASSESSMENT [name] is a 79 y.o. female presenting with a left hemiparesis. The patient had a clinical TIA.  On aspirin 325 mg orally every day prior to admission. Now on clopidogrel 75 mg orally every day for secondary stroke prevention.   Consider aspirin and plavix combination therapy, as patient has AFIB, and cannot be anticoagulated.    2 D echo is pending  PT, OT to see  Stroke team will follow  Hospital day # 1  TREATMENT/PLAN  See above.  Annie Main, MSN, RN, ANVP-BC, ANP-BC, [facility] Pager: [phone] 03/07/2013 10:56 AM  I have personally obtained a history, examined the patient, evaluated imaging results, and formulated the assessment and plan of care. I agree with the above. Lesly Dukes

## 2013-03-07 NOTE — Evaluation (Signed)
Physical Therapy Evaluation Patient Details Name: Alison Dodson MRN: [id number] DOB: 01-Aug-1928 Today's Date: 03/07/2013 Time: 7829-5621 PT Time Calculation (min): 28 min  PT Assessment / Plan / Recommendation Clinical Impression  pt admitted for stroke work up, but s/s of speech difficulty and L sided weakness resolved before work up initiated..On eval, pt restless wanting to get the sling off her arm, c/o R shd pain, but generally moving with little assistance.  Pt's family seem disinterested in fostering any mobility up on her feet as she is at extremely  high risk for falls and is not allowed to walk at [facility].  At this time, pt is moving well enough to be easily assisted at [facility] and we do not have anything to offer her on acute at this time.  Will sign off at this time.    PT Assessment  Patent does not need any further PT services    Follow Up Recommendations  No PT follow up;Supervision/Assistance - 24 hour    Does the patient have the potential to tolerate intense rehabilitation      Barriers to Discharge        Equipment Recommendations  None recommended by PT    Recommendations for Other Services     Frequency      Precautions / Restrictions Precautions Precautions: Fall   Pertinent Vitals/Pain       Mobility  Bed Mobility Bed Mobility: Supine to Sit;Sit to Supine;Sitting - Scoot to Edge of Bed Supine to Sit: 3: Mod assist Sitting - Scoot to Edge of Bed: 4: Min assist Sit to Supine: 4: Min assist Details for Bed Mobility Assistance: vc/tc's to direct care and redirect to task. Transfers Transfers: Sit to Stand;Stand to Sit Sit to Stand: 4: Min assist;From bed Stand to Sit: 4: Min guard;To bed Details for Transfer Assistance: vc/tc's for direction, safety Ambulation/Gait Ambulation/Gait Assistance: 4: Min assist Ambulation Distance (Feet): 2 Feet (sidestepping up to [facility]) Assistive device: 1 person hand held assist Stairs: No    Exercises     PT Diagnosis:     PT Problem List:   PT Treatment Interventions:     PT Goals    Visit Information  Last PT Received On: 03/07/13 Assistance Needed: +1    Subjective Data  Subjective: I want this thing off.. I don't like it...give me a knife Patient Stated Goal: pt unable to participate in goal setting   Prior Functioning  Home Living Available Help at Discharge: Skilled Nursing Facility Type of Home: Skilled Nursing Facility Prior Function Level of Independence: Needs assistance Driving: No Comments: can scoot around the unit in her w/c well and has a seat belt on the chair so she will not get up Communication Communication: No difficulties    Cognition  Cognition Arousal/Alertness: Awake/alert Behavior During Therapy: Restless Overall Cognitive Status: History of cognitive impairments - at baseline    Extremity/Trunk Assessment Right Lower Extremity Assessment RLE ROM/Strength/Tone: [facility] for tasks assessed Left Lower Extremity Assessment LLE ROM/Strength/Tone: [facility] for tasks assessed Trunk Assessment Trunk Assessment: Normal   Balance Balance Balance Assessed: Yes Static Standing Balance Static Standing - Balance Support: During functional activity;No upper extremity supported Static Standing - Level of Assistance: 4: Min assist  End of Session PT - End of Session Equipment Utilized During Treatment: Other (comment) (sling) Activity Tolerance: Patient tolerated treatment well Patient left: in bed;with call bell/phone within reach;with family/visitor present Nurse Communication: Mobility status  GP     Garlan Drewes, Eliseo Gum 03/07/2013, 2:02  PM 03/07/2013  [place] Bing, PT [phone] (pager)

## 2013-03-07 NOTE — Progress Notes (Signed)
NP, Craige Cotta notified of urine output of from 2130 until 0600. No response. Passed along to day RN.

## 2013-03-07 NOTE — Progress Notes (Signed)
Orthopedic Tech Progress Note Patient Details:  Alison Dodson 11-25-1927 [id number] Sling immobilizer ordered for patient. Patient not in room when immobilizer was delivered. Family stated they had taken patient for tests. Immobilizer left in room for application upon return. Spoke with nurse. Nurse to apply sling immobilizer upon patient return.  Ortho Devices Type of Ortho Device: Sling immobilizer Ortho Device/Splint Interventions: Ordered   [place] R Thompson 03/07/2013, 10:42 AM

## 2013-03-07 NOTE — Progress Notes (Signed)
Notified by patient's granddaughter (who has been at pt's beside most of the day) that the patient was having increased left facial droop and slurred speech beginning around 1648. Upon assessment, the patient does have a greater facial droop on the left side and is slurring her speech, which is different from earlier. Patient is able to move all extremities as before and strength is equal bilaterally. Dr. Mahala Menghini notified  And visited patient on unit. Stat CT head ordered will continue to monitor.

## 2013-03-07 NOTE — Evaluation (Addendum)
Speech Language Pathology Evaluation Patient Details Name: Alison Dodson MRN: [id number] DOB: 28-Mar-1928 Today's Date: 03/07/2013 Time: 4098-1191 SLP Time Calculation (min): 21 min  Problem List:  Patient Active Problem List  Diagnosis  . Essential hypertension, benign  . Atrial fibrillation  . BRADYCARDIA-TACHYCARDIA SYNDROME  . Dizzy  . Prerenal azotemia  . Alzheimer's dementia  . UTI (lower urinary tract infection)  . TIA (transient ischemic attack)   Past Medical History:  Past Medical History  Diagnosis Date  . Anxiety   . Coronary artery disease   . Depression   . Hypertension   . Tachy-brady syndrome   . Permanent atrial fibrillation   . HX: anticoagulation   . Glaucoma(365)   . Dizziness   . SSS (sick sinus syndrome)   . GERD (gastroesophageal reflux disease)   . Fall   . Osteoarthritis   . Dementia    Past Surgical History:  Past Surgical History  Procedure Laterality Date  . Insert / replace / remove pacemaker  01/24/2008    DDD PACEMAKER IMPLANT  . Cholecystectomy    . Gastric bypass    . Breast reduction surgery    . US echocardiography  08/10/2007    EF 60%  . US echocardiography  01/12/2007    EF 70%  . Cardiovascular stress test  02/15/2007  . Total knee arthroplasty      right  . Pacemaker insertion     HPI:  79 yo female adm to [facility] after having episode of left facial droop, right sided gaze during meal at [facility] - ? TIA vs CVA. Pt fell 2 days prior to admission.  PMH + for dementia, GERD, etc and she resides at [facility] per RN.  SLE ordered.    Per head CT 03/06/13 , Left frontal subcutaneous hematoma as noted on recent CT.  No skull fx or hemmorage.  Global atrophy noted.     Assessment / Plan / Recommendation Clinical Impression  Pt presents with severe cognitive deficits with decreased attention, recall of immediate information, problem solving ability and disorientation.  Pt's speech is clear and fluent for basic conversation.  Unfortunately  family not present to establish baseline, uncertain if fall has precipitated cognitive loss.  Pt repeatedly asked the same questions (where am I? is this new? who is my doctor? what happened to me?) without ability to retain answer within 10 seconds.  Use of call bell demonstrated x3, but pt unforunately not able to return demonstration within 10 seconds.  Pt will require 24/7 assist upon dc and currently has level of support needed in this environment.  If noted persistant worsening cognitive deficits at SNF, please order SLP in hopes to recovery cognition to baseline.  SLP to sign off.      SLP Assessment  Patient does not need any further Speech Lanaguage Pathology Services    Follow Up Recommendations    at SNF if worsening cognitive deficits resulted from fall ? TIA   Frequency and Duration   n/a     Pertinent Vitals/Pain Afebrile, decreased   SLP Goals   n/a  SLP Evaluation Prior Functioning  Cognitive/Linguistic Baseline: Baseline deficits Baseline deficit details: pt has baseline dementia, uncertain of level Type of Home: Skilled Nursing Facility (? SNF vs ALF)   Cognition  Overall Cognitive Status: No family/caregiver present to determine baseline cognitive functioning Arousal/Alertness: Awake/alert Orientation Level: Oriented to person;Disoriented to place;Disoriented to time;Disoriented to situation Attention: Sustained;Focused Focused Attention: Appears intact Sustained Attention: Impaired Sustained  Attention Impairment: Verbal basic;Functional basic Memory: Impaired Memory Impairment: Storage deficit;Retrieval deficit;Decreased recall of new information Awareness: Impaired Awareness Impairment: Intellectual impairment Problem Solving: Impaired Problem Solving Impairment: Functional basic (cannot recall to use call bell for assist) Safety/Judgment: Impaired    Comprehension  Auditory Comprehension Overall Auditory Comprehension: Appears within functional limits for  tasks assessed (for basic communication, cognition impacts comprehension) Conversation: Simple Visual Recognition/Discrimination Discrimination: Not tested Reading Comprehension Reading Status:  (pt read calendar, needed cues to attend to April)    Expression Expression Primary Mode of Expression: Verbal Verbal Expression Overall Verbal Expression: Impaired (suspect impaired at baseline) Initiation: No impairment Level of Generative/Spontaneous Verbalization: Conversation Naming: Impairment Confrontation: Impaired Other Naming Comments: naming pictures 3/5 correct - able to describe purpose of object Pragmatics: No impairment Written Expression Dominant Hand:  (unknown)   Oral / Motor Oral Motor/Sensory Function Overall Oral Motor/Sensory Function: Appears within functional limits for tasks assessed Motor Speech Overall Motor Speech: Appears within functional limits for tasks assessed   GO    Donavan Burnet, MS [facility] SLP [phone]

## 2013-03-07 NOTE — ED Provider Notes (Signed)
I saw and evaluated the patient, reviewed the resident's note and I agree with the findings and plan.   Rolan Bucco, MD 03/07/13 [phone]

## 2013-03-07 NOTE — Progress Notes (Signed)
  Echocardiogram 2D Echocardiogram has been performed.  Georgian Co 03/07/2013, 11:10 AM

## 2013-03-07 NOTE — Progress Notes (Signed)
Called by nursing x 2 about patient's changes in terms of slurred speech and also twisting of mouth to left.  First episode at 16:35, 2nd episode 17:33 Resolution of symptoms in seconds-to Dr. Amada Jupiter of neurology who recommends repeat CT scan but patient is outside window TPA, patient's had a recent subdural hemorrhage, and patient is a poor anticoagulation candidate. I had a long discussion with his daughter Darel Hong who states patient is a DO NOT RESUSCITATE but temporarily she wants this rescinded patient's respiratory status becomes tenuous and patient should be intubated if this occurs.  Pleas Koch, MD [facility] [phone]

## 2013-03-07 NOTE — Progress Notes (Signed)
Utilization review completed. Cianna Kasparian, RN, BSN. 

## 2013-03-07 NOTE — Progress Notes (Signed)
*  PRELIMINARY RESULTS* Vascular Ultrasound Carotid Duplex (Doppler) has been completed.  Preliminary findings: Bilateral:  No evidence of hemodynamically significant internal carotid artery stenosis.   Vertebral artery flow is antegrade.      Farrel Demark, RDMS, RVT  03/07/2013, 11:16 AM

## 2013-03-07 NOTE — Progress Notes (Signed)
PROGRESS NOTE  Alison Dodson ZOX:[id number] DOB: 20-Apr-1928 DOA: 03/06/2013 PCP: Gaspar Garbe, MD  Brief narrative: History of female admitted 03/06/2013 with left-sided weakness and speech difficulty while eating dinner at her assisted-living facility [facility] [[address]]  Past medical history-As per Problem list Chart reviewed as below- Seen in the emergency room 03/04/2013 fall-shoulder dislocation was performed under conscious sedation Pacemaker generator replacement 01/29/2013 [employer] Brunswick model [place] (serial number [id number]) Seen in emergency room 10/09/2012 for fall noted suprapubic pain and pubic ramus fracture which was managed supportively at nursing home Admission 07/28/2029 for fall without fracture Admission 04/10/2012 for fall-she had a right subcapital hip fracture-nonsurgical approach was recommended that Admission 06/06/2011 for right knee osteoarthritis status post right total knee replacement Admission 04/07/1999 and osteoarthritis left greater than right history CAD, history DDDR pacer Showed 01/24/2007 sick sinus syndrome/tachybradycardia a placement of dual-chamber PPI Admission 05/01/2003 paroxysmal A. fib  Admission 02/08/2007 for chest pain atypical nature  Consultants:  Neurology  Procedures:  CT head 03/06/2013 = left frontal subcutaneous hematoma as a recent CT no skull fracture intracranial hemorrhage-significant small vessel changes without CT evidence of large infarct-limited for excluded small acute infarct  Antibiotics:  none   Subjective   Alert but not oriented. Somewhat confused. Emotional. Careful. Doesn't know why he she cannot move her right upper extremity She is unaware as towhere she is    Objective    Interim History: Per SLP 4.17.14 severe cognitive deficits with decreased attention, recall of immediate information, problem solving ability and disorientation. Pt's speech is clear and fluent for basic  conversation. Unfortunately family not present to establish baseline, uncertain if fall has precipitated cognitive loss. Pt repeatedly asked the same questions (where am I? is this new? who is my doctor? what happened to me?) without ability to retain answer within 10 seconds. Use of call bell demonstrated x3, but pt unforunately not able to return demonstration within 10 seconds. Pt will require 24/7 assist upon dc and currently has level of support needed in this environment. If noted persistant worsening cognitive deficits at SNF, please order SLP in hopes to recovery cognition to baseline. SLP to sign off   Telemetry: Paced rhythm  Objective: Filed Vitals:   03/06/13 2352 03/07/13 0153 03/07/13 0403 03/07/13 0604  BP: 126/71 113/69 112/62 110/66  Pulse: 67 60 61 59  Temp: 98.1 F (36.7 C) 97.5 F (36.4 C) 97.5 F (36.4 C) 97.5 F (36.4 C)  TempSrc: Oral Oral Oral Oral  Resp: 20 20 20 16   Height:      Weight:      SpO2: 99% 98% 98% 97%    Intake/Output Summary (Last 24 hours) at 03/07/13 0842 Last data filed at 03/07/13 0600  Gross per 24 hour  Intake    225 ml  Output      0 ml  Net    225 ml    Exam:  General: Hematoma over left side of face with bruise.  Confused and labile Cardiovascular: s1 s2 no m/r/g-PAcemaker noted Respiratory:  clear Abdomen: soft NT/ND Skin Hematoma over R arm with limited ROM Neuro grossly intact-no slurred speech, no wekaness to face.  Moving both LE's  Data Reviewed: Basic Metabolic Panel:  Recent Labs Lab 03/04/13 0734 03/06/13 1718 03/06/13 1739  NA 146* 137 141  K 3.0* 3.2* 3.2*  CL 107 101 103  CO2 27 26  --   GLUCOSE 113* 120* 120*  BUN 13 23 24*  CREATININE 0.84 0.92 0.90  CALCIUM 9.6 8.9  --    Liver Function Tests:  Recent Labs Lab 03/04/13 0734 03/06/13 1718  AST 17 21  ALT 7 7  ALKPHOS 105 89  BILITOT 1.0 1.0  PROT 6.9 5.9*  ALBUMIN 3.8 3.0*   No results found for this basename: LIPASE, AMYLASE,  in the  last 168 hours No results found for this basename: AMMONIA,  in the last 168 hours CBC:  Recent Labs Lab 03/04/13 0734 03/06/13 1718 03/06/13 1739  WBC 10.4 8.7  --   NEUTROABS 6.9 5.2  --   HGB 14.3 10.0* 9.9*  HCT 41.0 29.6* 29.0*  MCV 92.6 92.8  --   PLT 255 216  --    Cardiac Enzymes:  Recent Labs Lab 03/06/13 1719  TROPONINI <0.30   BNP: No components found with this basename: POCBNP,  CBG:  Recent Labs Lab 03/06/13 1753  GLUCAP 114*    Recent Results (from the past 240 hour(s))  URINE CULTURE     Status: None   Collection Time    03/04/13  8:15 AM      Result Value Range Status   Specimen Description URINE, CATHETERIZED   Final   Special Requests NONE   Final   Culture  Setup Time 03/04/2013 09:16   Final   Colony Count NO GROWTH   Final   Culture NO GROWTH   Final   Report Status 03/05/2013 FINAL   Final     Studies:              All Imaging reviewed and is as per above notation   Scheduled Meds: . aspirin  325 mg Oral Daily  . atenolol  50 mg Oral QAC breakfast  . cholecalciferol  1,000 Units Oral Daily  . docusate sodium  100 mg Oral BID  . donepezil  10 mg Oral QPM  . heparin  5,000 Units Subcutaneous Q8H  . latanoprost  1 drop Both Eyes Daily  . lisinopril  10 mg Oral QAC breakfast  . mirtazapine  7.5 mg Oral QHS  . olopatadine  1 drop Both Eyes Q12H  . pantoprazole  40 mg Oral Daily  . polyethylene glycol powder  17 g Oral QODAY  . potassium chloride  10 mEq Oral q morning - 10a   Continuous Infusions:    Assessment/Plan: 1. Small stroke vs TIA-patient to be on Plavix 75 mg [was on asa 3245 prior]  Further work-up pending including Carotid dopplers/Echo-Unclear if needs CTA brain 2. Afib s/p PPM replacement 01/29/13-Currently not on Anticoagulation 2/2 to frequent falls. Continue Atenolol 50 q am  3. Frequent falls- Check orthostatics as is on Atenolol 4. H/o Tachy-brady syndrome-See above 5. End stage dementia-continue Aricept 10  qpm.  Continue Remeron 7.5 qhs 6. Constipation/diarrhea-on both Miralax and loperamide.  Will hold both.  Continue Colace 100 bid 7. Hypokalemia-mild.  Rpt bmet am 8. HLD-LDL 50  Code Status: FUll Family Communication:  None at bedside Disposition Plan: inpatient   Pleas Koch, MD  [facility] Pager [phone] 03/07/2013, 8:42 AM    LOS: 1 day

## 2013-03-08 LAB — CBC
MCH: 31.5 pg (ref 26.0–34.0)
MCV: 92.9 fL (ref 78.0–100.0)
Platelets: 260 10*3/uL (ref 150–400)
RDW: 14 % (ref 11.5–15.5)

## 2013-03-08 MED ORDER — CLOPIDOGREL BISULFATE 75 MG PO TABS: 75.0000 mg | ORAL_TABLET | Freq: Every day | ORAL | Status: DC

## 2013-03-08 MED ORDER — ASPIRIN 81 MG PO CHEW: 81.0000 mg | CHEWABLE_TABLET | Freq: Every day | ORAL | Status: DC

## 2013-03-08 NOTE — Clinical Social Work Psychosocial (Signed)
Clinical Social Work Department BRIEF PSYCHOSOCIAL ASSESSMENT 03/08/2013  Patient:  Alison Dodson,Alison Dodson     Account Number:  [id number]     Admit date:  03/06/2013  Clinical Social Worker:  Peggyann Shoals  Date/Time:  03/08/2013 01:10 PM  Referred by:  Physician  Date Referred:  03/07/2013 Referred for  Other - See comment   Other Referral:   Admitted from a facility   Interview type:  Family Other interview type:    PSYCHOSOCIAL DATA Living Status:  FACILITY Admitted from facility:  [facility] Level of care:  Assisted Living Primary support name:  Liliane Channel Primary support relationship to patient:  CHILD, ADULT Degree of support available:   supportive.    CURRENT CONCERNS Current Concerns  Post-Acute Placement   Other Concerns:    SOCIAL WORK ASSESSMENT / PLAN CSW met with pt and family to address cosnult for SNF. CSW introduced herself and explained role of social work. CSW also explained the process of discharging and returning to ALF. Pt is a resident of [facility]. Pt and family are agreeable to discharge plan. Pt'Dodson daughter would like for pt to be transported by [facility] and understand there could be an added cost. CSW contacted [facility] and sent FL2, Discharge Summary, and AVS for review. CSW is waiting for response to move forward with discharge. CSW will continue to follow.   Assessment/plan status:  Psychosocial Support/Ongoing Assessment of Needs Other assessment/ plan:   Information/referral to community resources:   Pt is a resident of [facility]   Dede Query, MSW, [place] #[phone]

## 2013-03-08 NOTE — Care Management Note (Signed)
    Page 1 of 2   03/08/2013     2:49:52 PM   CARE MANAGEMENT NOTE 03/08/2013  Patient:  Nevitt,Leolia S   Account Number:  [id number]  Date Initiated:  03/08/2013  Documentation initiated by:  [facility]  Subjective/Objective Assessment:   admitted with TIA from ALF     Action/Plan:   PT/OT evals  plan return to ALF   Anticipated DC Date:  03/08/2013   Anticipated DC Plan:  ASSISTED LIVING / REST HOME  In-house referral  Clinical Social Worker      DC Associate Professor  CM consult      [facility] Choice  Resumption Of Svcs/PTA Provider   Choice offered to / List presented to:          [facility] arranged  HH-1 RN  [employer] PT      [facility] agency  [employer]   Status of service:  Completed, signed off Medicare Important Message given?   (If response is "NO", the following Medicare IM given date fields will be blank) Date Medicare IM given:   Date Additional Medicare IM given:    Discharge Disposition:  ASSISTED LIVING  Per UR Regulation:  Reviewed for med. necessity/level of care/duration of stay  If discussed at Long Length of Stay Meetings, dates discussed:    Comments:  03/08/13 Contacted Debbie with [facility] and informed her of patient's d/c with order to resume HHRN and HHPT. [facility] will resume services at ALF. Jacquelynn Cree RN, BSN, CCM

## 2013-03-08 NOTE — Care Management (Signed)
This is an active pt with [place] [facility] at the [facility].  Pt will need resumption of care orders at DC.  Alison Dodson was providing Physical Therapy prior to her admission.  She will cross her cert period and will require a Face to Face at DC. Thank You  Venia Minks, [phone]

## 2013-03-08 NOTE — Progress Notes (Signed)
Stroke Team Progress Note  HISTORY  Alison Dodson is an 79 y.o. female who was LKW at 82 on 03/06/2013 at her facility. She was eating with other residents and they notice that her speech was slurred, she appeared confused and she had left sided weakness. EMS was called and she was brought in as CODE STROKE.  She did have a fall within the past week and was seen in the ED. She has ecchymosis on her face and left shoulder (closed reduction). She has a PPM with a history of afib. She is not on coumadin. Records indicate that she has dementia and is also a fall risk. She does take 325mg  aspirin daily.  Patient was not a TPA candidate secondary to a fall on admit with head trauma.   SUBJECTIVE Long talk with the patient's son today regarding ongoing TIA like symptoms. He reports the patient had been on Coumadin in the past. This apparently was discontinued secondary to falls. We discussed the risks and benefits of anticoagulation. He lives in [place], [place] and travels extensively. He would like to be contacted once treatment decisions have been made. His name is Alene Mires and his phone number is [phone].  OBJECTIVE Most recent Vital Signs: Filed Vitals:   03/07/13 2213 03/08/13 0248 03/08/13 0541 03/08/13 0918  BP: 151/91 137/76 130/80 141/93  Pulse: 75   72  Temp: 97.2 F (36.2 C) 98.1 F (36.7 C) 97.3 F (36.3 C) 97.9 F (36.6 C)  TempSrc: Oral Oral Oral   Resp: 20 18  20   Height:      Weight:      SpO2:  95% 99% 98%   CBG (last 3)   Recent Labs  03/06/13 1753  GLUCAP 114*    IV Fluid Intake:     MEDICATIONS  . aspirin  81 mg Oral Daily  . atenolol  50 mg Oral QAC breakfast  . cholecalciferol  1,000 Units Oral Daily  . clopidogrel  75 mg Oral Q breakfast  . docusate sodium  100 mg Oral BID  . donepezil  10 mg Oral QPM  . heparin  5,000 Units Subcutaneous Q8H  . latanoprost  1 drop Both Eyes Daily  . lisinopril  10 mg Oral QAC breakfast  .  mirtazapine  7.5 mg Oral QHS  . olopatadine  1 drop Both Eyes Q12H  . pantoprazole  40 mg Oral Daily  . potassium chloride  10 mEq Oral q morning - 10a   PRN:  acetaminophen, alum & mag hydroxide-simeth, guaiFENesin, traMADol  Diet:  Cardiac thin liquids Activity:  Up with assistance DVT Prophylaxis:  Heparin 5000 units sq tid  CLINICALLY SIGNIFICANT STUDIES Basic Metabolic Panel:   Recent Labs Lab 03/04/13 0734 03/06/13 1718 03/06/13 1739  NA 146* 137 141  K 3.0* 3.2* 3.2*  CL 107 101 103  CO2 27 26  --   GLUCOSE 113* 120* 120*  BUN 13 23 24*  CREATININE 0.84 0.92 0.90  CALCIUM 9.6 8.9  --    Liver Function Tests:   Recent Labs Lab 03/04/13 0734 03/06/13 1718  AST 17 21  ALT 7 7  ALKPHOS 105 89  BILITOT 1.0 1.0  PROT 6.9 5.9*  ALBUMIN 3.8 3.0*   CBC:   Recent Labs Lab 03/04/13 0734 03/06/13 1718 03/06/13 1739  WBC 10.4 8.7  --   NEUTROABS 6.9 5.2  --   HGB 14.3 10.0* 9.9*  HCT 41.0 29.6* 29.0*  MCV 92.6 92.8  --  PLT 255 216  --    Coagulation:   Recent Labs Lab 03/06/13 1718  LABPROT 14.3  INR 1.13   Cardiac Enzymes:   Recent Labs Lab 03/06/13 1719  TROPONINI <0.30   Urinalysis:   Recent Labs Lab 03/04/13 0815 03/06/13 2011  COLORURINE YELLOW YELLOW  LABSPEC 1.011 1.019  PHURINE 7.5 5.0  GLUCOSEU NEGATIVE NEGATIVE  HGBUR TRACE* NEGATIVE  BILIRUBINUR NEGATIVE NEGATIVE  KETONESUR NEGATIVE NEGATIVE  PROTEINUR 30* NEGATIVE  UROBILINOGEN 1.0 4.0*  NITRITE NEGATIVE NEGATIVE  LEUKOCYTESUR NEGATIVE NEGATIVE   Lipid Panel    Component Value Date/Time   CHOL 103 03/07/2013 0527   TRIG 90 03/07/2013 0527   HDL 35* 03/07/2013 0527   CHOLHDL 2.9 03/07/2013 0527   VLDL 18 03/07/2013 0527   LDLCALC 50 03/07/2013 0527   HgbA1C  Lab Results  Component Value Date   HGBA1C 5.1 03/07/2013    Urine Drug Screen:   No results found for this basename: labopia,  cocainscrnur,  labbenz,  amphetmu,  thcu,  labbarb    Alcohol Level: No results  found for this basename: ETH,  in the last 168 hours  CT of the brain  03/06/2013   Left frontal subcutaneous hematoma as noted on recent CT.  No skull fracture or intracranial hemorrhage.  Significant small vessel disease type changes without CT evidence of large acute infarct.  Given the degree of white matter type changes, limited for excluding a small acute infarct.  Global atrophy without hydrocephalus.  CT of the brain 03/07/2013 - No acute intracranial abnormality. No intracranial change since the prior exam   MRI of the brain  patient has a permanent pacemaker  MRA of the brain  patient has a permanent pacemaker]  2D Echocardiogram  ejection fraction 55-60%. No source of cardiac emboli was identified.  Carotid Doppler  No evidence of hemodynamically significant internal carotid artery stenosis. Vertebral artery flow is antegrade.   CXR   03/04/13 1. Apparent anterior dislocation of the right humeral head.  Correlate clinically.  2. Stable cardiomegaly with permanent pacemaker.  EKG  atrial fibrillation, rate 91.   Therapy Recommendations no followup physical therapy recommended. 24-hour per day supervision was recommended.   Neurologic Exam  Cranial nerves: Facial symmetry is present. Speech is normal, no aphasia or dysarthria is noted. Extraocular movements are full. Visual fields are full. The patient is oriented to person only. Seems confused.  Motor: The patient has good strength in all 4 extremities, but the right arm is in a sling.  Coordination: The patient has good finger-nose-finger and heel-to-shin bilaterally, except unable to test the right arm.  Gait and station: The gait was not tested. No drift is seen.  Reflexes: Deep tendon reflexes are symmetric.    ASSESSMENT Alison Dodson is a 79 y.o. female presenting with a left hemiparesis. The patient had a clinical TIA.  On aspirin 325 mg orally every day prior to admission. Now on clopidogrel 75 mg orally  every day and aspirin 81 mg for secondary stroke prevention.   Therapy Recommendations - no followup physical therapy recommended. 24-hour per day supervision was recommended.  Stroke team will follow - dysarthria and left facial weakness yesterday. Repeat CT of the head showed no acute change.  Progressive drop in hemoglobin with increased BUN. Will need stool guiacs. Repeat CBC today.  Hypokalemia - on supplementation.  Atrial fibrillation history  Permanent pacemaker.   Hospital day # 2  The patient had confusion and aggitation last  PM, still confused this AM, may have a UTI.   TREATMENT/PLAN   Check UA today, possible UTI  Delton See PA-C [facility] Pager [phone] 03/08/2013, 9:45 AM  I have personally obtained a history, examined the patient, evaluated imaging results, and formulated the assessment and plan of care. I agree with the above. Lesly Dukes

## 2013-03-08 NOTE — Discharge Summary (Signed)
Physician Discharge Summary  Alison Dodson WUJ:[id number] DOB: [birth year] DOA: 03/06/2013  PCP: Gaspar Garbe, MD  Admit date: 03/06/2013 Discharge date: 03/08/2013  Time spent: 45 minutes  Recommendations for Outpatient Follow-up:  1. Follow up with PCP in 1 month 2. Needs q 30 minute checks at memory care unit 3. Recommend using mild anxiolytic or Risperdal for agitation-try to avoid BZD's 4. Follow with Dr. Magnus Ivan for out-patient re-assesment of shoulder dislocation-needs sling  Discharge Diagnoses:  Principal Problem:   TIA (transient ischemic attack) Active Problems:   Essential hypertension, benign   Atrial fibrillation   Alzheimer's dementia   Discharge Condition: good  Diet recommendation: heart healthy-might need assistance with feeding  Filed Weights   03/06/13 2146  Weight: 67 kg (147 lb 11.3 oz)    History of present illness:  79 yr old female with h/o Afib s/p PPM-not on coumadin 2/2 to high fall risk, Advanced dementia presented 4.16 with slurring of speech and drooping of mouth at her facility-[facility] [[address]]-she had been seen just 2 days prior in ED for fall with resultant R shoulder disloaction that was reduced.  tPA not admin givne recent fall with left frontal hematoma  Hospital Course:  Assessment/Plan:  1. Small stroke vs TIA-patient to be on Plavix 75 mg [was on asa 325 prior] Further work-up pending including Carotid dopplers/Echo performed and showed no new changes.  She had 2 further episodes of brief slurring of speech and twisting of mouth on 4.17 but this wouldn't change clinical course or Rx.  She is at very high risk for life threatening bleed on Anticoagulant and neurology concurred dual anti-platelet therapy was probably the best option 2. Afib s/p PPM replacement 01/29/13-Currently not on Anticoagulation 2/2 to frequent falls. Continue Atenolol 50 q am  3. Frequent falls- orthostatics were neg  4. Recent shoulder  dislocation-follow as an out-patient with Dr. Magnus Ivan 5. H/o Tachy-brady syndrome-See above 6. End stage dementia-continue Aricept 10 qpm. Continue Remeron 7.5 qhs 7. Constipation/diarrhea-on both Miralax and loperamide. Will hold both. Continue Colace 100 bid 8. Hypokalemia-mild. Rpt bmet am 9. HLD-LDL 50    Procedures:  CT head numerous times   Echo   Carotids  Consultations:  Neruology  Discharge Exam: Filed Vitals:   03/07/13 2213 03/08/13 0248 03/08/13 0541 03/08/13 0918  BP: 151/91 137/76 130/80 141/93  Pulse: 75   72  Temp: 97.2 F (36.2 C) 98.1 F (36.7 C) 97.3 F (36.3 C) 97.9 F (36.6 C)  TempSrc: Oral Oral Oral   Resp: 20 18  20   Height:      Weight:      SpO2:  95% 99% 98%    Disoriented, pleasant  General: hematoma to L face improving Cardiovascular:  s1 s2 no m/r/g-paced rythmn Respiratory: clear  Discharge Instructions  Discharge Orders   Future Appointments Provider Department Dept Phone   05/16/2013 3:00 PM Hillis Range, MD [facility] Main Office [place]) [phone]   Future Orders Complete By Expires     Diet - low sodium heart healthy  As directed     Increase activity slowly  As directed         Medication List    STOP taking these medications       aspirin 325 MG tablet      TAKE these medications       acetaminophen 500 MG tablet  Commonly known as:  TYLENOL  Take 500 mg by mouth every 4 (four) hours as needed for fever.  aspirin 81 MG chewable tablet  Chew 1 tablet (81 mg total) by mouth daily.     atenolol 50 MG tablet  Commonly known as:  TENORMIN  Take 50 mg by mouth daily before breakfast.     azelastine 0.05 % ophthalmic solution  Commonly known as:  OPTIVAR  Place 1 drop into both eyes every 12 (twelve) hours.     cholecalciferol 1000 UNITS tablet  Commonly known as:  VITAMIN D  Take 1,000 Units by mouth daily.     clopidogrel 75 MG tablet  Commonly known as:  PLAVIX  Take 1 tablet (75 mg  total) by mouth daily with breakfast.     CRANBERRY PLUS VITAMIN C PO  Take 2 capsules by mouth at bedtime.     donepezil 10 MG tablet  Commonly known as:  ARICEPT  Take 10 mg by mouth every evening.     latanoprost 0.005 % ophthalmic solution  Commonly known as:  XALATAN  Place 1 drop into both eyes daily.     lisinopril 10 MG tablet  Commonly known as:  PRINIVIL,ZESTRIL  Take 10 mg by mouth daily before breakfast.     mirtazapine 7.5 MG tablet  Commonly known as:  REMERON  Take 7.5 mg by mouth at bedtime.     omeprazole 20 MG capsule  Commonly known as:  PRILOSEC  Take 20 mg by mouth every morning.     polyethylene glycol powder powder  Commonly known as:  GLYCOLAX/MIRALAX  Take 17 g by mouth every other day. Dissolve in 8 oz of beverage of choice and drink     potassium chloride 10 MEQ tablet  Commonly known as:  K-DUR,KLOR-CON  Take 10 mEq by mouth every morning.     STOOL SOFTENER 100 MG capsule  Generic drug:  docusate sodium  Take 100 mg by mouth 2 (two) times daily.     traMADol 50 MG tablet  Commonly known as:  ULTRAM  Take 1 tablet (50 mg total) by mouth every 6 (six) hours as needed for pain.          The results of significant diagnostics from this hospitalization (including imaging, microbiology, ancillary and laboratory) are listed below for reference.    Significant Diagnostic Studies: Ct Head Wo Contrast  03/07/2013  *RADIOLOGY REPORT*  Clinical Data: Transient ischemic attack. Slurred speech.  CT HEAD WITHOUT CONTRAST  Technique:  Contiguous axial images were obtained from the base of the skull through the vertex without contrast.  Comparison: 03/06/2013  Findings: There is no acute intracranial hemorrhage, infarction, or mass lesion.  Diffuse cerebral cortical atrophy with secondary ventricular dilatation.  Chronic periventricular white matter lucency, unchanged.  Left frontal scalp hematoma has diminished in size.  No acute osseous abnormality.   IMPRESSION: No acute intracranial abnormality.  No intracranial change since the prior exam.   Original Report Authenticated By: Francene Boyers, M.D.    Ct Head (brain) Wo Contrast  03/06/2013  *RADIOLOGY REPORT*  Clinical Data: Left-sided weakness.  Recent fall.  CT HEAD WITHOUT CONTRAST  Technique:  Contiguous axial images were obtained from the base of the skull through the vertex without contrast.  Comparison: 03/04/2013.  Findings: Left frontal subcutaneous hematoma as noted on recent CT. No skull fracture or intracranial hemorrhage.  Significant small vessel disease type changes without CT evidence of large acute infarct.  Given the degree of white matter type changes, limited for excluding a small acute infarct.  No intracranial mass lesion detected on  this unenhanced exam.  Global atrophy without hydrocephalus.  Vascular calcifications.  IMPRESSION:  Left frontal subcutaneous hematoma as noted on recent CT.  No skull fracture or intracranial hemorrhage.  Significant small vessel disease type changes without CT evidence of large acute infarct.  Given the degree of white matter type changes, limited for excluding a small acute infarct.  Global atrophy without hydrocephalus.  Code stroke called to Dr. Amada Jupiter 03/06/2013 5:40 p.m.   Original Report Authenticated By: Lacy Duverney, M.D.    Ct Head Wo Contrast  03/04/2013  *RADIOLOGY REPORT*  Clinical Data:  Fall, head contusion, left frontal laceration, abrasion right side of head, history coronary artery disease, hypertension, atrial fibrillation, anticoagulation therapy, dementia  CT HEAD WITHOUT CONTRAST CT CERVICAL SPINE WITHOUT CONTRAST  Technique:  Multidetector CT imaging of the head and cervical spine was performed following the standard protocol without intravenous contrast.  Multiplanar CT image reconstructions of the cervical spine were also generated.  Comparison:  02/17/2013  CT HEAD  Findings: Generalized atrophy. Stable ventricular  morphology. No midline shift or mass effect. Extensive small vessel chronic ischemic changes of deep cerebral white matter. 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 No intracranial hemorrhage, mass lesion or evidence of acute infarction. No extra-axial fluid collections. Left frontal scalp hematoma. Bones demineralized but intact. Visualized paranasal sinuses and mastoid air cells clear.  IMPRESSION: Atrophy with small vessel chronic ischemic changes of deep cerebral white matter. 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 No acute intracranial abnormalities.  CT CERVICAL SPINE  Findings: Minimal chronic opacification of a few inferior right mastoid air cells. Skull base intact. Osseous demineralization. Diffuse degenerative facet disease changes cervical spine. Disc space narrowing with minimal endplate spur formation at C5-C6. Minimal associated anterolisthesis of C5-C6 due to combination of degenerative disc and facet disease. Prevertebral soft tissues normal thickness. No acute fracture, subluxation or bone destruction. Visualized lung apices clear.  IMPRESSION:  Multilevel degenerative disc and facet disease changes cervical spine. No acute cervical spine abnormalities.   Original Report Authenticated By: Ulyses Southward, M.D.    Ct Head Wo Contrast  02/17/2013  *RADIOLOGY REPORT*  Clinical Data:  Fall  CT HEAD WITHOUT CONTRAST CT CERVICAL SPINE WITHOUT CONTRAST  Technique:  Multidetector CT imaging of the head and cervical spine was performed following the standard protocol without intravenous contrast.  Multiplanar CT image reconstructions of the cervical spine were also generated.  Comparison:  10/09/2029  CT HEAD  Findings: Chronic ischemic changes.  Global atrophy.  No mass effect, midline shift, or acute intracranial hemorrhage. Minimal fluid in the right mastoid air cells.  Left mastoid air cells are clear.  Minimal mucous material in the left mid ethmoid air cells. Paranasal sinuses are otherwise  clear.  No cranial fracture.  IMPRESSION: No acute intracranial pathology other than minimal fluid in the right mastoid air cells.  CT CERVICAL SPINE  Findings: No acute fracture.  No dislocation.  Advanced  multilevel facet arthropathy is seen throughout the cervical spine.  This causes varying degrees of foraminal narrowing.  There is also degree of spinal stenosis at C5-6 secondary to anterolisthesis and posterior disc osteophytes.  No obvious soft tissue injury.  IMPRESSION: No acute bony injury.  Degenerative changes.   Original Report Authenticated By: Jolaine Click, M.D.    Ct Cervical Spine Wo Contrast  03/04/2013  *RADIOLOGY REPORT*  Clinical Data:  Fall, head contusion, left frontal laceration, abrasion right side of head, history coronary artery disease, hypertension, atrial fibrillation, anticoagulation therapy, dementia  CT HEAD WITHOUT CONTRAST CT CERVICAL SPINE WITHOUT CONTRAST  Technique:  Multidetector CT imaging of the head and cervical spine was performed following the standard protocol without intravenous contrast.  Multiplanar CT image reconstructions of the cervical spine were also generated.  Comparison:  02/17/2013  CT HEAD  Findings: Generalized atrophy. Stable ventricular morphology. No midline shift or mass effect. Extensive small vessel chronic ischemic changes of deep cerebral white matter. 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 No intracranial hemorrhage, mass lesion or evidence of acute infarction. No extra-axial fluid collections. Left frontal scalp hematoma. Bones demineralized but intact. Visualized paranasal sinuses and mastoid air cells clear.  IMPRESSION: Atrophy with small vessel chronic ischemic changes of deep cerebral white matter. 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 No acute intracranial abnormalities.  CT CERVICAL SPINE  Findings: Minimal chronic opacification of a few inferior right mastoid air cells. Skull base intact. Osseous demineralization. Diffuse  degenerative facet disease changes cervical spine. Disc space narrowing with minimal endplate spur formation at C5-C6. Minimal associated anterolisthesis of C5-C6 due to combination of degenerative disc and facet disease. Prevertebral soft tissues normal thickness. No acute fracture, subluxation or bone destruction. Visualized lung apices clear.  IMPRESSION:  Multilevel degenerative disc and facet disease changes cervical spine. No acute cervical spine abnormalities.   Original Report Authenticated By: Ulyses Southward, M.D.    Ct Cervical Spine Wo Contrast  02/17/2013  *RADIOLOGY REPORT*  Clinical Data:  Fall  CT HEAD WITHOUT CONTRAST CT CERVICAL SPINE WITHOUT CONTRAST  Technique:  Multidetector CT imaging of the head and cervical spine was performed following the standard protocol without intravenous contrast.  Multiplanar CT image reconstructions of the cervical spine were also generated.  Comparison:  10/09/2029  CT HEAD  Findings: Chronic ischemic changes.  Global atrophy.  No mass effect, midline shift, or acute intracranial hemorrhage. Minimal fluid in the right mastoid air cells.  Left mastoid air cells are clear.  Minimal mucous material in the left mid ethmoid air cells. Paranasal sinuses are otherwise clear.  No cranial fracture.  IMPRESSION: No acute intracranial pathology other than minimal fluid in the right mastoid air cells.  CT CERVICAL SPINE  Findings: No acute fracture.  No dislocation.  Advanced  multilevel facet arthropathy is seen throughout the cervical spine.  This causes varying degrees of foraminal narrowing.  There is also degree of spinal stenosis at C5-6 secondary to anterolisthesis and posterior disc osteophytes.  No obvious soft tissue injury.  IMPRESSION: No acute bony injury.  Degenerative changes.   Original Report Authenticated By: Jolaine Click, M.D.    Dg Chest Port 1 View  03/04/2013  *RADIOLOGY REPORT*  Clinical Data: Recent fall with chest pain and shortness of breath  PORTABLE  CHEST - 1 VIEW  Comparison: Portable chest x-ray of 07/28/2012  Findings: There appears to be anterior dislocation of the right humeral head with degenerative change.  Clinical correlation is recommended as to chronicity.  The lungs are clear.  Cardiomegaly is stable and a pacemaker remains.  Degenerative change is noted in the left shoulder.  IMPRESSION:  1.  Apparent anterior dislocation of the right humeral head. Correlate clinically. 2.  Stable cardiomegaly with permanent pacemaker.   Original Report Authenticated By: Dwyane Dee, M.D.    Dg Shoulder Right Port  03/04/2013  *RADIOLOGY REPORT*  Clinical Data: Post reduction  PORTABLE RIGHT SHOULDER - 2+ VIEW  Comparison: Right shoulder films from today  Findings: The dislocation of the right humeral head has been reduced.  No  acute fracture is seen.  Degenerative spurring is noted from the acromion most consistent with impingement and probable chronic rotator cuff disease.  IMPRESSION: Reduction of anterior dislocation.   Original Report Authenticated By: Dwyane Dee, M.D.    Dg Shoulder Right Port  03/04/2013  *RADIOLOGY REPORT*  Clinical Data: Post reduction  PORTABLE RIGHT SHOULDER - 2+ VIEW  Comparison: Prior post reduction films  Findings: There is no change in the apparent anterior dislocation of the right humeral head.  The superior aspect of the humeral head appears to be abutting the inferior glenoid rim.  No fracture is seen.  IMPRESSION: Persistent dislocation of the right humeral head, most likely anterior and inferior.   Original Report Authenticated By: Dwyane Dee, M.D.    Dg Shoulder Right Port  03/04/2013  *RADIOLOGY REPORT*  Clinical Data: Post reduction  PORTABLE RIGHT SHOULDER - 2+ VIEW  Comparison: Right shoulder films from earlier today  Findings: There is little change in apparent anterior dislocation of the right humeral head.  There is degenerative change of the right shoulder.  IMPRESSION: No change in the anterior dislocation of  the right humeral head.   Original Report Authenticated By: Dwyane Dee, M.D.    Dg Shoulder Right Port  03/04/2013  *RADIOLOGY REPORT*  Clinical Data: Post reduction of the right shoulder dislocation  PORTABLE RIGHT SHOULDER - 2+ VIEW  Comparison: Right shoulder films from earlier today  Findings: There is no change in the dislocation of the right humeral head most likely anterior in position with degenerative change noted.  IMPRESSION: No change in probable anterior dislocation of the right humeral head.   Original Report Authenticated By: Dwyane Dee, M.D.    Dg Shoulder Right Port  03/04/2013  *RADIOLOGY REPORT*  Clinical Data: Larey Seat with right shoulder pain  PORTABLE RIGHT SHOULDER - 2+ VIEW  Comparison: Chest x-ray of 07/28/2012  Findings: There is anterior dislocation of the right humeral head with degenerative change.  No acute fracture is seen.  The right AC joint appears normally aligned.  IMPRESSION: Anterior dislocation of the right humeral head.   Original Report Authenticated By: Dwyane Dee, M.D.     Microbiology: Recent Results (from the past 240 hour(s))  URINE CULTURE     Status: None   Collection Time    03/04/13  8:15 AM      Result Value Range Status   Specimen Description URINE, CATHETERIZED   Final   Special Requests NONE   Final   Culture  Setup Time 03/04/2013 09:16   Final   Colony Count NO GROWTH   Final   Culture NO GROWTH   Final   Report Status 03/05/2013 FINAL   Final     Labs: Basic Metabolic Panel:  Recent Labs Lab 03/04/13 0734 03/06/13 1718 03/06/13 1739  NA 146* 137 141  K 3.0* 3.2* 3.2*  CL 107 101 103  CO2 27 26  --   GLUCOSE 113* 120* 120*  BUN 13 23 24*  CREATININE 0.84 0.92 0.90  CALCIUM 9.6 8.9  --    Liver Function Tests:  Recent Labs Lab 03/04/13 0734 03/06/13 1718  AST 17 21  ALT 7 7  ALKPHOS 105 89  BILITOT 1.0 1.0  PROT 6.9 5.9*  ALBUMIN 3.8 3.0*   No results found for this basename: LIPASE, AMYLASE,  in the last 168  hours No results found for this basename: AMMONIA,  in the last 168 hours CBC:  Recent Labs Lab 03/04/13 0734 03/06/13 1718  03/06/13 1739 03/08/13 1030  WBC 10.4 8.7  --  6.1  NEUTROABS 6.9 5.2  --   --   HGB 14.3 10.0* 9.9* 11.1*  HCT 41.0 29.6* 29.0* 32.7*  MCV 92.6 92.8  --  92.9  PLT 255 216  --  260   Cardiac Enzymes:  Recent Labs Lab 03/06/13 1719  TROPONINI <0.30   BNP: BNP (last 3 results) No results found for this basename: PROBNP,  in the last 8760 hours CBG:  Recent Labs Lab 03/06/13 1753  GLUCAP 114*       SignedRhetta Mura  [facility] 03/08/2013, 11:39 AM

## 2013-03-08 NOTE — Clinical Social Work Note (Signed)
Clinical Social Work   Pt is ready for discharge to [facility]. Facility has received discharge summary and is ready to receive pt. Pt and family are agreeable to discharge plan. [facility] will provide transportation. CSW is signing off as no further needs identified.   Dede Query, MSW, LCSW [phone]

## 2013-05-16 ENCOUNTER — Encounter: Payer: Self-pay | Admitting: Internal Medicine

## 2013-05-16 ENCOUNTER — Ambulatory Visit (INDEPENDENT_AMBULATORY_CARE_PROVIDER_SITE_OTHER): Payer: Medicare Other | Admitting: Internal Medicine

## 2013-05-16 VITALS — BP 176/101 | HR 93 | Ht 62.0 in | Wt 138.8 lb

## 2013-05-16 DIAGNOSIS — I2510 Atherosclerotic heart disease of native coronary artery without angina pectoris: Secondary | ICD-10-CM

## 2013-05-16 DIAGNOSIS — I495 Sick sinus syndrome: Secondary | ICD-10-CM

## 2013-05-16 DIAGNOSIS — I10 Essential (primary) hypertension: Secondary | ICD-10-CM

## 2013-05-16 DIAGNOSIS — G459 Transient cerebral ischemic attack, unspecified: Secondary | ICD-10-CM

## 2013-05-16 DIAGNOSIS — I4891 Unspecified atrial fibrillation: Secondary | ICD-10-CM

## 2013-05-16 LAB — PACEMAKER DEVICE OBSERVATION
BRDY-0002RV: 60 {beats}/min
BRDY-0004RV: 110 {beats}/min
RV LEAD AMPLITUDE: 8 mv
RV LEAD THRESHOLD: 1 V

## 2013-05-16 NOTE — Patient Instructions (Addendum)
Your physician wants you to follow-up in: 9 months with Dr Allred You will receive a reminder letter in the mail two months in advance. If you don't receive a letter, please call our office to schedule the follow-up appointment.  

## 2013-05-19 DIAGNOSIS — I2510 Atherosclerotic heart disease of native coronary artery without angina pectoris: Secondary | ICD-10-CM | POA: Insufficient documentation

## 2013-05-19 NOTE — Progress Notes (Signed)
PCP: Gaspar Garbe, MD Primary Cardiologist:  Dr [place]   Alison Dodson is a 79 y.o. female who presents today for routine electrophysiology followup.  Since her PPM generator change, the patient reports doing reasonably well.  She presented in 4/14 with a right shoulder dislocation following a fall.  She then returned subsequently with a TIA.  She has been treated with plavix for anticoagulation.  Today, she denies symptoms of palpitations, chest pain, shortness of breath,  lower extremity edema, dizziness, presyncope, or syncope.  She has some dementia and is advancing in age. The patient is otherwise without complaint today.   Past Medical History  Diagnosis Date  . Anxiety   . Coronary artery disease   . Depression   . Hypertension   . Tachy-brady syndrome   . Permanent atrial fibrillation   . HX: anticoagulation   . Glaucoma   . Dizziness   . SSS (sick sinus syndrome)   . GERD (gastroesophageal reflux disease)   . Fall   . Osteoarthritis   . Dementia    Past Surgical History  Procedure Laterality Date  . Insert / replace / remove pacemaker  01/24/2008    DDD PACEMAKER IMPLANT  . Cholecystectomy    . Gastric bypass    . Breast reduction surgery    . US echocardiography  08/10/2007    EF 60%  . US echocardiography  01/12/2007    EF 70%  . Cardiovascular stress test  02/15/2007  . Total knee arthroplasty      right  . Pacemaker insertion      Current Outpatient Prescriptions  Medication Sig Dispense Refill  . acetaminophen (TYLENOL) 500 MG tablet Take 500 mg by mouth every 4 (four) hours as needed for fever.       Marland Kitchen atenolol (TENORMIN) 50 MG tablet Take 50 mg by mouth daily before breakfast.       . azelastine (OPTIVAR) 0.05 % ophthalmic solution Place 1 drop into both eyes every 12 (twelve) hours.       . cholecalciferol (VITAMIN D) 1000 UNITS tablet Take 1,000 Units by mouth daily.      . clopidogrel (PLAVIX) 75 MG tablet Take 1 tablet (75 mg total) by mouth daily  with breakfast.      . Cranberry-Vitamin C-Vitamin E (CRANBERRY PLUS VITAMIN C PO) Take 2 capsules by mouth at bedtime.      . docusate sodium (STOOL SOFTENER) 100 MG capsule Take 100 mg by mouth 2 (two) times daily.       Marland Kitchen donepezil (ARICEPT) 10 MG tablet Take 10 mg by mouth every evening.       . latanoprost (XALATAN) 0.005 % ophthalmic solution Place 1 drop into both eyes daily.      Marland Kitchen lisinopril (PRINIVIL,ZESTRIL) 10 MG tablet Take 10 mg by mouth daily before breakfast.       . mirtazapine (REMERON) 7.5 MG tablet Take 7.5 mg by mouth at bedtime.      Marland Kitchen omeprazole (PRILOSEC) 20 MG capsule Take 20 mg by mouth every morning.       . polyethylene glycol powder (GLYCOLAX/MIRALAX) powder Take 17 g by mouth every other day. Dissolve in 8 oz of beverage of choice and drink      . potassium chloride (K-DUR,KLOR-CON) 10 MEQ tablet Take 10 mEq by mouth every morning.       . traMADol (ULTRAM) 50 MG tablet Take 1 tablet (50 mg total) by mouth every 6 (six) hours as needed for  pain.  15 tablet  0   No current facility-administered medications for this visit.   Facility-Administered Medications Ordered in Other Visits  Medication Dose Route Frequency Provider Last Rate Last Dose  . 0.45 % sodium chloride infusion   Intravenous Continuous Hillis Range, MD      . 0.9 %  sodium chloride infusion  250 mL Intravenous Continuous Hillis Range, MD      . chlorhexidine (HIBICLENS) 4 % liquid 4 application  60 mL Topical Once Hillis Range, MD      . sodium chloride 0.9 % injection 3 mL  3 mL Intravenous Q12H Hillis Range, MD      . sodium chloride 0.9 % injection 3 mL  3 mL Intravenous PRN Hillis Range, MD        Physical Exam: Filed Vitals:   05/16/13 1455  BP: 176/101  Pulse: 93  Height: 5\' 2"  (1.575 m)  Weight: 138 lb 12.8 oz (62.959 kg)    GEN- The patient is elderly appearing, alert and oriented x 3 today.   Head- normocephalic, atraumatic Eyes-  Sclera clear, conjunctiva pink Ears- hearing  intact Oropharynx- clear Lungs- Clear to ausculation bilaterally, normal work of breathing Chest- pacemaker pocket is well healed Heart- irregular rate and rhythm  GI- soft, NT, ND, + BS Extremities- no clubbing, cyanosis, or edema  Pacemaker interrogation- reviewed in detail today,  See PACEART report  Assessment and Plan:  1. Bradycardia Normal pacemaker function See Pace Art report No changes today  2. Permanent afib Her CHADS2VASC score is at least 7.  She should therefore be anticoagulated long term.  There is some reluctance to anticoagulate her due to falls.  She has been placed on plavix however which carries increased bleeding risks without adequate protection from stroke with Afib (ACTIVE-W).  I think that eliquis may be a much better option.  AVERROES suggests that bleeding risks with eliquis are similar to ASA with a significant reduction in bleeding risks. I have encouraged her daughter to consider eliquis instead of plavix.  She will continue to think about this option but is not ready to make the change today.  She can discuss this further with Dr [place] or her primary care physician.  3. CAD Stable No change required today  4. HTN Stable No change required today  Return in 9 months for device follow-up

## 2013-06-05 ENCOUNTER — Encounter: Payer: Self-pay | Admitting: Internal Medicine

## 2013-08-02 ENCOUNTER — Encounter: Payer: Medicare Other | Admitting: Internal Medicine

## 2013-08-23 ENCOUNTER — Emergency Department (HOSPITAL_COMMUNITY): Payer: Medicare Other

## 2013-08-23 ENCOUNTER — Encounter (HOSPITAL_COMMUNITY): Payer: Self-pay | Admitting: Emergency Medicine

## 2013-08-23 ENCOUNTER — Emergency Department (HOSPITAL_COMMUNITY)
Admission: EM | Admit: 2013-08-23 | Discharge: 2013-08-24 | Disposition: A | Discharge status: Home / Self Care | Payer: Medicare Other | Attending: Emergency Medicine | Admitting: Emergency Medicine

## 2013-08-23 DIAGNOSIS — Z7902 Long term (current) use of antithrombotics/antiplatelets: Secondary | ICD-10-CM | POA: Insufficient documentation

## 2013-08-23 DIAGNOSIS — S4980XA Other specified injuries of shoulder and upper arm, unspecified arm, initial encounter: Secondary | ICD-10-CM | POA: Insufficient documentation

## 2013-08-23 DIAGNOSIS — F411 Generalized anxiety disorder: Secondary | ICD-10-CM | POA: Insufficient documentation

## 2013-08-23 DIAGNOSIS — S46909A Unspecified injury of unspecified muscle, fascia and tendon at shoulder and upper arm level, unspecified arm, initial encounter: Secondary | ICD-10-CM | POA: Insufficient documentation

## 2013-08-23 DIAGNOSIS — S6990XA Unspecified injury of unspecified wrist, hand and finger(s), initial encounter: Secondary | ICD-10-CM | POA: Insufficient documentation

## 2013-08-23 DIAGNOSIS — I2510 Atherosclerotic heart disease of native coronary artery without angina pectoris: Secondary | ICD-10-CM | POA: Insufficient documentation

## 2013-08-23 DIAGNOSIS — Z79899 Other long term (current) drug therapy: Secondary | ICD-10-CM | POA: Insufficient documentation

## 2013-08-23 DIAGNOSIS — H409 Unspecified glaucoma: Secondary | ICD-10-CM | POA: Insufficient documentation

## 2013-08-23 DIAGNOSIS — Y9389 Activity, other specified: Secondary | ICD-10-CM | POA: Insufficient documentation

## 2013-08-23 DIAGNOSIS — IMO0002 Reserved for concepts with insufficient information to code with codable children: Secondary | ICD-10-CM | POA: Insufficient documentation

## 2013-08-23 DIAGNOSIS — Z87891 Personal history of nicotine dependence: Secondary | ICD-10-CM | POA: Insufficient documentation

## 2013-08-23 DIAGNOSIS — I10 Essential (primary) hypertension: Secondary | ICD-10-CM | POA: Insufficient documentation

## 2013-08-23 DIAGNOSIS — I4891 Unspecified atrial fibrillation: Secondary | ICD-10-CM | POA: Insufficient documentation

## 2013-08-23 DIAGNOSIS — F329 Major depressive disorder, single episode, unspecified: Secondary | ICD-10-CM | POA: Insufficient documentation

## 2013-08-23 DIAGNOSIS — F3289 Other specified depressive episodes: Secondary | ICD-10-CM | POA: Insufficient documentation

## 2013-08-23 DIAGNOSIS — S0003XA Contusion of scalp, initial encounter: Secondary | ICD-10-CM | POA: Insufficient documentation

## 2013-08-23 DIAGNOSIS — K219 Gastro-esophageal reflux disease without esophagitis: Secondary | ICD-10-CM | POA: Insufficient documentation

## 2013-08-23 DIAGNOSIS — S0990XA Unspecified injury of head, initial encounter: Secondary | ICD-10-CM

## 2013-08-23 DIAGNOSIS — Z950 Presence of cardiac pacemaker: Secondary | ICD-10-CM | POA: Insufficient documentation

## 2013-08-23 DIAGNOSIS — T07XXXA Unspecified multiple injuries, initial encounter: Secondary | ICD-10-CM

## 2013-08-23 DIAGNOSIS — W102XXA Fall (on)(from) incline, initial encounter: Secondary | ICD-10-CM

## 2013-08-23 DIAGNOSIS — F0390 Unspecified dementia without behavioral disturbance: Secondary | ICD-10-CM | POA: Insufficient documentation

## 2013-08-23 DIAGNOSIS — Y9210 Unspecified residential institution as the place of occurrence of the external cause: Secondary | ICD-10-CM | POA: Insufficient documentation

## 2013-08-23 DIAGNOSIS — Z862 Personal history of diseases of the blood and blood-forming organs and certain disorders involving the immune mechanism: Secondary | ICD-10-CM | POA: Insufficient documentation

## 2013-08-23 DIAGNOSIS — W1809XA Striking against other object with subsequent fall, initial encounter: Secondary | ICD-10-CM | POA: Insufficient documentation

## 2013-08-23 DIAGNOSIS — W050XXA Fall from non-moving wheelchair, initial encounter: Secondary | ICD-10-CM | POA: Insufficient documentation

## 2013-08-23 LAB — CBC WITH DIFFERENTIAL/PLATELET
Eosinophils Relative: 2 % (ref 0–5)
HCT: 41.1 % (ref 36.0–46.0)
Hemoglobin: 14.3 g/dL (ref 12.0–15.0)
Lymphocytes Relative: 15 % (ref 12–46)
Lymphs Abs: 1.3 10*3/uL (ref 0.7–4.0)
MCV: 90.3 fL (ref 78.0–100.0)
Platelets: 267 10*3/uL (ref 150–400)
RBC: 4.55 MIL/uL (ref 3.87–5.11)
WBC: 8.7 10*3/uL (ref 4.0–10.5)

## 2013-08-23 LAB — COMPREHENSIVE METABOLIC PANEL
ALT: 8 U/L (ref 0–35)
Alkaline Phosphatase: 94 U/L (ref 39–117)
CO2: 21 mEq/L (ref 19–32)
Calcium: 9.4 mg/dL (ref 8.4–10.5)
GFR calc Af Amer: 59 mL/min — ABNORMAL LOW (ref >90)
GFR calc non Af Amer: 51 mL/min — ABNORMAL LOW (ref >90)
Glucose, Bld: 84 mg/dL (ref 70–99)
Sodium: 138 mEq/L (ref 135–145)

## 2013-08-23 LAB — TYPE AND SCREEN

## 2013-08-23 MED ORDER — HYDROCODONE-ACETAMINOPHEN 5-325 MG PO TABS
1.0000 | ORAL_TABLET | Freq: Once | ORAL | Status: AC
  Administered 2013-08-23: 1 via ORAL
  Filled 2013-08-23: qty 1

## 2013-08-23 NOTE — ED Notes (Signed)
Report per EMS. Pt is from the [facility]. Pt has a hx of dementia, and the nursing home staff report that her baseline is "Alzheimer's". Pt gets around in a wheelchair, where she has to be buckled in. She habitually unbuckles herself. Today she reportedly  unbuckled herself from the wheelchair, attempted to stand, and fell. Pt has a large hematoma on the left side of her head, and a skin tear to the left hand. Pt takes Plavix. Staff and pt deny LOC.

## 2013-08-23 NOTE — ED Notes (Signed)
Pt daughter is Liliane Channel. Her phone number is [phone]. She would like to be updated as care progresses.

## 2013-08-23 NOTE — ED Provider Notes (Signed)
CSN: [id number]     Arrival date & time 08/23/13  1845 History   First MD Initiated Contact with Patient 08/23/13 1913     Chief Complaint  Patient presents with  . Fall   (Consider location/radiation/quality/duration/timing/severity/associated sxs/prior Treatment) HPI Comments: 79 year old nursing home patient presents emergency department with chief complaint of falling out of wheelchair. Per report patient unbuckle herself and attempted to stand and fell forward and struck her head on a hard surface of the floor. She sustained a large hematoma to the left frontal area of her head and she is also complaining of left hand pain and left shoulder pain. She denies loss of consciousness, however patient is a poor historian due to dementia.  Patient is alert and oriented and mildly confused. She denies neck pain, chest pain, abdominal pain, pelvis pain, right upper extremity or lower extremity pain.  Patient is a 79 y.o. female presenting with fall. The history is provided by the patient. The history is limited by the absence of a caregiver (dementia).  Fall This is a new problem. The current episode started less than 1 hour ago. The problem has not changed since onset.Associated symptoms include headaches. Pertinent negatives include no chest pain, no abdominal pain and no shortness of breath. Nothing aggravates the symptoms. Nothing relieves the symptoms. She has tried nothing for the symptoms.    Past Medical History  Diagnosis Date  . Anxiety   . Coronary artery disease   . Depression   . Hypertension   . Tachy-brady syndrome   . Permanent atrial fibrillation   . HX: anticoagulation   . Glaucoma   . Dizziness   . SSS (sick sinus syndrome)   . GERD (gastroesophageal reflux disease)   . Fall   . Osteoarthritis   . Dementia    Past Surgical History  Procedure Laterality Date  . Insert / replace / remove pacemaker  01/24/2008    DDD PACEMAKER IMPLANT  . Cholecystectomy    . Gastric  bypass    . Breast reduction surgery    . US echocardiography  08/10/2007    EF 60%  . US echocardiography  01/12/2007    EF 70%  . Cardiovascular stress test  02/15/2007  . Total knee arthroplasty      right  . Pacemaker insertion     Family History  Problem Relation Age of Onset  . Kidney failure Mother   . Heart attack Father   . Heart attack Sister   . Heart attack Brother    History  Substance Use Topics  . Smoking status: Former Smoker    Quit date: 07/17/1961  . Smokeless tobacco: Not on file  . Alcohol Use: No   OB History   Grav Para Term Preterm Abortions TAB SAB Ect Mult Living                 Review of Systems  Constitutional: Positive for activity change. Negative for fever, chills, diaphoresis and fatigue.  HENT: Negative for hearing loss, ear pain, neck pain, neck stiffness, tinnitus and ear discharge.   Eyes: Negative.   Respiratory: Negative for shortness of breath.   Cardiovascular: Negative for chest pain.  Gastrointestinal: Negative.  Negative for abdominal pain.  Endocrine: Negative.   Musculoskeletal: Positive for joint swelling.       Pain to left hand and left shoulder  Allergic/Immunologic: Negative.   Neurological: Positive for headaches. Negative for dizziness and facial asymmetry.  Hematological: Negative.   Psychiatric/Behavioral: Positive  for confusion. Negative for hallucinations, dysphoric mood and decreased concentration.    Allergies  Codeine and Iodine  Home Medications   Current Outpatient Rx  Name  Route  Sig  Dispense  Refill  . acetaminophen (TYLENOL) 500 MG tablet   Oral   Take 500 mg by mouth every 4 (four) hours as needed for pain.          Marland Kitchen atenolol (TENORMIN) 50 MG tablet   Oral   Take 50 mg by mouth daily before breakfast.          . azelastine (OPTIVAR) 0.05 % ophthalmic solution   Both Eyes   Place 1 drop into both eyes every 12 (twelve) hours.          Marland Kitchen buPROPion (WELLBUTRIN XL) 300 MG 24 hr tablet    Oral   Take 300 mg by mouth daily.         . cholecalciferol (VITAMIN D) 1000 UNITS tablet   Oral   Take 1,000 Units by mouth daily.         . clopidogrel (PLAVIX) 75 MG tablet   Oral   Take 1 tablet (75 mg total) by mouth daily with breakfast.         . Cranberry-Vitamin C-Vitamin E (CRANBERRY PLUS VITAMIN C PO)   Oral   Take 2 capsules by mouth at bedtime.         . docusate sodium (STOOL SOFTENER) 100 MG capsule   Oral   Take 100 mg by mouth 2 (two) times daily.          Marland Kitchen donepezil (ARICEPT) 10 MG tablet   Oral   Take 10 mg by mouth every evening.          . hydroxypropyl methylcellulose (ISOPTO TEARS) 2.5 % ophthalmic solution   Both Eyes   Place 2 drops into both eyes 4 (four) times daily as needed (dry eyes).         Marland Kitchen latanoprost (XALATAN) 0.005 % ophthalmic solution   Both Eyes   Place 1 drop into both eyes daily.         Marland Kitchen lisinopril (PRINIVIL,ZESTRIL) 20 MG tablet   Oral   Take 20 mg by mouth daily.         . mirtazapine (REMERON) 15 MG tablet   Oral   Take 15 mg by mouth at bedtime.         . pantoprazole (PROTONIX) 40 MG tablet   Oral   Take 40 mg by mouth daily.          . polyethylene glycol powder (GLYCOLAX/MIRALAX) powder   Oral   Take 17 g by mouth every other day.          . potassium chloride (K-DUR,KLOR-CON) 10 MEQ tablet   Oral   Take 10 mEq by mouth every morning.          Marland Kitchen QUEtiapine (SEROQUEL) 25 MG tablet   Oral   Take 25 mg by mouth 2 (two) times daily.         . traMADol (ULTRAM) 50 MG tablet   Oral   Take 1 tablet (50 mg total) by mouth every 6 (six) hours as needed for pain.   15 tablet   0   . HYDROcodone-acetaminophen (NORCO/VICODIN) 5-325 MG per tablet   Oral   Take 1 tablet by mouth every 6 (six) hours as needed for pain.   6 tablet   0    BP  160/96  Pulse 68  Temp(Src) 97.6 F (36.4 C) (Oral)  Resp 18  SpO2 95% Physical Exam  Constitutional: She is oriented to person, place, and  time. She appears well-developed and well-nourished.  HENT:  Head: Head is with contusion. Head is without raccoon's eyes, without Battle's sign, without laceration, without right periorbital erythema and without left periorbital erythema. Hair is normal.    Right Ear: Tympanic membrane normal.  Left Ear: Tympanic membrane normal.  Nose: Nose normal.  Mouth/Throat: Uvula is midline and oropharynx is clear and moist.  Neck: Normal range of motion. Neck supple.  Cardiovascular: Normal rate and regular rhythm.   Pulmonary/Chest: Effort normal and breath sounds normal.  Abdominal: Soft. Bowel sounds are normal.  Musculoskeletal:       Hands: Neurological: She is alert and oriented to person, place, and time. She has normal strength. No cranial nerve deficit or sensory deficit. GCS eye subscore is 4. GCS verbal subscore is 5. GCS motor subscore is 6.  Skin:       ED Course  Procedures (including critical care time) Labs Review Labs Reviewed  COMPREHENSIVE METABOLIC PANEL - Abnormal; Notable for the following:    Potassium 5.7 (*)    AST 40 (*)    GFR calc non Af Amer 51 (*)    GFR calc Af Amer 59 (*)    All other components within normal limits  CBC WITH DIFFERENTIAL  TROPONIN I  URINALYSIS, ROUTINE W REFLEX MICROSCOPIC  TYPE AND SCREEN  ABO/RH   Imaging Review Dg Chest 1 View  08/23/2013   CLINICAL DATA:  Pain  EXAM: CHEST - 1 VIEW  COMPARISON:  None.  FINDINGS: The cardiac shadow is enlarged. A pacing device is seen. The lungs are well-aerated without focal infiltrate or sizable effusion. No acute bony abnormality is seen.  IMPRESSION: No acute abnormality seen.   Electronically Signed   By: Alcide Clever M.D.   On: 08/23/2013 21:10   Dg Thoracic Spine 2 View  08/23/2013   CLINICAL DATA:  Pain  EXAM: THORACIC SPINE - 2 VIEW  COMPARISON:  None.  FINDINGS: Vertebral body height is well maintained. Multilevel osteophytic changes are noted. No paraspinal mass lesion is seen.   IMPRESSION: Degenerative change without acute abnormality.   Electronically Signed   By: Alcide Clever M.D.   On: 08/23/2013 21:10   Dg Lumbar Spine Complete  08/23/2013   CLINICAL DATA:  Low back pain  EXAM: LUMBAR SPINE - COMPLETE 4+ VIEW  COMPARISON:  None.  FINDINGS: Vertebral body height is well maintained. Mild osteophytic changes are noted. Significant facet hypertrophic changes are seen. No spondylolysis or spondylolisthesis is noted.  IMPRESSION: Degenerative changes without acute abnormality.   Electronically Signed   By: Alcide Clever M.D.   On: 08/23/2013 21:08   Dg Pelvis 1-2 Views  08/23/2013   CLINICAL DATA:  Pain  EXAM: PELVIS - 1-2 VIEW  COMPARISON:  10/09/2012  FINDINGS: There are changes consistent with prior pubic ring fractures on the left. No acute fracture or dislocation is noted. Degenerative changes of the lumbar spine are seen.  IMPRESSION: Chronic changes without acute abnormality.   Electronically Signed   By: Alcide Clever M.D.   On: 08/23/2013 21:07   Dg Wrist Complete Left  08/23/2013   CLINICAL DATA:  Left wrist pain  EXAM: LEFT WRIST - COMPLETE 3+ VIEW  COMPARISON:  None.  FINDINGS: No acute fracture or dislocation is noted. Mild osteopenia is seen.  IMPRESSION:  No acute abnormality noted.   Electronically Signed   By: Alcide Clever M.D.   On: 08/23/2013 21:07   Ct Head Wo Contrast  08/23/2013   CLINICAL DATA:  Recent traumatic injury  EXAM: CT HEAD WITHOUT CONTRAST  CT CERVICAL SPINE WITHOUT CONTRAST  TECHNIQUE: Multidetector CT imaging of the head and cervical spine was performed following the standard protocol without intravenous contrast. Multiplanar CT image reconstructions of the cervical spine were also generated.  COMPARISON:  03/07/2013  FINDINGS: CT HEAD FINDINGS  The bony calvarium is intact. Soft tissue swelling is noted over the left temporal region consistent with the recent injury. Diffuse atrophic changes are noted as well as chronic white matter ischemic  change. No findings to suggest acute hemorrhage, acute infarction or space-occupying mass lesion are noted.  CT CERVICAL SPINE FINDINGS  Seven cervical segments are well visualized. Vertebral body height is well maintained. Mild anterolisthesis of C5 on C6 is noted of a degenerative nature. Multilevel facet hypertrophic changes are seen. No acute fracture or acute facet abnormality is noted. The surrounding soft tissues are within normal limits. The visualized lung apices are unremarkable.  IMPRESSION: CT HEAD IMPRESSION  Chronic changes without acute intracranial abnormality.  Soft tissue hematoma on the left.  CT CERVICAL SPINE IMPRESSION  Multilevel facet hypertrophic changes. No acute abnormality is noted.   Electronically Signed   By: Alcide Clever M.D.   On: 08/23/2013 19:47   Ct Cervical Spine Wo Contrast  08/23/2013   CLINICAL DATA:  Recent traumatic injury  EXAM: CT HEAD WITHOUT CONTRAST  CT CERVICAL SPINE WITHOUT CONTRAST  TECHNIQUE: Multidetector CT imaging of the head and cervical spine was performed following the standard protocol without intravenous contrast. Multiplanar CT image reconstructions of the cervical spine were also generated.  COMPARISON:  03/07/2013  FINDINGS: CT HEAD FINDINGS  The bony calvarium is intact. Soft tissue swelling is noted over the left temporal region consistent with the recent injury. Diffuse atrophic changes are noted as well as chronic white matter ischemic change. No findings to suggest acute hemorrhage, acute infarction or space-occupying mass lesion are noted.  CT CERVICAL SPINE FINDINGS  Seven cervical segments are well visualized. Vertebral body height is well maintained. Mild anterolisthesis of C5 on C6 is noted of a degenerative nature. Multilevel facet hypertrophic changes are seen. No acute fracture or acute facet abnormality is noted. The surrounding soft tissues are within normal limits. The visualized lung apices are unremarkable.  IMPRESSION: CT HEAD  IMPRESSION  Chronic changes without acute intracranial abnormality.  Soft tissue hematoma on the left.  CT CERVICAL SPINE IMPRESSION  Multilevel facet hypertrophic changes. No acute abnormality is noted.   Electronically Signed   By: Alcide Clever M.D.   On: 08/23/2013 19:47   Dg Shoulder Left  08/23/2013   CLINICAL DATA:  Pain  EXAM: LEFT SHOULDER - 2+ VIEW  COMPARISON:  None.  FINDINGS: Degenerative changes of the glenohumeral articulation and acromioclavicular joint are seen. No acute fracture or dislocation is noted. The underlying bony thorax is within normal limits.  IMPRESSION: Degenerative change without acute abnormality.   Electronically Signed   By: Alcide Clever M.D.   On: 08/23/2013 21:11   Dg Hand Complete Left  08/23/2013   CLINICAL DATA:  Left hand pain  EXAM: LEFT HAND - COMPLETE 3+ VIEW  COMPARISON:  None.  FINDINGS: Mild swelling of the 5th digit is identified. No acute fracture or dislocation is noted. Mild degenerative changes of the interphalangeal joints  are seen. Vascular calcifications are noted as well.  IMPRESSION: Soft tissue swelling of the 5th digit without acute bony abnormality. Mild degenerative changes are seen.   Electronically Signed   By: Alcide Clever M.D.   On: 08/23/2013 21:06   Results for orders placed during the hospital encounter of 08/23/13  CBC WITH DIFFERENTIAL      Result Value Range   WBC 8.7  4.0 - 10.5 K/uL   RBC 4.55  3.87 - 5.11 MIL/uL   Hemoglobin 14.3  12.0 - 15.0 g/dL   HCT 16.1  09.6 - 04.5 %   MCV 90.3  78.0 - 100.0 fL   MCH 31.4  26.0 - 34.0 pg   MCHC 34.8  30.0 - 36.0 g/dL   RDW 40.9  81.1 - 91.4 %   Platelets 267  150 - 400 K/uL   Neutrophils Relative % 76  43 - 77 %   Neutro Abs 6.6  1.7 - 7.7 K/uL   Lymphocytes Relative 15  12 - 46 %   Lymphs Abs 1.3  0.7 - 4.0 K/uL   Monocytes Relative 7  3 - 12 %   Monocytes Absolute 0.6  0.1 - 1.0 K/uL   Eosinophils Relative 2  0 - 5 %   Eosinophils Absolute 0.2  0.0 - 0.7 K/uL   Basophils  Relative 0  0 - 1 %   Basophils Absolute 0.0  0.0 - 0.1 K/uL  COMPREHENSIVE METABOLIC PANEL      Result Value Range   Sodium 138  135 - 145 mEq/L   Potassium 5.7 (*) 3.5 - 5.1 mEq/L   Chloride 102  96 - 112 mEq/L   CO2 21  19 - 32 mEq/L   Glucose, Bld 84  70 - 99 mg/dL   BUN 20  6 - 23 mg/dL   Creatinine, Ser 7.82  0.50 - 1.10 mg/dL   Calcium 9.4  8.4 - 95.6 mg/dL   Total Protein 7.4  6.0 - 8.3 g/dL   Albumin 3.8  3.5 - 5.2 g/dL   AST 40 (*) 0 - 37 U/L   ALT 8  0 - 35 U/L   Alkaline Phosphatase 94  39 - 117 U/L   Total Bilirubin 0.7  0.3 - 1.2 mg/dL   GFR calc non Af Amer 51 (*) >90 mL/min   GFR calc Af Amer 59 (*) >90 mL/min  TROPONIN I      Result Value Range   Troponin I <0.30  <0.30 ng/mL  TYPE AND SCREEN      Result Value Range   ABO/RH(D) O POS     Antibody Screen NEG     Sample Expiration 08/26/2013    ABO/RH      Result Value Range   ABO/RH(D) O POS       Date: 08/23/2013  Rate: 77  Rhythm: atrial fibrillation  QRS Axis: normal  Intervals: normal  ST/T Wave abnormalities: nonspecific ST changes  Conduction Disutrbances:none  Narrative Interpretation:   Old EKG Reviewed: unchanged    MDM   1. Fall (on)(from) incline, initial encounter   2. CHI (closed head injury), initial encounter   3. Strains of multiple ligaments or muscles    79 year old white female presents emergency department status post fall which was described as mechanical. Fall to place a skilled nursing facility at which time the patient unbuckled herself from her wheelchair attempted to stand and fell forward. Vital signs are stable. Will initiate workup to include  CT head and C-spine, lab work, EKG, troponin, and plain films.  ER workup negative to include imaging, lab work, EKG. No issues during ER monitoring. Plan to discharge patient to skilled nursing facility. ER precautions were given. Vital signs stable at time of discharge and patient was without question.  I do not suspect  intracranial hemorrhage, spine fracture, intrathoracic or abdominal injury, fractured extremity. I do not suspect cardiac etiology for the patient's fall more likely was a mechanical fall.  Darlys Gales, MD 08/24/13 [phone]

## 2013-08-24 ENCOUNTER — Telehealth (HOSPITAL_COMMUNITY): Payer: Self-pay | Admitting: Emergency Medicine

## 2013-08-24 MED ORDER — HYDROCODONE-ACETAMINOPHEN 5-325 MG PO TABS: 1.0000 | ORAL_TABLET | Freq: Four times a day (QID) | ORAL | Status: DC | PRN

## 2013-08-24 MED ORDER — HYDROCODONE-ACETAMINOPHEN 5-325 MG PO TABS: 1.0000 | ORAL_TABLET | Freq: Once | ORAL | Status: DC

## 2013-08-24 NOTE — ED Notes (Signed)
Hand off report called to [facility] at [phone], E. Odis Luster. Updated on patient status. Pt family Lendell Caprice also notified of patient discharge to [facility] house status with patients permission.

## 2013-08-24 NOTE — ED Notes (Signed)
[facility] arrived to transport patient, hand off given, discharge instructions given to transport.

## 2013-09-13 ENCOUNTER — Emergency Department (HOSPITAL_COMMUNITY)
Admission: EM | Admit: 2013-09-13 | Discharge: 2013-09-14 | Disposition: A | Discharge status: Home / Self Care | Payer: Medicare Other | Attending: Emergency Medicine | Admitting: Emergency Medicine

## 2013-09-13 ENCOUNTER — Encounter (HOSPITAL_COMMUNITY): Payer: Self-pay | Admitting: Emergency Medicine

## 2013-09-13 DIAGNOSIS — F411 Generalized anxiety disorder: Secondary | ICD-10-CM | POA: Insufficient documentation

## 2013-09-13 DIAGNOSIS — X58XXXA Exposure to other specified factors, initial encounter: Secondary | ICD-10-CM | POA: Insufficient documentation

## 2013-09-13 DIAGNOSIS — I10 Essential (primary) hypertension: Secondary | ICD-10-CM | POA: Insufficient documentation

## 2013-09-13 DIAGNOSIS — I2510 Atherosclerotic heart disease of native coronary artery without angina pectoris: Secondary | ICD-10-CM | POA: Insufficient documentation

## 2013-09-13 DIAGNOSIS — Y92009 Unspecified place in unspecified non-institutional (private) residence as the place of occurrence of the external cause: Secondary | ICD-10-CM | POA: Insufficient documentation

## 2013-09-13 DIAGNOSIS — Z87891 Personal history of nicotine dependence: Secondary | ICD-10-CM | POA: Insufficient documentation

## 2013-09-13 DIAGNOSIS — I4891 Unspecified atrial fibrillation: Secondary | ICD-10-CM | POA: Insufficient documentation

## 2013-09-13 DIAGNOSIS — Z8739 Personal history of other diseases of the musculoskeletal system and connective tissue: Secondary | ICD-10-CM | POA: Insufficient documentation

## 2013-09-13 DIAGNOSIS — Y9389 Activity, other specified: Secondary | ICD-10-CM | POA: Insufficient documentation

## 2013-09-13 DIAGNOSIS — F3289 Other specified depressive episodes: Secondary | ICD-10-CM | POA: Insufficient documentation

## 2013-09-13 DIAGNOSIS — K219 Gastro-esophageal reflux disease without esophagitis: Secondary | ICD-10-CM | POA: Insufficient documentation

## 2013-09-13 DIAGNOSIS — S81009A Unspecified open wound, unspecified knee, initial encounter: Secondary | ICD-10-CM | POA: Insufficient documentation

## 2013-09-13 DIAGNOSIS — S81802A Unspecified open wound, left lower leg, initial encounter: Secondary | ICD-10-CM

## 2013-09-13 DIAGNOSIS — Z7902 Long term (current) use of antithrombotics/antiplatelets: Secondary | ICD-10-CM | POA: Insufficient documentation

## 2013-09-13 DIAGNOSIS — H409 Unspecified glaucoma: Secondary | ICD-10-CM | POA: Insufficient documentation

## 2013-09-13 DIAGNOSIS — Z950 Presence of cardiac pacemaker: Secondary | ICD-10-CM | POA: Insufficient documentation

## 2013-09-13 DIAGNOSIS — Z9181 History of falling: Secondary | ICD-10-CM | POA: Insufficient documentation

## 2013-09-13 DIAGNOSIS — F329 Major depressive disorder, single episode, unspecified: Secondary | ICD-10-CM | POA: Insufficient documentation

## 2013-09-13 DIAGNOSIS — Z79899 Other long term (current) drug therapy: Secondary | ICD-10-CM | POA: Insufficient documentation

## 2013-09-13 DIAGNOSIS — F0390 Unspecified dementia without behavioral disturbance: Secondary | ICD-10-CM | POA: Insufficient documentation

## 2013-09-13 LAB — CBC WITH DIFFERENTIAL/PLATELET
Basophils Absolute: 0 10*3/uL (ref 0.0–0.1)
Eosinophils Absolute: 0.1 10*3/uL (ref 0.0–0.7)
Eosinophils Relative: 2 % (ref 0–5)
Hemoglobin: 14 g/dL (ref 12.0–15.0)
Lymphs Abs: 1.4 10*3/uL (ref 0.7–4.0)
MCH: 31.2 pg (ref 26.0–34.0)
MCV: 90.2 fL (ref 78.0–100.0)
Monocytes Relative: 8 % (ref 3–12)
Platelets: 188 10*3/uL (ref 150–400)
RBC: 4.49 MIL/uL (ref 3.87–5.11)
RDW: 13.9 % (ref 11.5–15.5)

## 2013-09-13 LAB — BASIC METABOLIC PANEL
BUN: 19 mg/dL (ref 6–23)
Calcium: 9.6 mg/dL (ref 8.4–10.5)
Creatinine, Ser: 0.96 mg/dL (ref 0.50–1.10)
GFR calc non Af Amer: 53 mL/min — ABNORMAL LOW (ref >90)
Glucose, Bld: 82 mg/dL (ref 70–99)
Sodium: 138 mEq/L (ref 135–145)

## 2013-09-13 NOTE — ED Notes (Signed)
Pt came in because she picked a scab from left shin, unable to stop bleeding, called EMS.  EMS arrived, wound wrapped with bleeding controlled but BP increased so came to ED.

## 2013-09-13 NOTE — ED Notes (Signed)
Pt arrives via EMS. Pt coming from [facility] after scab was picked off of left shin and staff could not get it to stop oozing blood and so called EMS. BP 200/130. No complaints. 12 lead completed. Pt has hx advanced alzheimer's. 20 LAC.

## 2013-09-13 NOTE — ED Notes (Signed)
Unable to speak to person to give report; left message for number where I could be reached per telephone prompting.

## 2013-09-13 NOTE — ED Provider Notes (Signed)
CSN: [id number]     Arrival date & time 09/13/13  2029 History   First MD Initiated Contact with Patient 09/13/13 2039     Chief Complaint  Patient presents with  . Hypertension   (Consider location/radiation/quality/duration/timing/severity/associated sxs/prior Treatment) HPI Comments: 79 yo female with a fib, dementia, htn, cad presents with left wound bleeding and htn since PTA.  Pt picked at a scab at [facility] and bleeding continue, then they noticed bp 200 systolic. No falls or changes in mental status.  No CP.  Bleeding improved with compression.  Patient is a 79 y.o. female presenting with hypertension. The history is provided by the patient and the nursing home.  Hypertension This is a recurrent problem.    Past Medical History  Diagnosis Date  . Anxiety   . Coronary artery disease   . Depression   . Hypertension   . Tachy-brady syndrome   . Permanent atrial fibrillation   . HX: anticoagulation   . Glaucoma   . Dizziness   . SSS (sick sinus syndrome)   . GERD (gastroesophageal reflux disease)   . Fall   . Osteoarthritis   . Dementia    Past Surgical History  Procedure Laterality Date  . Insert / replace / remove pacemaker  01/24/2008    DDD PACEMAKER IMPLANT  . Cholecystectomy    . Gastric bypass    . Breast reduction surgery    . US echocardiography  08/10/2007    EF 60%  . US echocardiography  01/12/2007    EF 70%  . Cardiovascular stress test  02/15/2007  . Total knee arthroplasty      right  . Pacemaker insertion     Family History  Problem Relation Age of Onset  . Kidney failure Mother   . Heart attack Father   . Heart attack Sister   . Heart attack Brother    History  Substance Use Topics  . Smoking status: Former Smoker    Quit date: 07/17/1961  . Smokeless tobacco: Not on file  . Alcohol Use: No   OB History   Grav Para Term Preterm Abortions TAB SAB Ect Mult Living                 Review of Systems  Unable to perform ROS: Dementia     Allergies  Codeine and Iodine  Home Medications   Current Outpatient Rx  Name  Route  Sig  Dispense  Refill  . acetaminophen (TYLENOL) 500 MG tablet   Oral   Take 500 mg by mouth every 4 (four) hours as needed for pain.          Marland Kitchen atenolol (TENORMIN) 50 MG tablet   Oral   Take 50 mg by mouth daily before breakfast.          . azelastine (OPTIVAR) 0.05 % ophthalmic solution   Both Eyes   Place 1 drop into both eyes every 12 (twelve) hours.          Marland Kitchen buPROPion (WELLBUTRIN XL) 300 MG 24 hr tablet   Oral   Take 300 mg by mouth daily.         . cholecalciferol (VITAMIN D) 1000 UNITS tablet   Oral   Take 1,000 Units by mouth daily.         . clopidogrel (PLAVIX) 75 MG tablet   Oral   Take 1 tablet (75 mg total) by mouth daily with breakfast.         .  Cranberry-Vitamin C-Vitamin E (CRANBERRY PLUS VITAMIN C PO)   Oral   Take 2 capsules by mouth at bedtime.         . docusate sodium (STOOL SOFTENER) 100 MG capsule   Oral   Take 100 mg by mouth 2 (two) times daily.          Marland Kitchen donepezil (ARICEPT) 10 MG tablet   Oral   Take 10 mg by mouth every evening.          Marland Kitchen HYDROcodone-acetaminophen (NORCO/VICODIN) 5-325 MG per tablet   Oral   Take 1 tablet by mouth every 6 (six) hours as needed for pain.   6 tablet   0   . hydroxypropyl methylcellulose (ISOPTO TEARS) 2.5 % ophthalmic solution   Both Eyes   Place 2 drops into both eyes 4 (four) times daily as needed (dry eyes).         Marland Kitchen latanoprost (XALATAN) 0.005 % ophthalmic solution   Both Eyes   Place 1 drop into both eyes daily.         Marland Kitchen lisinopril (PRINIVIL,ZESTRIL) 20 MG tablet   Oral   Take 20 mg by mouth daily.         . mirtazapine (REMERON) 15 MG tablet   Oral   Take 15 mg by mouth at bedtime.         . pantoprazole (PROTONIX) 40 MG tablet   Oral   Take 40 mg by mouth daily.          . polyethylene glycol powder (GLYCOLAX/MIRALAX) powder   Oral   Take 17 g by mouth  every other day.          . potassium chloride (K-DUR,KLOR-CON) 10 MEQ tablet   Oral   Take 10 mEq by mouth every morning.          Marland Kitchen QUEtiapine (SEROQUEL) 25 MG tablet   Oral   Take 25 mg by mouth 2 (two) times daily.         . traMADol (ULTRAM) 50 MG tablet   Oral   Take 1 tablet (50 mg total) by mouth every 6 (six) hours as needed for pain.   15 tablet   0    BP 161/115  Pulse 73  Temp(Src) 97.8 F (36.6 C)  Resp 20  Ht 5\' 3"  (1.6 m)  Wt 128 lb (58.06 kg)  BMI 22.68 kg/m2  SpO2 98% Physical Exam  Nursing note and vitals reviewed. Constitutional: She is oriented to person, place, and time. She appears well-developed and well-nourished.  HENT:  Head: Normocephalic and atraumatic.  Eyes: Conjunctivae are normal. Right eye exhibits no discharge. Left eye exhibits no discharge.  Neck: Normal range of motion. Neck supple. No tracheal deviation present.  Cardiovascular: Normal rate and regular rhythm.   Pulmonary/Chest: Effort normal and breath sounds normal.  Abdominal: Soft. She exhibits no distension. There is no tenderness. There is no guarding.  Musculoskeletal: She exhibits no edema.  Neurological: She is alert and oriented to person, place, and time.  Skin: Skin is warm.  Circular 2.5 cm diameter region of bleeding left anterior prox lower ext, no surrounding erythema or edema, mild granulation tissue, mild bleeding, nv intact distal, superficial  Psychiatric: She has a normal mood and affect.    ED Course  Procedures (including critical care time) Labs Review Labs Reviewed  CBC WITH DIFFERENTIAL  BASIC METABOLIC PANEL  TROPONIN I   Imaging Review No results found.  EKG Interpretation  Ventricular Rate:  63 PR Interval:  61 QRS Duration: 175 QT Interval:  502 QTC Calculation: 514 R Axis:   -79 Text Interpretation:  Sinus rhythm Short PR interval Right bundle branch block LVH with IVCD and secondary repol abnrm Prolonged QT interval T wave  inversions lateral new            MDM  No diagnosis found. BP mild elevated in ED.  Pt denies CP or SOB.  Wound care in ED, plan for clotting material with non adherent dressing.   Well appearing on recheck, denies chest pain.   With dementia/ NH hx and only high bp as main reason for bringing pt in I do not feel admission is necessary, fup outpt.  Results and differential diagnosis were discussed with the patient. Close follow up outpatient was discussed, patient comfortable with the plan.   Diagnosis: Left leg wound/ bleeding, Dementia, HTN      Enid Skeens, MD 09/14/13 [phone]

## 2013-09-14 NOTE — ED Notes (Signed)
Pt unable to understand discharge instructions, no signature required.  Attempted report to nursing facility, no answer.  Report to [facility].

## 2013-11-30 ENCOUNTER — Encounter (HOSPITAL_COMMUNITY): Payer: Self-pay | Admitting: Emergency Medicine

## 2013-11-30 ENCOUNTER — Emergency Department (HOSPITAL_COMMUNITY): Payer: Medicare Other

## 2013-11-30 ENCOUNTER — Emergency Department (HOSPITAL_COMMUNITY)
Admission: EM | Admit: 2013-11-30 | Discharge: 2013-11-30 | Disposition: A | Discharge status: Home / Self Care | Payer: Medicare Other | Attending: Emergency Medicine | Admitting: Emergency Medicine

## 2013-11-30 DIAGNOSIS — Y939 Activity, unspecified: Secondary | ICD-10-CM | POA: Insufficient documentation

## 2013-11-30 DIAGNOSIS — I10 Essential (primary) hypertension: Secondary | ICD-10-CM | POA: Insufficient documentation

## 2013-11-30 DIAGNOSIS — R51 Headache: Secondary | ICD-10-CM | POA: Insufficient documentation

## 2013-11-30 DIAGNOSIS — F3289 Other specified depressive episodes: Secondary | ICD-10-CM | POA: Insufficient documentation

## 2013-11-30 DIAGNOSIS — M1990 Unspecified osteoarthritis, unspecified site: Secondary | ICD-10-CM | POA: Insufficient documentation

## 2013-11-30 DIAGNOSIS — Z888 Allergy status to other drugs, medicaments and biological substances status: Secondary | ICD-10-CM | POA: Insufficient documentation

## 2013-11-30 DIAGNOSIS — S0990XA Unspecified injury of head, initial encounter: Secondary | ICD-10-CM | POA: Insufficient documentation

## 2013-11-30 DIAGNOSIS — F411 Generalized anxiety disorder: Secondary | ICD-10-CM | POA: Insufficient documentation

## 2013-11-30 DIAGNOSIS — S0003XA Contusion of scalp, initial encounter: Secondary | ICD-10-CM | POA: Insufficient documentation

## 2013-11-30 DIAGNOSIS — Y9210 Unspecified residential institution as the place of occurrence of the external cause: Secondary | ICD-10-CM | POA: Insufficient documentation

## 2013-11-30 DIAGNOSIS — I4891 Unspecified atrial fibrillation: Secondary | ICD-10-CM | POA: Insufficient documentation

## 2013-11-30 DIAGNOSIS — Z7902 Long term (current) use of antithrombotics/antiplatelets: Secondary | ICD-10-CM | POA: Insufficient documentation

## 2013-11-30 DIAGNOSIS — Z79899 Other long term (current) drug therapy: Secondary | ICD-10-CM | POA: Insufficient documentation

## 2013-11-30 DIAGNOSIS — K219 Gastro-esophageal reflux disease without esophagitis: Secondary | ICD-10-CM | POA: Insufficient documentation

## 2013-11-30 DIAGNOSIS — S0083XA Contusion of other part of head, initial encounter: Secondary | ICD-10-CM

## 2013-11-30 DIAGNOSIS — Z87891 Personal history of nicotine dependence: Secondary | ICD-10-CM | POA: Insufficient documentation

## 2013-11-30 DIAGNOSIS — W19XXXA Unspecified fall, initial encounter: Secondary | ICD-10-CM | POA: Insufficient documentation

## 2013-11-30 DIAGNOSIS — S1093XA Contusion of unspecified part of neck, initial encounter: Secondary | ICD-10-CM

## 2013-11-30 DIAGNOSIS — F0390 Unspecified dementia without behavioral disturbance: Secondary | ICD-10-CM | POA: Insufficient documentation

## 2013-11-30 DIAGNOSIS — Z885 Allergy status to narcotic agent status: Secondary | ICD-10-CM | POA: Insufficient documentation

## 2013-11-30 DIAGNOSIS — I495 Sick sinus syndrome: Secondary | ICD-10-CM | POA: Insufficient documentation

## 2013-11-30 DIAGNOSIS — H409 Unspecified glaucoma: Secondary | ICD-10-CM | POA: Insufficient documentation

## 2013-11-30 DIAGNOSIS — F329 Major depressive disorder, single episode, unspecified: Secondary | ICD-10-CM | POA: Insufficient documentation

## 2013-11-30 DIAGNOSIS — I2510 Atherosclerotic heart disease of native coronary artery without angina pectoris: Secondary | ICD-10-CM | POA: Insufficient documentation

## 2013-11-30 NOTE — ED Notes (Signed)
Family updated on plan of care  

## 2013-11-30 NOTE — Discharge Instructions (Signed)
Head Injury, Adult  You have received a head injury. It does not appear serious at this time. Headaches and vomiting are common following head injury. It should be easy to awaken from sleeping. Sometimes it is necessary for you to stay in the emergency department for a while for observation. Sometimes admission to the hospital may be needed. After injuries such as yours, most problems occur within the first 24 hours, but side effects may occur up to 7 10 days after the injury. It is important for you to carefully monitor your condition and contact your health care provider or seek immediate medical care if there is a change in your condition.  WHAT ARE THE TYPES OF HEAD INJURIES?  Head injuries can be as minor as a bump. Some head injuries can be more severe. More severe head injuries include:  · A jarring injury to the brain (concussion).  · A bruise of the brain (contusion). This mean there is bleeding in the brain that can cause swelling.  · A cracked skull (skull fracture).  · Bleeding in the brain that collects, clots, and forms a bump (hematoma).  WHAT CAUSES A HEAD INJURY?  A serious head injury is most likely to happen to someone who is in a car wreck and is not wearing a seat belt. Other causes of major head injuries include bicycle or motorcycle accidents, sports injuries, and falls.  HOW ARE HEAD INJURIES DIAGNOSED?  A complete history of the event leading to the injury and your current symptoms will be helpful in diagnosing head injuries. Many times, pictures of the brain, such as CT or MRI are needed to see the extent of the injury. Often, an overnight hospital stay is necessary for observation.   WHEN SHOULD I SEEK IMMEDIATE MEDICAL CARE?   You should get help right away if:  · You have confusion or drowsiness.  · You feel sick to your stomach (nauseous) or have continued, forceful vomiting.  · You have dizziness or unsteadiness that is getting worse.  · You have severe, continued headaches not  relieved by medicine. Only take over-the-counter or prescription medicines for pain, fever, or discomfort as directed by your health care provider.  · You do not have normal function of the arms or legs or are unable to walk.  · You notice changes in the black spots in the center of the colored part of your eye (pupil).  · You have a clear or bloody fluid coming from your nose or ears.  · You have a loss of vision.  During the next 24 hours after the injury, you must stay with someone who can watch you for the warning signs. This person should contact local emergency services (911 in the [place] if you have seizures, you become unconscious, or you are unable to wake up.  HOW CAN I PREVENT A HEAD INJURY IN THE FUTURE?  The most important factor for preventing major head injuries is avoiding motor vehicle accidents.  To minimize the potential for damage to your head, it is crucial to wear seat belts while riding in motor vehicles. Wearing helmets while bike riding and playing collision sports (like football) is also helpful. Also, avoiding dangerous activities around the house will further help reduce your risk of head injury.   WHEN CAN I RETURN TO NORMAL ACTIVITIES AND ATHLETICS?  You should be reevaluated by your health care provider before returning to these activities. If you have any of the following symptoms, you should not return   to activities or contact sports until 1 week after the symptoms have stopped:  · Persistent headache.  · Dizziness or vertigo.  · Poor attention and concentration.  · Confusion.  · Memory problems.  · Nausea or vomiting.  · Fatigue or tire easily.  · Irritability.  · Intolerant of bright lights or loud noises.  · Anxiety or depression.  · Disturbed sleep.  MAKE SURE YOU:   · Understand these instructions.  · Will watch your condition.  · Will get help right away if you are not doing well or get worse.  Document Released: 11/07/2005 Document Revised: 08/28/2013 Document Reviewed:  07/15/2013  [employer]® Patient Information ©2014 [employer].

## 2013-11-30 NOTE — ED Provider Notes (Signed)
CSN: [id number]     Arrival date & time 11/30/13  1805 History   First MD Initiated Contact with Patient 11/30/13 1905     Chief Complaint  Patient presents with  . Fall   (Consider location/radiation/quality/duration/timing/severity/associated sxs/prior Treatment) Patient is a 80 y.o. female presenting with fall.  Fall   Level 5 caveat due to dementia Pt with fall, unclear circumstances, she does not remember brought from [facility] by EMS who report fall was from seated position.   Past Medical History  Diagnosis Date  . Anxiety   . Coronary artery disease   . Depression   . Hypertension   . Tachy-brady syndrome   . Permanent atrial fibrillation   . HX: anticoagulation   . Glaucoma   . Dizziness   . SSS (sick sinus syndrome)   . GERD (gastroesophageal reflux disease)   . Fall   . Osteoarthritis   . Dementia    Past Surgical History  Procedure Laterality Date  . Insert / replace / remove pacemaker  01/24/2008    DDD PACEMAKER IMPLANT  . Cholecystectomy    . Gastric bypass    . Breast reduction surgery    . US echocardiography  08/10/2007    EF 60%  . US echocardiography  01/12/2007    EF 70%  . Cardiovascular stress test  02/15/2007  . Total knee arthroplasty      right  . Pacemaker insertion     Family History  Problem Relation Age of Onset  . Kidney failure Mother   . Heart attack Father   . Heart attack Sister   . Heart attack Brother    History  Substance Use Topics  . Smoking status: Former Smoker    Quit date: 07/17/1961  . Smokeless tobacco: Not on file  . Alcohol Use: No   OB History   Grav Para Term Preterm Abortions TAB SAB Ect Mult Living                 Review of Systems Unable to assess due to mental status.   Allergies  Codeine and Iodine  Home Medications   Current Outpatient Rx  Name  Route  Sig  Dispense  Refill  . atenolol (TENORMIN) 50 MG tablet   Oral   Take 50 mg by mouth daily before breakfast.          . azelastine  (OPTIVAR) 0.05 % ophthalmic solution   Both Eyes   Place 1 drop into both eyes every 12 (twelve) hours.          Marland Kitchen buPROPion (WELLBUTRIN XL) 300 MG 24 hr tablet   Oral   Take 300 mg by mouth daily.         . cholecalciferol (VITAMIN D) 1000 UNITS tablet   Oral   Take 1,000 Units by mouth daily.         . clopidogrel (PLAVIX) 75 MG tablet   Oral   Take 1 tablet (75 mg total) by mouth daily with breakfast.         . Cranberry-Vitamin C-Vitamin E (CRANBERRY PLUS VITAMIN C PO)   Oral   Take 2 capsules by mouth at bedtime.         . docusate sodium (STOOL SOFTENER) 100 MG capsule   Oral   Take 100 mg by mouth 2 (two) times daily.          Marland Kitchen donepezil (ARICEPT) 10 MG tablet   Oral   Take 10 mg  by mouth every evening.          Marland Kitchen HYDROcodone-acetaminophen (NORCO/VICODIN) 5-325 MG per tablet   Oral   Take 1 tablet by mouth every 6 (six) hours as needed for pain.   6 tablet   0   . hydroxypropyl methylcellulose (ISOPTO TEARS) 2.5 % ophthalmic solution   Both Eyes   Place 1 drop into both eyes 2 (two) times daily.          Marland Kitchen latanoprost (XALATAN) 0.005 % ophthalmic solution   Both Eyes   Place 1 drop into both eyes daily.         Marland Kitchen lisinopril (PRINIVIL,ZESTRIL) 20 MG tablet   Oral   Take 20 mg by mouth daily.         . mirtazapine (REMERON) 15 MG tablet   Oral   Take 15 mg by mouth at bedtime.         . pantoprazole (PROTONIX) 40 MG tablet   Oral   Take 40 mg by mouth daily.          . polyethylene glycol powder (GLYCOLAX/MIRALAX) powder   Oral   Take 17 g by mouth every other day.          . potassium chloride (K-DUR,KLOR-CON) 10 MEQ tablet   Oral   Take 10 mEq by mouth every morning.          Marland Kitchen QUEtiapine (SEROQUEL) 25 MG tablet   Oral   Take 25 mg by mouth 2 (two) times daily.         . traMADol (ULTRAM) 50 MG tablet   Oral   Take 1 tablet (50 mg total) by mouth every 6 (six) hours as needed for pain.   15 tablet   0    BP  186/111  Pulse 63  Temp(Src) 98.4 F (36.9 C) (Oral)  Resp 16  SpO2 98% Physical Exam  Nursing note and vitals reviewed. Constitutional: She is oriented to person, place, and time. She appears well-developed and well-nourished.  HENT:  Head: Normocephalic.  L forehead hematoma  Eyes: EOM are normal. Pupils are equal, round, and reactive to light.  Neck: Normal range of motion. Neck supple.  Cardiovascular: Normal rate, normal heart sounds and intact distal pulses.   Pulmonary/Chest: Effort normal and breath sounds normal.  Abdominal: Bowel sounds are normal. She exhibits no distension. There is no tenderness.  Musculoskeletal: Normal range of motion. She exhibits no edema and no tenderness.  Neurological: She is alert and oriented to person, place, and time. She has normal strength. No cranial nerve deficit or sensory deficit.  Skin: Skin is warm and dry. No rash noted.  Psychiatric: She has a normal mood and affect.    ED Course  Procedures (including critical care time) Labs Review Labs Reviewed - No data to display Imaging Review Ct Head Wo Contrast  11/30/2013   CLINICAL DATA:  Fall, hematoma to left forehead, severe headache, dementia  EXAM: CT HEAD WITHOUT CONTRAST  CT CERVICAL SPINE WITHOUT CONTRAST  TECHNIQUE: Multidetector CT imaging of the head and cervical spine was performed following the standard protocol without intravenous contrast. Multiplanar CT image reconstructions of the cervical spine were also generated.  COMPARISON:  CT head/cervical spine dated 08/23/2013.  FINDINGS: CT HEAD FINDINGS  No evidence of parenchymal hemorrhage or extra-axial fluid collection. No mass lesion, mass effect, or midline shift.  No CT evidence of acute infarction.  Extensive subcortical white matter and periventricular small vessel ischemic changes.  Mild intracranial atherosclerosis.  Mild global cortical atrophy, likely age appropriate. No ventriculomegaly.  Soft tissue  swelling/extracranial hematoma overlying the left frontal bone.  No evidence of calvarial fracture.  CT CERVICAL SPINE FINDINGS  Exaggerated cervical lordosis.  No evidence of fracture or dislocation. Vertebral body heights are maintained. Dens appears intact.  No prevertebral soft tissue swelling.  Mild to moderate multilevel degenerative changes, most prominent at C5-6.  Visualized thyroid is mildly heterogeneous.  Visualized lung apices are clear.  IMPRESSION: Soft tissue swelling/extracranial hematoma overlying the left frontal bone. No evidence of calvarial fracture.  Atrophy with small vessel ischemic changes and intracranial atherosclerosis.  No evidence of traumatic injury to the cervical spine. Mild to moderate multilevel degenerative changes.   Electronically Signed   By: Charline BillsSriyesh  Krishnan M.D.   On: 11/30/2013 20:30   Ct Cervical Spine Wo Contrast  11/30/2013   CLINICAL DATA:  Fall, hematoma to left forehead, severe headache, dementia  EXAM: CT HEAD WITHOUT CONTRAST  CT CERVICAL SPINE WITHOUT CONTRAST  TECHNIQUE: Multidetector CT imaging of the head and cervical spine was performed following the standard protocol without intravenous contrast. Multiplanar CT image reconstructions of the cervical spine were also generated.  COMPARISON:  CT head/cervical spine dated 08/23/2013.  FINDINGS: CT HEAD FINDINGS  No evidence of parenchymal hemorrhage or extra-axial fluid collection. No mass lesion, mass effect, or midline shift.  No CT evidence of acute infarction.  Extensive subcortical white matter and periventricular small vessel ischemic changes. Mild intracranial atherosclerosis.  Mild global cortical atrophy, likely age appropriate. No ventriculomegaly.  Soft tissue swelling/extracranial hematoma overlying the left frontal bone.  No evidence of calvarial fracture.  CT CERVICAL SPINE FINDINGS  Exaggerated cervical lordosis.  No evidence of fracture or dislocation. Vertebral body heights are maintained. Dens  appears intact.  No prevertebral soft tissue swelling.  Mild to moderate multilevel degenerative changes, most prominent at C5-6.  Visualized thyroid is mildly heterogeneous.  Visualized lung apices are clear.  IMPRESSION: Soft tissue swelling/extracranial hematoma overlying the left frontal bone. No evidence of calvarial fracture.  Atrophy with small vessel ischemic changes and intracranial atherosclerosis.  No evidence of traumatic injury to the cervical spine. Mild to moderate multilevel degenerative changes.   Electronically Signed   By: Charline BillsSriyesh  Krishnan M.D.   On: 11/30/2013 20:30    EKG Interpretation   None       MDM   1. Fall, initial encounter   2. Head injury, acute, initial encounter     Results reviewed. No acute injury. Doubt acute metabolic or infectious process. Return to SNF.    Deni Berti B. Bernette MayersSheldon, MD 11/30/13 2107

## 2013-11-30 NOTE — ED Notes (Signed)
Pt from [facility], c/o fall from seated position. Pt slumped in chair, hit head. Hematoma to left forehead. No LOC, denies pain. Pt baseline dementia

## 2013-11-30 NOTE — ED Notes (Signed)
Pt. Feeling dizzy, confused; BP elevated. Dr. Bernette MayersSheldon aware.  Daughter called and updated on plan of care.

## 2013-11-30 NOTE — ED Notes (Signed)
Called report to [facility] - [place] [facility]

## 2013-12-23 ENCOUNTER — Emergency Department (HOSPITAL_COMMUNITY): Payer: Medicare Other

## 2013-12-23 ENCOUNTER — Emergency Department (HOSPITAL_COMMUNITY)
Admission: EM | Admit: 2013-12-23 | Discharge: 2013-12-23 | Disposition: A | Discharge status: Home / Self Care | Payer: Medicare Other | Attending: Emergency Medicine | Admitting: Emergency Medicine

## 2013-12-23 ENCOUNTER — Encounter (HOSPITAL_COMMUNITY): Payer: Self-pay | Admitting: Emergency Medicine

## 2013-12-23 DIAGNOSIS — S99919A Unspecified injury of unspecified ankle, initial encounter: Secondary | ICD-10-CM

## 2013-12-23 DIAGNOSIS — Y9389 Activity, other specified: Secondary | ICD-10-CM | POA: Insufficient documentation

## 2013-12-23 DIAGNOSIS — Z950 Presence of cardiac pacemaker: Secondary | ICD-10-CM | POA: Insufficient documentation

## 2013-12-23 DIAGNOSIS — H409 Unspecified glaucoma: Secondary | ICD-10-CM | POA: Insufficient documentation

## 2013-12-23 DIAGNOSIS — S8990XA Unspecified injury of unspecified lower leg, initial encounter: Secondary | ICD-10-CM | POA: Insufficient documentation

## 2013-12-23 DIAGNOSIS — Y9210 Unspecified residential institution as the place of occurrence of the external cause: Secondary | ICD-10-CM | POA: Insufficient documentation

## 2013-12-23 DIAGNOSIS — K219 Gastro-esophageal reflux disease without esophagitis: Secondary | ICD-10-CM | POA: Insufficient documentation

## 2013-12-23 DIAGNOSIS — S99929A Unspecified injury of unspecified foot, initial encounter: Secondary | ICD-10-CM

## 2013-12-23 DIAGNOSIS — S0003XA Contusion of scalp, initial encounter: Secondary | ICD-10-CM | POA: Insufficient documentation

## 2013-12-23 DIAGNOSIS — F0390 Unspecified dementia without behavioral disturbance: Secondary | ICD-10-CM | POA: Insufficient documentation

## 2013-12-23 DIAGNOSIS — Z23 Encounter for immunization: Secondary | ICD-10-CM | POA: Insufficient documentation

## 2013-12-23 DIAGNOSIS — F3289 Other specified depressive episodes: Secondary | ICD-10-CM | POA: Insufficient documentation

## 2013-12-23 DIAGNOSIS — S0990XA Unspecified injury of head, initial encounter: Secondary | ICD-10-CM | POA: Insufficient documentation

## 2013-12-23 DIAGNOSIS — F411 Generalized anxiety disorder: Secondary | ICD-10-CM | POA: Insufficient documentation

## 2013-12-23 DIAGNOSIS — I10 Essential (primary) hypertension: Secondary | ICD-10-CM | POA: Insufficient documentation

## 2013-12-23 DIAGNOSIS — W050XXA Fall from non-moving wheelchair, initial encounter: Secondary | ICD-10-CM | POA: Insufficient documentation

## 2013-12-23 DIAGNOSIS — W19XXXA Unspecified fall, initial encounter: Secondary | ICD-10-CM

## 2013-12-23 DIAGNOSIS — IMO0002 Reserved for concepts with insufficient information to code with codable children: Secondary | ICD-10-CM | POA: Insufficient documentation

## 2013-12-23 DIAGNOSIS — I2510 Atherosclerotic heart disease of native coronary artery without angina pectoris: Secondary | ICD-10-CM | POA: Insufficient documentation

## 2013-12-23 DIAGNOSIS — I4891 Unspecified atrial fibrillation: Secondary | ICD-10-CM | POA: Insufficient documentation

## 2013-12-23 DIAGNOSIS — S0083XA Contusion of other part of head, initial encounter: Principal | ICD-10-CM

## 2013-12-23 DIAGNOSIS — Z9181 History of falling: Secondary | ICD-10-CM | POA: Insufficient documentation

## 2013-12-23 DIAGNOSIS — Z87891 Personal history of nicotine dependence: Secondary | ICD-10-CM | POA: Insufficient documentation

## 2013-12-23 DIAGNOSIS — Z79899 Other long term (current) drug therapy: Secondary | ICD-10-CM | POA: Insufficient documentation

## 2013-12-23 DIAGNOSIS — F329 Major depressive disorder, single episode, unspecified: Secondary | ICD-10-CM | POA: Insufficient documentation

## 2013-12-23 DIAGNOSIS — Z7902 Long term (current) use of antithrombotics/antiplatelets: Secondary | ICD-10-CM | POA: Insufficient documentation

## 2013-12-23 DIAGNOSIS — S1093XA Contusion of unspecified part of neck, initial encounter: Principal | ICD-10-CM

## 2013-12-23 DIAGNOSIS — Z8739 Personal history of other diseases of the musculoskeletal system and connective tissue: Secondary | ICD-10-CM | POA: Insufficient documentation

## 2013-12-23 MED ORDER — TETANUS-DIPHTH-ACELL PERTUSSIS 5-2.5-18.5 LF-MCG/0.5 IM SUSP
0.5000 mL | Freq: Once | INTRAMUSCULAR | Status: DC
  Filled 2013-12-23: qty 0.5

## 2013-12-23 MED ORDER — ACETAMINOPHEN 325 MG PO TABS
650.0000 mg | ORAL_TABLET | Freq: Once | ORAL | Status: AC
  Administered 2013-12-23: 650 mg via ORAL
  Filled 2013-12-23: qty 2

## 2013-12-23 MED ORDER — TETANUS-DIPHTHERIA TOXOIDS TD 5-2 LFU IM INJ
0.5000 mL | INJECTION | Freq: Once | INTRAMUSCULAR | Status: AC
Start: 2013-12-23 — End: 2013-12-23
  Administered 2013-12-23: 0.5 mL via INTRAMUSCULAR
  Filled 2013-12-23: qty 0.5

## 2013-12-23 NOTE — ED Notes (Signed)
[facility]

## 2013-12-23 NOTE — ED Notes (Addendum)
[facility], no LOC, has large hematoma to right forehead, (Plavix), is at neuro baseline on arrival, hypertensive, 20g Left forearm, NAD

## 2013-12-23 NOTE — ED Provider Notes (Signed)
CSN: [id number]     Arrival date & time 12/23/13  1845 History   First MD Initiated Contact with Patient 12/23/13 1849     Chief Complaint  Patient presents with  . Fall   (Consider location/radiation/quality/duration/timing/severity/associated sxs/prior Treatment) The history is provided by the patient and the EMS personnel.  Alison Dodson is a 80 y.o. female hx of CAD, dementia here with fall. She has frequent falls and is living in a nursing home. She was in a wheelchair and fell forward and hit her head. As per nursing home staff did not pass out. She is complaining of headache and neck pain and right knee pain. She is on plavix but has been falling very often.    Level V caveat- dementia   Past Medical History  Diagnosis Date  . Anxiety   . Coronary artery disease   . Depression   . Hypertension   . Tachy-brady syndrome   . Permanent atrial fibrillation   . HX: anticoagulation   . Glaucoma   . Dizziness   . SSS (sick sinus syndrome)   . GERD (gastroesophageal reflux disease)   . Fall   . Osteoarthritis   . Dementia    Past Surgical History  Procedure Laterality Date  . Insert / replace / remove pacemaker  01/24/2008    DDD PACEMAKER IMPLANT  . Cholecystectomy    . Gastric bypass    . Breast reduction surgery    . [place] echocardiography  08/10/2007    EF 60%  . [place] echocardiography  01/12/2007    EF 70%  . Cardiovascular stress test  02/15/2007  . Total knee arthroplasty      right  . Pacemaker insertion     Family History  Problem Relation Age of Onset  . Kidney failure Mother   . Heart attack Father   . Heart attack Sister   . Heart attack Brother    History  Substance Use Topics  . Smoking status: Former Smoker    Quit date: 07/17/1961  . Smokeless tobacco: Not on file  . Alcohol Use: No   OB History   Grav Para Term Preterm Abortions TAB SAB Ect Mult Living                 Review of Systems  Musculoskeletal: Positive for back pain and neck pain.   Skin: Positive for wound.  Neurological: Positive for headaches.  All other systems reviewed and are negative.    Allergies  Codeine and Iodine  Home Medications   Current Outpatient Rx  Name  Route  Sig  Dispense  Refill  . atenolol (TENORMIN) 50 MG tablet   Oral   Take 50 mg by mouth daily before breakfast.          . azelastine (OPTIVAR) 0.05 % ophthalmic solution   Both Eyes   Place 1 drop into both eyes every 12 (twelve) hours.          Marland Kitchen. buPROPion (WELLBUTRIN XL) 300 MG 24 hr tablet   Oral   Take 300 mg by mouth daily.         . cholecalciferol (VITAMIN D) 1000 UNITS tablet   Oral   Take 1,000 Units by mouth daily.         . clopidogrel (PLAVIX) 75 MG tablet   Oral   Take 1 tablet (75 mg total) by mouth daily with breakfast.         . Cranberry-Vitamin C-Vitamin E (CRANBERRY  PLUS VITAMIN C PO)   Oral   Take 2 capsules by mouth at bedtime.         . docusate sodium (STOOL SOFTENER) 100 MG capsule   Oral   Take 100 mg by mouth 2 (two) times daily.          Marland Kitchen donepezil (ARICEPT) 10 MG tablet   Oral   Take 10 mg by mouth every evening.          . haloperidol (HALDOL) 0.5 MG tablet   Oral   Take 0.5 mg by mouth every 6 (six) hours as needed for agitation.         . hydroxypropyl methylcellulose (ISOPTO TEARS) 2.5 % ophthalmic solution   Both Eyes   Place 1 drop into both eyes See admin instructions. Twice a day scheduled as well as "as needed for dry eyes"         . latanoprost (XALATAN) 0.005 % ophthalmic solution   Both Eyes   Place 1 drop into both eyes daily.         Marland Kitchen lisinopril (PRINIVIL,ZESTRIL) 20 MG tablet   Oral   Take 20 mg by mouth daily.         . mirtazapine (REMERON) 15 MG tablet   Oral   Take 15 mg by mouth at bedtime.         . pantoprazole (PROTONIX) 40 MG tablet   Oral   Take 40 mg by mouth daily.          . QUEtiapine (SEROQUEL) 25 MG tablet   Oral   Take 25 mg by mouth 2 (two) times daily.          . traMADol (ULTRAM) 50 MG tablet   Oral   Take 1 tablet (50 mg total) by mouth every 6 (six) hours as needed for pain.   15 tablet   0    BP 198/136  Temp(Src) 98.1 F (36.7 C) (Oral)  Resp 16  SpO2 98% Physical Exam  Nursing note and vitals reviewed. Constitutional:  Chronically ill, demented.   HENT:  Head: Normocephalic.  Mouth/Throat: Oropharynx is clear and moist.  Obvious R forehead hematoma, no active bleeding   Eyes: Conjunctivae are normal. Pupils are equal, round, and reactive to light.  Neck: Normal range of motion. Neck supple.  Cardiovascular: Normal rate, regular rhythm and normal heart sounds.   Pulmonary/Chest: Effort normal and breath sounds normal. No respiratory distress. She has no wheezes. She has no rales.  Abdominal: Soft. Bowel sounds are normal. She exhibits no distension. There is no tenderness. There is no rebound.  Musculoskeletal:  No spinal tenderness. Dec ROM L hip. Dec ROM L knee   Neurological: She is alert.  Demented, moving all extremities   Skin: Skin is warm and dry.  Psychiatric:  Unable     ED Course  Procedures (including critical care time) Labs Review Labs Reviewed - No data to display Imaging Review Dg Pelvis 1-2 Views  12/23/2013   CLINICAL DATA:  Status post fall.  Right knee and pelvic pain.  EXAM: PELVIS - 1-2 VIEW  COMPARISON:  None.  FINDINGS: No acute bony or joint abnormality is identified. Evaluation of the right hip is limited as it is externally rotated. Bilateral hip degenerative change is seen. Remote left pubic bone fractures are noted.  IMPRESSION: No acute finding. Evaluation of the right hip is limited due to external rotation.  Bilateral hip osteoarthritis.  Remote left pubic bone fractures.  Electronically Signed   By: Drusilla Kanner M.D.   On: 12/23/2013 19:55   Ct Head Wo Contrast  12/23/2013   CLINICAL DATA:  Fall from wheelchair. Hematoma to the right side of the forehead.  EXAM: CT HEAD WITHOUT  CONTRAST  CT CERVICAL SPINE WITHOUT CONTRAST  TECHNIQUE: Multidetector CT imaging of the head and cervical spine was performed following the standard protocol without intravenous contrast. Multiplanar CT image reconstructions of the cervical spine were also generated.  COMPARISON:  CT C SPINE W/O CM dated 12/23/2013; CT HEAD W/O CM dated 11/30/2013; CT C SPINE W/O CM dated 11/30/2013; CT C SPINE W/O CM dated 08/23/2013; CT HEAD W/O CM dated 08/23/2013  FINDINGS: CT HEAD FINDINGS  Very large right frontal and temporal scalp hematoma without underlying skull fracture. Severe cortical and deep atrophy, unchanged. Old stroke in the left superior cerebellar hemisphere, unchanged. Severe changes of small vessel disease of the white matter diffusely, unchanged. No mass lesion. No midline shift. No acute hemorrhage or hematoma. No extra-axial fluid collections. No evidence of acute infarction. Physiologic calcification the right basal ganglia. No significant interval change.  Visualized paranasal sinuses, bilateral mastoid air cells, and bilateral middle ear cavities well-aerated. Bilateral carotid siphon and vertebral artery atherosclerosis.  CT CERVICAL SPINE FINDINGS  No fractures identified involving the cervical spine. Sagittal reconstructed images demonstrate slight straightening of the usual cervical lordosis and exaggeration of the usual thoracic kyphosis. Disc space narrowing, endplate hypertrophic changes, and ossification in the posterior annular fibers at C5-6. Disc space narrowing at T1-T2. No spinal stenosis. Remaining disc spaces well preserved. Facet joints intact throughout with severe diffuse degenerative changes. No spinal stenosis. Coronal reformatted images demonstrate an intact craniocervical junction, intact C1-C2 articulation, intact dens, and intact lateral masses throughout. Combination of facet and uncinate hypertrophy, predominantly facet hypertrophy, account for multilevel foraminal stenoses including  moderate right C2-3, severe left and moderate right C3-4, moderate right C5-6, moderate bilateral C6-7, severe left C7-T1. Dystrophic calcification at C1-C2 on the right.  IMPRESSION: CT Head:  1. Very large right frontal and temporal scalp hematoma without underlying skull fracture. 2. No acute intracranial abnormality. 3. Severe generalized atrophy and severe chronic microvascular ischemic changes of the white matter, stable. 4. Old stroke in the left superior cerebellar hemisphere.  CT Cervical Spine:  1. No cervical spine fractures identified. 2. Degenerative disc disease and spondylosis at C5-6 and T1-2. No spinal stenosis. 3. Severe diffuse facet degenerative changes resulting in multilevel foraminal stenoses as detailed above.   Electronically Signed   By: Hulan Saas M.D.   On: 12/23/2013 19:38   Ct Cervical Spine Wo Contrast  12/23/2013   CLINICAL DATA:  Fall from wheelchair. Hematoma to the right side of the forehead.  EXAM: CT HEAD WITHOUT CONTRAST  CT CERVICAL SPINE WITHOUT CONTRAST  TECHNIQUE: Multidetector CT imaging of the head and cervical spine was performed following the standard protocol without intravenous contrast. Multiplanar CT image reconstructions of the cervical spine were also generated.  COMPARISON:  CT C SPINE W/O CM dated 12/23/2013; CT HEAD W/O CM dated 11/30/2013; CT C SPINE W/O CM dated 11/30/2013; CT C SPINE W/O CM dated 08/23/2013; CT HEAD W/O CM dated 08/23/2013  FINDINGS: CT HEAD FINDINGS  Very large right frontal and temporal scalp hematoma without underlying skull fracture. Severe cortical and deep atrophy, unchanged. Old stroke in the left superior cerebellar hemisphere, unchanged. Severe changes of small vessel disease of the white matter diffusely, unchanged. No mass lesion. No midline shift.  No acute hemorrhage or hematoma. No extra-axial fluid collections. No evidence of acute infarction. Physiologic calcification the right basal ganglia. No significant interval change.   Visualized paranasal sinuses, bilateral mastoid air cells, and bilateral middle ear cavities well-aerated. Bilateral carotid siphon and vertebral artery atherosclerosis.  CT CERVICAL SPINE FINDINGS  No fractures identified involving the cervical spine. Sagittal reconstructed images demonstrate slight straightening of the usual cervical lordosis and exaggeration of the usual thoracic kyphosis. Disc space narrowing, endplate hypertrophic changes, and ossification in the posterior annular fibers at C5-6. Disc space narrowing at T1-T2. No spinal stenosis. Remaining disc spaces well preserved. Facet joints intact throughout with severe diffuse degenerative changes. No spinal stenosis. Coronal reformatted images demonstrate an intact craniocervical junction, intact C1-C2 articulation, intact dens, and intact lateral masses throughout. Combination of facet and uncinate hypertrophy, predominantly facet hypertrophy, account for multilevel foraminal stenoses including moderate right C2-3, severe left and moderate right C3-4, moderate right C5-6, moderate bilateral C6-7, severe left C7-T1. Dystrophic calcification at C1-C2 on the right.  IMPRESSION: CT Head:  1. Very large right frontal and temporal scalp hematoma without underlying skull fracture. 2. No acute intracranial abnormality. 3. Severe generalized atrophy and severe chronic microvascular ischemic changes of the white matter, stable. 4. Old stroke in the left superior cerebellar hemisphere.  CT Cervical Spine:  1. No cervical spine fractures identified. 2. Degenerative disc disease and spondylosis at C5-6 and T1-2. No spinal stenosis. 3. Severe diffuse facet degenerative changes resulting in multilevel foraminal stenoses as detailed above.   Electronically Signed   By: Hulan Saas M.D.   On: 12/23/2013 19:38   Dg Knee Complete 4 Views Right  12/23/2013   CLINICAL DATA:  Fall, right knee pain.  EXAM: RIGHT KNEE - COMPLETE 4+ VIEW  COMPARISON:  None.  FINDINGS:  Right total knee arthroplasty is identified. The device is located. Hardware is intact. There is no fracture. No joint effusion.  IMPRESSION: No acute finding. Right total knee replacement without evidence of complication.   Electronically Signed   By: Drusilla Kanner M.D.   On: 12/23/2013 19:56    EKG Interpretation   None       MDM  No diagnosis found. Alison Dodson is a 80 y.o. female here with fall. Mechanical fall, not syncope. Will get CT head/neck, xrays.   9:22 PM CT head/neck, xrays unremarkable. Tetanus updated. Stable to d/c back to facility.    Richardean Canal, MD 12/23/13 2123

## 2013-12-23 NOTE — ED Notes (Signed)
MD aware of pt's BP at discharge

## 2013-12-23 NOTE — Discharge Instructions (Signed)
You have scalp hematoma.   Apply ice to area twice a day as needed.   You may take tylenol 500 mg every 6 hrs as needed for pain.   Follow up with your doctor.   Return to ER if you have severe pain, bleeding, headaches.

## 2014-02-28 ENCOUNTER — Encounter: Payer: Self-pay | Admitting: *Deleted

## 2014-05-14 ENCOUNTER — Encounter: Payer: Self-pay | Admitting: Internal Medicine

## 2014-06-04 ENCOUNTER — Encounter: Payer: Medicare Other | Admitting: Internal Medicine

## 2014-06-11 ENCOUNTER — Encounter: Payer: Self-pay | Admitting: Internal Medicine

## 2014-06-11 ENCOUNTER — Ambulatory Visit (INDEPENDENT_AMBULATORY_CARE_PROVIDER_SITE_OTHER): Payer: Medicare Other | Admitting: Internal Medicine

## 2014-06-11 VITALS — BP 178/96 | HR 68 | Ht 63.0 in

## 2014-06-11 DIAGNOSIS — I4891 Unspecified atrial fibrillation: Secondary | ICD-10-CM

## 2014-06-11 DIAGNOSIS — I495 Sick sinus syndrome: Secondary | ICD-10-CM

## 2014-06-11 DIAGNOSIS — I10 Essential (primary) hypertension: Secondary | ICD-10-CM

## 2014-06-11 DIAGNOSIS — I482 Chronic atrial fibrillation, unspecified: Secondary | ICD-10-CM

## 2014-06-11 LAB — MDC_IDC_ENUM_SESS_TYPE_INCLINIC
Brady Statistic RV Percent Paced: 42 %
Date Time Interrogation Session: 20150722164224
Lead Channel Impedance Value: 0 Ohm
Lead Channel Impedance Value: 413 Ohm
Lead Channel Pacing Threshold Pulse Width: 0.4 ms
Lead Channel Sensing Intrinsic Amplitude: 8 mV
Lead Channel Setting Pacing Amplitude: 2.5 V
Lead Channel Setting Pacing Pulse Width: 0.4 ms
MDC IDC MSMT BATTERY IMPEDANCE: 204 Ohm
MDC IDC MSMT BATTERY REMAINING LONGEVITY: 106 mo
MDC IDC MSMT BATTERY VOLTAGE: 2.78 V
MDC IDC MSMT LEADCHNL RV PACING THRESHOLD AMPLITUDE: 1.25 V
MDC IDC SET LEADCHNL RV SENSING SENSITIVITY: 2.8 mV

## 2014-06-11 NOTE — Patient Instructions (Signed)
Your physician wants you to follow-up in: 12 months with Dr Allred You will receive a reminder letter in the mail two months in advance. If you don't receive a letter, please call our office to schedule the follow-up appointment.  

## 2014-06-15 NOTE — Progress Notes (Signed)
PCP: Ron ParkerBOWEN,SAMUEL, MD Primary Cardiologist:  Dr [place]Jordan   Alison Dodson is a 80 y.o. female who presents today for routine electrophysiology followup.   Today, she denies symptoms of palpitations, chest pain, shortness of breath,  lower extremity edema, dizziness, presyncope, or syncope.  She has some dementia and is advancing in age. The patient is otherwise without complaint today.   Past Medical History  Diagnosis Date  . Anxiety   . Coronary artery disease   . Depression   . Hypertension   . Tachy-brady syndrome   . Permanent atrial fibrillation   . HX: anticoagulation   . Glaucoma   . Dizziness   . SSS (sick sinus syndrome)   . GERD (gastroesophageal reflux disease)   . Fall   . Osteoarthritis   . Dementia    Past Surgical History  Procedure Laterality Date  . Insert / replace / remove pacemaker  01/24/2008    DDD PACEMAKER IMPLANT  . Cholecystectomy    . Gastric bypass    . Breast reduction surgery    . [place] echocardiography  08/10/2007    EF 60%  . [place] echocardiography  01/12/2007    EF 70%  . Cardiovascular stress test  02/15/2007  . Total knee arthroplasty      right  . Pacemaker insertion      Current Outpatient Prescriptions  Medication Sig Dispense Refill  . atenolol (TENORMIN) 50 MG tablet Take 50 mg by mouth daily before breakfast.       . azelastine (OPTIVAR) 0.05 % ophthalmic solution Place 1 drop into both eyes 2 (two) times daily.       Marland Kitchen. buPROPion (WELLBUTRIN XL) 300 MG 24 hr tablet Take 300 mg by mouth daily.      . carboxymethylcellulose (LUBRICANT EYE DROPS) 0.5 % SOLN Place 1 drop into both eyes 2 (two) times daily.      . cholecalciferol (VITAMIN D) 1000 UNITS tablet Take 1,000 Units by mouth daily.      . clopidogrel (PLAVIX) 75 MG tablet Take 1 tablet (75 mg total) by mouth daily with breakfast.      . Cranberry-Vitamin C-Vitamin E (CRANBERRY PLUS VITAMIN C PO) Take 2 capsules by mouth at bedtime.      . docusate sodium (STOOL SOFTENER) 100 MG  capsule Take 100 mg by mouth 2 (two) times daily.       Marland Kitchen. donepezil (ARICEPT) 10 MG tablet Take 10 mg by mouth every evening.       . haloperidol (HALDOL) 0.5 MG tablet Take 0.5 mg by mouth every 6 (six) hours as needed for agitation.      Marland Kitchen. latanoprost (XALATAN) 0.005 % ophthalmic solution Place 1 drop into both eyes at bedtime.       Marland Kitchen. lisinopril (PRINIVIL,ZESTRIL) 20 MG tablet Take 20 mg by mouth daily.      . mirtazapine (REMERON) 15 MG tablet Take 15 mg by mouth at bedtime.      . pantoprazole (PROTONIX) 40 MG tablet Take 40 mg by mouth daily.       . QUEtiapine (SEROQUEL) 25 MG tablet Take 25 mg by mouth 2 (two) times daily.      . traMADol (ULTRAM) 50 MG tablet Take 1 tablet (50 mg total) by mouth every 6 (six) hours as needed for pain.  15 tablet  0   No current facility-administered medications for this visit.   Facility-Administered Medications Ordered in Other Visits  Medication Dose Route Frequency Provider Last Rate  Last Dose  . 0.45 % sodium chloride infusion   Intravenous Continuous Hillis Range, MD      . 0.9 %  sodium chloride infusion  250 mL Intravenous Continuous Hillis Range, MD      . chlorhexidine (HIBICLENS) 4 % liquid 4 application  60 mL Topical Once Hillis Range, MD      . sodium chloride 0.9 % injection 3 mL  3 mL Intravenous Q12H Hillis Range, MD      . sodium chloride 0.9 % injection 3 mL  3 mL Intravenous PRN Hillis Range, MD        Physical Exam: Filed Vitals:   06/11/14 1447  BP: 178/96  Pulse: 68  Height: 5\' 3"  (1.6 m)    GEN- The patient is elderly appearing, alert and oriented x 3 today.   Head- normocephalic, atraumatic Eyes-  Sclera clear, conjunctiva pink Ears- hearing intact Oropharynx- clear Lungs- Clear to ausculation bilaterally, normal work of breathing Chest- pacemaker pocket is well healed Heart- irregular rate and rhythm  GI- soft, NT, ND, + BS Extremities- no clubbing, cyanosis, or edema  Pacemaker interrogation- reviewed in  detail today,  See PACEART report  Assessment and Plan:  1. Bradycardia/ tachycardia syndrome Normal pacemaker function See Pace Art report No changes today  2. Permanent afib Her CHADS2VASC score is at least 7.  She should therefore be anticoagulated long term.  The patient and her daughter decline anticoagulation.  I have informed them that bleeding risks are likely higher with plavix than eliquis (as we discussed last visit).  They remain reluctant to make cahnges today.  3. CAD Stable No change required today  4. HTN They report good bleed pressure at home.  Return in 12 months for device follow-up

## 2014-08-05 ENCOUNTER — Encounter: Payer: Self-pay | Admitting: Cardiology

## 2014-08-08 NOTE — Progress Notes (Signed)
No as long as patient is seeing a Development worker, international aid.  I reviewed the note from Dr. Johney Frame and he addressed all cardiac issues

## 2014-10-04 IMAGING — CT CT HEAD W/O CM
3 of 5 series · 16 of 37 positions shown, 18 images · non-contrast
Comparison: Head CT and cervical spine CT 07/28/2012.

CT HEAD

CLINICAL DATA: History of fall complaining of neck pain.

CT HEAD WITHOUT CONTRAST
CT CERVICAL SPINE WITHOUT CONTRAST
TECHNIQUE: Multidetector CT imaging of the head and cervical spine
was performed following the standard protocol without intravenous
contrast.  Multiplanar CT image reconstructions of the cervical
spine were also generated.

[Series 4: bone windows · axial · 0.43mm/px · z∈[-223,-123]mm · 6 of 81 slices shown]
[im 11/81  bone]
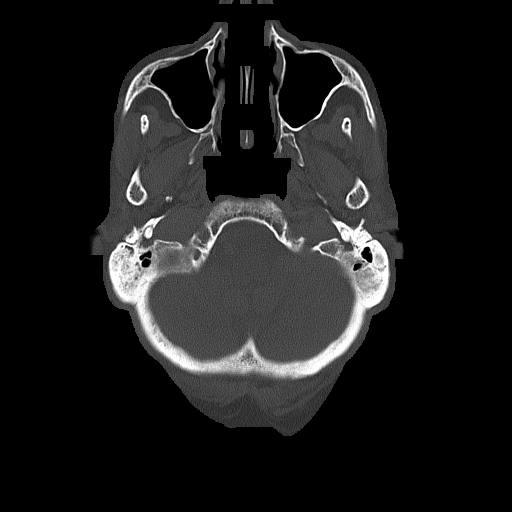
[im 21/81  bone]
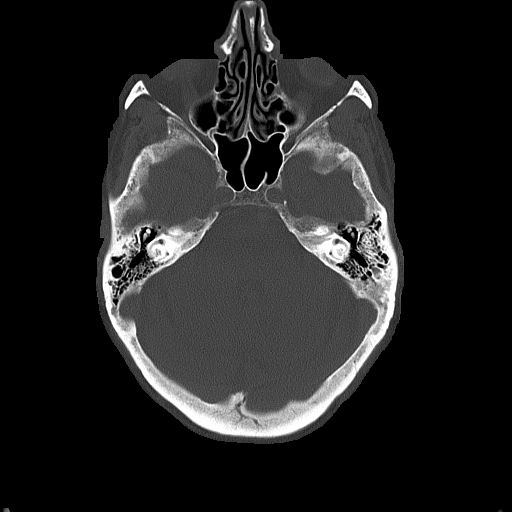
[im 31/81  bone]
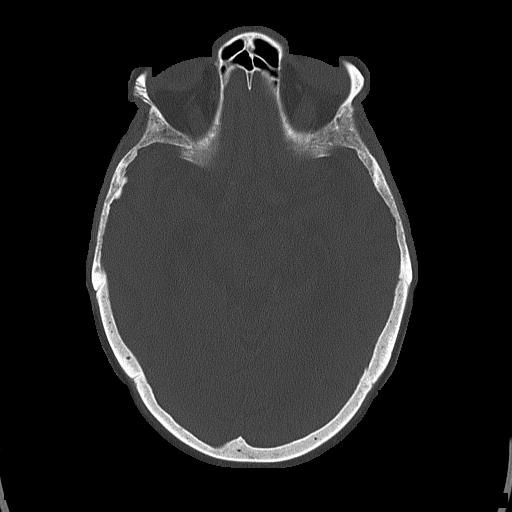
[im 41/81  bone]
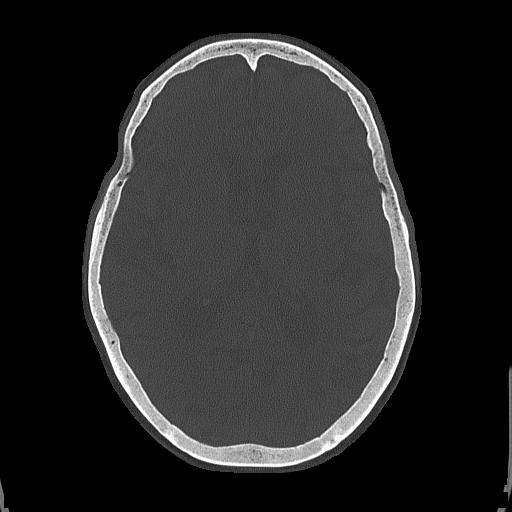
[im 51/81  bone]
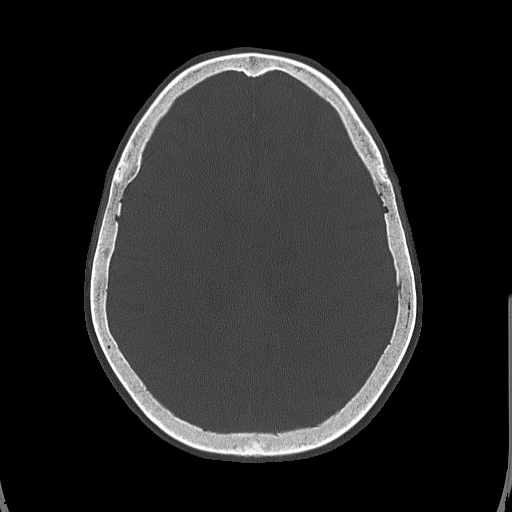
[im 61/81  bone]
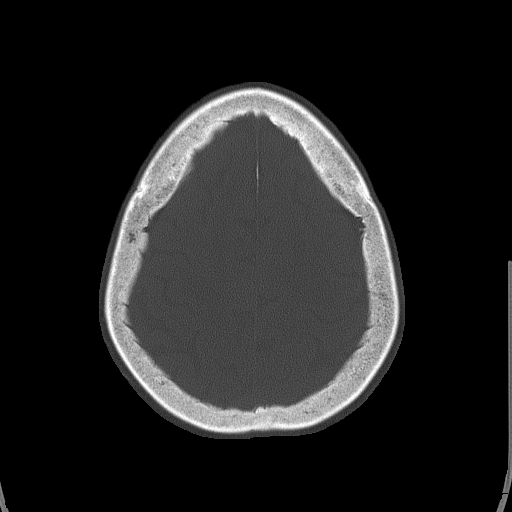

[Series 604: <mpr thick range(2)> · axial · 0.33mm/px · z∈[-411,-304]mm · 7 of 87 slices shown, 9 images]
[im 11/87  brain]
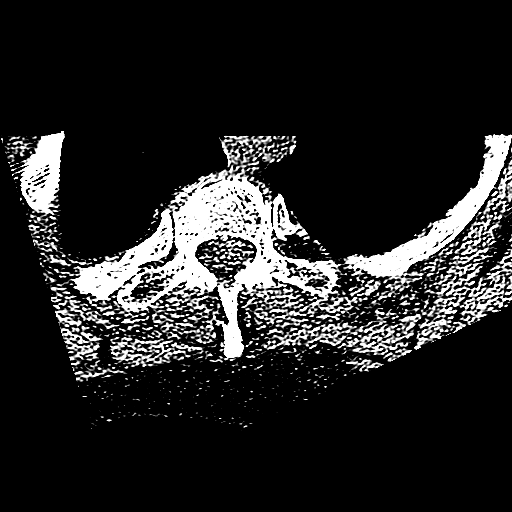
[im 11/87  bone]
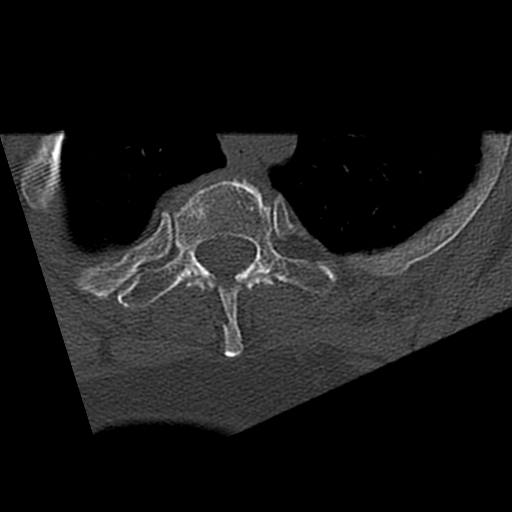
[im 22/87  brain]
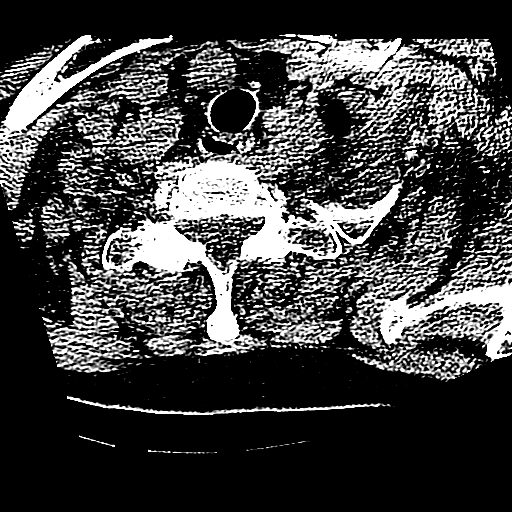
[im 33/87  brain]
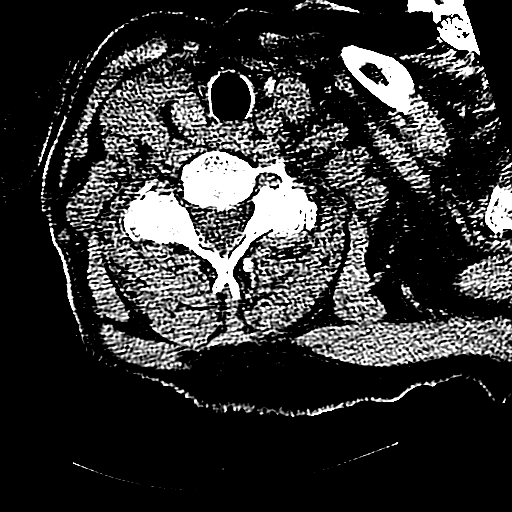
[im 44/87  brain]
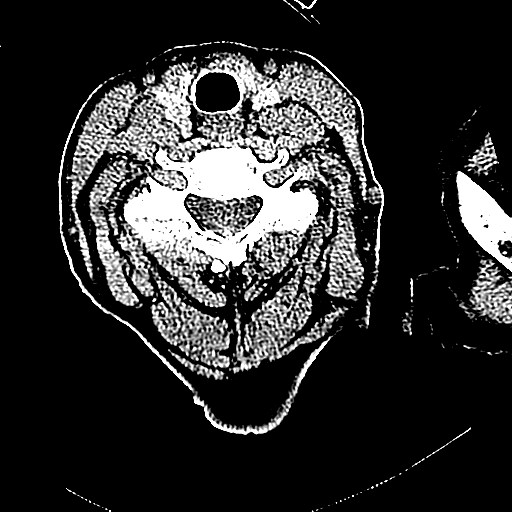
[im 54/87  brain]
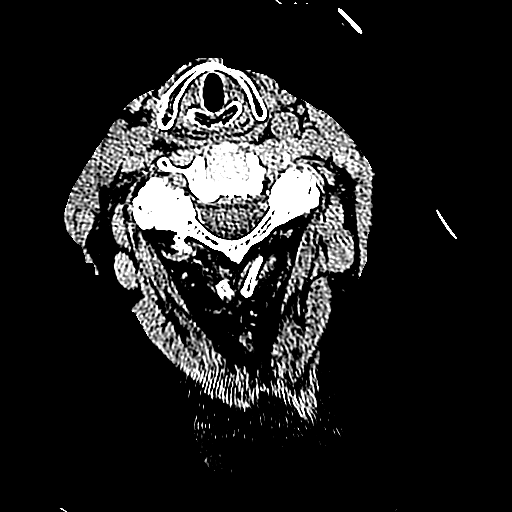
[im 54/87  bone]
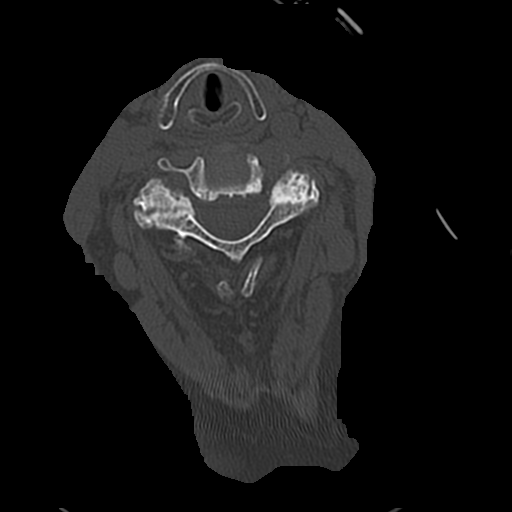
[im 65/87  brain]
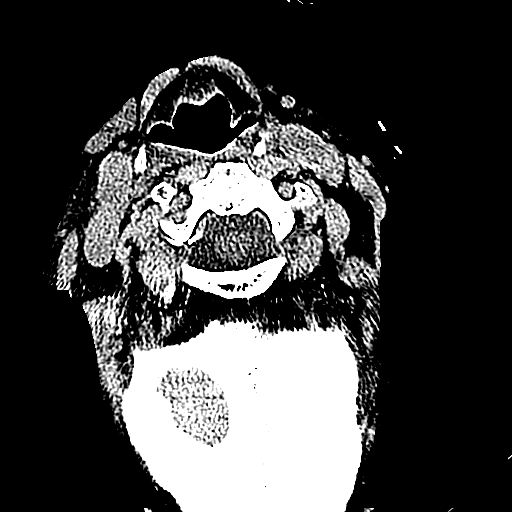
[im 76/87  brain]
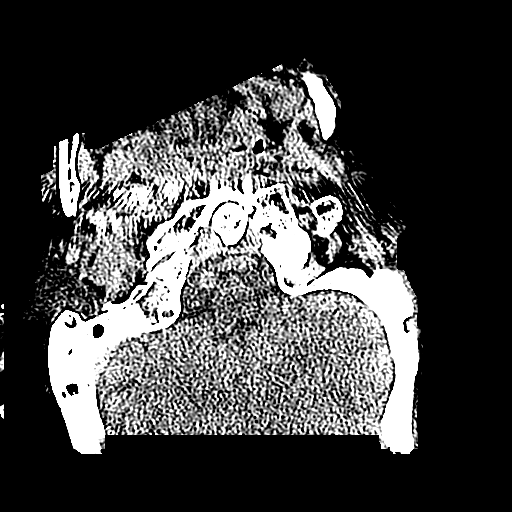

[Series 605: <mpr thick range(3)> · sagittal · 0.33mm/px · 3 of 46 slices shown]
[im 16/46  brain]
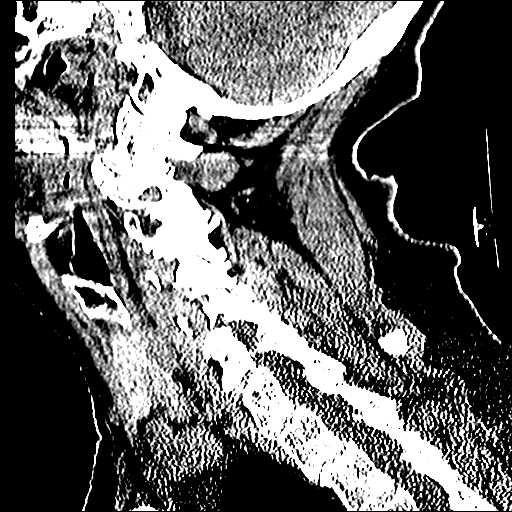
[im 23/46  brain]
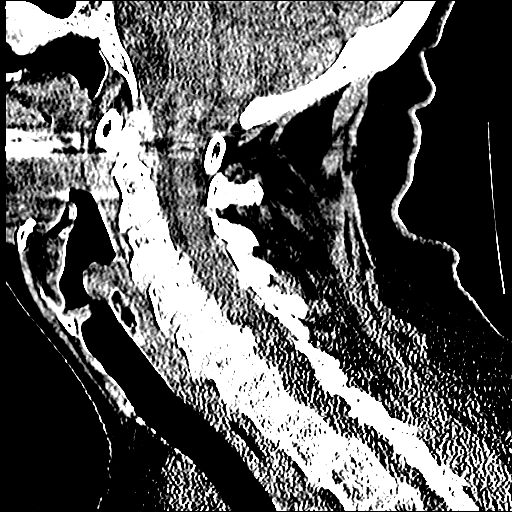
[im 31/46  brain]
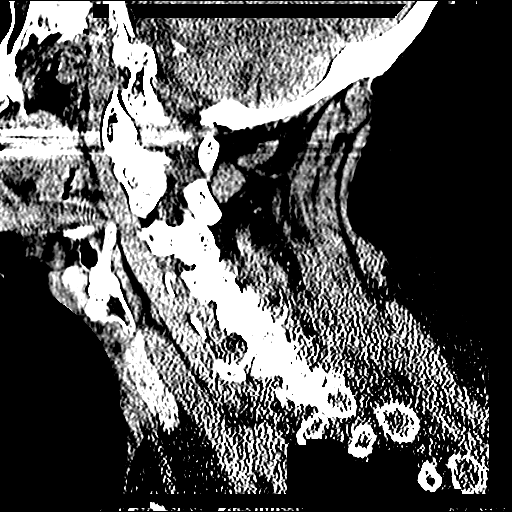

[16 of 37 positions shown; findings below may reference images not displayed]

FINDINGS: Mild cerebral and cerebellar atrophy is unchanged.  Old
lacunar infarction in the left cerebellar hemisphere is unchanged.
Extensive patchy and confluent areas of decreased attenuation
throughout the deep and periventricular white matter of the
cerebral hemispheres bilaterally is compatible with advanced
chronic microvascular ischemic disease, and is similar to the prior
examination. No acute displaced skull fractures are identified.  No
acute intracranial abnormality.  Specifically, no evidence of acute
post-traumatic intracranial hemorrhage, no definite regions of
acute/subacute cerebral ischemia, no focal mass, mass effect,
hydrocephalus or abnormal intra or extra-axial fluid collections.
The visualized paranasal sinuses and mastoids are well pneumatized.
IMPRESSION: 1.  No evidence of significant acute traumatic injury to the head
or brain.
2.  Cerebral and cerebellar atrophy with advanced chronic
microvascular ischemic changes in the cerebral white matter and old
lacunar infarction in the left cerebellar hemisphere.  Appearance
is similar to the recent prior study 07/28/2012.

CT CERVICAL SPINE
FINDINGS: No acute displaced cervical spine fracture is identified.
There is severe multilevel degenerative disc disease and cervical
spondylosis, there is multilevel degenerative disc disease, most
severe at C5-C6, and severe multilevel facet arthropathy. 3 mm of
anterolisthesis of C5 upon C6 and 3 mm of anterolisthesis of T1
upon T2 are unchanged.  Alignment is otherwise anatomic.
Prevertebral soft tissues are normal.  Visualized portions of the
lung apices are unremarkable.
IMPRESSION: 1.  No evidence of significant acute traumatic injury to the
cervical spine.
2.  Multilevel degenerative disc disease and cervical spondylosis,
as above, similar to prior examinations..

## 2014-10-04 IMAGING — CR DG PORTABLE PELVIS
1 series · 1 of 1 positions shown · non-contrast
Comparison: CT of the pelvis of 07/28/2012

CLINICAL DATA: Fell today with pain

PORTABLE PELVIS

[AP]
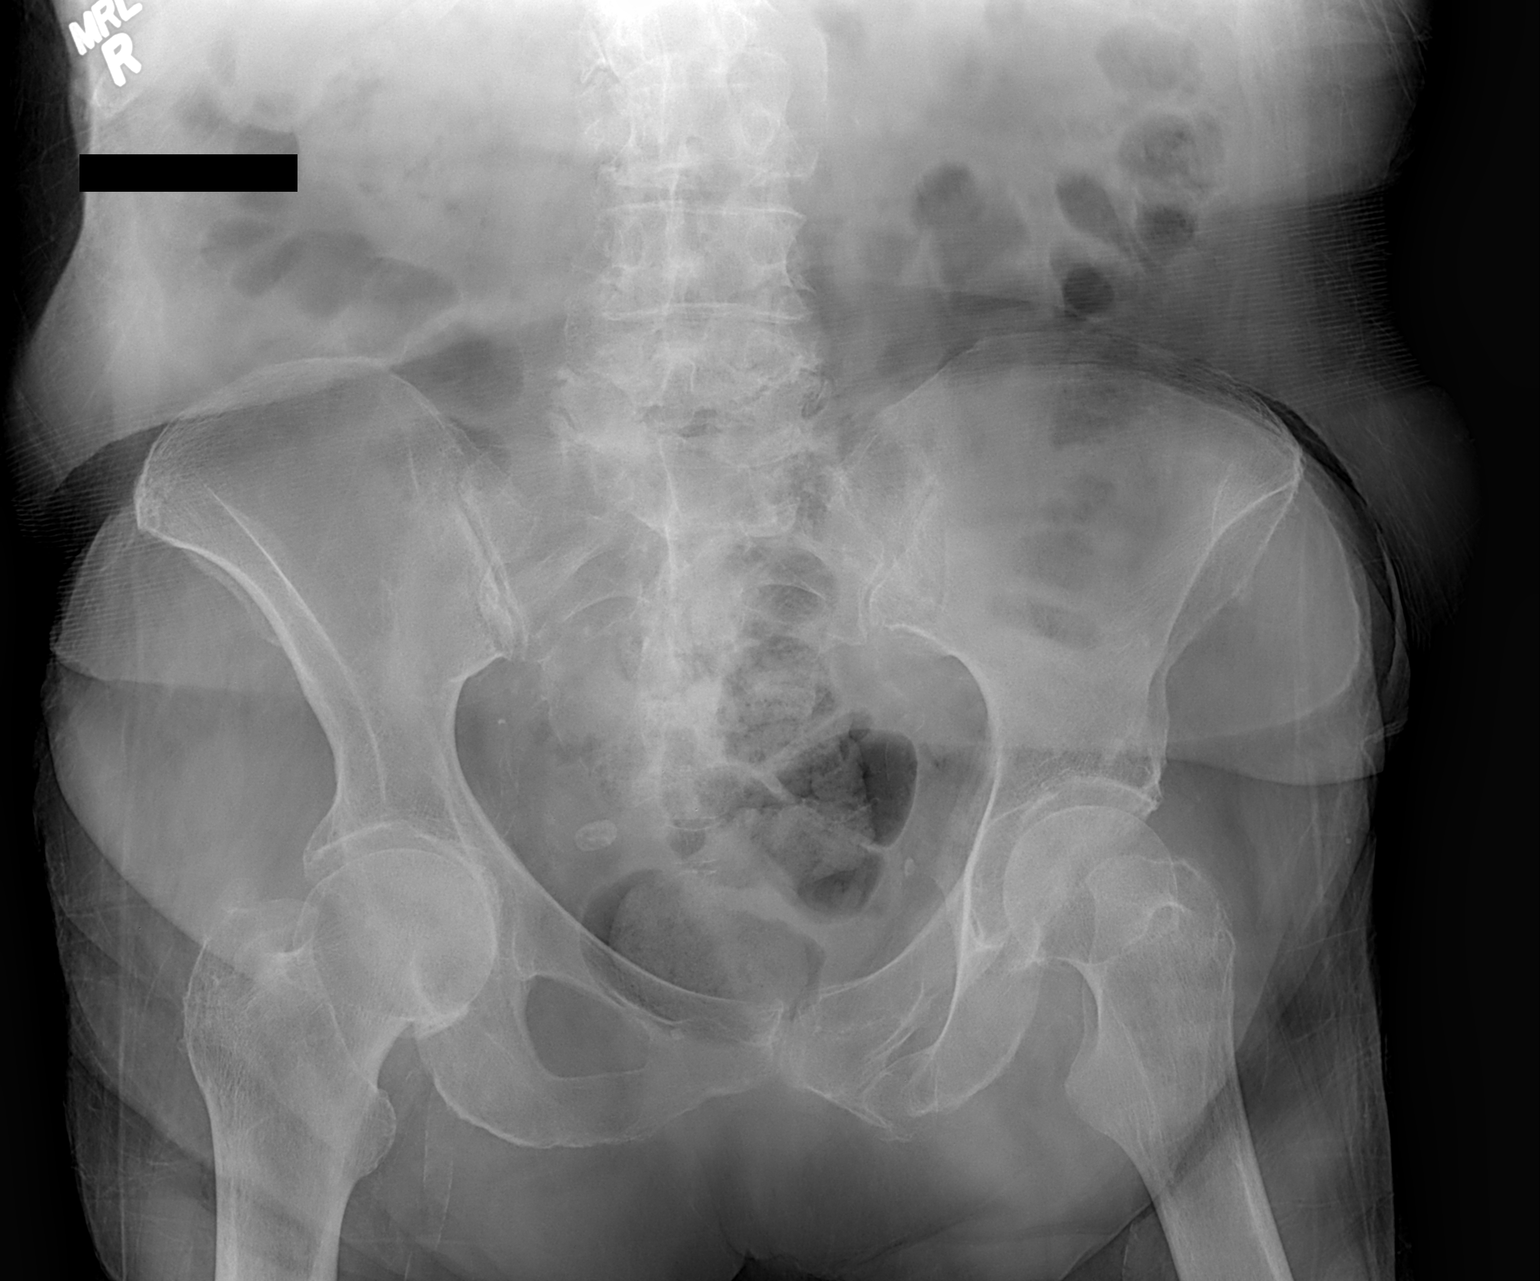

[1 of 1 positions shown; findings below may reference images not displayed]

FINDINGS: There is cortical irregularity of the left inferior
pelvic ramus suspicious for nondisplaced fracture.  No other acute
abnormality is seen.  The SI joints appear symmetrical and normal.
There are degenerative changes in both hips and in the lumbar
spine.
IMPRESSION: Apparent nondisplaced fracture of the left inferior pelvic ramus.

## 2014-10-30 ENCOUNTER — Encounter (HOSPITAL_COMMUNITY): Payer: Self-pay | Admitting: Internal Medicine

## 2016-01-04 ENCOUNTER — Encounter: Payer: Self-pay | Admitting: *Deleted

## 2016-02-05 ENCOUNTER — Observation Stay (HOSPITAL_COMMUNITY): Payer: Medicare Other

## 2016-02-05 ENCOUNTER — Observation Stay (HOSPITAL_COMMUNITY)
Admission: EM | Admit: 2016-02-05 | Discharge: 2016-02-07 | Disposition: A | Discharge status: Nursing Home (Includes ICF) | Payer: Medicare Other | Attending: Internal Medicine | Admitting: Internal Medicine

## 2016-02-05 ENCOUNTER — Encounter (HOSPITAL_COMMUNITY): Payer: Self-pay | Admitting: Emergency Medicine

## 2016-02-05 ENCOUNTER — Emergency Department (HOSPITAL_COMMUNITY): Payer: Medicare Other

## 2016-02-05 DIAGNOSIS — M19012 Primary osteoarthritis, left shoulder: Secondary | ICD-10-CM | POA: Insufficient documentation

## 2016-02-05 DIAGNOSIS — Z66 Do not resuscitate: Secondary | ICD-10-CM | POA: Diagnosis not present

## 2016-02-05 DIAGNOSIS — R079 Chest pain, unspecified: Principal | ICD-10-CM | POA: Insufficient documentation

## 2016-02-05 DIAGNOSIS — Z9049 Acquired absence of other specified parts of digestive tract: Secondary | ICD-10-CM | POA: Diagnosis not present

## 2016-02-05 DIAGNOSIS — I272 Other secondary pulmonary hypertension: Secondary | ICD-10-CM | POA: Diagnosis not present

## 2016-02-05 DIAGNOSIS — M25512 Pain in left shoulder: Secondary | ICD-10-CM | POA: Diagnosis not present

## 2016-02-05 DIAGNOSIS — G309 Alzheimer's disease, unspecified: Secondary | ICD-10-CM | POA: Diagnosis not present

## 2016-02-05 DIAGNOSIS — Z79899 Other long term (current) drug therapy: Secondary | ICD-10-CM | POA: Diagnosis not present

## 2016-02-05 DIAGNOSIS — K219 Gastro-esophageal reflux disease without esophagitis: Secondary | ICD-10-CM | POA: Insufficient documentation

## 2016-02-05 DIAGNOSIS — I2510 Atherosclerotic heart disease of native coronary artery without angina pectoris: Secondary | ICD-10-CM | POA: Diagnosis present

## 2016-02-05 DIAGNOSIS — F419 Anxiety disorder, unspecified: Secondary | ICD-10-CM | POA: Insufficient documentation

## 2016-02-05 DIAGNOSIS — I482 Chronic atrial fibrillation, unspecified: Secondary | ICD-10-CM | POA: Diagnosis present

## 2016-02-05 DIAGNOSIS — I25119 Atherosclerotic heart disease of native coronary artery with unspecified angina pectoris: Secondary | ICD-10-CM | POA: Diagnosis not present

## 2016-02-05 DIAGNOSIS — Z87891 Personal history of nicotine dependence: Secondary | ICD-10-CM | POA: Insufficient documentation

## 2016-02-05 DIAGNOSIS — Z7902 Long term (current) use of antithrombotics/antiplatelets: Secondary | ICD-10-CM | POA: Diagnosis not present

## 2016-02-05 DIAGNOSIS — I071 Rheumatic tricuspid insufficiency: Secondary | ICD-10-CM | POA: Diagnosis present

## 2016-02-05 DIAGNOSIS — Z96651 Presence of right artificial knee joint: Secondary | ICD-10-CM | POA: Diagnosis not present

## 2016-02-05 DIAGNOSIS — I4892 Unspecified atrial flutter: Secondary | ICD-10-CM | POA: Diagnosis not present

## 2016-02-05 DIAGNOSIS — Z950 Presence of cardiac pacemaker: Secondary | ICD-10-CM | POA: Insufficient documentation

## 2016-02-05 DIAGNOSIS — F0280 Dementia in other diseases classified elsewhere without behavioral disturbance: Secondary | ICD-10-CM | POA: Insufficient documentation

## 2016-02-05 DIAGNOSIS — Z9884 Bariatric surgery status: Secondary | ICD-10-CM | POA: Insufficient documentation

## 2016-02-05 DIAGNOSIS — E869 Volume depletion, unspecified: Secondary | ICD-10-CM | POA: Diagnosis not present

## 2016-02-05 DIAGNOSIS — I10 Essential (primary) hypertension: Secondary | ICD-10-CM | POA: Diagnosis not present

## 2016-02-05 DIAGNOSIS — Z993 Dependence on wheelchair: Secondary | ICD-10-CM | POA: Diagnosis not present

## 2016-02-05 DIAGNOSIS — F329 Major depressive disorder, single episode, unspecified: Secondary | ICD-10-CM | POA: Insufficient documentation

## 2016-02-05 LAB — BASIC METABOLIC PANEL
ANION GAP: 9 (ref 5–15)
BUN: 18 mg/dL (ref 6–20)
CO2: 28 mmol/L (ref 22–32)
CREATININE: 1.07 mg/dL — AB (ref 0.44–1.00)
Calcium: 10 mg/dL (ref 8.9–10.3)
Chloride: 107 mmol/L (ref 101–111)
GFR, EST AFRICAN AMERICAN: 53 mL/min — AB (ref >60)
GFR, EST NON AFRICAN AMERICAN: 46 mL/min — AB (ref >60)
Glucose, Bld: 99 mg/dL (ref 65–99)
Potassium: 4.4 mmol/L (ref 3.5–5.1)
Sodium: 144 mmol/L (ref 135–145)

## 2016-02-05 LAB — CBC WITH DIFFERENTIAL/PLATELET
BASOS ABS: 0 10*3/uL (ref 0.0–0.1)
BASOS PCT: 0 %
EOS ABS: 0.1 10*3/uL (ref 0.0–0.7)
Eosinophils Relative: 1 %
HCT: 50 % — ABNORMAL HIGH (ref 36.0–46.0)
Hemoglobin: 16.2 g/dL — ABNORMAL HIGH (ref 12.0–15.0)
Lymphocytes Relative: 16 %
Lymphs Abs: 1.4 10*3/uL (ref 0.7–4.0)
MCH: 29.6 pg (ref 26.0–34.0)
MCHC: 32.4 g/dL (ref 30.0–36.0)
MCV: 91.4 fL (ref 78.0–100.0)
MONO ABS: 0.7 10*3/uL (ref 0.1–1.0)
Monocytes Relative: 8 %
Neutro Abs: 6.5 10*3/uL (ref 1.7–7.7)
Neutrophils Relative %: 75 %
Platelets: 253 10*3/uL (ref 150–400)
RBC: 5.47 MIL/uL — ABNORMAL HIGH (ref 3.87–5.11)
RDW: 14.9 % (ref 11.5–15.5)
WBC: 8.6 10*3/uL (ref 4.0–10.5)

## 2016-02-05 LAB — I-STAT TROPONIN, ED: Troponin i, poc: 0.01 ng/mL (ref 0.00–0.08)

## 2016-02-05 LAB — TROPONIN I

## 2016-02-05 MED ORDER — ATENOLOL 25 MG PO TABS
50.0000 mg | ORAL_TABLET | Freq: Every day | ORAL | Status: DC
  Administered 2016-02-06 – 2016-02-07 (×2): 50 mg via ORAL
  Filled 2016-02-05 (×2): qty 2

## 2016-02-05 MED ORDER — BUPROPION HCL ER (XL) 300 MG PO TB24
300.0000 mg | ORAL_TABLET | Freq: Every day | ORAL | Status: DC
Start: 2016-02-06 — End: 2016-02-07
  Administered 2016-02-06 – 2016-02-07 (×2): 300 mg via ORAL
  Filled 2016-02-05 (×4): qty 1

## 2016-02-05 MED ORDER — PANTOPRAZOLE SODIUM 40 MG PO TBEC
40.0000 mg | DELAYED_RELEASE_TABLET | Freq: Every day | ORAL | Status: DC
  Administered 2016-02-05 – 2016-02-07 (×3): 40 mg via ORAL
  Filled 2016-02-05 (×3): qty 1

## 2016-02-05 MED ORDER — SODIUM CHLORIDE 0.9% FLUSH
3.0000 mL | Freq: Two times a day (BID) | INTRAVENOUS | Status: DC
  Administered 2016-02-06: 3 mL via INTRAVENOUS

## 2016-02-05 MED ORDER — ACETAMINOPHEN 325 MG PO TABS: 650.0000 mg | ORAL_TABLET | ORAL | Status: DC | PRN

## 2016-02-05 MED ORDER — ASPIRIN EC 325 MG PO TBEC
325.0000 mg | DELAYED_RELEASE_TABLET | Freq: Every day | ORAL | Status: DC
  Administered 2016-02-06 – 2016-02-07 (×2): 325 mg via ORAL
  Filled 2016-02-05 (×2): qty 1

## 2016-02-05 MED ORDER — ENOXAPARIN SODIUM 40 MG/0.4ML ~~LOC~~ SOLN
40.0000 mg | SUBCUTANEOUS | Status: DC
  Administered 2016-02-05 – 2016-02-06 (×2): 40 mg via SUBCUTANEOUS
  Filled 2016-02-05 (×2): qty 0.4

## 2016-02-05 MED ORDER — SODIUM CHLORIDE 0.9 % IV SOLN
INTRAVENOUS | Status: AC
  Administered 2016-02-05: 50 mL/h via INTRAVENOUS

## 2016-02-05 MED ORDER — FENTANYL CITRATE (PF) 100 MCG/2ML IJ SOLN
12.5000 ug | INTRAMUSCULAR | Status: DC | PRN
  Administered 2016-02-05: 12.5 ug via INTRAVENOUS
  Filled 2016-02-05: qty 2

## 2016-02-05 MED ORDER — LISINOPRIL 10 MG PO TABS
20.0000 mg | ORAL_TABLET | Freq: Every day | ORAL | Status: DC
  Administered 2016-02-05 – 2016-02-07 (×3): 20 mg via ORAL
  Filled 2016-02-05: qty 2
  Filled 2016-02-05: qty 1
  Filled 2016-02-05: qty 2

## 2016-02-05 MED ORDER — BOOST PLUS PO LIQD
237.0000 mL | Freq: Three times a day (TID) | ORAL | Status: DC
  Administered 2016-02-06 – 2016-02-07 (×3): 237 mL via ORAL
  Filled 2016-02-05 (×10): qty 237

## 2016-02-05 MED ORDER — CLOPIDOGREL BISULFATE 75 MG PO TABS
75.0000 mg | ORAL_TABLET | Freq: Every day | ORAL | Status: DC
  Administered 2016-02-06 – 2016-02-07 (×2): 75 mg via ORAL
  Filled 2016-02-05 (×2): qty 1

## 2016-02-05 MED ORDER — LATANOPROST 0.005 % OP SOLN
1.0000 [drp] | Freq: Every day | OPHTHALMIC | Status: DC
  Administered 2016-02-05 – 2016-02-06 (×2): 1 [drp] via OPHTHALMIC
  Filled 2016-02-05: qty 2.5

## 2016-02-05 MED ORDER — QUETIAPINE FUMARATE 25 MG PO TABS
25.0000 mg | ORAL_TABLET | Freq: Two times a day (BID) | ORAL | Status: DC
  Administered 2016-02-06 – 2016-02-07 (×3): 25 mg via ORAL
  Filled 2016-02-05 (×5): qty 1

## 2016-02-05 MED ORDER — TRAMADOL HCL 50 MG PO TABS: 50.0000 mg | ORAL_TABLET | Freq: Four times a day (QID) | ORAL | Status: DC | PRN

## 2016-02-05 MED ORDER — POLYVINYL ALCOHOL 1.4 % OP SOLN
1.0000 [drp] | Freq: Two times a day (BID) | OPHTHALMIC | Status: DC
  Administered 2016-02-05 – 2016-02-07 (×4): 1 [drp] via OPHTHALMIC
  Filled 2016-02-05: qty 15

## 2016-02-05 MED ORDER — LIDOCAINE 5 % EX PTCH
1.0000 | MEDICATED_PATCH | CUTANEOUS | Status: DC
  Administered 2016-02-06: 1 via TRANSDERMAL
  Filled 2016-02-05: qty 1

## 2016-02-05 MED ORDER — GI COCKTAIL ~~LOC~~
30.0000 mL | Freq: Once | ORAL | Status: AC
  Administered 2016-02-05: 30 mL via ORAL
  Filled 2016-02-05: qty 30

## 2016-02-05 MED ORDER — MIRTAZAPINE 15 MG PO TABS
15.0000 mg | ORAL_TABLET | Freq: Every day | ORAL | Status: DC
Start: 2016-02-05 — End: 2016-02-07
  Administered 2016-02-06: 15 mg via ORAL
  Filled 2016-02-05 (×3): qty 1

## 2016-02-05 MED ORDER — KETOROLAC TROMETHAMINE 15 MG/ML IJ SOLN
15.0000 mg | Freq: Once | INTRAMUSCULAR | Status: AC
  Administered 2016-02-05: 15 mg via INTRAVENOUS
  Filled 2016-02-05: qty 1

## 2016-02-05 MED ORDER — HALOPERIDOL 0.5 MG PO TABS
0.5000 mg | ORAL_TABLET | Freq: Four times a day (QID) | ORAL | Status: DC | PRN
  Filled 2016-02-05: qty 1

## 2016-02-05 MED ORDER — DONEPEZIL HCL 10 MG PO TABS
10.0000 mg | ORAL_TABLET | Freq: Every evening | ORAL | Status: DC
  Administered 2016-02-05 – 2016-02-06 (×2): 10 mg via ORAL
  Filled 2016-02-05: qty 2
  Filled 2016-02-05: qty 1

## 2016-02-05 NOTE — ED Notes (Signed)
[facility] with earlier c/o chest/shoulder pain. Pt alert, but oriented to self only, has hx of Alz. Denies any pain presently, but pulls back when L shoulder touched.

## 2016-02-05 NOTE — Progress Notes (Signed)
CT left shoulder demonstrated complex fluid in the left shoulder joint and surrounding bursa ie bursitis. Given advanced age and borderline creatinine clearance will give one-time dose of IV Toradol to determine response and the decision can be made later to see if patient can tolerate a short course of NSAIDs. Because of advanced age and dementia would avoid steroids since can precipitate psychosis  Junious SilkAllison Myleen Brailsford, ANP

## 2016-02-05 NOTE — H&P (Signed)
[facility] History and Physical                                                                                    Alison Dodson, is a 82 y.o. female  MRN: [id number]   DOB - 07-01-1928  Admit Date - 02/05/2016  Outpatient Primary MD for the patient is Ron Parker, MD  Referring MD: Ellard Artis / ER  PMH: Past Medical History  Diagnosis Date  . Anxiety   . Coronary artery disease   . Depression   . Hypertension   . Tachy-brady syndrome (HCC)   . Permanent atrial fibrillation (HCC)   . HX: anticoagulation   . Glaucoma   . Dizziness   . SSS (sick sinus syndrome) (HCC)   . GERD (gastroesophageal reflux disease)   . Fall   . Osteoarthritis   . Dementia       PSH: Past Surgical History  Procedure Laterality Date  . Insert / replace / remove pacemaker  01/24/2008    DDD PACEMAKER IMPLANT  . Cholecystectomy    . Gastric bypass    . Breast reduction surgery    . US echocardiography  08/10/2007    EF 60%  . US echocardiography  01/12/2007    EF 70%  . Cardiovascular stress test  02/15/2007  . Total knee arthroplasty      right  . Pacemaker insertion    . Permanent pacemaker generator change N/A 01/29/2013    Procedure: PERMANENT PACEMAKER GENERATOR CHANGE;  Surgeon: Hillis Range, MD;  Location: [facility] CATH LAB;  Service: Cardiovascular;  Laterality: N/A;     CC:  Chief Complaint  Patient presents with  . Shoulder Pain     HPI: 82 year old female patient with history of atrial fibrillation not on anticoagulation due to advanced age and fall risk, advanced dementia, hypertension, known CAD, pulmonary hypertension with moderate tricuspid regurgitation, pacemaker in setting of previous tachybradycardia syndrome. Patient was sent to the ER with complaints of left shoulder and chest pain unknown duration. She was given 324 mg of aspirin at the skilled nursing facility. Because of her underlying dementia patient is a poor historian. Patient's daughter was at the bedside and  reports that yesterday while seated in wheelchair the patient was able to utilize the left arm without any pain (she was able to lift the arm to participate in a manicure). Patient does have some mild chronic left shoulder pain but nothing this severe. During my time with the patient was unable to elucidate any reports of chest pain.  ER Evaluation and treatment: Temp 98.3-BP 150/82-pulse 80-respirations 18-room air sats 98% EKG: Atrial fibrillation/flutter with ventricular rate 82 bpm, QTC 432 ms, no ischemic changes of chronic T-wave inversions in lateral leads unchanged from previous EKG XR left Humerus: Stable to degenerative change and left shoulder XR left shoulder: Advanced glenohumeral osteoarthritis 2 view CXR: No edema or consolidation with noted high at a hernia visible Laboratory data: Na 144, K 4.4, BUN 18, creatinine 1.08, calcium 10, troponin 0.01, WBCs 8600 with normal differential, hemoglobin 16.2  Review of Systems   **Unable to obtain accurate history of present illness and review of systems from  patient due to underlying severe advanced dementia; limited history obtained from patient's daughter  Social History Social History  Substance Use Topics  . Smoking status: Former Smoker    Quit date: 07/17/1961  . Smokeless tobacco: Not on file  . Alcohol Use: No    Resides at: Skilled nursing facility  Lives with: N/A  Ambulatory status: Wheelchair   Family History Family History  Problem Relation Age of Onset  . Kidney failure Mother   . Heart attack Father   . Heart attack Sister   . Heart attack Brother      Prior to Admission medications   Medication Sig Start Date End Date Taking? Authorizing Provider  atenolol (TENORMIN) 50 MG tablet Take 50 mg by mouth daily before breakfast.     Historical Provider, MD  azelastine (OPTIVAR) 0.05 % ophthalmic solution Place 1 drop into both eyes 2 (two) times daily.     Historical Provider, MD  buPROPion (WELLBUTRIN XL)  300 MG 24 hr tablet Take 300 mg by mouth daily.    Historical Provider, MD  carboxymethylcellulose (LUBRICANT EYE DROPS) 0.5 % SOLN Place 1 drop into both eyes 2 (two) times daily.    Historical Provider, MD  cholecalciferol (VITAMIN D) 1000 UNITS tablet Take 1,000 Units by mouth daily.    Historical Provider, MD  clopidogrel (PLAVIX) 75 MG tablet Take 1 tablet (75 mg total) by mouth daily with breakfast. 03/08/13   Rhetta MuraJai-Gurmukh Samtani, MD  Cranberry-Vitamin C-Vitamin E (CRANBERRY PLUS VITAMIN C PO) Take 2 capsules by mouth at bedtime.    Historical Provider, MD  docusate sodium (STOOL SOFTENER) 100 MG capsule Take 100 mg by mouth 2 (two) times daily.     Historical Provider, MD  donepezil (ARICEPT) 10 MG tablet Take 10 mg by mouth every evening.     Historical Provider, MD  haloperidol (HALDOL) 0.5 MG tablet Take 0.5 mg by mouth every 6 (six) hours as needed for agitation.    Historical Provider, MD  latanoprost (XALATAN) 0.005 % ophthalmic solution Place 1 drop into both eyes at bedtime.     Historical Provider, MD  lisinopril (PRINIVIL,ZESTRIL) 20 MG tablet Take 20 mg by mouth daily.    Historical Provider, MD  mirtazapine (REMERON) 15 MG tablet Take 15 mg by mouth at bedtime.    Historical Provider, MD  pantoprazole (PROTONIX) 40 MG tablet Take 40 mg by mouth daily.  05/27/13   Historical Provider, MD  QUEtiapine (SEROQUEL) 25 MG tablet Take 25 mg by mouth 2 (two) times daily.    Historical Provider, MD  traMADol (ULTRAM) 50 MG tablet Take 1 tablet (50 mg total) by mouth every 6 (six) hours as needed for pain. 03/04/13   Richardean Canalavid H Yao, MD    Allergies  Allergen Reactions  . Codeine Other (See Comments)    unknown  . Iodine Other (See Comments)    unknown    Physical Exam  Vitals  Blood pressure 150/82, pulse 80, temperature 98 F (36.7 C), temperature source Oral, resp. rate 18, SpO2 98 %.   General:  In no acute distress, appears Stated age  Psych: Pleasant affect but oriented to  name only; and ask questions response with nonsensical answers that are not appropriate to questions I asked; able to follow some commands but essentially must be prompted  Neuro:   No focal neurological deficits, CN II through XII intact, Strength 5/5 all 4 extremities safe for left arm with limited range of motion secondary to severe pain  experienced/reproduced with active and passive range of motion, Sensation intact all 4 extremities.  ENT:  Ears and Eyes appear Normal, Conjunctivae clear, PER. Dry oral mucosa without erythema or exudates.  Neck:  Supple, No lymphadenopathy appreciated  Respiratory:  Symmetrical chest wall movement, Good air movement bilaterally, CTAB. Room Air  Cardiac:  RRR, No Murmurs, no LE edema noted, no JVD, No carotid bruits, peripheral pulses palpable at 2+  Abdomen:  Positive bowel sounds, Soft, Non tender, Non distended,  No masses appreciated, no obvious hepatosplenomegaly  Skin:  No Cyanosis, poor Skin Turgor,  Please note that patient examined and hallway and patient was unable to be undressed to evaluate for bruising  Extremities: Symmetrical without obvious trauma or injury,  no apparent effusions. She with reproducible point tenderness over anterior and superior left shoulder-with passive range of motion patient was reports of severe pain with even limited abduction and abduction  Data Review  CBC  Recent Labs Lab 02/05/16 1345  WBC 8.6  HGB 16.2*  HCT 50.0*  PLT 253  MCV 91.4  MCH 29.6  MCHC 32.4  RDW 14.9  LYMPHSABS 1.4  MONOABS 0.7  EOSABS 0.1  BASOSABS 0.0    Chemistries   Recent Labs Lab 02/05/16 1345  NA 144  K 4.4  CL 107  CO2 28  GLUCOSE 99  BUN 18  CREATININE 1.07*  CALCIUM 10.0    CrCl cannot be calculated (Unknown ideal weight.).  No results for input(s): TSH, T4TOTAL, T3FREE, THYROIDAB in the last 72 hours.  Invalid input(s): FREET3  Coagulation profile No results for input(s): INR, PROTIME in the last 168  hours.  No results for input(s): DDIMER in the last 72 hours.  Cardiac Enzymes No results for input(s): CKMB, TROPONINI, MYOGLOBIN in the last 168 hours.  Invalid input(s): CK  Invalid input(s): POCBNP  Urinalysis    Component Value Date/Time   COLORURINE YELLOW 03/06/2013 2011   APPEARANCEUR CLEAR 03/06/2013 2011   LABSPEC 1.019 03/06/2013 2011   PHURINE 5.0 03/06/2013 2011   GLUCOSEU NEGATIVE 03/06/2013 2011   HGBUR NEGATIVE 03/06/2013 2011   BILIRUBINUR NEGATIVE 03/06/2013 2011   KETONESUR NEGATIVE 03/06/2013 2011   PROTEINUR NEGATIVE 03/06/2013 2011   UROBILINOGEN 4.0* 03/06/2013 2011   NITRITE NEGATIVE 03/06/2013 2011   LEUKOCYTESUR NEGATIVE 03/06/2013 2011    Imaging results:   Dg Chest 2 View  02/05/2016  CLINICAL DATA:  Chest and left shoulder region pain EXAM: CHEST  2 VIEW COMPARISON:  August 23, 2013 FINDINGS: There is no edema or consolidation. Heart size and pulmonary vascularity are normal. There is a focal hiatal hernia. Pacemaker leads are attached to the right atrium and right ventricle. No adenopathy. There is atherosclerotic calcification in the aorta. There is degenerative change in the thoracic spine and each shoulder. There is thoracic dextroscoliosis. IMPRESSION: No edema or consolidation.  Hiatal hernia present. Electronically Signed   By: Bretta Bang III M.D.   On: 02/05/2016 13:24   Dg Shoulder Left  02/05/2016  CLINICAL DATA:  Left shoulder pain of unknown duration. The patient has dementia. Initial encounter. EXAM: LEFT SHOULDER - 2+ VIEW COMPARISON:  Plain films left shoulder 08/23/2013. FINDINGS: No acute bony or joint abnormality is identified. Advanced glenohumeral osteoarthritis is again seen. There is also acromioclavicular degenerative change. Imaged lung parenchyma is clear. No focal bony abnormality seen. IMPRESSION: No acute abnormality. Advanced glenohumeral osteoarthritis. Electronically Signed   By: Drusilla Kanner M.D.   On:  02/05/2016 13:26   Dg Humerus  Left  02/05/2016  CLINICAL DATA:  Left shoulder and mid humerus pain. Dementia. Injury? EXAM: LEFT HUMERUS - 2+ VIEW COMPARISON:  Left shoulder plain film dated 08/23/2013. FINDINGS: Fairly severe degenerative changes again noted at the left glenohumeral joint space, with joint space narrowing and prominent osteophyte formation. Milder degenerative change again noted at the left acromioclavicular joint space. Humerus is stable in alignment. No acute or suspicious osseous lesion. No fracture line or displaced fracture fragment. Soft tissues about the left humerus are unremarkable. IMPRESSION: Stable degenerative change at the left shoulder.  No acute findings. Electronically Signed   By: Bary Richard M.D.   On: 02/05/2016 13:24     EKG: (Independently reviewed)  Atrial fibrillation/flutter with ventricular rate 82 bpm, QTC 432 ms, no ischemic changes of chronic T-wave inversions in lateral leads unchanged from previous EKG   Assessment & Plan  Principal Problem:   Chest pain/known CAD -Given underlying dementia unlikely to get accurate history from patient but as precaution with her underlying apparent CAD we'll cycle enzymes and follow on telemetry -Admit to telemetry/Obs -Echocardiogram -Suspect chest pain is more reflective of referred pain from left shoulder -Continue beta blocker, Plavix and full dose aspirin -EKG unremarkable and reassuring  Active Problems:   Acute pain of left shoulder -Plain x-rays reveal significant glenohumeral osteoarthritis -Daughter reports acute changes and patient's level of pain and since plain films do not explain acute exacerbation of pain we'll obtain CT of left shoulder -Utilize prn Tylenol, Ultram and IV fentanyl -OT evaluation    Essential hypertension, benign -Continue preadmission medications    Atrial fibrillation /pacemaker for tachybradycardia syndrome -None candidate for long-term anticoagulation secondary to  advanced age and fall risk and underlying dementia -Continue beta blocker for rate control -CHADVASc = 4    Volume depletion -Hgb up 2 g from baseline and also has some mild hypercalcemia -Gentle IV fluid with normal saline at 50 mL an hour 24 hours    Alzheimer's dementia -Continue preadmission Wellbutrin, Aricept, Haldol, Remeron and Seroquel    Pulmonary HTN / Moderate tricuspid regurgitation -Non-ambulatory so doubt has any dyspnea on exertion since primarily mobilizes with wheelchair -Repeating echo this admission as above -Last echocardiogram in 2014 with preserved LV dysfunction and normal LV size, moderate TR and mild to moderate pulmonary hypertension with mild dilatation RV moderate dilatation LA and moderate dilatation RA no evidence of aortic stenosis    DVT Prophylaxis: Lovenox  Family Communication:   Daughter at bedside  Code Status:  DO NOT RESUSCITATE-at time of admission daughter and I reviewed paperwork from nursing facility and they had inaccurately listed patient with "CPR"-I recommend sending [place] DO NOT RESUSCITATE form with patient at time of discharge  Condition:  Stable  Discharge disposition: Anticipate discharge but skilled nursing facility within the next 24-48 hours  Time spent in minutes : 60      ELLIS,ALLISON L. ANP on 02/05/2016 at 4:14 PM  You may contact me by going to [email or web address] - password [email or web address]  I am available from 7a-7p but please confirm I am on the schedule by going to Amion as above.   After 7p please contact night coverage person covering me after hours  [facility]

## 2016-02-05 NOTE — ED Notes (Signed)
Attempted report 

## 2016-02-05 NOTE — ED Provider Notes (Signed)
CSN: [id number]     Arrival date & time 02/05/16  1155 History   First MD Initiated Contact with Patient 02/05/16 1200     Chief Complaint  Patient presents with  . Shoulder Pain     (Consider location/radiation/quality/duration/timing/severity/associated sxs/prior Treatment) HPI   LEVEL 5 CAVEAT--DEMENTIA  Alison Dodson is a 82 y.o. female with PMH significant for CAD, HTN, GERD, OA, dementia who presents from SNF with complaints of chest pain and left shoulder pain.  She received 325 mg ASA at the facility.  Currently, when asked she denies any pain at this time.  She denies any fall or trauma.  Denies SOB.   Past Medical History  Diagnosis Date  . Anxiety   . Coronary artery disease   . Depression   . Hypertension   . Tachy-brady syndrome (HCC)   . Permanent atrial fibrillation (HCC)   . HX: anticoagulation   . Glaucoma   . Dizziness   . SSS (sick sinus syndrome) (HCC)   . GERD (gastroesophageal reflux disease)   . Fall   . Osteoarthritis   . Dementia    Past Surgical History  Procedure Laterality Date  . Insert / replace / remove pacemaker  01/24/2008    DDD PACEMAKER IMPLANT  . Cholecystectomy    . Gastric bypass    . Breast reduction surgery    . [place] echocardiography  08/10/2007    EF 60%  . [place] echocardiography  01/12/2007    EF 70%  . Cardiovascular stress test  02/15/2007  . Total knee arthroplasty      right  . Pacemaker insertion    . Permanent pacemaker generator change N/A 01/29/2013    Procedure: PERMANENT PACEMAKER GENERATOR CHANGE;  Surgeon: Hillis RangeJames Allred, MD;  [facility];  Service: Cardiovascular;  Laterality: N/A;   Family History  Problem Relation Age of Onset  . Kidney failure Mother   . Heart attack Father   . Heart attack Sister   . Heart attack Brother    Social History  Substance Use Topics  . Smoking status: Former Smoker    Quit date: 07/17/1961  . Smokeless tobacco: None  . Alcohol Use: No   OB History    No data  available     Review of Systems  Unable to perform ROS: Dementia      Allergies  Codeine and Iodine  Home Medications   Prior to Admission medications   Medication Sig Start Date End Date Taking? Authorizing Provider  atenolol (TENORMIN) 50 MG tablet Take 50 mg by mouth daily before breakfast.     Historical Provider, MD  azelastine (OPTIVAR) 0.05 % ophthalmic solution Place 1 drop into both eyes 2 (two) times daily.     Historical Provider, MD  buPROPion (WELLBUTRIN XL) 300 MG 24 hr tablet Take 300 mg by mouth daily.    Historical Provider, MD  carboxymethylcellulose (LUBRICANT EYE DROPS) 0.5 % SOLN Place 1 drop into both eyes 2 (two) times daily.    Historical Provider, MD  cholecalciferol (VITAMIN D) 1000 UNITS tablet Take 1,000 Units by mouth daily.    Historical Provider, MD  clopidogrel (PLAVIX) 75 MG tablet Take 1 tablet (75 mg total) by mouth daily with breakfast. 03/08/13   Rhetta MuraJai-Gurmukh Samtani, MD  Cranberry-Vitamin C-Vitamin E (CRANBERRY PLUS VITAMIN C PO) Take 2 capsules by mouth at bedtime.    Historical Provider, MD  docusate sodium (STOOL SOFTENER) 100 MG capsule Take 100 mg by mouth 2 (two)  times daily.     Historical Provider, MD  donepezil (ARICEPT) 10 MG tablet Take 10 mg by mouth every evening.     Historical Provider, MD  haloperidol (HALDOL) 0.5 MG tablet Take 0.5 mg by mouth every 6 (six) hours as needed for agitation.    Historical Provider, MD  latanoprost (XALATAN) 0.005 % ophthalmic solution Place 1 drop into both eyes at bedtime.     Historical Provider, MD  lisinopril (PRINIVIL,ZESTRIL) 20 MG tablet Take 20 mg by mouth daily.    Historical Provider, MD  mirtazapine (REMERON) 15 MG tablet Take 15 mg by mouth at bedtime.    Historical Provider, MD  pantoprazole (PROTONIX) 40 MG tablet Take 40 mg by mouth daily.  05/27/13   Historical Provider, MD  QUEtiapine (SEROQUEL) 25 MG tablet Take 25 mg by mouth 2 (two) times daily.    Historical Provider, MD  traMADol  (ULTRAM) 50 MG tablet Take 1 tablet (50 mg total) by mouth every 6 (six) hours as needed for pain. 03/04/13   Richardean Canal, MD   SpO2 95% Physical Exam  Constitutional: She appears well-developed and well-nourished.  Non-toxic appearance. She does not have a sickly appearance. She does not appear ill.  HENT:  Head: Normocephalic and atraumatic.  Mouth/Throat: Oropharynx is clear and moist.  Eyes: Conjunctivae are normal.  Neck: Normal range of motion. Neck supple.  Cardiovascular: Normal rate and regular rhythm.   No murmur heard. Pulses:      Radial pulses are 2+ on the right side, and 2+ on the left side.  Pulmonary/Chest: Effort normal and breath sounds normal. No accessory muscle usage or stridor. No respiratory distress. She has no wheezes. She has no rhonchi. She has no rales. She exhibits no tenderness.  Abdominal: Soft. Bowel sounds are normal. She exhibits no distension. There is no tenderness. There is no rebound and no guarding.  Musculoskeletal:  Clavicle non-tender and without tending. Left shoulder TTP at [facility] joint and humerus.  No obvious deformities, ecchymosis, swelling, or erythema.  Patient refuses to move shoulder for AROM due to pain.  Left elbow non-tender to palpation, normal ROM. 5/5 grip strength bilaterally.   Lymphadenopathy:    She has no cervical adenopathy.  Neurological: She is alert.  Alert to person only.  Speech clear without dysarthria.  Skin: Skin is warm and dry.  No signs of trauma.   Psychiatric: She has a normal mood and affect. Her behavior is normal.    ED Course  Procedures (including critical care time) Labs Review Labs Reviewed  CBC WITH DIFFERENTIAL/PLATELET - Abnormal; Notable for the following:    RBC 5.47 (*)    Hemoglobin 16.2 (*)    HCT 50.0 (*)    All other components within normal limits  BASIC METABOLIC PANEL - Abnormal; Notable for the following:    Creatinine, Ser 1.07 (*)    GFR calc non Af Amer 46 (*)    GFR calc Af Amer  53 (*)    All other components within normal limits  I-STAT TROPOININ, ED    Imaging Review Dg Chest 2 View  02/05/2016  CLINICAL DATA:  Chest and left shoulder region pain EXAM: CHEST  2 VIEW COMPARISON:  August 23, 2013 FINDINGS: There is no edema or consolidation. Heart size and pulmonary vascularity are normal. There is a focal hiatal hernia. Pacemaker leads are attached to the right atrium and right ventricle. No adenopathy. There is atherosclerotic calcification in the aorta. There is degenerative  change in the thoracic spine and each shoulder. There is thoracic dextroscoliosis. IMPRESSION: No edema or consolidation.  Hiatal hernia present. Electronically Signed   By: Bretta Bang III M.D.   On: 02/05/2016 13:24   Dg Shoulder Left  02/05/2016  CLINICAL DATA:  Left shoulder pain of unknown duration. The patient has dementia. Initial encounter. EXAM: LEFT SHOULDER - 2+ VIEW COMPARISON:  Plain films left shoulder 08/23/2013. FINDINGS: No acute bony or joint abnormality is identified. Advanced glenohumeral osteoarthritis is again seen. There is also acromioclavicular degenerative change. Imaged lung parenchyma is clear. No focal bony abnormality seen. IMPRESSION: No acute abnormality. Advanced glenohumeral osteoarthritis. Electronically Signed   By: Drusilla Kanner M.D.   On: 02/05/2016 13:26   Dg Humerus Left  02/05/2016  CLINICAL DATA:  Left shoulder and mid humerus pain. Dementia. Injury? EXAM: LEFT HUMERUS - 2+ VIEW COMPARISON:  Left shoulder plain film dated 08/23/2013. FINDINGS: Fairly severe degenerative changes again noted at the left glenohumeral joint space, with joint space narrowing and prominent osteophyte formation. Milder degenerative change again noted at the left acromioclavicular joint space. Humerus is stable in alignment. No acute or suspicious osseous lesion. No fracture line or displaced fracture fragment. Soft tissues about the left humerus are unremarkable. IMPRESSION:  Stable degenerative change at the left shoulder.  No acute findings. Electronically Signed   By: Bary Richard M.D.   On: 02/05/2016 13:24   I have personally reviewed and evaluated these images and lab results as part of my medical decision-making.   EKG Interpretation   Date/Time:  Friday February 05 2016 12:07:45 EDT Ventricular Rate:  82 PR Interval:    QRS Duration: 84 QT Interval:  370 QTC Calculation: 432 R Axis:   -27 Text Interpretation:  Atrial fibrillation ST \\T \ T wave abnormality,  consider inferior ischemia or digitalis effect ST \\T \ T wave abnormality,  consider anterolateral ischemia or digitalis effect Abnormal ECG Artifact  Nonspecific ST abnormality Confirmed by Manus Gunning  MD, STEPHEN [phone]) on  02/05/2016 12:13:13 PM      MDM   Final diagnoses:  Left shoulder pain  Chest pain, unspecified chest pain type   Patient with dementia presents from SNF with left shoulder pain and chest pain.  On exam, no obvious signs of trauma.  Heart RRR, lungs CTAB, abdomen soft and benign.  Left shoulder and humerus TTP with guarding.  Patient will not move her left shoulder.  Intact radial pulses.  5/5 grip strength.  Will initiate cardiac work up and obtain plain films of left shoulder and humerus.  12:40 PM: Patient seen by Dr. Manus Gunning and daughter present.  Patient is wheelchair bound.  No reports of falls from SNF.  Patient has pacemaker in place for SSS.  She is on plavix. This is patient's baseline orientation, to self only.   Plain films negative for acute abnormalities. EKG shows atrial fibrillation.  Troponin 0.01.  CBC and BMP without acute abnormalities.   Plan to admit to medicine, Dr. Benjamine Mola,  for ACS rule out.  Cheri Fowler, PA-C 02/05/16 1511  Glynn Octave, MD 02/05/16 [id number]

## 2016-02-06 ENCOUNTER — Observation Stay (HOSPITAL_BASED_OUTPATIENT_CLINIC_OR_DEPARTMENT_OTHER): Payer: Medicare Other

## 2016-02-06 DIAGNOSIS — R079 Chest pain, unspecified: Secondary | ICD-10-CM | POA: Diagnosis not present

## 2016-02-06 DIAGNOSIS — M25512 Pain in left shoulder: Secondary | ICD-10-CM | POA: Diagnosis not present

## 2016-02-06 DIAGNOSIS — E869 Volume depletion, unspecified: Secondary | ICD-10-CM

## 2016-02-06 DIAGNOSIS — I25119 Atherosclerotic heart disease of native coronary artery with unspecified angina pectoris: Secondary | ICD-10-CM | POA: Diagnosis not present

## 2016-02-06 DIAGNOSIS — G309 Alzheimer's disease, unspecified: Secondary | ICD-10-CM | POA: Diagnosis not present

## 2016-02-06 LAB — BASIC METABOLIC PANEL
Anion gap: 12 (ref 5–15)
BUN: 10 mg/dL (ref 6–20)
CO2: 26 mmol/L (ref 22–32)
Calcium: 9.3 mg/dL (ref 8.9–10.3)
Chloride: 105 mmol/L (ref 101–111)
Creatinine, Ser: 0.88 mg/dL (ref 0.44–1.00)
GFR calc Af Amer: >60 mL/min (ref >60)
GFR, EST NON AFRICAN AMERICAN: 58 mL/min — AB (ref >60)
GLUCOSE: 78 mg/dL (ref 65–99)
POTASSIUM: 3.6 mmol/L (ref 3.5–5.1)
Sodium: 143 mmol/L (ref 135–145)

## 2016-02-06 LAB — MRSA PCR SCREENING: MRSA by PCR: NEGATIVE

## 2016-02-06 LAB — TROPONIN I
Troponin I: <0.03 ng/mL (ref <0.031)
Troponin I: 0.06 ng/mL — ABNORMAL HIGH (ref <0.031)

## 2016-02-06 LAB — ECHOCARDIOGRAM COMPLETE: Weight: 1781.32 oz

## 2016-02-06 NOTE — Progress Notes (Signed)
[facility] PROGRESS NOTE   Alison Dodson PPJ:[id number] DOB: 11-05-1928 DOA: 02/05/2016 PCP: Ron ParkerBOWEN,SAMUEL, MD  HPI/Subjective:   Assessment/Plan: Principal Problem:   Chest pain Active Problems:   Essential hypertension, benign   Atrial fibrillation (HCC)   Alzheimer's dementia   CAD (coronary artery disease)   Pacemaker   Acute pain of left shoulder   Volume depletion   Pulmonary HTN (HCC)   Moderate tricuspid regurgitation    Chest pain/known CAD -Given underlying dementia unlikely to get accurate history from patient but as precaution with her underlying apparent CAD we'll cycle enzymes and follow on telemetry -Echocardiogram, EKG unremarkable and reassuring -Suspect chest pain is more reflective of referred pain from left shoulder -Continue beta blocker, Plavix and full dose aspirin -Troponins initial 3 sets were normal, minor elevation of 0.06 the fourth troponin, less likely to be ACS.  Acute pain of left shoulder -Plain x-rays reveal significant glenohumeral osteoarthritis -Daughter reports acute changes and patient's level of pain and since plain films do not explain acute exacerbation of pain we'll obtain CT of left shoulder -Utilize prn Tylenol, Ultram and IV fentanyl -I have asked orthopedics to evaluate her.  Essential hypertension, benign -Continue preadmission medications  Atrial fibrillation /pacemaker for tachybradycardia syndrome -None candidate for long-term anticoagulation secondary to advanced age and fall risk and underlying dementia -Continue beta blocker for rate control -CHADVASc = 4  Volume depletion -Hgb up 2 g from baseline and also has some mild hypercalcemia, mild elevation of creatinine as well. -Given IV fluids for 24 hours, check BMP/CBC in a.m.   Alzheimer's dementia -Continue preadmission Wellbutrin, Aricept, Haldol, Remeron and Seroquel   Pulmonary HTN / Moderate tricuspid regurgitation -Non-ambulatory so doubt has  any dyspnea on exertion since primarily mobilizes with wheelchair -Repeating echo this admission as above -Last echocardiogram in 2014 with preserved LV dysfunction and normal LV size, moderate TR and mild to moderate pulmonary hypertension with mild dilatation RV moderate dilatation LA and moderate dilatation RA no evidence of aortic stenosis  Code Status: DNR Family Communication: Plan discussed with the patient. Disposition Plan: Remains inpatient Diet: DIET SOFT Room service appropriate?: Yes; Fluid consistency:: Thin  Consultants:  Orthopedics  Procedures:  None  Antibiotics:  None (indicate start date, and stop date if known)   Objective: Filed Vitals:   02/06/16 0519 02/06/16 0925  BP: 151/89 140/97  Pulse: 66 65  Temp:    Resp: 18     Intake/Output Summary (Last 24 hours) at 02/06/16 1140 Last data filed at 02/06/16 0938  Gross per 24 hour  Intake      3 ml  Output      0 ml  Net      3 ml   Filed Weights   02/05/16 2044  Weight: 50.5 kg (111 lb 5.3 oz)    Exam: General: Alert and awake, oriented x3, not in any acute distress. HEENT: anicteric sclera, pupils reactive to light and accommodation, EOMI CVS: S1-S2 clear, no murmur rubs or gallops Chest: clear to auscultation bilaterally, no wheezing, rales or rhonchi Abdomen: soft nontender, nondistended, normal bowel sounds, no organomegaly Extremities: no cyanosis, clubbing or edema noted bilaterally Neuro: Cranial nerves II-XII intact, no focal neurological deficits  Data Reviewed: Basic Metabolic Panel:  Recent Labs Lab 02/05/16 1345 02/06/16 0646  NA 144 143  K 4.4 3.6  CL 107 105  CO2 28 26  GLUCOSE 99 78  BUN 18 10  CREATININE 1.07* 0.88  CALCIUM 10.0 9.3   Liver  Function Tests: No results for input(s): AST, ALT, ALKPHOS, BILITOT, PROT, ALBUMIN in the last 168 hours. No results for input(s): LIPASE, AMYLASE in the last 168 hours. No results for input(s): AMMONIA in the last 168  hours. CBC:  Recent Labs Lab 02/05/16 1345  WBC 8.6  NEUTROABS 6.5  HGB 16.2*  HCT 50.0*  MCV 91.4  PLT 253   Cardiac Enzymes:  Recent Labs Lab 02/05/16 1810 02/06/16 0004 02/06/16 0646  TROPONINI <0.03 <0.03 0.06*   BNP (last 3 results) No results for input(s): BNP in the last 8760 hours.  ProBNP (last 3 results) No results for input(s): PROBNP in the last 8760 hours.  CBG: No results for input(s): GLUCAP in the last 168 hours.  Micro Recent Results (from the past 240 hour(s))  MRSA PCR Screening     Status: None   Collection Time: 02/05/16 11:01 PM  Result Value Ref Range Status   MRSA by PCR NEGATIVE NEGATIVE Final    Comment:        The GeneXpert MRSA Assay (FDA approved for NASAL specimens only), is one component of a comprehensive MRSA colonization surveillance program. It is not intended to diagnose MRSA infection nor to guide or monitor treatment for MRSA infections.      Studies: Dg Chest 2 View  02/05/2016  CLINICAL DATA:  Chest and left shoulder region pain EXAM: CHEST  2 VIEW COMPARISON:  August 23, 2013 FINDINGS: There is no edema or consolidation. Heart size and pulmonary vascularity are normal. There is a focal hiatal hernia. Pacemaker leads are attached to the right atrium and right ventricle. No adenopathy. There is atherosclerotic calcification in the aorta. There is degenerative change in the thoracic spine and each shoulder. There is thoracic dextroscoliosis. IMPRESSION: No edema or consolidation.  Hiatal hernia present. Electronically Signed   By: Bretta Bang III M.D.   On: 02/05/2016 13:24   Ct Shoulder Left Wo Contrast  02/05/2016  CLINICAL DATA:  Left shoulder pain after recent fall. Dementia. Pacemaker. EXAM: CT OF THE LEFT SHOULDER WITHOUT CONTRAST TECHNIQUE: Multidetector CT imaging was performed according to the standard protocol. Multiplanar CT image reconstructions were also generated. COMPARISON:  Radiograph same date.  FINDINGS: Mild motion artifact, primarily within the chest. No evidence of acute fracture or dislocation. There are severe glenohumeral degenerative changes on the left with joint space loss, osteophytes and subchondral sclerosis. There is mild posterior subluxation of the humeral head relative to the glenoid. There is a moderate to large amount of complex fluid within the shoulder joint and bursa anteriorly and laterally. There is synovial calcification and/or multiple small loose bodies. The rotator cuff musculature appears mildly atrophied diffusely. The subacromial space is mildly narrowed. There is a type 3 acromion with lateral downsloping and mild-to-moderate acromioclavicular degenerative changes. Left subclavian pacemaker noted. The visualized left chest wall and lung demonstrate no acute findings. Emphysematous changes and aortic atherosclerosis noted. IMPRESSION: 1. No evidence of acute fracture or dislocation at the left shoulder. 2. Severe glenohumeral arthritis. 3. Complex fluid in the left shoulder joint and surrounding bursa. Electronically Signed   By: Carey Bullocks M.D.   On: 02/05/2016 17:05   Dg Shoulder Left  02/05/2016  CLINICAL DATA:  Left shoulder pain of unknown duration. The patient has dementia. Initial encounter. EXAM: LEFT SHOULDER - 2+ VIEW COMPARISON:  Plain films left shoulder 08/23/2013. FINDINGS: No acute bony or joint abnormality is identified. Advanced glenohumeral osteoarthritis is again seen. There is also acromioclavicular degenerative change. Imaged  lung parenchyma is clear. No focal bony abnormality seen. IMPRESSION: No acute abnormality. Advanced glenohumeral osteoarthritis. Electronically Signed   By: Drusilla Kanner M.D.   On: 02/05/2016 13:26   Dg Humerus Left  02/05/2016  CLINICAL DATA:  Left shoulder and mid humerus pain. Dementia. Injury? EXAM: LEFT HUMERUS - 2+ VIEW COMPARISON:  Left shoulder plain film dated 08/23/2013. FINDINGS: Fairly severe degenerative  changes again noted at the left glenohumeral joint space, with joint space narrowing and prominent osteophyte formation. Milder degenerative change again noted at the left acromioclavicular joint space. Humerus is stable in alignment. No acute or suspicious osseous lesion. No fracture line or displaced fracture fragment. Soft tissues about the left humerus are unremarkable. IMPRESSION: Stable degenerative change at the left shoulder.  No acute findings. Electronically Signed   By: Bary Richard M.D.   On: 02/05/2016 13:24    Scheduled Meds: . aspirin EC  325 mg Oral Daily  . atenolol  50 mg Oral QAC breakfast  . buPROPion  300 mg Oral Daily  . clopidogrel  75 mg Oral Q breakfast  . donepezil  10 mg Oral QPM  . enoxaparin (LOVENOX) injection  40 mg Subcutaneous Q24H  . lactose free nutrition  237 mL Oral TID WC  . latanoprost  1 drop Both Eyes QHS  . lidocaine  1 patch Transdermal Q24H  . lisinopril  20 mg Oral Daily  . mirtazapine  15 mg Oral QHS  . pantoprazole  40 mg Oral Daily  . polyvinyl alcohol  1 drop Both Eyes BID  . QUEtiapine  25 mg Oral BID  . sodium chloride flush  3 mL Intravenous Q12H   Continuous Infusions: . sodium chloride 50 mL/hr (02/05/16 1717)       Time spent: 35 minutes    [facility]  [facility] Pager [phone] If 7PM-7AM, please contact night-coverage at [email or web address], password [facility] 02/06/2016, 11:40 AM

## 2016-02-06 NOTE — Progress Notes (Signed)
Patient screened for MRSA but result came back negative.

## 2016-02-07 DIAGNOSIS — G309 Alzheimer's disease, unspecified: Secondary | ICD-10-CM

## 2016-02-07 DIAGNOSIS — M25512 Pain in left shoulder: Secondary | ICD-10-CM | POA: Diagnosis not present

## 2016-02-07 DIAGNOSIS — I071 Rheumatic tricuspid insufficiency: Secondary | ICD-10-CM

## 2016-02-07 DIAGNOSIS — F0280 Dementia in other diseases classified elsewhere without behavioral disturbance: Secondary | ICD-10-CM

## 2016-02-07 DIAGNOSIS — Z950 Presence of cardiac pacemaker: Secondary | ICD-10-CM

## 2016-02-07 DIAGNOSIS — I482 Chronic atrial fibrillation, unspecified: Secondary | ICD-10-CM | POA: Diagnosis present

## 2016-02-07 DIAGNOSIS — I10 Essential (primary) hypertension: Secondary | ICD-10-CM

## 2016-02-07 DIAGNOSIS — R079 Chest pain, unspecified: Secondary | ICD-10-CM | POA: Diagnosis not present

## 2016-02-07 DIAGNOSIS — I272 Other secondary pulmonary hypertension: Secondary | ICD-10-CM

## 2016-02-07 LAB — BASIC METABOLIC PANEL
Anion gap: 7 (ref 5–15)
BUN: 18 mg/dL (ref 6–20)
CHLORIDE: 114 mmol/L — AB (ref 101–111)
CO2: 21 mmol/L — AB (ref 22–32)
CREATININE: 0.88 mg/dL (ref 0.44–1.00)
Calcium: 8.9 mg/dL (ref 8.9–10.3)
GFR calc non Af Amer: 58 mL/min — ABNORMAL LOW (ref >60)
Glucose, Bld: 75 mg/dL (ref 65–99)
Potassium: 3.9 mmol/L (ref 3.5–5.1)
Sodium: 142 mmol/L (ref 135–145)

## 2016-02-07 LAB — CBC
HEMATOCRIT: 41.1 % (ref 36.0–46.0)
HEMOGLOBIN: 13.8 g/dL (ref 12.0–15.0)
MCH: 30.5 pg (ref 26.0–34.0)
MCHC: 33.6 g/dL (ref 30.0–36.0)
MCV: 90.9 fL (ref 78.0–100.0)
PLATELETS: 190 10*3/uL (ref 150–400)
RBC: 4.52 MIL/uL (ref 3.87–5.11)
RDW: 14.6 % (ref 11.5–15.5)
WBC: 6 10*3/uL (ref 4.0–10.5)

## 2016-02-07 MED ORDER — QUETIAPINE FUMARATE 25 MG PO TABS: 25.0000 mg | ORAL_TABLET | Freq: Two times a day (BID) | ORAL | Status: DC

## 2016-02-07 MED ORDER — PANTOPRAZOLE SODIUM 40 MG PO TBEC: 40.0000 mg | DELAYED_RELEASE_TABLET | Freq: Every day | ORAL | Status: DC

## 2016-02-07 MED ORDER — LIDOCAINE 5 % EX PTCH: 1.0000 | MEDICATED_PATCH | CUTANEOUS | Status: DC

## 2016-02-07 MED ORDER — ASPIRIN 325 MG PO TBEC: 325.0000 mg | DELAYED_RELEASE_TABLET | Freq: Every day | ORAL | Status: DC

## 2016-02-07 MED ORDER — LISINOPRIL 20 MG PO TABS: 20.0000 mg | ORAL_TABLET | Freq: Every day | ORAL | Status: DC

## 2016-02-07 MED ORDER — TRAMADOL HCL 50 MG PO TABS: 50.0000 mg | ORAL_TABLET | Freq: Four times a day (QID) | ORAL | Status: DC | PRN

## 2016-02-07 MED ORDER — DONEPEZIL HCL 10 MG PO TABS: 10.0000 mg | ORAL_TABLET | Freq: Every evening | ORAL | Status: DC

## 2016-02-07 NOTE — NC FL2 (Deleted)
[place] MEDICAID FL2 LEVEL OF CARE SCREENING TOOL     IDENTIFICATION  Patient Name: Alison Dodson Birthdate: 04-12-1928 Sex: female Admission Date (Current Location): 02/05/2016  [facility] and [place] Number:  Producer, television/film/video and Address:  [facility], [address], [place], [place] [address]      Provider Number: [id number]  Attending Physician Name and Address:  Drema Dallas, MD  Relative Name and Phone Number:       Current Level of Care: Hospital Recommended Level of Care: Assisted Living Facility Prior Approval Number:    Date Approved/Denied:   PASRR Number:    Discharge Plan: Other (Comment) (ALF)    Current Diagnoses: Patient Active Problem List   Diagnosis Date Noted  . Chronic atrial fibrillation (HCC)   . Essential hypertension   . Tricuspid valve regurgitation   . Pain in the chest   . Chest pain 02/05/2016  . Pacemaker 02/05/2016  . Acute pain of left shoulder 02/05/2016  . Volume depletion 02/05/2016  . Pulmonary HTN (HCC) 02/05/2016  . Moderate tricuspid regurgitation 02/05/2016  . CAD (coronary artery disease) 05/19/2013  . TIA (transient ischemic attack) 03/06/2013  . Alzheimer's dementia 07/28/2012  . UTI (lower urinary tract infection) 07/28/2012  . Dizzy 08/02/2011  . Prerenal azotemia 08/02/2011  . Essential hypertension, benign 02/02/2011  . Atrial fibrillation (HCC) 02/02/2011  . BRADYCARDIA-TACHYCARDIA SYNDROME 02/02/2011    Orientation RESPIRATION BLADDER Height & Weight     Self  Normal Incontinent Weight: 50.5 kg (111 lb 5.3 oz) Height:     BEHAVIORAL SYMPTOMS/MOOD NEUROLOGICAL BOWEL NUTRITION STATUS      Incontinent  (Regular)  AMBULATORY STATUS COMMUNICATION OF NEEDS Skin   Limited Assist Verbally Surgical wounds (Incision on leg)                       Personal Care Assistance Level of Assistance  Bathing, Feeding, Dressing Bathing Assistance: Limited assistance Feeding assistance:  Independent Dressing Assistance: Limited assistance     Functional Limitations Info             SPECIAL CARE FACTORS FREQUENCY                       Contractures Contractures Info: Not present    Additional Factors Info  Code Status, Allergies, Psychotropic Code Status Info: DNR Allergies Info: Codeine, Iodine Psychotropic Info: Wellbutrin, Seroquel         Current Medications (02/07/2016):  This is the current hospital active medication list Current Facility-Administered Medications  Medication Dose Route Frequency Provider Last Rate Last Dose  . acetaminophen (TYLENOL) tablet 650 mg  650 mg Oral Q4H PRN Russella Dar, NP      . aspirin EC tablet 325 mg  325 mg Oral Daily Russella Dar, NP   325 mg at 02/07/16 1045  . atenolol (TENORMIN) tablet 50 mg  50 mg Oral QAC breakfast Russella Dar, NP   50 mg at 02/07/16 0820  . buPROPion (WELLBUTRIN XL) 24 hr tablet 300 mg  300 mg Oral Daily Russella Dar, NP   300 mg at 02/07/16 1045  . clopidogrel (PLAVIX) tablet 75 mg  75 mg Oral Q breakfast Russella Dar, NP   75 mg at 02/07/16 0820  . donepezil (ARICEPT) tablet 10 mg  10 mg Oral QPM Russella Dar, NP   10 mg at 02/06/16 1740  . enoxaparin (LOVENOX) injection  40 mg  40 mg Subcutaneous Q24H Russella DarAllison L Ellis, NP   40 mg at 02/06/16 2220  . fentaNYL (SUBLIMAZE) injection 12.5 mcg  12.5 mcg Intravenous Q2H PRN Russella DarAllison L Ellis, NP   12.5 mcg at 02/05/16 1738  . haloperidol (HALDOL) tablet 0.5 mg  0.5 mg Oral Q6H PRN Russella DarAllison L Ellis, NP      . lactose free nutrition (BOOST PLUS) liquid 237 mL  237 mL Oral TID WC Russella DarAllison L Ellis, NP   237 mL at 02/07/16 1224  . latanoprost (XALATAN) 0.005 % ophthalmic solution 1 drop  1 drop Both Eyes QHS Russella DarAllison L Ellis, NP   1 drop at 02/06/16 2220  . lidocaine (LIDODERM) 5 % 1 patch  1 patch Transdermal Q24H Russella DarAllison L Ellis, NP   1 patch at 02/06/16 1742  . lisinopril (PRINIVIL,ZESTRIL) tablet 20 mg  20 mg Oral Daily Russella DarAllison L  Ellis, NP   20 mg at 02/07/16 1044  . mirtazapine (REMERON) tablet 15 mg  15 mg Oral QHS Russella DarAllison L Ellis, NP   15 mg at 02/06/16 2220  . pantoprazole (PROTONIX) EC tablet 40 mg  40 mg Oral Daily Russella DarAllison L Ellis, NP   40 mg at 02/07/16 1045  . polyvinyl alcohol (LIQUIFILM TEARS) 1.4 % ophthalmic solution 1 drop  1 drop Both Eyes BID Russella DarAllison L Ellis, NP   1 drop at 02/07/16 1046  . QUEtiapine (SEROQUEL) tablet 25 mg  25 mg Oral BID Russella DarAllison L Ellis, NP   25 mg at 02/07/16 1044  . sodium chloride flush (NS) 0.9 % injection 3 mL  3 mL Intravenous Q12H Russella DarAllison L Ellis, NP   3 mL at 02/06/16 0938  . traMADol (ULTRAM) tablet 50 mg  50 mg Oral Q6H PRN Russella DarAllison L Ellis, NP       Facility-Administered Medications Ordered in Other Encounters  Medication Dose Route Frequency Provider Last Rate Last Dose  . 0.45 % sodium chloride infusion   Intravenous Continuous Hillis RangeJames Allred, MD      . 0.9 %  sodium chloride infusion  250 mL Intravenous Continuous Hillis RangeJames Allred, MD      . chlorhexidine (HIBICLENS) 4 % liquid 4 application  60 mL Topical Once Hillis RangeJames Allred, MD      . sodium chloride 0.9 % injection 3 mL  3 mL Intravenous Q12H Hillis RangeJames Allred, MD      . sodium chloride 0.9 % injection 3 mL  3 mL Intravenous PRN Hillis RangeJames Allred, MD         Discharge Medications: Please see discharge summary for a list of discharge medications.  Relevant Imaging Results:  Relevant Lab Results:   Additional Information    Mearl LatinNadia S Tawnia Schirm, LCSWA

## 2016-02-07 NOTE — Consult Note (Signed)
Reason for Consult: Left shoulder pain Referring Physician: Dr Gwynneth Macleod is an 82 y.o. female.  HPI: Alison Dodson is an 43 show patient with left shoulder pain. Has a long history of left shoulder pain from arthritis. She denies a history of trauma. Denies any signs or symptoms of infection. She is noted have swelling in the left shoulder. She is currently hospitalized for pacemaker placement which is performed this hospitalization.  Past Medical History  Diagnosis Date  . Anxiety   . Coronary artery disease   . Depression   . Hypertension   . Tachy-brady syndrome ([place])   . Permanent atrial fibrillation ([place])   . HX: anticoagulation   . Glaucoma   . Dizziness   . SSS (sick sinus syndrome) ([place])   . GERD (gastroesophageal reflux disease)   . Fall   . Osteoarthritis   . Dementia     Past Surgical History  Procedure Laterality Date  . Insert / replace / remove pacemaker  01/24/2008    DDD PACEMAKER IMPLANT  . Cholecystectomy    . Gastric bypass    . Breast reduction surgery    . US echocardiography  08/10/2007    EF 60%  . US echocardiography  01/12/2007    EF 70%  . Cardiovascular stress test  02/15/2007  . Total knee arthroplasty      right  . Pacemaker insertion    . Permanent pacemaker generator change N/A 01/29/2013    Procedure: PERMANENT PACEMAKER GENERATOR CHANGE;  Surgeon: Thompson Grayer, MD;  Location: [facility] CATH LAB;  Service: Cardiovascular;  Laterality: N/A;    Family History  Problem Relation Age of Onset  . Kidney failure Mother   . Heart attack Father   . Heart attack Sister   . Heart attack Brother     Social History:  reports that she quit smoking about 54 years ago. She does not have any smokeless tobacco history on file. She reports that she does not drink alcohol or use illicit drugs.  Allergies:  Allergies  Allergen Reactions  . Codeine Other (See Comments)    unknown  . Iodine Other (See Comments)    unknown    Medications: I have  reviewed the patient's current medications.  Results for orders placed or performed during the hospital encounter of 02/05/16 (from the past 48 hour(s))  CBC with Differential     Status: Abnormal   Collection Time: 02/05/16  1:45 PM  Result Value Ref Range   WBC 8.6 4.0 - 10.5 K/uL   RBC 5.47 (H) 3.87 - 5.11 MIL/uL   Hemoglobin 16.2 (H) 12.0 - 15.0 g/dL   HCT 50.0 (H) 36.0 - 46.0 %   MCV 91.4 78.0 - 100.0 fL   MCH 29.6 26.0 - 34.0 pg   MCHC 32.4 30.0 - 36.0 g/dL   RDW 14.9 11.5 - 15.5 %   Platelets 253 150 - 400 K/uL   Neutrophils Relative % 75 %   Neutro Abs 6.5 1.7 - 7.7 K/uL   Lymphocytes Relative 16 %   Lymphs Abs 1.4 0.7 - 4.0 K/uL   Monocytes Relative 8 %   Monocytes Absolute 0.7 0.1 - 1.0 K/uL   Eosinophils Relative 1 %   Eosinophils Absolute 0.1 0.0 - 0.7 K/uL   Basophils Relative 0 %   Basophils Absolute 0.0 0.0 - 0.1 K/uL  Basic metabolic panel     Status: Abnormal   Collection Time: 02/05/16  1:45 PM  Result Value Ref  Range   Sodium 144 135 - 145 mmol/L   Potassium 4.4 3.5 - 5.1 mmol/L   Chloride 107 101 - 111 mmol/L   CO2 28 22 - 32 mmol/L   Glucose, Bld 99 65 - 99 mg/dL   BUN 18 6 - 20 mg/dL   Creatinine, Ser 1.07 (H) 0.44 - 1.00 mg/dL   Calcium 10.0 8.9 - 10.3 mg/dL   GFR calc non Af Amer 46 (L) >60 mL/min   GFR calc Af Amer 53 (L) >60 mL/min    Comment: (NOTE) The eGFR has been calculated using the CKD EPI equation. This calculation has not been validated in all clinical situations. eGFR's persistently <60 mL/min signify possible Chronic Kidney Disease.    Anion gap 9 5 - 15  I-Stat Troponin, ED (not at [facility])     Status: None   Collection Time: 02/05/16  1:52 PM  Result Value Ref Range   Troponin i, poc 0.01 0.00 - 0.08 ng/mL   Comment 3            Comment: Due to the release kinetics of cTnI, a negative result within the first hours of the onset of symptoms does not rule out myocardial infarction with certainty. If myocardial infarction is still  suspected, repeat the test at appropriate intervals.   Troponin I (q 6hr x 3)     Status: None   Collection Time: 02/05/16  6:10 PM  Result Value Ref Range   Troponin I <0.03 <0.031 ng/mL    Comment:        NO INDICATION OF MYOCARDIAL INJURY.   MRSA PCR Screening     Status: None   Collection Time: 02/05/16 11:01 PM  Result Value Ref Range   MRSA by PCR NEGATIVE NEGATIVE    Comment:        The GeneXpert MRSA Assay (FDA approved for NASAL specimens only), is one component of a comprehensive MRSA colonization surveillance program. It is not intended to diagnose MRSA infection nor to guide or monitor treatment for MRSA infections.   Troponin I (q 6hr x 3)     Status: None   Collection Time: 02/06/16 12:04 AM  Result Value Ref Range   Troponin I <0.03 <0.031 ng/mL    Comment:        NO INDICATION OF MYOCARDIAL INJURY.   Basic metabolic panel     Status: Abnormal   Collection Time: 02/06/16  6:46 AM  Result Value Ref Range   Sodium 143 135 - 145 mmol/L   Potassium 3.6 3.5 - 5.1 mmol/L    Comment: DELTA CHECK NOTED   Chloride 105 101 - 111 mmol/L   CO2 26 22 - 32 mmol/L   Glucose, Bld 78 65 - 99 mg/dL   BUN 10 6 - 20 mg/dL   Creatinine, Ser 0.88 0.44 - 1.00 mg/dL   Calcium 9.3 8.9 - 10.3 mg/dL   GFR calc non Af Amer 58 (L) >60 mL/min   GFR calc Af Amer >60 >60 mL/min    Comment: (NOTE) The eGFR has been calculated using the CKD EPI equation. This calculation has not been validated in all clinical situations. eGFR's persistently <60 mL/min signify possible Chronic Kidney Disease.    Anion gap 12 5 - 15  Troponin I (q 6hr x 3)     Status: Abnormal   Collection Time: 02/06/16  6:46 AM  Result Value Ref Range   Troponin I 0.06 (H) <0.031 ng/mL    Comment:  PERSISTENTLY INCREASED TROPONIN VALUES IN THE RANGE OF 0.04-0.49 ng/mL CAN BE SEEN IN:       -UNSTABLE ANGINA       -CONGESTIVE HEART FAILURE       -MYOCARDITIS       -CHEST TRAUMA       -ARRYHTHMIAS        -LATE PRESENTING MYOCARDIAL INFARCTION       -COPD   CLINICAL FOLLOW-UP RECOMMENDED.     Dg Chest 2 View  02/05/2016  CLINICAL DATA:  Chest and left shoulder region pain EXAM: CHEST  2 VIEW COMPARISON:  August 23, 2013 FINDINGS: There is no edema or consolidation. Heart size and pulmonary vascularity are normal. There is a focal hiatal hernia. Pacemaker leads are attached to the right atrium and right ventricle. No adenopathy. There is atherosclerotic calcification in the aorta. There is degenerative change in the thoracic spine and each shoulder. There is thoracic dextroscoliosis. IMPRESSION: No edema or consolidation.  Hiatal hernia present. Electronically Signed   By: Lowella Grip III M.D.   On: 02/05/2016 13:24   Ct Shoulder Left Wo Contrast  02/05/2016  CLINICAL DATA:  Left shoulder pain after recent fall. Dementia. Pacemaker. EXAM: CT OF THE LEFT SHOULDER WITHOUT CONTRAST TECHNIQUE: Multidetector CT imaging was performed according to the standard protocol. Multiplanar CT image reconstructions were also generated. COMPARISON:  Radiograph same date. FINDINGS: Mild motion artifact, primarily within the chest. No evidence of acute fracture or dislocation. There are severe glenohumeral degenerative changes on the left with joint space loss, osteophytes and subchondral sclerosis. There is mild posterior subluxation of the humeral head relative to the glenoid. There is a moderate to large amount of complex fluid within the shoulder joint and bursa anteriorly and laterally. There is synovial calcification and/or multiple small loose bodies. The rotator cuff musculature appears mildly atrophied diffusely. The subacromial space is mildly narrowed. There is a type 3 acromion with lateral downsloping and mild-to-moderate acromioclavicular degenerative changes. Left subclavian pacemaker noted. The visualized left chest wall and lung demonstrate no acute findings. Emphysematous changes and aortic  atherosclerosis noted. IMPRESSION: 1. No evidence of acute fracture or dislocation at the left shoulder. 2. Severe glenohumeral arthritis. 3. Complex fluid in the left shoulder joint and surrounding bursa. Electronically Signed   By: Richardean Sale M.D.   On: 02/05/2016 17:05   Dg Shoulder Left  02/05/2016  CLINICAL DATA:  Left shoulder pain of unknown duration. The patient has dementia. Initial encounter. EXAM: LEFT SHOULDER - 2+ VIEW COMPARISON:  Plain films left shoulder 08/23/2013. FINDINGS: No acute bony or joint abnormality is identified. Advanced glenohumeral osteoarthritis is again seen. There is also acromioclavicular degenerative change. Imaged lung parenchyma is clear. No focal bony abnormality seen. IMPRESSION: No acute abnormality. Advanced glenohumeral osteoarthritis. Electronically Signed   By: Inge Rise M.D.   On: 02/05/2016 13:26   Dg Humerus Left  02/05/2016  CLINICAL DATA:  Left shoulder and mid humerus pain. Dementia. Injury? EXAM: LEFT HUMERUS - 2+ VIEW COMPARISON:  Left shoulder plain film dated 08/23/2013. FINDINGS: Fairly severe degenerative changes again noted at the left glenohumeral joint space, with joint space narrowing and prominent osteophyte formation. Milder degenerative change again noted at the left acromioclavicular joint space. Humerus is stable in alignment. No acute or suspicious osseous lesion. No fracture line or displaced fracture fragment. Soft tissues about the left humerus are unremarkable. IMPRESSION: Stable degenerative change at the left shoulder.  No acute findings. Electronically Signed   By: Cherlynn Kaiser  Alison Dodson M.D.   On: 02/05/2016 13:24    Review of Systems  Constitutional: Negative for fever and chills.  HENT: Negative.   Eyes: Negative.   Respiratory: Negative.   Gastrointestinal: Negative.   Genitourinary: Negative.   Musculoskeletal: Positive for joint pain.  Skin: Negative.   Neurological: Negative.   Endo/Heme/Allergies: Negative.    Psychiatric/Behavioral: Negative.    Blood pressure 150/100, pulse 66, temperature 98.1 F (36.7 C), temperature source Axillary, resp. rate 18, weight 50.5 kg (111 lb 5.3 oz), SpO2 98 %. Physical Exam  Constitutional: She appears well-developed.  HENT:  Head: Normocephalic.  Eyes: Pupils are equal, round, and reactive to light.  Neck: Normal range of motion.  Cardiovascular: Normal rate.   Respiratory: Effort normal.  Neurological: She is alert.  Skin: Skin is warm.  Psychiatric: She has a normal mood and affect.   examination the left shoulder demonstrates generally painless passive range of motion with internal/external rotation she does have mild swelling in the left shoulder but it's not warm. Motor surgery function and is intact radial pulses intact the left-hand side there is no lymphadenopathy. She does have small incision which is well-healed from some type of pacemaker placement in the left chest wall. She does have rotator cuff weakness to manual motor testing motor deltoid fires. Shoulder itself is not warm. Neck range of motion is intact  Assessment/Plan: Impression is left shoulder arthritis with fluid collection present. This doesn't really look like an infection. Shoulder itself is not warm. She does not have a white count. Reasonable  explanation for this would be rotator cuff deficiency-although she doesn't have a high riding humeral head. There is likely she does have shoulder joint effusion which is becoming more prominent. She is on aspirin and Plavix. The shoulder joint could be tapped next week with interventional radiology to completely rule out infection but I think it's highly unlikely clinically at this point. We'll follow.  Irania Durell SCOTT 02/07/2016, 12:09 AM

## 2016-02-07 NOTE — NC FL2 (Deleted)
MEDICAID FL2 LEVEL OF CARE SCREENING TOOL     IDENTIFICATION  Patient Name: Alison Dodson Birthdate: 1928/04/22 Sex: female Admission Date (Current Location): 02/05/2016  [facility] and [place] Number:  Producer, television/film/video and Address:  [facility], [address] [place], [place] [address]      Provider Number: [id number]  Attending Physician Name and Address:  Drema Dallas, MD  Relative Name and Phone Number:       Current Level of Care: Hospital Recommended Level of Care: Assisted Living Facility Prior Approval Number:    Date Approved/Denied:   PASRR Number:    Discharge Plan: Other (Comment) (ALF)    Current Diagnoses: Patient Active Problem List   Diagnosis Date Noted  . Chronic atrial fibrillation (HCC)   . Essential hypertension   . Tricuspid valve regurgitation   . Pain in the chest   . Chest pain 02/05/2016  . Pacemaker 02/05/2016  . Acute pain of left shoulder 02/05/2016  . Volume depletion 02/05/2016  . Pulmonary HTN (HCC) 02/05/2016  . Moderate tricuspid regurgitation 02/05/2016  . CAD (coronary artery disease) 05/19/2013  . TIA (transient ischemic attack) 03/06/2013  . Alzheimer's dementia 07/28/2012  . UTI (lower urinary tract infection) 07/28/2012  . Dizzy 08/02/2011  . Prerenal azotemia 08/02/2011  . Essential hypertension, benign 02/02/2011  . Atrial fibrillation (HCC) 02/02/2011  . BRADYCARDIA-TACHYCARDIA SYNDROME 02/02/2011    Orientation RESPIRATION BLADDER Height & Weight     Self  Normal Incontinent Weight: 50.5 kg (111 lb 5.3 oz) Height:     BEHAVIORAL SYMPTOMS/MOOD NEUROLOGICAL BOWEL NUTRITION STATUS      Incontinent  (Regular)  AMBULATORY STATUS COMMUNICATION OF NEEDS Skin   Limited Assist Verbally Surgical wounds (Incision on leg)                       Personal Care Assistance Level of Assistance  Bathing, Feeding, Dressing Bathing Assistance: Limited assistance Feeding assistance:  Independent Dressing Assistance: Limited assistance     Functional Limitations Info             SPECIAL CARE FACTORS FREQUENCY                       Contractures Contractures Info: Not present    Additional Factors Info  Code Status, Allergies, Psychotropic Code Status Info: DNR Allergies Info: Codeine, Iodine Psychotropic Info: Wellbutrin, Seroquel         Current Medications (02/07/2016):    Discharge Medications: STOP taking these medications       aspirin 81 MG chewable tablet  Replaced by: aspirin 325 MG EC tablet      TAKE these medications       acetaminophen 500 MG tablet  Commonly known as: TYLENOL  Take 500 mg by mouth every 4 (four) hours as needed for mild pain or headache (Not to exceed 4000 mg for fever up to 101.).     aspirin 325 MG EC tablet  Take 1 tablet (325 mg total) by mouth daily.     atenolol 50 MG tablet  Commonly known as: TENORMIN  Take 50 mg by mouth daily before breakfast. Give with 25 mg to = 75 mg daily. Hold if HR <60 or systolic BP <110     azelastine 0.05 % ophthalmic solution  Commonly known as: OPTIVAR  Place 1 drop into both eyes 2 (two) times daily.     buPROPion 300 MG  24 hr tablet  Commonly known as: WELLBUTRIN XL  Take 300 mg by mouth daily.     cholecalciferol 1000 units tablet  Commonly known as: VITAMIN D  Take 1,000 Units by mouth daily.     clopidogrel 75 MG tablet  Commonly known as: PLAVIX  Take 1 tablet (75 mg total) by mouth daily with breakfast.     CRANBERRY PLUS VITAMIN C PO  Take 2 capsules by mouth at bedtime.     donepezil 10 MG tablet  Commonly known as: ARICEPT  Take 1 tablet (10 mg total) by mouth every evening. Reported on 02/05/2016     famotidine 20 MG tablet  Commonly known as: PEPCID  Take 20 mg by mouth at bedtime.     guaifenesin 100 MG/5ML syrup  Commonly known as: ROBITUSSIN  Take 200 mg by mouth  every 6 (six) hours as needed for cough.     haloperidol 0.5 MG tablet  Commonly known as: HALDOL  Take 0.5 mg by mouth every 6 (six) hours as needed for agitation.     hydrocerin Crea  Apply 1 application topically daily.     latanoprost 0.005 % ophthalmic solution  Commonly known as: XALATAN  Place 1 drop into both eyes at bedtime.     lidocaine 5 %  Commonly known as: LIDODERM  Place 1 patch onto the skin daily. Remove & Discard patch within 12 hours or as directed by MD     lisinopril 20 MG tablet  Commonly known as: PRINIVIL,ZESTRIL  Take 1 tablet (20 mg total) by mouth daily. Reported on 02/05/2016     loperamide 2 MG capsule  Commonly known as: IMODIUM  Take 2 mg by mouth as needed for diarrhea or loose stools (Not to exceed 8 doses in 24 hours).     LUBRICANT EYE DROPS 0.5 % Soln  Generic drug: carboxymethylcellulose  Place 1 drop into both eyes 4 (four) times daily.     MINTOX 200-200-20 MG/5ML suspension  Generic drug: alum & mag hydroxide-simeth  Take 30 mLs by mouth every 6 (six) hours as needed for indigestion or heartburn (Not to exceed 4 doses in 24 hours).     mirtazapine 15 MG tablet  Commonly known as: REMERON  Take 15 mg by mouth at bedtime.     nystatin 100000 UNIT/GM Powd  Apply 1 application topically.     pantoprazole 40 MG tablet  Commonly known as: PROTONIX  Take 1 tablet (40 mg total) by mouth daily. Reported on 02/05/2016     QUEtiapine 25 MG tablet  Commonly known as: SEROQUEL  Take 1 tablet (25 mg total) by mouth 2 (two) times daily. Reported on 02/05/2016     RISAMINE 0.44-20.625 % Oint  Generic drug: Menthol-Zinc Oxide  Apply 1 application topically daily as needed (Apply to sacrum and buttocks after each incontinent episode).     traMADol 50 MG tablet  Commonly known as: ULTRAM  Take 1 tablet (50 mg total) by mouth every 6 (six) hours as needed for  moderate pain.     TRIPLE ANTIBIOTIC [phone] Oint  Apply 1 application topically daily as needed (for skin tears, abrasions, or minor irritations).           Relevant Imaging Results:  Relevant Lab Results:   Additional Information    Mearl LatinNadia S Aedan Geimer, LCSWA

## 2016-02-07 NOTE — Progress Notes (Signed)
[facility] Anticipated DC date: 02/07/16 Family notified: Daughter Transport by: PTAR  CSW signing off.  Cristobal GoldmannNadia Indira Sorenson, [place] Clinical Social Worker [phone]

## 2016-02-07 NOTE — NC FL2 (Signed)
[place] MEDICAID FL2 LEVEL OF CARE SCREENING TOOL     IDENTIFICATION  Patient Name: Alison Dodson Birthdate: 04/27/1928 Sex: female Admission Date (Current Location): 02/05/2016  County and Medicaid Number:  [place]   Facility and Address:  [facility], [address], [place], [place] [address]      Provider Number: [id number]  Attending Physician Name and Address:  Curtis J Woods, MD  Relative Name and Phone Number:       Current Level of Care: Hospital Recommended Level of Care: Assisted Living Facility Prior Approval Number:    Date Approved/Denied:   PASRR Number:    Discharge Plan: Other (Comment) (ALF)    Current Diagnoses: Patient Active Problem List   Diagnosis Date Noted  . Chronic atrial fibrillation (HCC)   . Essential hypertension   . Tricuspid valve regurgitation   . Pain in the chest   . Chest pain 02/05/2016  . Pacemaker 02/05/2016  . Acute pain of left shoulder 02/05/2016  . Volume depletion 02/05/2016  . Pulmonary HTN (HCC) 02/05/2016  . Moderate tricuspid regurgitation 02/05/2016  . CAD (coronary artery disease) 05/19/2013  . TIA (transient ischemic attack) 03/06/2013  . Alzheimer's dementia 07/28/2012  . UTI (lower urinary tract infection) 07/28/2012  . Dizzy 08/02/2011  . Prerenal azotemia 08/02/2011  . Essential hypertension, benign 02/02/2011  . Atrial fibrillation (HCC) 02/02/2011  . BRADYCARDIA-TACHYCARDIA SYNDROME 02/02/2011    Orientation RESPIRATION BLADDER Height & Weight     Self  Normal Incontinent Weight: 50.5 kg (111 lb 5.3 oz) Height:     BEHAVIORAL SYMPTOMS/MOOD NEUROLOGICAL BOWEL NUTRITION STATUS      Incontinent  (Regular)  AMBULATORY STATUS COMMUNICATION OF NEEDS Skin   Limited Assist Verbally Surgical wounds (Incision on leg)                       Personal Care Assistance Level of Assistance  Bathing, Feeding, Dressing Bathing Assistance: Limited assistance Feeding assistance:  Independent Dressing Assistance: Limited assistance     Functional Limitations Info             SPECIAL CARE FACTORS FREQUENCY                       Contractures Contractures Info: Not present    Additional Factors Info  Code Status, Allergies, Psychotropic Code Status Info: DNR Allergies Info: Codeine, Iodine Psychotropic Info: Wellbutrin, Seroquel         Current Medications (02/07/2016):    Discharge Medications: STOP taking these medications       aspirin 81 MG chewable tablet  Replaced by: aspirin 325 MG EC tablet      TAKE these medications       acetaminophen 500 MG tablet  Commonly known as: TYLENOL  Take 500 mg by mouth every 4 (four) hours as needed for mild pain or headache (Not to exceed 4000 mg for fever up to 101.).     aspirin 325 MG EC tablet  Take 1 tablet (325 mg total) by mouth daily.     atenolol 50 MG tablet  Commonly known as: TENORMIN  Take 50 mg by mouth daily before breakfast. Give with 25 mg to = 75 mg daily. Hold if HR <60 or systolic BP <110     azelastine 0.05 % ophthalmic solution  Commonly known as: OPTIVAR  Place 1 drop into both eyes 2 (two) times daily.     buPROPion 300 MG   24 hr tablet  Commonly known as: WELLBUTRIN XL  Take 300 mg by mouth daily.     cholecalciferol 1000 units tablet  Commonly known as: VITAMIN D  Take 1,000 Units by mouth daily.     clopidogrel 75 MG tablet  Commonly known as: PLAVIX  Take 1 tablet (75 mg total) by mouth daily with breakfast.     CRANBERRY PLUS VITAMIN C PO  Take 2 capsules by mouth at bedtime.     donepezil 10 MG tablet  Commonly known as: ARICEPT  Take 1 tablet (10 mg total) by mouth every evening. Reported on 02/05/2016     famotidine 20 MG tablet  Commonly known as: PEPCID  Take 20 mg by mouth at bedtime.     guaifenesin 100 MG/5ML syrup  Commonly known as: ROBITUSSIN  Take 200 mg by mouth  every 6 (six) hours as needed for cough.     haloperidol 0.5 MG tablet  Commonly known as: HALDOL  Take 0.5 mg by mouth every 6 (six) hours as needed for agitation.     hydrocerin Crea  Apply 1 application topically daily.     latanoprost 0.005 % ophthalmic solution  Commonly known as: XALATAN  Place 1 drop into both eyes at bedtime.     lidocaine 5 %  Commonly known as: LIDODERM  Place 1 patch onto the skin daily. Remove & Discard patch within 12 hours or as directed by MD     lisinopril 20 MG tablet  Commonly known as: PRINIVIL,ZESTRIL  Take 1 tablet (20 mg total) by mouth daily. Reported on 02/05/2016     loperamide 2 MG capsule  Commonly known as: IMODIUM  Take 2 mg by mouth as needed for diarrhea or loose stools (Not to exceed 8 doses in 24 hours).     LUBRICANT EYE DROPS 0.5 % Soln  Generic drug: carboxymethylcellulose  Place 1 drop into both eyes 4 (four) times daily.     MINTOX 200-200-20 MG/5ML suspension  Generic drug: alum & mag hydroxide-simeth  Take 30 mLs by mouth every 6 (six) hours as needed for indigestion or heartburn (Not to exceed 4 doses in 24 hours).     mirtazapine 15 MG tablet  Commonly known as: REMERON  Take 15 mg by mouth at bedtime.     nystatin 100000 UNIT/GM Powd  Apply 1 application topically.     pantoprazole 40 MG tablet  Commonly known as: PROTONIX  Take 1 tablet (40 mg total) by mouth daily. Reported on 02/05/2016     QUEtiapine 25 MG tablet  Commonly known as: SEROQUEL  Take 1 tablet (25 mg total) by mouth 2 (two) times daily. Reported on 02/05/2016     RISAMINE 0.44-20.625 % Oint  Generic drug: Menthol-Zinc Oxide  Apply 1 application topically daily as needed (Apply to sacrum and buttocks after each incontinent episode).     traMADol 50 MG tablet  Commonly known as: ULTRAM  Take 1 tablet (50 mg total) by mouth every 6 (six) hours as needed for  moderate pain.     TRIPLE ANTIBIOTIC 3.5-400-5000 Oint  Apply 1 application topically daily as needed (for skin tears, abrasions, or minor irritations).           Relevant Imaging Results:  Relevant Lab Results:   Additional Information    Ananias Kolander S Dann Ventress, LCSWA     

## 2016-02-07 NOTE — Care Management Note (Addendum)
Case Management Note  Patient Details  Name: Alison Dodson MRN: [id number] Date of Birth: 1928/05/05  Subjective/Objective:                  Chest pain  Action/Plan: CM spoke with patient and her daughter at the bedside. CM [facility] about [facility]. Per Alvira PhilipsGeraldine, CM can fax the PT/OT orders and she will provide the orders to the in-house PT/OT provider. Faxed to [phone].  PTAR called for transportation at 2:27 pm.   Expected Discharge Date:  02/08/16               Expected Discharge Plan:  Assisted Living / Rest Home  In-House Referral:     Discharge planning Services  CM Consult  Post Acute Care Choice:    Choice offered to:     DME Arranged:  N/A DME Agency:  NA  HH Arranged:  PT, OT HH Agency:     Status of Service:  Completed, signed off  Medicare Important Message Given:    Date Medicare IM Given:    Medicare IM give by:    Date Additional Medicare IM Given:    Additional Medicare Important Message give by:     If discussed at Long Length of Stay Meetings, dates discussed:    Additional Comments:  Antony HasteBennett, Laveta Gilkey Harris, RN 02/07/2016, 2:13 PM

## 2016-02-07 NOTE — Discharge Summary (Signed)
Physician Discharge Summary  Alison Dodson ZOX:[id number] DOB: July 12, 1928 DOA: 02/05/2016  PCP: Ron Parker, MD  Admit date: 02/05/2016 Discharge date: 02/07/2016  Time spent: 40 minutes  Recommendations for Outpatient Follow-up: Chest pain/known CAD -Echocardiogram, EKG unremarkable and reassuring -CPAP most likely referred pain from left shoulder, resolved -Atenolol 50 mg daily  -Lisinopril 20 mg daily -Plavix 75 mg daily -Aspirin 325 mg daily  -Troponins initial 3 sets were normal, minor elevation of 0.06 the fourth troponin, less likely to be ACS.  Acute pain of left shoulder -Plain x-rays/CT scan reveals severe glenohumeral osteoarthritis with moderate fluid. -Patient asymptomatic, has decent ROM, negative leukocytosis, afebrile. Alison Dodson states that since patient is at baseline and asymptomatic would not go through with aspiration of joint/injection. Counseled Alison that this is reasonable approach and that if in the future mother has increasing pain urgent care or orthopedic outpatient appointment complicated inject joint.  -PT for strengthening and ROM -Utilize prn Tylenol, Ultram  Essential hypertension, benign -See chest pain   Atrial fibrillation /pacemaker for tachybradycardia syndrome -Not candidate for long-term anticoagulation secondary to advanced age and fall risk and underlying dementia -Currently rate controlled on current medication  -CHADVASc = 4  Volume depletion -Hgb up 2 g from baseline and also has some mild hypercalcemia, mild elevation of creatinine as well. -Given IV fluids for 24 hours, check BMP/CBC in a.m.  Alzheimer's dementia -Wellbutrin 300 mg daily -Aricept 10 mg daily -Haldol 0.5 mg QID PRN  -Remeron 15 mg daily -Seroquel 25 mg BID   Pulmonary HTN / Moderate tricuspid regurgitation -Non-ambulatory so doubt has any dyspnea on exertion since primarily mobilizes with wheelchair -Repeating echo this admission as  above -Last echocardiogram in 2014 with preserved LV dysfunction and normal LV size, moderate TR and mild to moderate pulmonary hypertension with mild dilatation RV moderate dilatation LA and moderate dilatation RA no evidence of aortic stenosis. No significant change on new echocardiogram see results below    Discharge Diagnoses:  Principal Problem:   Chest pain Active Problems:   Essential hypertension, benign   Atrial fibrillation (HCC)   Alzheimer's dementia   CAD (coronary artery disease)   Pacemaker   Acute pain of left shoulder   Volume depletion   Pulmonary HTN (HCC)   Moderate tricuspid regurgitation   Chronic atrial fibrillation (HCC)   Essential hypertension   Tricuspid valve regurgitation   Pain in the chest   Discharge Condition: Stable  Diet recommendation: Regular  Filed Weights   02/05/16 2044  Weight: 50.5 kg (111 lb 5.3 oz)    History of present illness:  82 year old WF PMHx Atrial fibrillation not on anticoagulation due to advanced age and fall risk, Advanced Dementia, HTN, CAD native artery, Pulmonary HTN, Moderate Tricuspid Regurgitation, Pacemaker in setting of previous TachyBradycardia syndrome.  Patient was sent to the ER with complaints of left shoulder and chest pain unknown duration. She was given 324 mg of aspirin at the skilled nursing facility. Because of her underlying dementia patient is a poor historian. Patient's Alison was at the bedside and reports that yesterday while seated in wheelchair the patient was able to utilize the left arm without any pain (she was able to lift the arm to participate in a manicure). Patient does have some mild chronic left shoulder pain but nothing this severe. During my time with the patient was unable to elucidate any reports of chest pain. During his hospitalization patient was evaluated for ACS and ruled out. In addition patient's left shoulder  was evaluated which showed severe degenerative changes in the [facility]  joint and glenohumeral. Patient currently asymptomatic on oral medication. At baseline per Alison.   Procedures: 3/17 x-ray left shoulder;-Advanced glenohumeral osteoarthritis -acromioclavicular degenerative change 3/17 CT left shoulder W Wo contrast;-severe glenohumeral degenerative changes on the left with joint space loss, osteophytes and subchondral sclerosis.  -moderate to large amount complex fluid within shoulder joint and bursa anteriorly and laterally 3/18 echocardiogram;Left ventricle: mild LVH. - LVEF= 55% to 60%.- Indeterminant diastolic function (atrial fibrillation). -- Left atrium: severely dilated.- Right atrium: severely dilated. - Tricuspid valve: There was moderate-severe regurgitation. Peak  RV-RA gradient (S): 40 mm Hg. - Pulmonary arteries: PA peak pressure: 43 mm Hg (S).   Consultations: Dr.Scott Rise [facility] orthopedics   Antibiotics NA   Discharge Exam: Filed Vitals:   02/07/16 0459 02/07/16 0511 02/07/16 0817 02/07/16 1044  BP: 162/91 162/91 157/112 140/81  Pulse: 74 72 64   Temp: 97.5 F (36.4 C) 97.5 F (36.4 C) 97.6 F (36.4 C)   TempSrc: Oral Oral Oral   Resp: Weight:      SpO2: 100% 99% 100%     General: A/O 1 (does not know where, when, why) very pleasantly demented, No acute respiratory distress Eyes: Negative headache, double vision,negative scleral hemorrhage ENT: Negative Runny nose, negative gingival bleeding, Neck: Negative scars, masses, torticollis, lymphadenopathy, JVD Lungs: Clear to auscultation bilaterally without wheezes or crackles Cardiovascular: Irregular irregular rhythm and rate, wthout murmur gallop or rub normal S1 and S2, pacer present left upper chest wall. Abdomen:negative abdominal pain, negative dysphagia, nondistended, positive soft, bowel sounds, no rebound, no ascites, no appreciable mass Extremities: No significant cyanosis, clubbing, or edema bilateral lower extremities. Able to grip and radiates  of overhead bilateral upper extremity, Rt > Lt side. Negative pain with abduction, extension    Discharge Instructions     Medication List    STOP taking these medications        aspirin 81 MG chewable tablet  Replaced by:  aspirin 325 MG EC tablet      TAKE these medications        acetaminophen 500 MG tablet  Commonly known as:  TYLENOL  Take 500 mg by mouth every 4 (four) hours as needed for mild pain or headache (Not to exceed 4000 mg for fever up to 101.).     aspirin 325 MG EC tablet  Take 1 tablet (325 mg total) by mouth daily.     atenolol 50 MG tablet  Commonly known as:  TENORMIN  Take 50 mg by mouth daily before breakfast. Give with 25 mg to = 75 mg daily.  Hold if HR <60 or systolic BP <110     azelastine 0.05 % ophthalmic solution  Commonly known as:  OPTIVAR  Place 1 drop into both eyes 2 (two) times daily.     buPROPion 300 MG 24 hr tablet  Commonly known as:  WELLBUTRIN XL  Take 300 mg by mouth daily.     cholecalciferol 1000 units tablet  Commonly known as:  VITAMIN D  Take 1,000 Units by mouth daily.     clopidogrel 75 MG tablet  Commonly known as:  PLAVIX  Take 1 tablet (75 mg total) by mouth daily with breakfast.     CRANBERRY PLUS VITAMIN C PO  Take 2 capsules by mouth at bedtime.     donepezil 10 MG tablet  Commonly known as:  ARICEPT  Take 1 tablet (  10 mg total) by mouth every evening. Reported on 02/05/2016     famotidine 20 MG tablet  Commonly known as:  PEPCID  Take 20 mg by mouth at bedtime.     guaifenesin 100 MG/5ML syrup  Commonly known as:  ROBITUSSIN  Take 200 mg by mouth every 6 (six) hours as needed for cough.     haloperidol 0.5 MG tablet  Commonly known as:  HALDOL  Take 0.5 mg by mouth every 6 (six) hours as needed for agitation.     hydrocerin Crea  Apply 1 application topically daily.     latanoprost 0.005 % ophthalmic solution  Commonly known as:  XALATAN  Place 1 drop into both eyes at bedtime.      lidocaine 5 %  Commonly known as:  LIDODERM  Place 1 patch onto the skin daily. Remove & Discard patch within 12 hours or as directed by MD     lisinopril 20 MG tablet  Commonly known as:  PRINIVIL,ZESTRIL  Take 1 tablet (20 mg total) by mouth daily. Reported on 02/05/2016     loperamide 2 MG capsule  Commonly known as:  IMODIUM  Take 2 mg by mouth as needed for diarrhea or loose stools (Not to exceed 8 doses in 24 hours).     LUBRICANT EYE DROPS 0.5 % Soln  Generic drug:  carboxymethylcellulose  Place 1 drop into both eyes 4 (four) times daily.     MINTOX 200-200-20 MG/5ML suspension  Generic drug:  alum & mag hydroxide-simeth  Take 30 mLs by mouth every 6 (six) hours as needed for indigestion or heartburn (Not to exceed 4 doses in 24 hours).     mirtazapine 15 MG tablet  Commonly known as:  REMERON  Take 15 mg by mouth at bedtime.     nystatin 100000 UNIT/GM Powd  Apply 1 application topically.     pantoprazole 40 MG tablet  Commonly known as:  PROTONIX  Take 1 tablet (40 mg total) by mouth daily. Reported on 02/05/2016     QUEtiapine 25 MG tablet  Commonly known as:  SEROQUEL  Take 1 tablet (25 mg total) by mouth 2 (two) times daily. Reported on 02/05/2016     RISAMINE 0.44-20.625 % Oint  Generic drug:  Menthol-Zinc Oxide  Apply 1 application topically daily as needed (Apply to sacrum and buttocks after each incontinent episode).     traMADol 50 MG tablet  Commonly known as:  ULTRAM  Take 1 tablet (50 mg total) by mouth every 6 (six) hours as needed for moderate pain.     TRIPLE ANTIBIOTIC [phone] Oint  Apply 1 application topically daily as needed (for skin tears, abrasions, or minor irritations).       Allergies  Allergen Reactions  . Codeine Other (See Comments)    unknown  . Iodine Other (See Comments)    unknown      The results of significant diagnostics from this hospitalization (including imaging, microbiology, ancillary and laboratory) are  listed below for reference.    Significant Diagnostic Studies: Dg Chest 2 View  02/05/2016  CLINICAL DATA:  Chest and left shoulder region pain EXAM: CHEST  2 VIEW COMPARISON:  August 23, 2013 FINDINGS: There is no edema or consolidation. Heart size and pulmonary vascularity are normal. There is a focal hiatal hernia. Pacemaker leads are attached to the right atrium and right ventricle. No adenopathy. There is atherosclerotic calcification in the aorta. There is degenerative change in the thoracic spine and  each shoulder. There is thoracic dextroscoliosis. IMPRESSION: No edema or consolidation.  Hiatal hernia present. Electronically Signed   By: Bretta Bang III M.D.   On: 02/05/2016 13:24   Ct Shoulder Left Wo Contrast  02/05/2016  CLINICAL DATA:  Left shoulder pain after recent fall. Dementia. Pacemaker. EXAM: CT OF THE LEFT SHOULDER WITHOUT CONTRAST TECHNIQUE: Multidetector CT imaging was performed according to the standard protocol. Multiplanar CT image reconstructions were also generated. COMPARISON:  Radiograph same date. FINDINGS: Mild motion artifact, primarily within the chest. No evidence of acute fracture or dislocation. There are severe glenohumeral degenerative changes on the left with joint space loss, osteophytes and subchondral sclerosis. There is mild posterior subluxation of the humeral head relative to the glenoid. There is a moderate to large amount of complex fluid within the shoulder joint and bursa anteriorly and laterally. There is synovial calcification and/or multiple small loose bodies. The rotator cuff musculature appears mildly atrophied diffusely. The subacromial space is mildly narrowed. There is a type 3 acromion with lateral downsloping and mild-to-moderate acromioclavicular degenerative changes. Left subclavian pacemaker noted. The visualized left chest wall and lung demonstrate no acute findings. Emphysematous changes and aortic atherosclerosis noted. IMPRESSION: 1. No  evidence of acute fracture or dislocation at the left shoulder. 2. Severe glenohumeral arthritis. 3. Complex fluid in the left shoulder joint and surrounding bursa. Electronically Signed   By: Carey Bullocks M.D.   On: 02/05/2016 17:05   Dg Shoulder Left  02/05/2016  CLINICAL DATA:  Left shoulder pain of unknown duration. The patient has dementia. Initial encounter. EXAM: LEFT SHOULDER - 2+ VIEW COMPARISON:  Plain films left shoulder 08/23/2013. FINDINGS: No acute bony or joint abnormality is identified. Advanced glenohumeral osteoarthritis is again seen. There is also acromioclavicular degenerative change. Imaged lung parenchyma is clear. No focal bony abnormality seen. IMPRESSION: No acute abnormality. Advanced glenohumeral osteoarthritis. Electronically Signed   By: Drusilla Kanner M.D.   On: 02/05/2016 13:26   Dg Humerus Left  02/05/2016  CLINICAL DATA:  Left shoulder and mid humerus pain. Dementia. Injury? EXAM: LEFT HUMERUS - 2+ VIEW COMPARISON:  Left shoulder plain film dated 08/23/2013. FINDINGS: Fairly severe degenerative changes again noted at the left glenohumeral joint space, with joint space narrowing and prominent osteophyte formation. Milder degenerative change again noted at the left acromioclavicular joint space. Humerus is stable in alignment. No acute or suspicious osseous lesion. No fracture line or displaced fracture fragment. Soft tissues about the left humerus are unremarkable. IMPRESSION: Stable degenerative change at the left shoulder.  No acute findings. Electronically Signed   By: Bary Richard M.D.   On: 02/05/2016 13:24    Microbiology: Recent Results (from the past 240 hour(s))  MRSA PCR Screening     Status: None   Collection Time: 02/05/16 11:01 PM  Result Value Ref Range Status   MRSA by PCR NEGATIVE NEGATIVE Final    Comment:        The GeneXpert MRSA Assay (FDA approved for NASAL specimens only), is one component of a comprehensive MRSA  colonization surveillance program. It is not intended to diagnose MRSA infection nor to guide or monitor treatment for MRSA infections.      Labs: Basic Metabolic Panel:  Recent Labs Lab 02/05/16 1345 02/06/16 0646 02/07/16 0310  NA 144 143 142  K 4.4 3.6 3.9  CL 107 105 114*  CO2 28 26 21*  GLUCOSE 99 78 75  BUN 18 10 18   CREATININE 1.07* 0.88 0.88  CALCIUM 10.0  9.3 8.9   Liver Function Tests: No results for input(s): AST, ALT, ALKPHOS, BILITOT, PROT, ALBUMIN in the last 168 hours. No results for input(s): LIPASE, AMYLASE in the last 168 hours. No results for input(s): AMMONIA in the last 168 hours. CBC:  Recent Labs Lab 02/05/16 1345 02/07/16 0310  WBC 8.6 6.0  NEUTROABS 6.5  --   HGB 16.2* 13.8  HCT 50.0* 41.1  MCV 91.4 90.9  PLT 253 190   Cardiac Enzymes:  Recent Labs Lab 02/05/16 1810 02/06/16 0004 02/06/16 0646  TROPONINI <0.03 <0.03 0.06*   BNP: BNP (last 3 results) No results for input(s): BNP in the last 8760 hours.  ProBNP (last 3 results) No results for input(s): PROBNP in the last 8760 hours.  CBG: No results for input(s): GLUCAP in the last 168 hours.     Signed:  Carolyne Littles, MD [facility] [phone] pager

## 2016-02-07 NOTE — Progress Notes (Signed)
Pt D/C to [facility]  Via EMT per MD order, D/C instructions reviewed with pt's daughter,  She is aware of follow up appt.,  Paper prescription for tramadol given to the EMT  Person, IV removed and site looks clean and intact, Report called to the RN [facility] Sheldon[facility] and all questions answered.

## 2016-02-07 NOTE — Clinical Social Work Note (Signed)
Clinical Social Work Assessment  Patient Details  Name: Alison Dodson MRN: [id number] Date of Birth: July 27, 1928  Date of referral:  02/07/16               Reason for consult:  Facility Placement                Permission sought to share information with:  Facility Medical sales representativeContact Representative, Family Supports Permission granted to share information::  Yes, Verbal Permission Granted  Name::     [name]  Agency::  [facility]  Relationship::     Contact Information:  [phone]  Housing/Transportation Living arrangements for the past 2 months:  Assisted Living Facility Source of Information:  Adult Children Patient Interpreter Needed:  None Criminal Activity/Legal Involvement Pertinent to Current Situation/Hospitalization:  No - Comment as needed Significant Relationships:  Adult Children Lives with:  Self Do you feel safe going back to the place where you live?  Yes Need for family participation in patient care:  Yes (Comment)  Care giving concerns:  [facility] she [facility] a resident.   Social Worker assessment / plan:  CSW spoke with patient's daughter since patient is disoriented. Patient will return to ALF by PTAR.  Employment status:  Retired Health and safety inspectornsurance information:  Medicare PT Recommendations:  No Follow Up Information / Referral to community resources:     Patient/Family's Response to care:  Patient's daughter is in agreement with plan to return patient to [facility].  Patient/Family's Understanding of and Emotional Response to Diagnosis, Current Treatment, and Prognosis:  No questions/concerns.  Emotional Assessment Appearance:  Appears stated age Attitude/Demeanor/Rapport:   (Appropriate) Affect (typically observed):  Accepting Orientation:  Oriented to Self Alcohol / Substance use:  Not Applicable Psych involvement (Current and /or in the community):  No (Comment)  Discharge Needs  Concerns to be addressed:  Care  Coordination Readmission within the last 30 days:  No Current discharge risk:  None Barriers to Discharge:  No Barriers Identified   Mearl Latinadia S Arbell Wycoff, LCSWA 02/07/2016, 11:34 AM

## 2016-02-07 NOTE — Clinical Social Work Note (Deleted)
Clinical Social Work Assessment  Patient Details  Name: Alison Dodson MRN: [id number] Date of Birth: [birth year]  Date of referral:  02/07/16               Reason for consult:  Facility Placement                Permission sought to share information with:  Facility Medical sales representativeContact Representative, Family Supports Permission granted to share information::  Yes, Verbal Permission Granted  Name::     Alison Dodson  Agency[facility]  Relationship::     Contact Information:  [phone]  Housing/Transportation Living arrangements for the past 2 months:  Assisted Living Facility Source of Information:  Adult Children Patient Interpreter Needed:  None Criminal Activity/Legal Involvement Pertinent to Current Situation/Hospitalization:  No - Comment as needed Significant Relationships:  Adult Children Lives with:  Self Do you feel safe going back to the place where you live?  Yes Need for family participation in patient care:  Yes (Comment)  Care giving concerns:  CSW spoke with patient's daughter who is in agreement with [facility]   Social Worker assessment / plan:  Patient will return to ALF.  Employment status:  Retired Health and safety inspectornsurance information:  Medicare PT Recommendations:  No Follow Up Information / Referral to community resources:     Patient/Family's Response to care:  Patient's daughter states that her mom has lived at [facility] for a while and would like to return.  Patient/Family's Understanding of and Emotional Response to Diagnosis, Current Treatment, and Prognosis:  No questions.  Emotional Assessment Appearance:  Appears stated age Attitude/Demeanor/Rapport:   (Appropriate) Affect (typically observed):  Accepting Orientation:  Oriented to Self Alcohol / Substance use:  Not Applicable Psych involvement (Current and /or in the community):  No (Comment)  Discharge Needs  Concerns to be addressed:  Care Coordination Readmission within the  last 30 days:  No Current discharge risk:  None Barriers to Discharge:  No Barriers Identified   Alison Dodson, LCSWA 02/07/2016, 1:11 PM

## 2016-03-07 ENCOUNTER — Encounter: Payer: Self-pay | Admitting: Internal Medicine

## 2016-03-07 ENCOUNTER — Ambulatory Visit (INDEPENDENT_AMBULATORY_CARE_PROVIDER_SITE_OTHER): Payer: Medicare Other | Admitting: Internal Medicine

## 2016-03-07 VITALS — BP 132/90 | HR 52

## 2016-03-07 DIAGNOSIS — I495 Sick sinus syndrome: Secondary | ICD-10-CM

## 2016-03-07 DIAGNOSIS — I482 Chronic atrial fibrillation, unspecified: Secondary | ICD-10-CM

## 2016-03-07 DIAGNOSIS — I10 Essential (primary) hypertension: Secondary | ICD-10-CM

## 2016-03-07 LAB — CUP PACEART INCLINIC DEVICE CHECK
Battery Impedance: 423 Ohm
Battery Remaining Longevity: 92 mo
Battery Voltage: 2.78 V
Implantable Lead Location: 753860
Implantable Lead Model: 5076
Lead Channel Impedance Value: 0 Ohm
Lead Channel Pacing Threshold Amplitude: 1 V
Lead Channel Pacing Threshold Pulse Width: 0.4 ms
Lead Channel Setting Pacing Amplitude: 2.5 V
MDC IDC LEAD IMPLANT DT: 20090305
MDC IDC MSMT LEADCHNL RV IMPEDANCE VALUE: 445 Ohm
MDC IDC MSMT LEADCHNL RV PACING THRESHOLD AMPLITUDE: 1 V
MDC IDC MSMT LEADCHNL RV PACING THRESHOLD PULSEWIDTH: 0.4 ms
MDC IDC MSMT LEADCHNL RV SENSING INTR AMPL: 8 mV
MDC IDC SESS DTM: 20170417145411
MDC IDC SET LEADCHNL RV PACING PULSEWIDTH: 0.4 ms
MDC IDC SET LEADCHNL RV SENSING SENSITIVITY: 2.8 mV
MDC IDC STAT BRADY RV PERCENT PACED: 37 %

## 2016-03-07 NOTE — Patient Instructions (Signed)

## 2016-03-07 NOTE — Progress Notes (Signed)
PCP: Ron ParkerBOWEN,SAMUEL, MD Primary Cardiologist:  Dr SwazilandJordan   Alison Dodson is a 82 y.o. female who presents today for routine electrophysiology followup.   She has advanced dementia.  She is not very active.  Daughter is with her today and is unaware of any recent concerns/ cardiac complaints.   Past Medical History  Diagnosis Date  . Anxiety   . Coronary artery disease   . Depression   . Hypertension   . Tachy-brady syndrome (HCC)   . Permanent atrial fibrillation (HCC)   . HX: anticoagulation   . Glaucoma   . Dizziness   . SSS (sick sinus syndrome) (HCC)   . GERD (gastroesophageal reflux disease)   . Fall   . Osteoarthritis   . Dementia    Past Surgical History  Procedure Laterality Date  . Insert / replace / remove pacemaker  01/24/2008    DDD PACEMAKER IMPLANT  . Cholecystectomy    . Gastric bypass    . Breast reduction surgery    . [place] echocardiography  08/10/2007    EF 60%  . [place] echocardiography  01/12/2007    EF 70%  . Cardiovascular stress test  02/15/2007  . Total knee arthroplasty      right  . Pacemaker insertion    . Permanent pacemaker generator change N/A 01/29/2013    Procedure: PERMANENT PACEMAKER GENERATOR CHANGE;  Surgeon: Hillis RangeJames Jemma Rasp, MD;  [facility];  Service: Cardiovascular;  Laterality: N/A;    Current Outpatient Prescriptions  Medication Sig Dispense Refill  . acetaminophen (TYLENOL) 500 MG tablet Take 500 mg by mouth every 4 (four) hours as needed for mild pain or headache (Not to exceed 4000 mg for fever up to 101.).    Marland Kitchen. alum & mag hydroxide-simeth (MINTOX) 200-200-20 MG/5ML suspension Take 30 mLs by mouth every 6 (six) hours as needed for indigestion or heartburn (Not to exceed 4 doses in 24 hours).    Marland Kitchen. aspirin EC 325 MG EC tablet Take 1 tablet (325 mg total) by mouth daily. 30 tablet 0  . atenolol (TENORMIN) 25 MG tablet Take 1 tablet by mouth as directed.    Marland Kitchen. atenolol (TENORMIN) 50 MG tablet Take 50 mg by mouth daily before breakfast.  Give with 25 mg to = 75 mg daily.  Hold if HR <60 or systolic BP <110    . azelastine (OPTIVAR) 0.05 % ophthalmic solution Place 1 drop into both eyes 2 (two) times daily.     Marland Kitchen. buPROPion (WELLBUTRIN XL) 300 MG 24 hr tablet Take 300 mg by mouth daily.    . carboxymethylcellulose (LUBRICANT EYE DROPS) 0.5 % SOLN Place 1 drop into both eyes 4 (four) times daily.     . cholecalciferol (VITAMIN D) 1000 UNITS tablet Take 1,000 Units by mouth daily.    . clopidogrel (PLAVIX) 75 MG tablet Take 1 tablet (75 mg total) by mouth daily with breakfast.    . Cranberry-Vitamin C-Vitamin E (CRANBERRY PLUS VITAMIN C PO) Take 2 capsules by mouth at bedtime.    . donepezil (ARICEPT) 10 MG tablet Take 1 tablet (10 mg total) by mouth every evening. Reported on 02/05/2016 30 tablet 0  . famotidine (PEPCID) 20 MG tablet Take 20 mg by mouth at bedtime.     Marland Kitchen. guaifenesin (ROBITUSSIN) 100 MG/5ML syrup Take 200 mg by mouth every 6 (six) hours as needed for cough.    . haloperidol (HALDOL) 0.5 MG tablet Take 0.5 mg by mouth every 6 (six) hours as  needed for agitation.    . hydrocerin (EUCERIN) CREA Apply 1 application topically daily.     Marland Kitchen latanoprost (XALATAN) 0.005 % ophthalmic solution Place 1 drop into both eyes at bedtime.     . lidocaine (LIDODERM) 5 % Place 1 patch onto the skin daily. Remove & Discard patch within 12 hours or as directed by MD 30 patch 0  . lisinopril (PRINIVIL,ZESTRIL) 20 MG tablet Take 1 tablet (20 mg total) by mouth daily. Reported on 02/05/2016 30 tablet 0  . lisinopril (PRINIVIL,ZESTRIL) 40 MG tablet Take 0.5 tablets by mouth as directed.    . loperamide (IMODIUM) 2 MG capsule Take 2 mg by mouth as needed for diarrhea or loose stools (Not to exceed 8 doses in 24 hours).    . Menthol-Zinc Oxide (RISAMINE) 0.44-20.625 % OINT Apply 1 application topically daily as needed (Apply to sacrum and buttocks after each incontinent episode).    . mirtazapine (REMERON) 15 MG tablet Take 15 mg by mouth at  bedtime.    Marland Kitchen nystatin (MYCOSTATIN/NYSTOP) 100000 UNIT/GM POWD Apply 1 application topically.    . pantoprazole (PROTONIX) 40 MG tablet Take 1 tablet (40 mg total) by mouth daily. Reported on 02/05/2016 30 tablet 0  . QUEtiapine (SEROQUEL) 25 MG tablet Take 1 tablet (25 mg total) by mouth 2 (two) times daily. Reported on 02/05/2016 30 tablet 0  . traMADol (ULTRAM) 50 MG tablet Take 1 tablet (50 mg total) by mouth every 6 (six) hours as needed for moderate pain. 30 tablet 0   No current facility-administered medications for this visit.   Facility-Administered Medications Ordered in Other Visits  Medication Dose Route Frequency Provider Last Rate Last Dose  . 0.45 % sodium chloride infusion   Intravenous Continuous Hillis Range, MD      . 0.9 %  sodium chloride infusion  250 mL Intravenous Continuous Hillis Range, MD      . chlorhexidine (HIBICLENS) 4 % liquid 4 application  60 mL Topical Once Hillis Range, MD      . sodium chloride 0.9 % injection 3 mL  3 mL Intravenous Q12H Hillis Range, MD      . sodium chloride 0.9 % injection 3 mL  3 mL Intravenous PRN Hillis Range, MD        Physical Exam: Filed Vitals:   03/07/16 1418  BP: 132/90  Pulse: 52  SpO2: 97%    GEN- The patient is elderly appearing, alert and oriented x 3 today.   Head- normocephalic, atraumatic Eyes-  Sclera clear, conjunctiva pink Ears- hearing intact Oropharynx- clear Lungs- Clear to ausculation bilaterally, normal work of breathing Chest- pacemaker pocket is well healed Heart- irregular rate and rhythm  GI- soft, NT, ND, + BS Extremities- no clubbing, cyanosis, or edema  Pacemaker interrogation- reviewed in detail today,  See PACEART report  Assessment and Plan:  1. Bradycardia/ tachycardia syndrome Normal pacemaker function See Pace Art report No changes today  2. Permanent afib Her CHADS2VASC score is at least 7.  We again discussed anticoagulation.  The patient's daughter is clear in her decision to  again decline  3. CAD Stable No change required today  4. HTN Stable No change required today  DNI/DNR per my discussion with family today  Return in 12 months for device follow-up  Hillis Range MD, [facility] 03/07/2016 2:50 PM

## 2016-03-10 ENCOUNTER — Encounter (HOSPITAL_COMMUNITY): Payer: Self-pay | Admitting: Emergency Medicine

## 2016-03-10 ENCOUNTER — Emergency Department (HOSPITAL_COMMUNITY): Payer: Medicare Other

## 2016-03-10 ENCOUNTER — Emergency Department (HOSPITAL_COMMUNITY)
Admission: EM | Admit: 2016-03-10 | Discharge: 2016-03-10 | Disposition: A | Discharge status: Home / Self Care | Payer: Medicare Other | Attending: Emergency Medicine | Admitting: Emergency Medicine

## 2016-03-10 DIAGNOSIS — F329 Major depressive disorder, single episode, unspecified: Secondary | ICD-10-CM | POA: Diagnosis not present

## 2016-03-10 DIAGNOSIS — Y92129 Unspecified place in nursing home as the place of occurrence of the external cause: Secondary | ICD-10-CM | POA: Diagnosis not present

## 2016-03-10 DIAGNOSIS — I482 Chronic atrial fibrillation: Secondary | ICD-10-CM | POA: Insufficient documentation

## 2016-03-10 DIAGNOSIS — Y998 Other external cause status: Secondary | ICD-10-CM | POA: Diagnosis not present

## 2016-03-10 DIAGNOSIS — M1990 Unspecified osteoarthritis, unspecified site: Secondary | ICD-10-CM | POA: Diagnosis not present

## 2016-03-10 DIAGNOSIS — F419 Anxiety disorder, unspecified: Secondary | ICD-10-CM | POA: Insufficient documentation

## 2016-03-10 DIAGNOSIS — K219 Gastro-esophageal reflux disease without esophagitis: Secondary | ICD-10-CM | POA: Diagnosis not present

## 2016-03-10 DIAGNOSIS — Z950 Presence of cardiac pacemaker: Secondary | ICD-10-CM | POA: Diagnosis not present

## 2016-03-10 DIAGNOSIS — S6992XA Unspecified injury of left wrist, hand and finger(s), initial encounter: Secondary | ICD-10-CM | POA: Diagnosis not present

## 2016-03-10 DIAGNOSIS — Z792 Long term (current) use of antibiotics: Secondary | ICD-10-CM | POA: Insufficient documentation

## 2016-03-10 DIAGNOSIS — W06XXXA Fall from bed, initial encounter: Secondary | ICD-10-CM | POA: Diagnosis not present

## 2016-03-10 DIAGNOSIS — Y9389 Activity, other specified: Secondary | ICD-10-CM | POA: Diagnosis not present

## 2016-03-10 DIAGNOSIS — Z87891 Personal history of nicotine dependence: Secondary | ICD-10-CM | POA: Insufficient documentation

## 2016-03-10 DIAGNOSIS — S0083XA Contusion of other part of head, initial encounter: Secondary | ICD-10-CM | POA: Insufficient documentation

## 2016-03-10 DIAGNOSIS — Z9884 Bariatric surgery status: Secondary | ICD-10-CM | POA: Diagnosis not present

## 2016-03-10 DIAGNOSIS — Z7902 Long term (current) use of antithrombotics/antiplatelets: Secondary | ICD-10-CM | POA: Diagnosis not present

## 2016-03-10 DIAGNOSIS — S8991XA Unspecified injury of right lower leg, initial encounter: Secondary | ICD-10-CM | POA: Diagnosis not present

## 2016-03-10 DIAGNOSIS — S6991XA Unspecified injury of right wrist, hand and finger(s), initial encounter: Secondary | ICD-10-CM | POA: Insufficient documentation

## 2016-03-10 DIAGNOSIS — S8992XA Unspecified injury of left lower leg, initial encounter: Secondary | ICD-10-CM | POA: Diagnosis present

## 2016-03-10 DIAGNOSIS — I2510 Atherosclerotic heart disease of native coronary artery without angina pectoris: Secondary | ICD-10-CM | POA: Insufficient documentation

## 2016-03-10 DIAGNOSIS — Z79899 Other long term (current) drug therapy: Secondary | ICD-10-CM | POA: Diagnosis not present

## 2016-03-10 DIAGNOSIS — W19XXXA Unspecified fall, initial encounter: Secondary | ICD-10-CM

## 2016-03-10 DIAGNOSIS — F0390 Unspecified dementia without behavioral disturbance: Secondary | ICD-10-CM | POA: Insufficient documentation

## 2016-03-10 DIAGNOSIS — H409 Unspecified glaucoma: Secondary | ICD-10-CM | POA: Diagnosis not present

## 2016-03-10 DIAGNOSIS — I10 Essential (primary) hypertension: Secondary | ICD-10-CM | POA: Insufficient documentation

## 2016-03-10 DIAGNOSIS — S81812A Laceration without foreign body, left lower leg, initial encounter: Secondary | ICD-10-CM | POA: Diagnosis not present

## 2016-03-10 LAB — BASIC METABOLIC PANEL
Anion gap: 11 (ref 5–15)
BUN: 19 mg/dL (ref 6–20)
CALCIUM: 9.7 mg/dL (ref 8.9–10.3)
CO2: 25 mmol/L (ref 22–32)
CREATININE: 1.08 mg/dL — AB (ref 0.44–1.00)
Chloride: 104 mmol/L (ref 101–111)
GFR calc non Af Amer: 45 mL/min — ABNORMAL LOW (ref >60)
GFR, EST AFRICAN AMERICAN: 52 mL/min — AB (ref >60)
GLUCOSE: 81 mg/dL (ref 65–99)
Potassium: 4.3 mmol/L (ref 3.5–5.1)
Sodium: 140 mmol/L (ref 135–145)

## 2016-03-10 LAB — URINALYSIS, ROUTINE W REFLEX MICROSCOPIC
BILIRUBIN URINE: NEGATIVE
GLUCOSE, UA: NEGATIVE mg/dL
HGB URINE DIPSTICK: NEGATIVE
Ketones, ur: NEGATIVE mg/dL
Leukocytes, UA: NEGATIVE
Nitrite: NEGATIVE
PROTEIN: NEGATIVE mg/dL
Specific Gravity, Urine: 1.02 (ref 1.005–1.030)
pH: 5.5 (ref 5.0–8.0)

## 2016-03-10 LAB — CBC
HCT: 44 % (ref 36.0–46.0)
Hemoglobin: 14.2 g/dL (ref 12.0–15.0)
MCH: 29.5 pg (ref 26.0–34.0)
MCHC: 32.3 g/dL (ref 30.0–36.0)
MCV: 91.5 fL (ref 78.0–100.0)
PLATELETS: 252 10*3/uL (ref 150–400)
RBC: 4.81 MIL/uL (ref 3.87–5.11)
RDW: 14.7 % (ref 11.5–15.5)
WBC: 7.7 10*3/uL (ref 4.0–10.5)

## 2016-03-10 LAB — CBG MONITORING, ED: GLUCOSE-CAPILLARY: 92 mg/dL (ref 65–99)

## 2016-03-10 LAB — PROTIME-INR
INR: 1.09 (ref 0.00–1.49)
Prothrombin Time: 14.3 seconds (ref 11.6–15.2)

## 2016-03-10 MED ORDER — ACETAMINOPHEN 160 MG/5ML PO SOLN: 650.0000 mg | Freq: Once | ORAL | Status: DC

## 2016-03-10 MED ORDER — ACETAMINOPHEN 160 MG/5ML PO SOLN: 650.0000 mg | Freq: Four times a day (QID) | ORAL | Status: AC

## 2016-03-10 NOTE — ED Notes (Signed)
PTAR at bedside 

## 2016-03-10 NOTE — ED Notes (Signed)
In and Out cath assisted by Micah FlesherK. Pelkey RN

## 2016-03-10 NOTE — ED Notes (Signed)
CBG- 92 

## 2016-03-10 NOTE — ED Notes (Signed)
Patient able to ambulate independently  

## 2016-03-10 NOTE — ED Provider Notes (Signed)
CSN: [id number]     Arrival date & time 03/10/16  1711 History   First MD Initiated Contact with Patient 03/10/16 1727     Chief Complaint  Patient presents with  . Fall  . Head Injury     (Consider location/radiation/quality/duration/timing/severity/associated sxs/prior Treatment) HPI   Alison Dodson is a 82 y.o. female presents to the emergency room via EMS from [place] [facility] where she had an unwitnessed fall out of bed, she was found on a pad on the floor next to her bed, she had obviously hit her head with bruising to her forehead and multiple contusions on her arms and legs.  She is reportedly at her baseline mental status, pleasantly confused and disoriented. She was placed in c-collar due to unknown mechanism, she only complains of discomfort of being in the collar, and she does not have any other complaints. Other history is obtained from chart and from EMS reports. EMS reported that the bruise on her forehead enlarged while patient was being transported to the ER. The patient is on Plavix. She does not have a history of frequent falls however staff at [facility] house reports that when she has fallen the past she has been found to have a UTI. At her baseline the patient does not ambulate.  Staff at [facility] reports pt appears alert and at her baseline s/p fall.    Level V Caveat - dementia  Past Medical History  Diagnosis Date  . Anxiety   . Coronary artery disease   . Depression   . Hypertension   . Tachy-brady syndrome (HCC)   . Permanent atrial fibrillation (HCC)   . HX: anticoagulation   . Glaucoma   . Dizziness   . SSS (sick sinus syndrome) (HCC)   . GERD (gastroesophageal reflux disease)   . Fall   . Osteoarthritis   . Dementia    Past Surgical History  Procedure Laterality Date  . Insert / replace / remove pacemaker  01/24/2008    DDD PACEMAKER IMPLANT  . Cholecystectomy    . Gastric bypass    . Breast reduction surgery    . US echocardiography   08/10/2007    EF 60%  . US echocardiography  01/12/2007    EF 70%  . Cardiovascular stress test  02/15/2007  . Total knee arthroplasty      right  . Pacemaker insertion    . Permanent pacemaker generator change N/A 01/29/2013    Procedure: PERMANENT PACEMAKER GENERATOR CHANGE;  Surgeon: Hillis Range, MD;  Location: [facility] CATH LAB;  Service: Cardiovascular;  Laterality: N/A;   Family History  Problem Relation Age of Onset  . Kidney failure Mother   . Heart attack Father   . Heart attack Sister   . Heart attack Brother    Social History  Substance Use Topics  . Smoking status: Former Smoker    Quit date: 07/17/1961  . Smokeless tobacco: None  . Alcohol Use: No   OB History    No data available     Review of Systems  Unable to perform ROS: Dementia      Allergies  Codeine and Iodine  Home Medications   Prior to Admission medications   Medication Sig Start Date End Date Taking? Authorizing Provider  acetaminophen (TYLENOL) 500 MG tablet Take 500 mg by mouth every 4 (four) hours as needed for mild pain or headache (Not to exceed 4000 mg for fever up to 101.).   Yes Historical Provider, MD  alum & mag hydroxide-simeth (MINTOX) 200-200-20 MG/5ML suspension Take 30 mLs by mouth every 6 (six) hours as needed for indigestion or heartburn (Not to exceed 4 doses in 24 hours).   Yes Historical Provider, MD  atenolol (TENORMIN) 25 MG tablet Take 1 tablet by mouth daily. Take 1 tablet every morning with  tab=75mg  *HOLD IF HR<60 or SBP <110 02/24/16  Yes Historical Provider, MD  atenolol (TENORMIN) 50 MG tablet Take 50 mg by mouth daily before breakfast. Give with 25 mg to = 75 mg daily.  Hold if HR <60 or systolic BP <110   Yes Historical Provider, MD  azelastine (OPTIVAR) 0.05 % ophthalmic solution Place 1 drop into both eyes 2 (two) times daily.    Yes Historical Provider, MD  buPROPion (WELLBUTRIN XL) 300 MG 24 hr tablet Take 300 mg by mouth daily.   Yes Historical Provider, MD    carboxymethylcellulose (LUBRICANT EYE DROPS) 0.5 % SOLN Place 1 drop into both eyes 4 (four) times daily.    Yes Historical Provider, MD  cholecalciferol (VITAMIN D) 1000 UNITS tablet Take 1,000 Units by mouth daily.   Yes Historical Provider, MD  clopidogrel (PLAVIX) 75 MG tablet Take 1 tablet (75 mg total) by mouth daily with breakfast. 03/08/13  Yes Rhetta Mura, MD  Cranberry 450 MG TABS Take 1 tablet by mouth daily.   Yes Historical Provider, MD  donepezil (ARICEPT) 10 MG tablet Take 1 tablet (10 mg total) by mouth every evening. Reported on 02/05/2016 02/07/16  Yes Drema Dallas, MD  famotidine (PEPCID) 20 MG tablet Take 20 mg by mouth at bedtime.  12/30/15  Yes Historical Provider, MD  haloperidol (HALDOL) 0.5 MG tablet Take 0.5 mg by mouth every 6 (six) hours as needed for agitation.   Yes Historical Provider, MD  hydrocerin (EUCERIN) CREA Apply 1 application topically daily.    Yes Historical Provider, MD  latanoprost (XALATAN) 0.005 % ophthalmic solution Place 1 drop into both eyes at bedtime.    Yes Historical Provider, MD  lidocaine (LIDODERM) 5 % Place 1 patch onto the skin daily. Remove & Discard patch within 12 hours or as directed by MD Patient taking differently: Place 1 patch onto the skin daily. Remove & Discard patch within 12 hours or as directed by MD 1/2 patch to left shoulder every day as directed remove bedtime *May Cut Patch* 02/07/16  Yes Drema Dallas, MD  lisinopril (PRINIVIL,ZESTRIL) 20 MG tablet Take 1 tablet (20 mg total) by mouth daily. Reported on 02/05/2016 02/07/16  Yes Drema Dallas, MD  loperamide (IMODIUM) 2 MG capsule Take 2 mg by mouth as needed for diarrhea or loose stools (Not to exceed 8 doses in 24 hours).   Yes Historical Provider, MD  Menthol-Zinc Oxide (RISAMINE) 0.44-20.625 % OINT Apply 1 application topically daily as needed (Apply to sacrum and buttocks after each incontinent episode).   Yes Historical Provider, MD  mirtazapine (REMERON) 15 MG  tablet Take 15 mg by mouth at bedtime.   Yes Historical Provider, MD  neomycin-bacitracin-polymyxin (NEOSPORIN) [phone] ointment Apply 1 application topically daily.   Yes Historical Provider, MD  nystatin (MYCOSTATIN/NYSTOP) 100000 UNIT/GM POWD Apply 1 application topically. 11/23/15  Yes Historical Provider, MD  QUEtiapine (SEROQUEL) 25 MG tablet Take 1 tablet (25 mg total) by mouth 2 (two) times daily. Reported on 02/05/2016 02/07/16  Yes Drema Dallas, MD  traMADol (ULTRAM) 50 MG tablet Take 1 tablet (50 mg total) by mouth every 6 (six) hours as needed  for moderate pain. 02/07/16  Yes Drema Dallas, MD  aspirin EC 325 MG EC tablet Take 1 tablet (325 mg total) by mouth daily. Patient not taking: Reported on 03/10/2016 02/07/16   Drema Dallas, MD  pantoprazole (PROTONIX) 40 MG tablet Take 1 tablet (40 mg total) by mouth daily. Reported on 02/05/2016 Patient not taking: Reported on 03/10/2016 02/07/16   Drema Dallas, MD   BP 148/98 mmHg  Pulse 78  Temp(Src) 97.9 F (36.6 C) (Oral)  Resp 16  SpO2 98% Physical Exam  Constitutional: She appears well-developed and well-nourished.  Non-toxic appearance. No distress. Cervical collar in place.  Elderly frail female, alert and pleasantly confused, talkative, in C-collar  HENT:  Head: Normocephalic. Head is with contusion. Head is without raccoon's eyes, without Battle's sign, without abrasion, without right periorbital erythema and without left periorbital erythema.  Right Ear: Tympanic membrane and external ear normal.  Left Ear: Tympanic membrane and external ear normal.  Nose: Nose normal. No nasal septal hematoma. No epistaxis. Right sinus exhibits no maxillary sinus tenderness and no frontal sinus tenderness. Left sinus exhibits no maxillary sinus tenderness and no frontal sinus tenderness.  Mouth/Throat: Uvula is midline, oropharynx is clear and moist and mucous membranes are normal. No lacerations. No oropharyngeal exudate.  Ecchymosis to  right forehead (see picture) No depressed skull fx palpated, facial bone intact w/o ttp  Eyes: Conjunctivae, EOM and lids are normal. Pupils are equal, round, and reactive to light. Right eye exhibits no discharge. Left eye exhibits no discharge. No scleral icterus. Left eye abnormal extraocular motion: normal tracking bilaterally.  Neck: Phonation normal. No JVD present. No thyromegaly present.  In C-collar  Cardiovascular: Normal rate, regular rhythm, normal heart sounds and intact distal pulses.  Exam reveals no gallop and no friction rub.   No murmur heard. Pulmonary/Chest: Effort normal and breath sounds normal. No respiratory distress. She has no wheezes. She has no rhonchi. She has no rales. She exhibits no tenderness.  Abdominal: Soft. Bowel sounds are normal. She exhibits no distension and no mass. There is no tenderness. There is no rebound and no guarding.  Musculoskeletal: She exhibits no edema or tenderness.  Lymphadenopathy:    She has no cervical adenopathy.  Neurological: She is alert. She displays no tremor. She exhibits normal muscle tone. She displays no seizure activity. GCS eye subscore is 4. GCS verbal subscore is 4. GCS motor subscore is 6.  Oriented to person (x1) Follows some simple commands  Skin: Skin is warm. No rash noted. She is not diaphoretic. There is erythema. No pallor.  Skin tears to LLE, various areas of bruising to bilateral LE (see pictures)  Psychiatric: She has a normal mood and affect. Her behavior is normal. Judgment and thought content normal.  Nursing note and vitals reviewed.         ED Course  Procedures (including critical care time)  LACERATION REPAIR Performed by: Danelle Berry Consent: Verbal consent obtained. Risks and benefits: risks, benefits and alternatives were discussed Patient identity confirmed: provided demographic data Time out performed prior to procedure Prepped and Draped in normal sterile fashion Wound  explored Laceration Location: left shin (skin tear) Laceration Length: 2x2 cm V-shaped skin tear No Foreign Bodies seen or palpated Anesthesia:  none Local anesthetic: N/A Anesthetic total:  N/A Irrigation method: syringe Amount of cleaning: standard, skin edged re-approximated with q-tips after irrigation Skin closure: steristrips with benzoin Number: 2 Patient tolerance: Patient tolerated the procedure well with no immediate  complications.   Labs Review Labs Reviewed  CBC  BASIC METABOLIC PANEL  URINALYSIS, ROUTINE W REFLEX MICROSCOPIC (NOT AT [facility])  PROTIME-INR  CBG MONITORING, ED    Imaging Review No results found. I have personally reviewed and evaluated these images and lab results as part of my medical decision-making.   EKG Interpretation None      MDM   Unwitnessed fall of elderly female, found out of bed after a nap, on a mat on the floor.  Hx of dementia. Pt on baby ASA and plavix, unknown LOC. Labs, UA, head and neck CT ordered Skin tear repaired with steristrips, second skin tear unrepairable, non-adherant dressing and bandages applied, wound care discussed with pt's daughter (POA).  Head and neck CT negative for acute pathology, labs and UA unremarkable. Pt given tylenol at the request of her daughter.  [facility], order placed for a few days of tylenol, she does have PRN tramadol available, however pt is unable to report pain or request PRN meds.  Pt's daughter encouraged to seek follow up/wound recheck for her mother.  REturn precautions reviewed.  Pt d/c in good condition, VSS. Filed Vitals:   03/10/16 1721 03/10/16 1730 03/10/16 2015 03/10/16 2030  BP: 162/117 148/98 131/76 130/85  Pulse: 74 78 67 74  Temp: 97.9 F (36.6 C)     TempSrc: Oral     Resp: 18 16 17 21   SpO2: 100% 98% 99% 96%       Final diagnoses:  Fall, initial encounter  Forehead contusion, initial encounter  Skin tear of left lower leg  without complication, initial encounter        Danelle BerryLeisa Glendi Mohiuddin, PA-C 03/13/16 1840  Danelle BerryLeisa Bernardine Langworthy, PA-C 03/13/16 1841  Nelva Nayobert Beaton, MD 03/17/16 [phone]

## 2016-03-10 NOTE — ED Notes (Signed)
[facility] contacted to transport patient to [facility]  

## 2016-03-10 NOTE — Discharge Instructions (Signed)
Head Injury, Adult °You have a head injury. Headaches and throwing up (vomiting) are common after a head injury. It should be easy to wake up from sleeping. Sometimes you must stay in the hospital. Most problems happen within the first 24 hours. Side effects may occur up to 7-10 days after the injury.  °WHAT ARE THE TYPES OF HEAD INJURIES? °Head injuries can be as minor as a bump. Some head injuries can be more severe. More severe head injuries include: °· A jarring injury to the brain (concussion). °· A bruise of the brain (contusion). This mean there is bleeding in the brain that can cause swelling. °· A cracked skull (skull fracture). °· Bleeding in the brain that collects, clots, and forms a bump (hematoma). °WHEN SHOULD I GET HELP RIGHT AWAY?  °· You are confused or sleepy. °· You cannot be woken up. °· You feel sick to your stomach (nauseous) or keep throwing up (vomiting). °· Your dizziness or unsteadiness is getting worse. °· You have very bad, lasting headaches that are not helped by medicine. Take medicines only as told by your doctor. °· You cannot use your arms or legs like normal. °· You cannot walk. °· You notice changes in the black spots in the center of the colored part of your eye (pupil). °· You have clear or bloody fluid coming from your nose or ears. °· You have trouble seeing. °During the next 24 hours after the injury, you must stay with someone who can watch you. This person should get help right away (call 911 in the [place] if you start to shake and are not able to control it (have seizures), you pass out, or you are unable to wake up. °HOW CAN I PREVENT A HEAD INJURY IN THE FUTURE? °· Wear seat belts. °· Wear a helmet while bike riding and playing sports like football. °· Stay away from dangerous activities around the house. °WHEN CAN I RETURN TO NORMAL ACTIVITIES AND ATHLETICS? °See your doctor before doing these activities. You should not do normal activities or play contact sports until 1  week after the following symptoms have stopped: °· Headache that does not go away. °· Dizziness. °· Poor attention. °· Confusion. °· Memory problems. °· Sickness to your stomach or throwing up. °· Tiredness. °· Fussiness. °· Bothered by bright lights or loud noises. °· Anxiousness or depression. °· Restless sleep. °MAKE SURE YOU:  °· Understand these instructions. °· Will watch your condition. °· Will get help right away if you are not doing well or get worse. °  °This information is not intended to replace advice given to you by your health care provider. Make sure you discuss any questions you have with your health care provider. °  °Document Released: 10/20/2008 Document Revised: 11/28/2014 Document Reviewed: 07/15/2013 °Elsevier Interactive Patient Education ©2016 [employer] ° °Facial or Scalp Contusion °A facial or scalp contusion is a deep bruise on the face or head. Injuries to the face and head generally cause a lot of swelling, especially around the eyes. Contusions are the result of an injury that caused bleeding under the skin. The contusion may turn blue, purple, or yellow. Minor injuries will give you a painless contusion, but more severe contusions may stay painful and swollen for a few weeks.  °CAUSES  °A facial or scalp contusion is caused by a blunt injury or trauma to the face or head area.  °SIGNS AND SYMPTOMS  °· Swelling of the injured area.   °· Discoloration   of the injured area.   Tenderness, soreness, or pain in the injured area.  DIAGNOSIS  The diagnosis can be made by taking a medical history and doing a physical exam. An X-ray exam, CT scan, or MRI may be needed to determine if there are any associated injuries, such as broken bones (fractures). TREATMENT  Often, the best treatment for a facial or scalp contusion is applying cold compresses to the injured area. Over-the-counter medicines may also be recommended for pain control.  HOME CARE INSTRUCTIONS   Only take  over-the-counter or prescription medicines as directed by your health care provider.   Apply ice to the injured area.   Put ice in a plastic bag.   Place a towel between your skin and the bag.   Leave the ice on for 20 minutes, 2-3 times a day.  SEEK MEDICAL CARE IF:  You have bite problems.   You have pain with chewing.   You are concerned about facial defects. SEEK IMMEDIATE MEDICAL CARE IF:  You have severe pain or a headache that is not relieved by medicine.   You have unusual sleepiness, confusion, or personality changes.   You throw up (vomit).   You have a persistent nosebleed.   You have double vision or blurred vision.   You have fluid drainage from your nose or ear.   You have difficulty walking or using your arms or legs.  MAKE SURE YOU:   Understand these instructions.  Will watch your condition.  Will get help right away if you are not doing well or get worse.   This information is not intended to replace advice given to you by your health care provider. Make sure you discuss any questions you have with your health care provider.   Document Released: 12/15/2004 Document Revised: 11/28/2014 Document Reviewed: 06/20/2013 Elsevier Interactive Patient Education 2016 [employer]  Wound Care Taking care of your wound properly can help to prevent pain and infection. It can also help your wound to heal more quickly.  HOW TO CARE FOR YOUR WOUND  Take or apply over-the-counter and prescription medicines only as told by your health care provider.  If you were prescribed antibiotic medicine, take or apply it as told by your health care provider. Do not stop using the antibiotic even if your condition improves.  Clean the wound each day or as told by your health care provider.  Wash the wound with mild soap and water.  Rinse the wound with water to remove all soap.  Pat the wound dry with a clean towel. Do not rub it.  There are many  different ways to close and cover a wound. For example, a wound can be covered with stitches (sutures), skin glue, or adhesive strips. Follow instructions from your health care provider about:  How to take care of your wound.  When and how you should change your bandage (dressing).  When you should remove your dressing.  Removing whatever was used to close your wound.  Check your wound every day for signs of infection. Watch for:  Redness, swelling, or pain.  Fluid, blood, or pus.  Keep the dressing dry until your health care provider says it can be removed. Do not take baths, swim, use a hot tub, or do anything that would put your wound underwater until your health care provider approves.  Raise (elevate) the injured area above the level of your heart while you are sitting or lying down.  Do not scratch or  pick at the wound.  Keep all follow-up visits as told by your health care provider. This is important. SEEK MEDICAL CARE IF:  You received a tetanus shot and you have swelling, severe pain, redness, or bleeding at the injection site.  You have a fever.  Your pain is not controlled with medicine.  You have increased redness, swelling, or pain at the site of your wound.  You have fluid, blood, or pus coming from your wound.  You notice a bad smell coming from your wound or your dressing. SEEK IMMEDIATE MEDICAL CARE IF:  You have a red streak going away from your wound.   This information is not intended to replace advice given to you by your health care provider. Make sure you discuss any questions you have with your health care provider.   Document Released: 08/16/2008 Document Revised: 03/24/2015 Document Reviewed: 11/03/2014 Elsevier Interactive Patient Education 2016 [employer]  Contusion A contusion is a deep bruise. Contusions are the result of a blunt injury to tissues and muscle fibers under the skin. The injury causes bleeding under the skin. The skin  overlying the contusion may turn blue, purple, or yellow. Minor injuries will give you a painless contusion, but more severe contusions may stay painful and swollen for a few weeks.  CAUSES  This condition is usually caused by a blow, trauma, or direct force to an area of the body. SYMPTOMS  Symptoms of this condition include:  Swelling of the injured area.  Pain and tenderness in the injured area.  Discoloration. The area may have redness and then turn blue, purple, or yellow. DIAGNOSIS  This condition is diagnosed based on a physical exam and medical history. An X-ray, CT scan, or MRI may be needed to determine if there are any associated injuries, such as broken bones (fractures). TREATMENT  Specific treatment for this condition depends on what area of the body was injured. In general, the best treatment for a contusion is resting, icing, applying pressure to (compression), and elevating the injured area. This is often called the RICE strategy. Over-the-counter anti-inflammatory medicines may also be recommended for pain control.  HOME CARE INSTRUCTIONS   Rest the injured area.  If directed, apply ice to the injured area:  Put ice in a plastic bag.  Place a towel between your skin and the bag.  Leave the ice on for 20 minutes, 2-3 times per day.  If directed, apply light compression to the injured area using an elastic bandage. Make sure the bandage is not wrapped too tightly. Remove and reapply the bandage as directed by your health care provider.  If possible, raise (elevate) the injured area above the level of your heart while you are sitting or lying down.  Take over-the-counter and prescription medicines only as told by your health care provider. SEEK MEDICAL CARE IF:  Your symptoms do not improve after several days of treatment.  Your symptoms get worse.  You have difficulty moving the injured area. SEEK IMMEDIATE MEDICAL CARE IF:   You have severe pain.  You have  numbness in a hand or foot.  Your hand or foot turns pale or cold.   This information is not intended to replace advice given to you by your health care provider. Make sure you discuss any questions you have with your health care provider.   Document Released: 08/17/2005 Document Revised: 07/29/2015 Document Reviewed: 03/25/2015 Elsevier Interactive Patient Education [employer]

## 2016-03-10 NOTE — ED Notes (Signed)
Phlebotomy at bedside.

## 2016-03-10 NOTE — ED Notes (Signed)
Per eMS:  [facility] had an unwitnessed fall out of bed on to the pad on the floor beside her bed and hit her head.  Bruise to forehead grew during transport.  Pt is on Plavix.  Pt does not have a lot of falls, but when she does staff states "it's usually because of a UTI".  Pt does not ambulate.  Pt is alert to norm, based on staff review.  Pt is disoriented x 3.  Pt in c-collar due to mechanism, denies any pain at this time.

## 2017-03-23 ENCOUNTER — Inpatient Hospital Stay (HOSPITAL_COMMUNITY)
Admission: EM | Admit: 2017-03-23 | Discharge: 2017-03-28 | DRG: 066 | Disposition: A | Discharge status: Skilled Nursing Facility | Payer: Medicare Other | Attending: Student in an Organized Health Care Education/Training Program | Admitting: Student in an Organized Health Care Education/Training Program

## 2017-03-23 ENCOUNTER — Emergency Department (HOSPITAL_COMMUNITY): Payer: Medicare Other

## 2017-03-23 ENCOUNTER — Encounter (HOSPITAL_COMMUNITY): Payer: Self-pay

## 2017-03-23 DIAGNOSIS — Z841 Family history of disorders of kidney and ureter: Secondary | ICD-10-CM

## 2017-03-23 DIAGNOSIS — Z66 Do not resuscitate: Secondary | ICD-10-CM | POA: Diagnosis present

## 2017-03-23 DIAGNOSIS — Z888 Allergy status to other drugs, medicaments and biological substances status: Secondary | ICD-10-CM | POA: Diagnosis not present

## 2017-03-23 DIAGNOSIS — Z885 Allergy status to narcotic agent status: Secondary | ICD-10-CM

## 2017-03-23 DIAGNOSIS — Z9181 History of falling: Secondary | ICD-10-CM | POA: Diagnosis not present

## 2017-03-23 DIAGNOSIS — I10 Essential (primary) hypertension: Secondary | ICD-10-CM | POA: Diagnosis present

## 2017-03-23 DIAGNOSIS — K219 Gastro-esophageal reflux disease without esophagitis: Secondary | ICD-10-CM | POA: Diagnosis present

## 2017-03-23 DIAGNOSIS — F419 Anxiety disorder, unspecified: Secondary | ICD-10-CM | POA: Diagnosis present

## 2017-03-23 DIAGNOSIS — Z8679 Personal history of other diseases of the circulatory system: Secondary | ICD-10-CM | POA: Diagnosis not present

## 2017-03-23 DIAGNOSIS — Z9049 Acquired absence of other specified parts of digestive tract: Secondary | ICD-10-CM | POA: Diagnosis not present

## 2017-03-23 DIAGNOSIS — M1990 Unspecified osteoarthritis, unspecified site: Secondary | ICD-10-CM | POA: Diagnosis present

## 2017-03-23 DIAGNOSIS — Z96651 Presence of right artificial knee joint: Secondary | ICD-10-CM | POA: Diagnosis present

## 2017-03-23 DIAGNOSIS — F0390 Unspecified dementia without behavioral disturbance: Secondary | ICD-10-CM

## 2017-03-23 DIAGNOSIS — Z9884 Bariatric surgery status: Secondary | ICD-10-CM

## 2017-03-23 DIAGNOSIS — I63412 Cerebral infarction due to embolism of left middle cerebral artery: Principal | ICD-10-CM | POA: Diagnosis present

## 2017-03-23 DIAGNOSIS — R2990 Unspecified symptoms and signs involving the nervous system: Secondary | ICD-10-CM

## 2017-03-23 DIAGNOSIS — I272 Pulmonary hypertension, unspecified: Secondary | ICD-10-CM | POA: Diagnosis not present

## 2017-03-23 DIAGNOSIS — I639 Cerebral infarction, unspecified: Secondary | ICD-10-CM

## 2017-03-23 DIAGNOSIS — Z8249 Family history of ischemic heart disease and other diseases of the circulatory system: Secondary | ICD-10-CM

## 2017-03-23 DIAGNOSIS — I482 Chronic atrial fibrillation, unspecified: Secondary | ICD-10-CM | POA: Diagnosis present

## 2017-03-23 DIAGNOSIS — Z993 Dependence on wheelchair: Secondary | ICD-10-CM | POA: Diagnosis not present

## 2017-03-23 DIAGNOSIS — Z7982 Long term (current) use of aspirin: Secondary | ICD-10-CM

## 2017-03-23 DIAGNOSIS — F0280 Dementia in other diseases classified elsewhere without behavioral disturbance: Secondary | ICD-10-CM | POA: Diagnosis present

## 2017-03-23 DIAGNOSIS — F329 Major depressive disorder, single episode, unspecified: Secondary | ICD-10-CM | POA: Diagnosis present

## 2017-03-23 DIAGNOSIS — H409 Unspecified glaucoma: Secondary | ICD-10-CM | POA: Diagnosis present

## 2017-03-23 DIAGNOSIS — I495 Sick sinus syndrome: Secondary | ICD-10-CM | POA: Diagnosis not present

## 2017-03-23 DIAGNOSIS — Z950 Presence of cardiac pacemaker: Secondary | ICD-10-CM | POA: Diagnosis not present

## 2017-03-23 DIAGNOSIS — R29810 Facial weakness: Secondary | ICD-10-CM

## 2017-03-23 DIAGNOSIS — R4701 Aphasia: Secondary | ICD-10-CM | POA: Diagnosis present

## 2017-03-23 DIAGNOSIS — Z87891 Personal history of nicotine dependence: Secondary | ICD-10-CM | POA: Diagnosis not present

## 2017-03-23 DIAGNOSIS — R531 Weakness: Secondary | ICD-10-CM | POA: Diagnosis present

## 2017-03-23 DIAGNOSIS — I6300 Cerebral infarction due to thrombosis of unspecified precerebral artery: Secondary | ICD-10-CM | POA: Diagnosis not present

## 2017-03-23 DIAGNOSIS — R4702 Dysphasia: Secondary | ICD-10-CM

## 2017-03-23 DIAGNOSIS — I2510 Atherosclerotic heart disease of native coronary artery without angina pectoris: Secondary | ICD-10-CM | POA: Diagnosis present

## 2017-03-23 DIAGNOSIS — I638 Other cerebral infarction: Secondary | ICD-10-CM | POA: Diagnosis not present

## 2017-03-23 DIAGNOSIS — Z8673 Personal history of transient ischemic attack (TIA), and cerebral infarction without residual deficits: Secondary | ICD-10-CM

## 2017-03-23 DIAGNOSIS — G301 Alzheimer's disease with late onset: Secondary | ICD-10-CM | POA: Diagnosis not present

## 2017-03-23 DIAGNOSIS — Z7902 Long term (current) use of antithrombotics/antiplatelets: Secondary | ICD-10-CM | POA: Diagnosis not present

## 2017-03-23 DIAGNOSIS — G309 Alzheimer's disease, unspecified: Secondary | ICD-10-CM | POA: Diagnosis present

## 2017-03-23 LAB — COMPREHENSIVE METABOLIC PANEL
ALK PHOS: 109 U/L (ref 38–126)
ALT: 12 U/L — ABNORMAL LOW (ref 14–54)
AST: 25 U/L (ref 15–41)
Albumin: 3.7 g/dL (ref 3.5–5.0)
Anion gap: 6 (ref 5–15)
BUN: 15 mg/dL (ref 6–20)
CALCIUM: 9.5 mg/dL (ref 8.9–10.3)
CHLORIDE: 110 mmol/L (ref 101–111)
CO2: 25 mmol/L (ref 22–32)
Creatinine, Ser: 1.02 mg/dL — ABNORMAL HIGH (ref 0.44–1.00)
GFR calc Af Amer: 56 mL/min — ABNORMAL LOW (ref >60)
GFR calc non Af Amer: 48 mL/min — ABNORMAL LOW (ref >60)
GLUCOSE: 104 mg/dL — AB (ref 65–99)
Potassium: 4.1 mmol/L (ref 3.5–5.1)
SODIUM: 141 mmol/L (ref 135–145)
Total Bilirubin: 1 mg/dL (ref 0.3–1.2)
Total Protein: 6.7 g/dL (ref 6.5–8.1)

## 2017-03-23 LAB — DIFFERENTIAL
Basophils Absolute: 0 10*3/uL (ref 0.0–0.1)
Basophils Relative: 0 %
Eosinophils Absolute: 0.1 10*3/uL (ref 0.0–0.7)
Eosinophils Relative: 1 %
LYMPHS ABS: 1.5 10*3/uL (ref 0.7–4.0)
LYMPHS PCT: 25 %
Monocytes Absolute: 0.3 10*3/uL (ref 0.1–1.0)
Monocytes Relative: 4 %
NEUTROS ABS: 4.3 10*3/uL (ref 1.7–7.7)
NEUTROS PCT: 70 %

## 2017-03-23 LAB — I-STAT CHEM 8, ED
BUN: 20 mg/dL (ref 6–20)
CHLORIDE: 107 mmol/L (ref 101–111)
CREATININE: 1 mg/dL (ref 0.44–1.00)
Calcium, Ion: 1.17 mmol/L (ref 1.15–1.40)
GLUCOSE: 98 mg/dL (ref 65–99)
HCT: 46 % (ref 36.0–46.0)
Hemoglobin: 15.6 g/dL — ABNORMAL HIGH (ref 12.0–15.0)
POTASSIUM: 4.3 mmol/L (ref 3.5–5.1)
Sodium: 143 mmol/L (ref 135–145)
TCO2: 27 mmol/L (ref 0–100)

## 2017-03-23 LAB — URINALYSIS, ROUTINE W REFLEX MICROSCOPIC
Bilirubin Urine: NEGATIVE
GLUCOSE, UA: NEGATIVE mg/dL
Hgb urine dipstick: NEGATIVE
Ketones, ur: NEGATIVE mg/dL
LEUKOCYTES UA: NEGATIVE
Nitrite: NEGATIVE
PH: 7 (ref 5.0–8.0)
PROTEIN: NEGATIVE mg/dL
SPECIFIC GRAVITY, URINE: 1.012 (ref 1.005–1.030)

## 2017-03-23 LAB — I-STAT TROPONIN, ED: Troponin i, poc: 0 ng/mL (ref 0.00–0.08)

## 2017-03-23 LAB — APTT: aPTT: 29 seconds (ref 24–36)

## 2017-03-23 LAB — CBC
HCT: 44.5 % (ref 36.0–46.0)
HEMOGLOBIN: 14.6 g/dL (ref 12.0–15.0)
MCH: 29.2 pg (ref 26.0–34.0)
MCHC: 32.8 g/dL (ref 30.0–36.0)
MCV: 89 fL (ref 78.0–100.0)
PLATELETS: 217 10*3/uL (ref 150–400)
RBC: 5 MIL/uL (ref 3.87–5.11)
RDW: 14.8 % (ref 11.5–15.5)
WBC: 6.2 10*3/uL (ref 4.0–10.5)

## 2017-03-23 LAB — PROTIME-INR
INR: 1.23
PROTHROMBIN TIME: 15.5 s — AB (ref 11.4–15.2)

## 2017-03-23 MED ORDER — KETOTIFEN FUMARATE 0.025 % OP SOLN
1.0000 [drp] | Freq: Two times a day (BID) | OPHTHALMIC | Status: DC
  Administered 2017-03-23 – 2017-03-28 (×10): 1 [drp] via OPHTHALMIC
  Filled 2017-03-23 (×3): qty 5

## 2017-03-23 MED ORDER — SODIUM CHLORIDE 0.9 % IV SOLN
INTRAVENOUS | Status: AC
  Administered 2017-03-23: 18:00:00 via INTRAVENOUS

## 2017-03-23 MED ORDER — ASPIRIN 325 MG PO TABS
325.0000 mg | ORAL_TABLET | Freq: Every day | ORAL | Status: DC
  Administered 2017-03-25: 325 mg via ORAL
  Filled 2017-03-23: qty 1

## 2017-03-23 MED ORDER — LATANOPROST 0.005 % OP SOLN
1.0000 [drp] | Freq: Every day | OPHTHALMIC | Status: DC
  Administered 2017-03-23 – 2017-03-27 (×5): 1 [drp] via OPHTHALMIC
  Filled 2017-03-23 (×3): qty 2.5

## 2017-03-23 MED ORDER — STROKE: EARLY STAGES OF RECOVERY BOOK
Freq: Once | Status: AC
  Administered 2017-03-23: 18:00:00
  Filled 2017-03-23: qty 1

## 2017-03-23 MED ORDER — ASPIRIN 300 MG RE SUPP
300.0000 mg | Freq: Every day | RECTAL | Status: DC
  Administered 2017-03-23 – 2017-03-24 (×2): 300 mg via RECTAL
  Filled 2017-03-23 (×2): qty 1

## 2017-03-23 MED ORDER — ENOXAPARIN SODIUM 40 MG/0.4ML ~~LOC~~ SOLN
40.0000 mg | SUBCUTANEOUS | Status: DC
  Administered 2017-03-23: 40 mg via SUBCUTANEOUS
  Filled 2017-03-23 (×3): qty 0.4

## 2017-03-23 NOTE — Consult Note (Addendum)
Referring Physician: Dr Juleen China    Chief Complaint: Stroke  HPI: Alison Dodson is an 83 y.o. female with a history of dementia, coronary artery disease, sick sinus syndrome with a permanent pacemaker, permanent atrial fibrillation, and hypertension. She is a resident at a skilled nursing facility. She has a daughter who lives locally and sees her about 3 times per week. There are also other children spread across [place] country. [place] has power of attorney and will be here tomorrow. The patient has been on aspirin 81 mg and Plavix for her history of coronary artery disease. This morning the staff was giving medications when they noted the patient to be aphasic and not following commands. This was at approximately 7 AM; however, the onset of symptoms may have occurred during the night. A CT scan of the head on arrival to the emergency department did not show any acute abnormalities. The patient cannot have an MRI secondary to her permanent pacemaker. The plan will be to repeat the head CT tomorrow. The patient's daughter has expressed that she does not want an aggressive workup but she agrees with the repeat CT.  Date last known well: Unable to determine Time last known well: Unable to determine tPA Given: No: unknown time of onset.  Past Medical History:  Diagnosis Date  . Anxiety   . Coronary artery disease   . Dementia   . Depression   . Dizziness   . Fall   . GERD (gastroesophageal reflux disease)   . Glaucoma   . HX: anticoagulation   . Hypertension   . Osteoarthritis   . Permanent atrial fibrillation (HCC)   . SSS (sick sinus syndrome) (HCC)   . Tachy-brady syndrome [facility])     Past Surgical History:  Procedure Laterality Date  . BREAST REDUCTION SURGERY    . CARDIOVASCULAR STRESS TEST  02/15/2007  . CHOLECYSTECTOMY    . GASTRIC BYPASS    . INSERT / REPLACE / REMOVE PACEMAKER  01/24/2008   DDD PACEMAKER IMPLANT  . PACEMAKER INSERTION    . PERMANENT  PACEMAKER GENERATOR CHANGE N/A 01/29/2013   Procedure: PERMANENT PACEMAKER GENERATOR CHANGE;  Surgeon: Hillis Range, MD;  Location: [facility] CATH LAB;  Service: Cardiovascular;  Laterality: N/A;  . TOTAL KNEE ARTHROPLASTY     right  . US ECHOCARDIOGRAPHY  08/10/2007   EF 60%  . US ECHOCARDIOGRAPHY  01/12/2007   EF 70%    Family History  Problem Relation Age of Onset  . Kidney failure Mother   . Heart attack Father   . Heart attack Sister   . Heart attack Brother    Social History:  reports that she quit smoking about 55 years ago. She does not have any smokeless tobacco history on file. She reports that she does not drink alcohol or use drugs.  Allergies:  Allergies  Allergen Reactions  . Codeine Other (See Comments)    unknown  . Iodine Other (See Comments)    Rash and swelling    Medications: Prior to Admission:  No current facility-administered medications for this encounter.    Current Outpatient Prescriptions  Medication Sig Dispense Refill  . aspirin EC 81 MG tablet Take 81 mg by mouth daily.    Marland Kitchen atenolol (TENORMIN) 25 MG tablet Take 1 tablet by mouth daily. Take 1 tablet every morning with 50mg  tab=75mg  *HOLD IF HR<60 or SBP <110    . atenolol (TENORMIN) 50 MG tablet Take 50 mg by mouth  daily before breakfast. Give with 25 mg to = 75 mg daily.  Hold if HR <60 or systolic BP <110    . azelastine (OPTIVAR) 0.05 % ophthalmic solution Place 1 drop into both eyes 2 (two) times daily.     . carboxymethylcellulose (LUBRICANT EYE DROPS) 0.5 % SOLN Place 1 drop into both eyes 4 (four) times daily.     . cholecalciferol (VITAMIN D) 1000 UNITS tablet Take 1,000 Units by mouth daily.    . Cranberry 450 MG TABS Take 1 tablet by mouth daily.    . hydrocerin (EUCERIN) CREA Apply 1 application topically daily.     Marland Kitchen latanoprost (XALATAN) 0.005 % ophthalmic solution Place 1 drop into both eyes at bedtime.     Marland Kitchen lisinopril (PRINIVIL,ZESTRIL) 20 MG tablet Take 1 tablet (20 mg total) by mouth  daily. Reported on 02/05/2016 30 tablet 0  . mirtazapine (REMERON) 15 MG tablet Take 15 mg by mouth at bedtime.    . NON FORMULARY Take 1 Can by mouth 2 (two) times daily. Mighty Shake    . QUEtiapine (SEROQUEL) 25 MG tablet Take 1 tablet (25 mg total) by mouth 2 (two) times daily. Reported on 02/05/2016 30 tablet 0  . Skin Protectants, Misc. (BAZA PROTECT EX) Apply 1 application topically as needed (each incontinence episode).    Marland Kitchen acetaminophen (TYLENOL) 500 MG tablet Take 500 mg by mouth every 4 (four) hours as needed for mild pain or headache (Not to exceed 4000 mg for fever up to 101.).    Marland Kitchen alum & mag hydroxide-simeth (MINTOX) 200-200-20 MG/5ML suspension Take 30 mLs by mouth every 6 (six) hours as needed for indigestion or heartburn (Not to exceed 4 doses in 24 hours).    Marland Kitchen aspirin EC 325 MG EC tablet Take 1 tablet (325 mg total) by mouth daily. (Patient not taking: Reported on 03/10/2016) 30 tablet 0  . clopidogrel (PLAVIX) 75 MG tablet Take 1 tablet (75 mg total) by mouth daily with breakfast. (Patient not taking: Reported on 03/23/2017)    . donepezil (ARICEPT) 10 MG tablet Take 1 tablet (10 mg total) by mouth every evening. Reported on 02/05/2016 (Patient not taking: Reported on 03/23/2017) 30 tablet 0  . lidocaine (LIDODERM) 5 % Place 1 patch onto the skin daily. Remove & Discard patch within 12 hours or as directed by MD (Patient not taking: Reported on 03/23/2017) 30 patch 0  . loperamide (IMODIUM) 2 MG capsule Take 2 mg by mouth as needed for diarrhea or loose stools (Not to exceed 8 doses in 24 hours).    . Menthol-Zinc Oxide (RISAMINE) 0.44-20.625 % OINT Apply 1 application topically daily as needed (Apply to sacrum and buttocks after each incontinent episode).    . pantoprazole (PROTONIX) 40 MG tablet Take 1 tablet (40 mg total) by mouth daily. Reported on 02/05/2016 (Patient not taking: Reported on 03/10/2016) 30 tablet 0  . traMADol (ULTRAM) 50 MG tablet Take 1 tablet (50 mg total) by mouth  every 6 (six) hours as needed for moderate pain. (Patient not taking: Reported on 03/23/2017) 30 tablet 0   Facility-Administered Medications Ordered in Other Encounters  Medication Dose Route Frequency Provider Last Rate Last Dose  . 0.45 % sodium chloride infusion   Intravenous Continuous Hillis Range, MD      . 0.9 %  sodium chloride infusion  250 mL Intravenous Continuous Hillis Range, MD      . chlorhexidine (HIBICLENS) 4 % liquid 4 application  60 mL Topical Once  Hillis Range, MD      . sodium chloride 0.9 % injection 3 mL  3 mL Intravenous Q12H Hillis Range, MD      . sodium chloride 0.9 % injection 3 mL  3 mL Intravenous PRN Hillis Range, MD        ROS: Unobtainable from the patient at this time. The patient's daughter who visits 3 times per week was not aware of any recent changes until today.  Physical Examination: Blood pressure (!) 145/118, pulse 65, temperature 97.3 F (36.3 C), temperature source Axillary, resp. rate (!) 21, SpO2 96 %.  General - 83 year old female with right neglect Heart - Irregularly irregular - no murmer Lungs - Clear to auscultation - poor inspiratory effort Abdomen - Soft - non tender Extremities - Distal pulses intact - no edema Skin - Warm and dry  Neurologic Examination:  Mental Status:  Expressively and receptively aphasic. She does not follow any commands. Right neglect. Cranial Nerves:  Eyes deviated to the left. Pinpoint pupils - sluggish. Motor: Moves all extremities spontaneously. Sensory: Responds to painful stimulus. Deep Tendon Reflexes: 1/4 throughout Plantars: Downgoing bilaterally  Cerebellar: Patient could not follow commands for testing. Gait: not tested   Laboratory Studies:  Basic Metabolic Panel:  Recent Labs Lab 03/23/17 1051 03/23/17 1056  NA 141 143  K 4.1 4.3  CL 110 107  CO2 25  --   GLUCOSE 104* 98  BUN 15 20  CREATININE 1.02* 1.00  CALCIUM 9.5  --     Liver Function Tests:  Recent Labs Lab  03/23/17 1051  AST 25  ALT 12*  ALKPHOS 109  BILITOT 1.0  PROT 6.7  ALBUMIN 3.7   No results for input(s): LIPASE, AMYLASE in the last 168 hours. No results for input(s): AMMONIA in the last 168 hours.  CBC:  Recent Labs Lab 03/23/17 1051 03/23/17 1056  WBC 6.2  --   NEUTROABS 4.3  --   HGB 14.6 15.6*  HCT 44.5 46.0  MCV 89.0  --   PLT 217  --     Cardiac Enzymes: No results for input(s): CKTOTAL, CKMB, CKMBINDEX, TROPONINI in the last 168 hours.  BNP: Invalid input(s): POCBNP  CBG: No results for input(s): GLUCAP in the last 168 hours.  Microbiology: Results for orders placed or performed during the hospital encounter of 02/05/16  MRSA PCR Screening     Status: None   Collection Time: 02/05/16 11:01 PM  Result Value Ref Range Status   MRSA by PCR NEGATIVE NEGATIVE Final    Comment:        The GeneXpert MRSA Assay (FDA approved for NASAL specimens only), is one component of a comprehensive MRSA colonization surveillance program. It is not intended to diagnose MRSA infection nor to guide or monitor treatment for MRSA infections.     Coagulation Studies:  Recent Labs  03/23/17 1051  LABPROT 15.5*  INR 1.23    Urinalysis:  Recent Labs Lab 03/23/17 1204  COLORURINE YELLOW  LABSPEC 1.012  PHURINE 7.0  GLUCOSEU NEGATIVE  HGBUR NEGATIVE  BILIRUBINUR NEGATIVE  KETONESUR NEGATIVE  PROTEINUR NEGATIVE  NITRITE NEGATIVE  LEUKOCYTESUR NEGATIVE    Lipid Panel:    Component Value Date/Time   CHOL 103 03/07/2013 0527   TRIG 90 03/07/2013 0527   HDL 35 (L) 03/07/2013 0527   CHOLHDL 2.9 03/07/2013 0527   VLDL 18 03/07/2013 0527   LDLCALC 50 03/07/2013 0527    HgbA1C:  Lab Results  Component Value Date  HGBA1C 5.1 03/07/2013    Urine Drug Screen:  No results found for: LABOPIA, COCAINSCRNUR, LABBENZ, AMPHETMU, THCU, LABBARB  Alcohol Level: No results for input(s): ETH in the last 168 hours.  Other results: EKG: Atrial fibrillation.  Ventricular response 62 bpm.  Imaging:  Dg Chest 2 View 03/23/2017 Mild enlargement of the cardiac silhouette without evidence of pulmonary vascular congestion or pulmonary edema. No acute pneumonia. Thoracic aortic atherosclerosis.   Ct Head Wo Contrast 03/23/2017 No acute intracranial abnormality. Atrophy, chronic small vessel disease.     Assessment: 83 y.o. female with a history of dementia, coronary artery disease, sick sinus syndrome. permanent pacemaker, permanent atrial fibrillation, and hypertension presents with aphasia and right neglect. The patient's daughter who accompanies the patient does not want an aggressive workup. The plan will be to repeat CT scan tomorrow as he patient has a permanent pacemaker.  Stroke Risk Factors - coronary artery disease, hypertension, atrial fibrillation not anticoagulated, and hypertension.  Plan:  HgbA1c, fasting lipid panel  PT consult, OT consult, Speech consult  Prophylactic therapy - aspirin suppository until swallowing status can be assessed.  NPO until RN stroke swallow screen  Telemetry monitoring  Frequent neuro checks  Repeat head CT tomorrow.  The family plans to meet to discuss care issues.    Assessment and plan to follow per Dr. Denny Peonster   David Rinehuls PA-C [facility] Pager [phone] 03/23/2017, 1:47 PM    Neurology Attending Addendum  This patient was seen, examined, and d/w PA. I have reviewed the note and agree with the findings, assessment and plan as documented with the following additions.   In brief, this is an 83 year old woman with history of severe dementia who now presents from her nursing facility after developing aphasia and a left gaze deviation. She was last known to be in her normal state of health when she went to bed at 1900 last night. [facility] for further evaluation. She was found to be globally aphasic with a left gaze  deviation and a right facial droop. CT scan of the head was obtained and reportedly showed no acute abnormality. Neurology consultation is now requested for further recommendations.  Her daughter is present at the bedside. She reports that at baseline, the patient is wheelchair dependent. She has profound dementia and requires assistance with basic activities of daily living. She is normally conversant. She is able to follow some commands but often requires reinforcement in order to do so. Her daughter reports that she would like to have her mother placed in a skilled nursing facility after discharge. Her daughter reports that she takes both aspirin and Plavix at baseline.  Exam: On my assessment, the patient is alert. She will attend to me when I speak to her from either side of the bed. She is not able to turn her head and her gaze to me when I stand on her right side. She no longer has a fixed left gaze deviation. She attempts to speak at times, but this is unintelligible, consistent with excessive aphasia and dysarthria. Did not follow any commands for me. She seems to have relatively decreased blink to threat from the right side but this is inconsistent. She has some flattening of the right nasolabial fold but grimace seems to be relatively symmetric. She moves all 4 extremities symmetrically and withdraws all 4 from noxious stimuli.  Imaging: I have personally and independently reviewed the CT scan of the head from  today. This shows severe diffuse generalized atrophy with prominent enlargement of the temporal horns of both lateral ventricles, suggesting focal hippocampal atrophy bilaterally. She has a severe burden of chronic small vessel ischemic change involving the bihemispheric white matter. There is a suggestion of loss of gray-white differentiation in the left MCA territory but given the background atrophy and small vessel disease it is difficult to fully characterize these findings. There is no  evidence of acute hemorrhage.  Pertinent labs: Urinalysis is negative CMP is notable for glucose 104 CBC is normal PT 15.5, INR 1.23 PTT 29  Impression: 1. Acute ischemic stroke. Presentation is most consistent with an acute left MCA territory infarct. I believe the CT scan of the head today shows some early ischemic changes but this scan is limited by the extent of chronic small vessel disease and atrophy. Unfortunately, the patient has a pacemaker and is not able to have MRI scan. Her daughter does not wish to pursue aggressive measures. The patient is not a good candidate for intervention so I think carotid Dopplers and echocardiogram could likely be deferred. 2. Global aphasia. This is acute, though it sounds as if the patient may have some receptive aphasia at baseline in the setting of her advanced dementia. Daughter reports that this is improving as she was initially nonverbal and is now attempting to speak. 3. Right facial droop. This is acute, due to stroke. This seems mild. 4. Dementia. She has what appears to be advanced dementia at baseline. I suspect this is a mixed dementia with contributions from both Alzheimer's disease and vascular disease given her CT scan appearance.  Recommendations: As per PA note. She is not a candidate for thrombolytic therapy as she is out of the window. She is not a candidate for thrombectomy due to her poor premorbid functional status. Will defer further workup given that no aggressive interventions are desired by patient's daughter. Supportive care. Repeat head CT in a.m. to see if we can further characterize extent of infarct. Aspirin PR until swallow evaluation.  Stroke team will assume care of the patient beginning 03/24/17. Please call with any urgent questions or concerns.

## 2017-03-23 NOTE — ED Triage Notes (Signed)
Pt presents via [facility] for evaluation of stroke like symptoms. [facility] Pt noted to go to bed last night around 1900 and was at baseline. Pt has dementia and is talkative but confused at baseline, notified that pt does not follow commands at baseline. Per [facility] pt noted to have L sided gaze  And R facial droop. Pt is wheelchair bound.

## 2017-03-23 NOTE — H&P (Signed)
- Date: 03/23/2017               Patient Name:  Alison Dodson MRN: [id number]  DOB: Sep 25, 1928 Age / Sex: 83 y.o., female   PCP: Alison Parker, MD              Medical Service: Internal Medicine Teaching Service              Attending Physician: Dr. Tyson Alias, MD    First Contact: Alison Dodson, MS4 Pager: [phone]  Second Contact: Dr. Sheliah Dodson Pager: [phone]  Third Contact Dr. N/A Pager: [phone]       After Hours (After 5p/  First Contact Pager: [phone]  weekends / holidays): Second Contact Pager: [phone]   Chief Complaint:  Stroke-like symptoms  History of Present Illness: Alison Dodson is an 83 year old female with a past medical history of dementia, coronary artery disease, sick sinus syndrome s/p pacemaker, permanent atrial fibrillation (not on anticoagulation), hypertension, and glaucoma who presented to the ED from a skilled nursing facility 5/3 due to stroke-like symptoms. She was in her usual state of health when she went to sleep at approximately 19:00 on 5/2. The morning of 5/3 around 7:00, the nursing facility staff noticed that she was having difficulty speaking and was not following commands. They also noted a left gaze deviation, right-sided facial droop and weakness in her right hand. Her daughter states that she has not had any recent symptoms of infection and has never experienced anything like this in the past. She has no history of prior TIA, stroke, seizure, MI, or blood clots.  Her daughter explains that she is wheelchair dependent at baseline due to a history of falls. She does have urinary and fecal incontinence. Her daughter states that her speech is sometimes unintelligible, but that she is verbal at baseline and is generally interactive and smiling. She states that she wants her mother to be "kept as comfortable as possible without any extensive or invasive testing."  Meds: Current Outpatient Prescriptions  Medication Sig Dispense Refill  .  aspirin EC 81 MG tablet Take 81 mg by mouth daily.    Marland Kitchen atenolol (TENORMIN) 25 MG tablet Take 1 tablet by mouth daily. Take 1 tablet every morning with 50mg  tab=75mg  *HOLD IF HR<60 or SBP <110    . atenolol (TENORMIN) 50 MG tablet Take 50 mg by mouth daily before breakfast. Give with 25 mg to = 75 mg daily.  Hold if HR <60 or systolic BP <110    . azelastine (OPTIVAR) 0.05 % ophthalmic solution Place 1 drop into both eyes 2 (two) times daily.     . carboxymethylcellulose (LUBRICANT EYE DROPS) 0.5 % SOLN Place 1 drop into both eyes 4 (four) times daily.     . cholecalciferol (VITAMIN D) 1000 UNITS tablet Take 1,000 Units by mouth daily.    . Cranberry 450 MG TABS Take 1 tablet by mouth daily.    . hydrocerin (EUCERIN) CREA Apply 1 application topically daily.     Marland Kitchen latanoprost (XALATAN) 0.005 % ophthalmic solution Place 1 drop into both eyes at bedtime.     Marland Kitchen lisinopril (PRINIVIL,ZESTRIL) 20 MG tablet Take 1 tablet (20 mg total) by mouth daily. Reported on 02/05/2016 30 tablet 0  . mirtazapine (REMERON) 15 MG tablet Take 15 mg by mouth at bedtime.    . NON FORMULARY Take 1 Can by mouth 2 (two) times daily. Mighty Shake    . QUEtiapine (SEROQUEL) 25 MG tablet Take  1 tablet (25 mg total) by mouth 2 (two) times daily. Reported on 02/05/2016 30 tablet 0  . Skin Protectants, Misc. (BAZA PROTECT EX) Apply 1 application topically as needed (each incontinence episode).    Marland Kitchen. acetaminophen (TYLENOL) 500 MG tablet Take 500 mg by mouth every 4 (four) hours as needed for mild pain or headache (Not to exceed 4000 mg for fever up to 101.).    Marland Kitchen. alum & mag hydroxide-simeth (MINTOX) 200-200-20 MG/5ML suspension Take 30 mLs by mouth every 6 (six) hours as needed for indigestion or heartburn (Not to exceed 4 doses in 24 hours).    Marland Kitchen. aspirin EC 325 MG EC tablet Take 1 tablet (325 mg total) by mouth daily. (Patient not taking: Reported on 03/10/2016) 30 tablet 0  . clopidogrel (PLAVIX) 75 MG tablet Take 1 tablet (75 mg  total) by mouth daily with breakfast. (Patient not taking: Reported on 03/23/2017)    . donepezil (ARICEPT) 10 MG tablet Take 1 tablet (10 mg total) by mouth every evening. Reported on 02/05/2016 (Patient not taking: Reported on 03/23/2017) 30 tablet 0  . loperamide (IMODIUM) 2 MG capsule Take 2 mg by mouth as needed for diarrhea or loose stools (Not to exceed 8 doses in 24 hours).    . Menthol-Zinc Oxide (RISAMINE) 0.44-20.625 % OINT Apply 1 application topically daily as needed (Apply to sacrum and buttocks after each incontinent episode).     Allergies: Allergies as of 03/23/2017 - Review Complete 03/23/2017  Allergen Reaction Noted  . Codeine Other (See Comments) 09/08/2010  . Iodine Other (See Comments) 09/08/2010   Past Medical History:  Diagnosis Date  . Anxiety   . Coronary artery disease   . Dementia   . Depression   . Dizziness   . Fall   . GERD (gastroesophageal reflux disease)   . Glaucoma   . HX: anticoagulation   . Hypertension   . Osteoarthritis   . Permanent atrial fibrillation (HCC)   . SSS (sick sinus syndrome) (HCC)   . Tachy-brady syndrome [facility](HCC)    Past Surgical History:  Procedure Laterality Date  . BREAST REDUCTION SURGERY    . CARDIOVASCULAR STRESS TEST  02/15/2007  . CHOLECYSTECTOMY    . GASTRIC BYPASS    . INSERT / REPLACE / REMOVE PACEMAKER  01/24/2008   DDD PACEMAKER IMPLANT  . PACEMAKER INSERTION    . PERMANENT PACEMAKER GENERATOR CHANGE N/A 01/29/2013   Procedure: PERMANENT PACEMAKER GENERATOR CHANGE;  Surgeon: Alison RangeJames Allred, MD;  [facility];  Service: Cardiovascular;  Laterality: N/A;  . TOTAL KNEE ARTHROPLASTY     right  . US ECHOCARDIOGRAPHY  08/10/2007   EF 60%  . US ECHOCARDIOGRAPHY  01/12/2007   EF 70%   Family History  Problem Relation Age of Onset  . Kidney failure Mother   . Heart attack Father   . Heart attack Sister   . Heart attack Brother    Social History   Social History  . Marital status: Widowed    Spouse name: N/A   . Number of children: 7  . Years of education: N/A  Daughter Darel Hong(Judy) and son share power of attorney.   Occupational History  . RETIRED Retired   Social History Main Topics  . Smoking status: Former Smoker    Quit date: 07/17/1961  . Smokeless tobacco: Not on file  . Alcohol use No  . Drug use: No  . Sexual activity: Not on file   Review of Systems: Unable to obtain  due to clinical condition  Physical Exam: Blood pressure (!) 153/87, pulse 63, temperature 97.3 F (36.3 C), temperature source Axillary, resp. rate (!) 21, SpO2 97 %. General: Frail-appearing female lying in bed with head turned toward left side. HEENT: Head normocephalic, atraumatic. Pupils 2mm, equal, round, reactive to light. No eye deviation noted on resting gaze. Dry mucus membranes. Cardiac: Irregularly irregular rhythm. No murmurs, rubs, or gallops. No carotid bruits auscultated. Respiratory: Clear to auscultation bilaterally. Normal work of breathing. Abdomen: Soft, non-distended. Extremities: Warm and dry. Radial and dorsalis pedis pulses 2+ bilaterally. Neurologic: Appears alert. Speaks a few unintelligible words, but largely aphasic. Right neglect. Difficulty following commands but does eventually squeeze examiner's fingers on right side. Moving all extremities.  Lab results: Basic Metabolic Panel:  Recent Labs  13/08/65 1051 03/23/17 1056  NA 141 143  K 4.1 4.3  CL 110 107  CO2 25  --   GLUCOSE 104* 98  BUN 15 20  CREATININE 1.02* 1.00  CALCIUM 9.5  --    Liver Function Tests:  Recent Labs  03/23/17 1051  AST 25  ALT 12*  ALKPHOS 109  BILITOT 1.0  PROT 6.7  ALBUMIN 3.7     CBC:  Recent Labs  03/23/17 1051 03/23/17 1056  WBC 6.2  --   NEUTROABS 4.3  --   HGB 14.6 15.6*  HCT 44.5 46.0  MCV 89.0  --   PLT 217  --    Coagulation:  Recent Labs  03/23/17 1051  LABPROT 15.5*  INR 1.23   Urinalysis:  Recent Labs  03/23/17 1204  COLORURINE YELLOW  LABSPEC 1.012   PHURINE 7.0  GLUCOSEU NEGATIVE  HGBUR NEGATIVE  BILIRUBINUR NEGATIVE  KETONESUR NEGATIVE  PROTEINUR NEGATIVE  NITRITE NEGATIVE  LEUKOCYTESUR NEGATIVE   Imaging results:  Dg Chest 2 View  Result Date: 03/23/2017 CLINICAL DATA:  Stroke symptoms. History of coronary artery disease, atrial fibrillation with tachy Brady syndrome, former smoker. EXAM: CHEST  2 VIEW COMPARISON:  Chest x-ray of February 05, 2016. FINDINGS: The lungs are adequately inflated. There is no focal infiltrate. There is no pleural effusion. The heart is mildly enlarged. The pulmonary vascularity is normal. There is calcification in the wall of the aortic arch. The ICD is in stable position. There are degenerative changes of both shoulders. IMPRESSION: Mild enlargement of the cardiac silhouette without evidence of pulmonary vascular congestion or pulmonary edema. No acute pneumonia. Thoracic aortic atherosclerosis. Electronically Signed   By: David  [place] M.D.   On: 03/23/2017 11:22   Ct Head Wo Contrast  Result Date: 03/23/2017 CLINICAL DATA:  Stroke-like symptoms EXAM: CT HEAD WITHOUT CONTRAST TECHNIQUE: Contiguous axial images were obtained from the base of the skull through the vertex without intravenous contrast. COMPARISON:  03/10/2016 FINDINGS: Brain: There is atrophy and chronic small vessel disease changes. No acute intracranial abnormality. Specifically, no hemorrhage, hydrocephalus, mass lesion, acute infarction, or significant intracranial injury. Old left cerebellar infarct, stable Vascular: No hyperdense vessel or unexpected calcification. Skull: No acute calvarial abnormality. Sinuses/Orbits: Visualized paranasal sinuses and mastoids clear. Orbital soft tissues unremarkable. Other: None IMPRESSION: No acute intracranial abnormality. Atrophy, chronic small vessel disease. Electronically Signed   By: Charlett Nose M.D.   On: 03/23/2017 11:30    Other results: EKG: Atrial fibrillation; ventricular rate of 62. Unchanged  from prior (02/06/16).  Problem List: -- Stroke-like symptoms -- Dementia -- Coronary artery disease -- Sick sinus syndrome s/p pacemaker -- Permanent atrial fibrillation (not on anticoagulation) -- Hypertension --  Glaucoma  Assessment & Plan by Problem: #Stroke-like symptoms Differential includes ischemic stroke (left MCA), post-ictal state following unwitnessed seizure, and systemic infection. Given risk factors including history of atrial fibrillation not on anticoagulation, coronary artery disease, and hypertension, as well as the acute onset of symptoms, ischemic stroke appears most likely at this time. Post-ictal state following unwitnessed seizure is also a possibility, although patient has no personal or family history of seizures and no metabolic/electrolyte abnormalities that would predispose her to a seizure. Systemic infection could also be a cause of her symptoms, although it is unlikely without leukocytosis and with a negative UA and CXR. Additionally, systemic infection would more likely present with confusion rather than an acute deficit such as right neglect, and this patient is relatively alert. Head CT w/o contrast showed atrophy with chronic small vessel disease without acute intracranial abnormality. Unable to obtain MRI due to pacemaker. Daughter does not want extensive work-up, including carotid Dopplers / echocardigram; would largely prefer supportive care. Granddaughter arriving tonight. Son who shares power of attorney with daughter arriving tomorrow. Other children live elsewhere. -- Fasting lipid panel -- Hgb A1c -- PT, OT, speech evaluation -- Full-dose (325mg  aspirin) prophylaxis  -- Stroke swallow screen -- Telemetry -- Monitor closely for worsening neurologic function -- Repeat head CT tomorrow -- Neurology / stroke team following  #Dementia Appears advanced at baseline. Daughter describes as Alzheimer's; likely component of vascular dementia as well given CT  scan. -- Palliative care consult in the morning to meet with family and discuss goals of care  #Coronary artery disease #Sick sinus syndrome s/p pacemaker #Permanent atrial fibrillation (not on anticoagulation) Based on records, patient and daughter have declined recommendation for anticoagulation numerous times in the past few years due to their concerns regarding her fall risk.  -- Discontinue home aspirin in lieu of full-dose aspirin -- Hold home clopidogrel  #Hypertension  -- Hold home atenolol -- Hold home lisinopril  #Glaucoma -- Continue home ketotifen drops -- Continue home lataoprost drops  #FENGI: NPO pending swallow study #DVT PPX: Lovenox subcut #Code Status: DNR #Dispo: Admitted to the Internal Medicine Teaching Service for further management; anticipate 1-2 midnight stay.  This is a Psychologist, occupational Note.  The care of the patient was discussed with Dr. Dimple Casey and the assessment and plan was formulated with their assistance.  Please see their note for official documentation of the patient encounter.   Signed: Raoul Dodson, Medical Student 03/23/2017, 3:24 PM

## 2017-03-23 NOTE — ED Notes (Signed)
Pt taken to Xray.

## 2017-03-23 NOTE — H&P (Signed)
Date: 03/23/2017               Patient Name:  Alison Dodson MRN: [id number]  DOB: 02-03-1928 Age / Sex: 83 y.o., female   PCP: Ron Parker, MD         Medical Service: Internal Medicine Teaching Service         Attending Physician: Dr. Tyson Alias, MD    First Contact: Dr. Raoul Pitch Pager: [phone]  Second Contact: Dr. Sheliah Hatch Pager: [phone]       After Hours (After 5p/  First Contact Pager: [phone]  weekends / holidays): Second Contact Pager: [phone]   Chief Complaint: Acute stroke  History of Present Illness: 83 year old woman with PMHx significant for alzheimer's dementia, CAD, permanent Afib not on anticoagulation, HTN, glaucoma, pulmonary HTN, sick sinus syndrome s/p PPM who presents from her SNF after acute neurological changes noticed around 7AM this morning with right sided facial droop, leftward gaze deviation, right hand weakness, and total aphasia. She was in normal health until yesterday evening last seen normal around 19:00 per her daughter. She has never suffered a stroke or focal neurological changes in the past. Head CT obtained in the ED was negative for obvious acute abnormalities. MRI was not ordered due to her permanent pacemaker. Neurology consultation recommended repeat head CT tomorrow for further assessment.  Previously she was functional at her baseline which was wheelchair bound and requiring total ADL assistance. She had frequent confusion due to dementia but fluent speech. She is incontinent of urine and stool. She has previously expressed advanced wishes for DNR status and her daughter is the primary HCPOA. She wishes for her comfort to be prioritized without extensive or invasive workup.  Note that history is obtained entirely through chart review, ED provider evaluation, and interview with patient's daughter due to the patient's clinical status.  Meds:  Current Meds  Medication Sig  . aspirin EC 81 MG tablet Take 81 mg by mouth  daily.  Marland Kitchen atenolol (TENORMIN) 25 MG tablet Take 1 tablet by mouth daily. Take 1 tablet every morning with 50mg  tab=75mg  *HOLD IF HR<60 or SBP <110  . atenolol (TENORMIN) 50 MG tablet Take 50 mg by mouth daily before breakfast. Give with 25 mg to = 75 mg daily.  Hold if HR <60 or systolic BP <110  . azelastine (OPTIVAR) 0.05 % ophthalmic solution Place 1 drop into both eyes 2 (two) times daily.   . carboxymethylcellulose (LUBRICANT EYE DROPS) 0.5 % SOLN Place 1 drop into both eyes 4 (four) times daily.   . cholecalciferol (VITAMIN D) 1000 UNITS tablet Take 1,000 Units by mouth daily.  . Cranberry 450 MG TABS Take 1 tablet by mouth daily.  . hydrocerin (EUCERIN) CREA Apply 1 application topically daily.   Marland Kitchen latanoprost (XALATAN) 0.005 % ophthalmic solution Place 1 drop into both eyes at bedtime.   Marland Kitchen lisinopril (PRINIVIL,ZESTRIL) 20 MG tablet Take 1 tablet (20 mg total) by mouth daily. Reported on 02/05/2016  . mirtazapine (REMERON) 15 MG tablet Take 15 mg by mouth at bedtime.  . NON FORMULARY Take 1 Can by mouth 2 (two) times daily. Mighty Shake  . QUEtiapine (SEROQUEL) 25 MG tablet Take 1 tablet (25 mg total) by mouth 2 (two) times daily. Reported on 02/05/2016  . Skin Protectants, Misc. (BAZA PROTECT EX) Apply 1 application topically as needed (each incontinence episode).     Allergies: Allergies as of 03/23/2017 - Review Complete 03/23/2017  Allergen  Reaction Noted  . Codeine Other (See Comments) 09/08/2010  . Iodine Other (See Comments) 09/08/2010   Past Medical History:  Diagnosis Date  . Anxiety   . Coronary artery disease   . Dementia   . Depression   . Dizziness   . Fall   . GERD (gastroesophageal reflux disease)   . Glaucoma   . HX: anticoagulation   . Hypertension   . Osteoarthritis   . Permanent atrial fibrillation (HCC)   . SSS (sick sinus syndrome) (HCC)   . Tachy-brady syndrome (HCC)     Family History: Mother: Renal failure      Father: MI      Sister:  MI  Social History: Living at SNF full assist for ADLs     Former remote smoker  Review of Systems: Review of Systems  Reason unable to perform ROS: clinical condition.    Physical Exam: Blood pressure (!) 165/90, pulse 63, temperature 97.4 F (36.3 C), temperature source Axillary, resp. rate 16, SpO2 99 %. GENERAL- Frail appearing elderly woman, attending but minimally interactive, no apparent distress HEENT- Atraumatic, PERRL, prominent lingual fissures CARDIAC- Irregular rhythm with a normal rate, no prominent murmurs, no carotid bruits RESP- CTAB, no wheezes or crackles NEURO- Not participatory with examination, nonverbal EXTREMITIES- Moderate muscular atrophy, only moving LUE spontaneously SKIN- Warm, dry, No rash or lesion PSYCH- Unable to assess   EKG: Atrial fibrillation with heart rate in 60s, T wave inversions across anterior leads consistent with old tracings  CXR: I personally reviewed the chest radiograph obtained in the ED today. This demonstrated no pulmonary edema or focal infiltrate. ICD is in position.  Assessment & Plan by Problem: #Stroke Presenting symptoms are tremendously suggestive of new ischemic stroke. She has a major risk factor in her chronic afib without anticoagulation. She is also limited mobility with being wheelchair bound and requiring assistance for transfer so thromboembolic disease is a possibility. At her daughter's request extensive workup is deferred at this time. We will pursue limited therapy and serological studies in the meantime with plan for repeat head CT. She will be held NPO at this time until passing bedside swallowing or formal swallow evaluation is possible.  This stroke may likely be a life limiting event especially if she will be unable to safely consume adequate nutrition especially given her low functional status beforehand.  Neurology assistance is appreciated with evaluating and managing this patient.  -Lipid, Hgb A1c -PT,  OT, speech evaluation -Full-dose (325mg  aspirin) prophylaxis  -Stroke swallow screen -Telemetry -Monitor closely for worsening neurologic function -Repeat head CT tomorrow  #Dementia Appears advanced at baseline. Daughter describes as Alzheimer's although probably multifactorial with a vascular component. Her functional status was low prior to this admission. -Consider palliative care consultation tomorrow to meet with family and discuss goals of care  #Coronary artery disease #Sick sinus syndrome s/p pacemaker #Permanent atrial fibrillation The patient and daughter have declined recommendation for anticoagulation in the past few years due to their concerns regarding her fall risk. She has a chads-vasc score of 5 prior to this CVA event (would be 7 if probably confirmed). She has been on dual anti-platelet therapy PTA. -Discontinue home aspirin in lieu of full-dose aspirin -Hold home clopidogrel  #Hypertension  Currently normotensive Plan for permissive hypertension after most likely acute ischemic stroke with goal <220/110  #Glaucoma Continue home ketotifen, latanoprost drops  #FENGI: NPO pending swallow study #DVT PPX: [place] Enoxaparin #Code Status: DNR  Dispo: Admit patient to inpatient for stroke  evaluation expected LOS 1-2 days  Signed: Fuller Planhristopher W Shareef Eddinger, MD PGY-II Internal Medicine Resident Pager# [phone] 03/23/2017, 8:10 PM

## 2017-03-23 NOTE — Progress Notes (Signed)
RECEIVED PT FROM ER IN NO OBVIOUS DISTRESS. ALERT WITH GLOBAL CONFUSION, UNABLE TO COOPERATE WITH EXAM FULLY. FAMILY AT BEDSIDE. PLACED ON MONITOR, V PACED 61. FAMILY STATES THAT PT MOOD/BEHAVIOR AND GENERAL STATE OF HEALTH IS MAJOR DEPARTURE FROM YESTERDAY.

## 2017-03-23 NOTE — ED Notes (Signed)
PA at the bedside.

## 2017-03-23 NOTE — ED Provider Notes (Signed)
[facility]-EMERGENCY DEPT Provider Note   CSN: [id number] Arrival date & time: 03/23/17  [id number]     History   Chief Complaint Chief Complaint  Patient presents with  . Weakness    HPI Alison Dodson is a 83 y.o. female.  HPI 83 year old Caucasian female past medical history significant for dementia, CAD, atrial fibrillation, sick sinus syndrome status post pacemaker that presents to the ED today by EMS from nursing facility for weakness and altered mental status. Nursing facility states that patient is demented at baseline but she is able to communicate. She is rather wheelchair and is smiling most of the time. States that she went to bed at 7:00 last night was normal. When she woke up this morning they noticed that she had a right facial droop, left-sided gaze, was nonverbal. Patient is a DO NOT RESUSCITATE. Level V caveat due to altered mental status. Nursing facility denies any infectious symptoms. Past Medical History:  Diagnosis Date  . Anxiety   . Coronary artery disease   . Dementia   . Depression   . Dizziness   . Fall   . GERD (gastroesophageal reflux disease)   . Glaucoma   . HX: anticoagulation   . Hypertension   . Osteoarthritis   . Permanent atrial fibrillation (HCC)   . SSS (sick sinus syndrome) (HCC)   . Tachy-brady syndrome [facility](HCC)     Patient Active Problem List   Diagnosis Date Noted  . Chronic atrial fibrillation (HCC)   . Essential hypertension   . Tricuspid valve regurgitation   . Pain in the chest   . Chest pain 02/05/2016  . Pacemaker 02/05/2016  . Acute pain of left shoulder 02/05/2016  . Volume depletion 02/05/2016  . Pulmonary HTN (HCC) 02/05/2016  . Moderate tricuspid regurgitation 02/05/2016  . CAD (coronary artery disease) 05/19/2013  . TIA (transient ischemic attack) 03/06/2013  . Alzheimer's dementia 07/28/2012  . UTI (lower urinary tract infection) 07/28/2012  . Dizzy 08/02/2011  . Prerenal azotemia 08/02/2011  . Essential hypertension,  benign 02/02/2011  . Atrial fibrillation (HCC) 02/02/2011  . BRADYCARDIA-TACHYCARDIA SYNDROME 02/02/2011    Past Surgical History:  Procedure Laterality Date  . BREAST REDUCTION SURGERY    . CARDIOVASCULAR STRESS TEST  02/15/2007  . CHOLECYSTECTOMY    . GASTRIC BYPASS    . INSERT / REPLACE / REMOVE PACEMAKER  01/24/2008   DDD PACEMAKER IMPLANT  . PACEMAKER INSERTION    . PERMANENT PACEMAKER GENERATOR CHANGE N/A 01/29/2013   Procedure: PERMANENT PACEMAKER GENERATOR CHANGE;  Surgeon: Hillis RangeJames Allred, MD;  [facility];  Service: Cardiovascular;  Laterality: N/A;  . TOTAL KNEE ARTHROPLASTY     right  . US ECHOCARDIOGRAPHY  08/10/2007   EF 60%  . US ECHOCARDIOGRAPHY  01/12/2007   EF 70%    OB History    No data available       Home Medications    Prior to Admission medications   Medication Sig Start Date End Date Taking? Authorizing Provider  aspirin EC 81 MG tablet Take 81 mg by mouth daily.   Yes Historical Provider, MD  atenolol (TENORMIN) 25 MG tablet Take 1 tablet by mouth daily. Take 1 tablet every morning with 50mg  tab=75mg  *HOLD IF HR<60 or SBP <110 02/24/16  Yes Historical Provider, MD  atenolol (TENORMIN) 50 MG tablet Take 50 mg by mouth daily before breakfast. Give with 25 mg to = 75 mg daily.  Hold if HR <60 or systolic BP <110   Yes  Historical Provider, MD  azelastine (OPTIVAR) 0.05 % ophthalmic solution Place 1 drop into both eyes 2 (two) times daily.    Yes Historical Provider, MD  carboxymethylcellulose (LUBRICANT EYE DROPS) 0.5 % SOLN Place 1 drop into both eyes 4 (four) times daily.    Yes Historical Provider, MD  cholecalciferol (VITAMIN D) 1000 UNITS tablet Take 1,000 Units by mouth daily.   Yes Historical Provider, MD  Cranberry 450 MG TABS Take 1 tablet by mouth daily.   Yes Historical Provider, MD  hydrocerin (EUCERIN) CREA Apply 1 application topically daily.    Yes Historical Provider, MD  latanoprost (XALATAN) 0.005 % ophthalmic solution Place 1 drop  into both eyes at bedtime.    Yes Historical Provider, MD  lisinopril (PRINIVIL,ZESTRIL) 20 MG tablet Take 1 tablet (20 mg total) by mouth daily. Reported on 02/05/2016 02/07/16  Yes Drema Dallas, MD  mirtazapine (REMERON) 15 MG tablet Take 15 mg by mouth at bedtime.   Yes Historical Provider, MD  NON FORMULARY Take 1 Can by mouth 2 (two) times daily. Mighty Shake   Yes Historical Provider, MD  QUEtiapine (SEROQUEL) 25 MG tablet Take 1 tablet (25 mg total) by mouth 2 (two) times daily. Reported on 02/05/2016 02/07/16  Yes Drema Dallas, MD  Skin Protectants, Misc. (BAZA PROTECT EX) Apply 1 application topically as needed (each incontinence episode).   Yes Historical Provider, MD  acetaminophen (TYLENOL) 500 MG tablet Take 500 mg by mouth every 4 (four) hours as needed for mild pain or headache (Not to exceed 4000 mg for fever up to 101.).    Historical Provider, MD  alum & mag hydroxide-simeth (MINTOX) 200-200-20 MG/5ML suspension Take 30 mLs by mouth every 6 (six) hours as needed for indigestion or heartburn (Not to exceed 4 doses in 24 hours).    Historical Provider, MD  aspirin EC 325 MG EC tablet Take 1 tablet (325 mg total) by mouth daily. Patient not taking: Reported on 03/10/2016 02/07/16   Drema Dallas, MD  clopidogrel (PLAVIX) 75 MG tablet Take 1 tablet (75 mg total) by mouth daily with breakfast. Patient not taking: Reported on 03/23/2017 03/08/13   Rhetta Mura, MD  donepezil (ARICEPT) 10 MG tablet Take 1 tablet (10 mg total) by mouth every evening. Reported on 02/05/2016 Patient not taking: Reported on 03/23/2017 02/07/16   Drema Dallas, MD  lidocaine (LIDODERM) 5 % Place 1 patch onto the skin daily. Remove & Discard patch within 12 hours or as directed by MD Patient not taking: Reported on 03/23/2017 02/07/16   Drema Dallas, MD  loperamide (IMODIUM) 2 MG capsule Take 2 mg by mouth as needed for diarrhea or loose stools (Not to exceed 8 doses in 24 hours).    Historical Provider, MD    Menthol-Zinc Oxide (RISAMINE) 0.44-20.625 % OINT Apply 1 application topically daily as needed (Apply to sacrum and buttocks after each incontinent episode).    Historical Provider, MD  pantoprazole (PROTONIX) 40 MG tablet Take 1 tablet (40 mg total) by mouth daily. Reported on 02/05/2016 Patient not taking: Reported on 03/10/2016 02/07/16   Drema Dallas, MD  traMADol (ULTRAM) 50 MG tablet Take 1 tablet (50 mg total) by mouth every 6 (six) hours as needed for moderate pain. Patient not taking: Reported on 03/23/2017 02/07/16   Drema Dallas, MD    Family History Family History  Problem Relation Age of Onset  . Kidney failure Mother   . Heart attack Father   .  Heart attack Sister   . Heart attack Brother     Social History Social History  Substance Use Topics  . Smoking status: Former Smoker    Quit date: 07/17/1961  . Smokeless tobacco: Not on file  . Alcohol use No     Allergies   Codeine and Iodine   Review of Systems Review of Systems  Unable to perform ROS: Mental status change     Physical Exam Updated Vital Signs BP (!) 163/97   Pulse 72   Temp 97.3 F (36.3 C) (Axillary)   Resp 17   SpO2 95%   Physical Exam  Constitutional: She appears well-developed and well-nourished. No distress.  Frail elderly lady that is nonverbal. Response to verbal stimuli.  HENT:  Head: Normocephalic and atraumatic.  Mouth/Throat: Oropharynx is clear and moist.  Eyes: Conjunctivae are normal. Pupils are equal, round, and reactive to light. Right eye exhibits no discharge. Left eye exhibits no discharge. No scleral icterus.  Left lateral gaze. Does not track with extraocular movement. No nystagmus is noted.  Neck: Normal range of motion. Neck supple. No thyromegaly present.  Cardiovascular: Normal rate, normal heart sounds and intact distal pulses.  An irregularly irregular rhythm present. Exam reveals no gallop and no friction rub.   No murmur heard. Pulmonary/Chest: Effort  normal. No respiratory distress. She has no wheezes. She exhibits no tenderness.  Pain crackles noted.  Abdominal: Soft. Bowel sounds are normal. She exhibits no distension. There is no tenderness. There is no guarding.  Musculoskeletal: Normal range of motion.  Lymphadenopathy:    She has no cervical adenopathy.  Neurological: She is alert.  Patient is not oriented at baseline. She does have right-sided facial droop. The patient does not follow commands well. At best patient has 2+ grip strength. She's got pronator drift bilaterally. Unable to perform lower extremity strength testing. Dysmetria noted.  Skin: Skin is warm and dry.  Nursing note and vitals reviewed.    ED Treatments / Results  Labs (all labs ordered are listed, but only abnormal results are displayed) Labs Reviewed  PROTIME-INR - Abnormal; Notable for the following:       Result Value   Prothrombin Time 15.5 (*)    All other components within normal limits  COMPREHENSIVE METABOLIC PANEL - Abnormal; Notable for the following:    Glucose, Bld 104 (*)    Creatinine, Ser 1.02 (*)    ALT 12 (*)    GFR calc non Af Amer 48 (*)    GFR calc Af Amer 56 (*)    All other components within normal limits  I-STAT CHEM 8, ED - Abnormal; Notable for the following:    Hemoglobin 15.6 (*)    All other components within normal limits  APTT  CBC  DIFFERENTIAL  URINALYSIS, ROUTINE W REFLEX MICROSCOPIC  I-STAT TROPOININ, ED    EKG  EKG Interpretation  Date/Time:  Thursday Mar 23 2017 10:57:33 EDT Ventricular Rate:  62 PR Interval:    QRS Duration: 97 QT Interval:  456 QTC Calculation: 471 R Axis:   -26 Text Interpretation:  Atrial fibrillation Borderline left axis deviation Low voltage, precordial leads Abnormal R-wave progression, early transition Abnormal T, consider ischemia, diffuse leads Confirmed by Juleen China  MD, STEPHEN [phone]) on 03/23/2017 1:09:15 PM       Radiology Dg Chest 2 View  Result Date: 03/23/2017 CLINICAL  DATA:  Stroke symptoms. History of coronary artery disease, atrial fibrillation with tachy Brady syndrome, former smoker. EXAM: CHEST  2 VIEW COMPARISON:  Chest x-ray of February 05, 2016. FINDINGS: The lungs are adequately inflated. There is no focal infiltrate. There is no pleural effusion. The heart is mildly enlarged. The pulmonary vascularity is normal. There is calcification in the wall of the aortic arch. The ICD is in stable position. There are degenerative changes of both shoulders. IMPRESSION: Mild enlargement of the cardiac silhouette without evidence of pulmonary vascular congestion or pulmonary edema. No acute pneumonia. Thoracic aortic atherosclerosis. Electronically Signed   By: David  [place] M.D.   On: 03/23/2017 11:22   Ct Head Wo Contrast  Result Date: 03/23/2017 CLINICAL DATA:  Stroke-like symptoms EXAM: CT HEAD WITHOUT CONTRAST TECHNIQUE: Contiguous axial images were obtained from the base of the skull through the vertex without intravenous contrast. COMPARISON:  03/10/2016 FINDINGS: Brain: There is atrophy and chronic small vessel disease changes. No acute intracranial abnormality. Specifically, no hemorrhage, hydrocephalus, mass lesion, acute infarction, or significant intracranial injury. Old left cerebellar infarct, stable Vascular: No hyperdense vessel or unexpected calcification. Skull: No acute calvarial abnormality. Sinuses/Orbits: Visualized paranasal sinuses and mastoids clear. Orbital soft tissues unremarkable. Other: None IMPRESSION: No acute intracranial abnormality. Atrophy, chronic small vessel disease. Electronically Signed   By: Charlett Nose M.D.   On: 03/23/2017 11:30    Procedures Procedures (including critical care time)  Medications Ordered in ED Medications - No data to display   Initial Impression / Assessment and Plan / ED Course  I have reviewed the triage vital signs and the nursing notes.  Pertinent labs & imaging results that were available during my care  of the patient were reviewed by me and considered in my medical decision making (see chart for details).     The patient resents to the ED from nursing facility by EMS for strokelike symptoms. Patient has left lateral gaze, right facial droop, nonverbal. Unable to obtain accurate neuro exam due to altered mental status. Patient was last seen normal at 7:00 last night. The patient's baseline is dementia however she is verbal and able to get around by wheelchair. Given the patient was outside of the stroke window no code stroke was initiated. Head CT showed no intracranial abnormalities. Chest x-ray shows no signs of focal infiltrate. Urine shows no signs of infection. Doubt altered mental status from infection. All other labs seem to be at baseline and unremarkable. Spoke with Dr.Oster with neurology concerning patient who states he will see patient in the ED recommended MRI follow-up and admission to medicine. Patient is unable to have MRI due to permanent pacemaker. Neurology's plan is to repeat CAT scan in the morning. Patient's daughter is okay with this plan. I spoke with internal medicine teaching service who agrees to admit patient will come to the ED to evaluate patient in place admission orders. The patient's daughter was updated on plan of care. Were answered. Patient remains hemodynamically stable. Mental status has not changed she still has left lateral gaze with right facial droop and is nonverbal. Patient was discussed with Dr. Juleen China who is agreeable to above plan.  Final Clinical Impressions(s) / ED Diagnoses   Final diagnoses:  Stroke-like symptoms  Weakness    New Prescriptions New Prescriptions   No medications on file     Rise Mu, PA-C 03/23/17 1354    Raeford Razor, MD 03/26/17 1506

## 2017-03-24 ENCOUNTER — Inpatient Hospital Stay (HOSPITAL_COMMUNITY): Payer: Medicare Other

## 2017-03-24 DIAGNOSIS — Z7982 Long term (current) use of aspirin: Secondary | ICD-10-CM

## 2017-03-24 DIAGNOSIS — I6300 Cerebral infarction due to thrombosis of unspecified precerebral artery: Secondary | ICD-10-CM

## 2017-03-24 DIAGNOSIS — I638 Other cerebral infarction: Secondary | ICD-10-CM

## 2017-03-24 LAB — LIPID PANEL
CHOL/HDL RATIO: 3.5 ratio
Cholesterol: 122 mg/dL (ref 0–200)
HDL: 35 mg/dL — AB (ref >40)
LDL CALC: 73 mg/dL (ref 0–99)
TRIGLYCERIDES: 72 mg/dL (ref <150)
VLDL: 14 mg/dL (ref 0–40)

## 2017-03-24 NOTE — Evaluation (Signed)
Clinical/Bedside Swallow Evaluation Patient Details  Name: Alison Dodson MRN: [id number] Date of Birth: June 10, 1928  Today's Date: 03/24/2017 Time: SLP Start Time (ACUTE ONLY): 1226 SLP Stop Time (ACUTE ONLY): 1240 SLP Time Calculation (min) (ACUTE ONLY): 14 min  Past Medical History:  Past Medical History:  Diagnosis Date  . Anxiety   . Coronary artery disease   . Dementia   . Depression   . Dizziness   . Fall   . GERD (gastroesophageal reflux disease)   . Glaucoma   . HX: anticoagulation   . Hypertension   . Osteoarthritis   . Permanent atrial fibrillation (HCC)   . SSS (sick sinus syndrome) (HCC)   . Tachy-brady syndrome [facility])    Past Surgical History:  Past Surgical History:  Procedure Laterality Date  . BREAST REDUCTION SURGERY    . CARDIOVASCULAR STRESS TEST  02/15/2007  . CHOLECYSTECTOMY    . GASTRIC BYPASS    . INSERT / REPLACE / REMOVE PACEMAKER  01/24/2008   DDD PACEMAKER IMPLANT  . PACEMAKER INSERTION    . PERMANENT PACEMAKER GENERATOR CHANGE N/A 01/29/2013   Procedure: PERMANENT PACEMAKER GENERATOR CHANGE;  Surgeon: Hillis Range, MD;  Location: [facility] CATH LAB;  Service: Cardiovascular;  Laterality: N/A;  . TOTAL KNEE ARTHROPLASTY     right  . US ECHOCARDIOGRAPHY  08/10/2007   EF 60%  . US ECHOCARDIOGRAPHY  01/12/2007   EF 70%   HPI:  83 year old woman with PMHx significant for alzheimer's dementia, CAD, permanent Afib not on anticoagulation, HTN, glaucoma, pulmonary HTN, sick sinus syndrome s/p PPM who presents from her SNF after acute neurological changes .  CT Focal hypodensity within the left posterior frontal lobe with gyral edema.    Assessment / Plan / Recommendation Clinical Impression  Pt presents with remarkably functional swallow despite advanced dementia. She demonstrated adequate recognition of and attention to various consistencies; appropriately used straw and masticated regular solids.  Swallow response was brisk, and there were no overt s/s of  aspiration.  Recommend allowing a regular diet with thin liquids.  Please assist with meals, providing full supervision, but allow pt to feed herself as much as she is able.  No further SLP f/u is warranted.  No formalized speech/language evaluation is warranted given advanced dementia.  Our services will respectfully sign off.  SLP Visit Diagnosis: Dysphagia, unspecified (R13.10)    Aspiration Risk  Mild aspiration risk    Diet Recommendation     Medication Administration: Whole meds with puree    Other  Recommendations Oral Care Recommendations: Oral care BID   Follow up Recommendations None      Frequency and Duration            Prognosis        Swallow Study   General Date of Onset: 03/23/17 HPI: 83 year old woman with PMHx significant for alzheimer's dementia, CAD, permanent Afib not on anticoagulation, HTN, glaucoma, pulmonary HTN, sick sinus syndrome s/p PPM who presents from her SNF after acute neurological changes .  CT Focal hypodensity within the left posterior frontal lobe with gyral edema.  Type of Study: Bedside Swallow Evaluation Previous Swallow Assessment: no Diet Prior to this Study: NPO Temperature Spikes Noted: No Respiratory Status: Room air History of Recent Intubation: No Behavior/Cognition: Alert;Confused Oral Cavity Assessment: Within Functional Limits Oral Care Completed by SLP: Recent completion by staff Oral Cavity - Dentition: Adequate natural dentition Vision: Functional for self-feeding Self-Feeding Abilities: Able to feed self;Needs assist Patient Positioning:  Upright in bed Baseline Vocal Quality: Normal Volitional Cough: Cognitively unable to elicit Volitional Swallow: Unable to elicit    Oral/Motor/Sensory Function Overall Oral Motor/Sensory Function: Within functional limits   Ice Chips Ice chips: Within functional limits   Thin Liquid Thin Liquid: Within functional limits    Nectar Thick Nectar Thick Liquid: Not tested   Honey  Thick Honey Thick Liquid: Not tested   Puree Puree: Within functional limits   Solid   GO   Solid: Within functional limits        Blenda MountsCouture, Patrick Salemi Laurice 03/24/2017,12:46 PM

## 2017-03-24 NOTE — Progress Notes (Signed)
OT Cancellation Note  Patient Details Name: Alison Dodson MRN: [id number] DOB: 04/24/1928   Cancelled Treatment:    Reason Eval/Treat Not Completed: Medical issues which prohibited therapy. Pt currently with bed rest orders in place. OT will continue to f/u with pt for evaluation as activity orders are updated.  Evern BioLaura J Naven Giambalvo 03/24/2017, 8:35 AM  Sherryl MangesLaura Vlad Mayberry OTR/L [phone]

## 2017-03-24 NOTE — Progress Notes (Signed)
STROKE TEAM PROGRESS NOTE   SUBJECTIVE (INTERVAL HISTORY) She remains aphasic. There is no family at the bedside.   OBJECTIVE Temp:  [96.7 F (35.9 C)-98.7 F (37.1 C)] 98.7 F (37.1 C) (05/04 1320) Pulse Rate:  [60-72] 66 (05/04 1320) Cardiac Rhythm: Ventricular paced;Bundle branch block (05/04 0700) Resp:  [16-18] 18 (05/04 1320) BP: (119-169)/(76-111) 169/111 (05/04 1320) SpO2:  [93 %-99 %] 95 % (05/04 1320)  CBC:  Recent Labs Lab 03/23/17 1051 03/23/17 1056  WBC 6.2  --   NEUTROABS 4.3  --   HGB 14.6 15.6*  HCT 44.5 46.0  MCV 89.0  --   PLT 217  --     Basic Metabolic Panel:  Recent Labs Lab 03/23/17 1051 03/23/17 1056  NA 141 143  K 4.1 4.3  CL 110 107  CO2 25  --   GLUCOSE 104* 98  BUN 15 20  CREATININE 1.02* 1.00  CALCIUM 9.5  --     Lipid Panel:    Component Value Date/Time   CHOL 122 03/24/2017 0447   TRIG 72 03/24/2017 0447   HDL 35 (L) 03/24/2017 0447   CHOLHDL 3.5 03/24/2017 0447   VLDL 14 03/24/2017 0447   LDLCALC 73 03/24/2017 0447   HgbA1c:  Lab Results  Component Value Date   HGBA1C 5.1 03/07/2013   Urine Drug Screen: No results found for: LABOPIA, COCAINSCRNUR, LABBENZ, AMPHETMU, THCU, LABBARB  Alcohol Level No results found for: ETH  PHYSICAL EXAM Frail elderly lady currently not in distress. . Afebrile. Head is nontraumatic. Neck is supple without bruit.    Cardiac exam no murmur or gallop. Lungs are clear to auscultation. Distal pulses are well felt. Neurological Exam ';  Awake alert. Follows only occasional midline commands. Nonfluent speech and speaks only a few words and very hesitant. Both expressive and receptive aphasia. Eyes are in primary position. Decreased blink to threat on the right compared to the left. Fundi were not visualized. Right lower facial asymmetry. Tongue midline. Motor system exam able to move all 4 extremities well against gravity. Deep tendon reflexes are depressed. Plantars are both downgoing. Gait was  not tested. ASSESSMENT/PLAN Alison Dodson is a 83 y.o. female with history of dementia, coronary artery disease, sick sinus syndrome with a permanent pacemaker, permanent atrial fibrillation, and hypertension presenting with facial droop. She did not receive IV t-PA.   Stroke:   L frontal infarct embolic secondary to kno atrial fibrillation not on anticoagulation   Resultant  facial droop resolved  CT head 5/3 . No acute abnormality. Atrophy. Small vessel disease.  CT head 5/4 L posterior frontal lobe cortical and periventricular white matter infarct. No hemorrhage. Atrophy. Small vessel disease.  LDL 73  HgbA1c pending  Lovenox 40 mg sq daily for VTE prophylaxis  Diet regular Room service appropriate? Yes; Fluid consistency: Thin  aspirin 325 mg daily and clopidogrel 75 mg daily prior to admission, now on aspirin 325 mg daily. Plans to resume plavix per attending note. AC recommended  Family do not want aggressive workup. Daughter was agreeable only to repeat CT head  Therapy recommendations:  pending   Disposition:  pending  (From skilled nursing facility)  Atrial Fibrillation  Home anticoagulation:  none   CHA2DS2-VASc Score = at least 7, ?2 oral anticoagulation recommended  Age in Years:  ?[age]   +2    Sex:  Female   Female   +1    Hypertension History:  +1     Diabetes Mellitus:  0   Congestive Heart Failure History:  0  Vascular Disease History:  +1     Stroke/TIA/Thromboembolism History:    +2  Anticoagulation recommended  Aspirin as agree poor anticoagulation candidate given his profound dementia and poor quality of life at baseline and family's wishes     Hypertension  Elevated  Permissive hypertension (OK if < 220/120) but gradually normalize in 5-7 days Long-term BP goal normotensive  Hyperlipidemia  Home meds:  No statin  LDL 73 will not add statin given poor quality of life and family does not want to be aggressive     Other Stroke Risk  Factors  Advanced age  Coronary artery disease  Other Active Problems  Sick sinus syndrome status post pacemaker  Hospital day # 1  I have personally examined this patient, reviewed notes, independently viewed imaging studies, participated in medical decision making and plan of care.ROS completed by me personally and pertinent positives fully documented  I have made any additions or clarifications directly to the above note.  She has presented with aphasia due to left MCA branch infarct of embolic etiology from atrial fibrillation. She has not been on anticoagulation at baseline due to profound dementia and being wheelchair dependent and poor quality of life. Daughter has expressed desire not to be aggressive with stroke workup and treatment. Agree with continuing at antiplatelet therapy alone and not anticoagulation. Consider DO NOT RESUSCITATE and palliative care given her poor baseline quality of life which is now going to be even worse from the deficits from the stroke. No family available at bedside for discussion. Greater than 50% time during this 25 minute visit was spent on coordination of care about aphasia, stroke  Delia Heady, MD Medical Director [facility] Pager: [phone] 03/24/2017 4:30 PM   To contact Stroke Continuity provider, please refer to [email or web address]. After hours, contact General Neurology

## 2017-03-24 NOTE — Progress Notes (Signed)
   Subjective: [name] is more alert this morning raising her head spontaneously and attending to people in the room. She makes several unintelligible vocalizations but is not following commands for staff or family. She remains NPO at this time pending swallow evaluation.  Objective:  Vital signs in last 24 hours: Vitals:   03/24/17 0000 03/24/17 0200 03/24/17 0400 03/24/17 0932  BP: 119/76 (!) 157/94 (!) 148/89 (!) 162/96  Pulse:    72  Resp:    18  Temp: 97 F (36.1 C)   97 F (36.1 C)  TempSrc: Axillary   Axillary  SpO2: 93% 94%  94%   GENERAL- alert, elderly woman lying in no apparent distress HEENT- slight facial droop but has bilateral smile CARDIAC- Irregular rhythm with normal rate RESP- CTAB on anterior lung fields examined in supine position ABDOMEN- Soft, no guarding or rebound NEURO- Assessment very limited due to following no commands, she is moving her head and neck spontaneously attending preferentially to her left side EXTREMITIES- symmetric, no pedal edema, senile purpura with friable skin  Assessment/Plan:  #Acute Ischemic Stroke New hypodensity changes in left posterior frontal lobe were demonstrated on head CT likely representing acute ischemic stroke. This is most likely embolic secondary to her chronic atrial fibrillation for years without anticoagulation. Starting anticoagulation would be the greatest intervention to reduce her risk of a repeat event in the future. Her daughter is very concerning about extensive bruising and head lacerations in the past as a cause to avoid anticoagulation but we again recommended this option today. She plans to talk about this with her brother. Meanwhile limited workup is unremarkable. She is still pending formal therapist evaluation. Due to her limited following commands and low baseline functional status the most important question is tolerating safe PO intake. Neurology service assistance is appreciated with evaluating and  managing this patient. -Hgb A1c -PT, OT, speech evaluation -Full-dose (325mg  aspirin) prophylaxis  -Stroke swallow screen -Telemetry -Monitor closely for worsening neurologic function  #Coronary artery disease #Sick sinus syndrome s/p pacemaker #Permanent atrial fibrillation The patient and daughter have declined recommendation for anticoagulation in the past few years due to their concerns regarding her fall risk. She has a chads-vasc score of 5 prior to this CVA event (would be 7 if probably confirmed). She has been on dual anti-platelet therapy PTA. -Discontinue home aspirin in lieu of full-dose aspirin -Hold home clopidogrel  #Hypertension Currently normotensive Plan for permissive hypertension after most likely acute ischemic stroke with goal <220/110  Dispo: Anticipated discharge pending further work up and therapy evaluations. Possibly to SNF within 1-3 days   Alison Dodson Planhristopher W Skyelar Swigart, MD PGY-II Internal Medicine Resident Pager# [phone] 03/24/2017, 12:35 PM

## 2017-03-24 NOTE — Progress Notes (Signed)
Patient's daughter refused lovenox. Patient's family agreeable to SCDs.Dr. Nelson ChimesAmin notified. SCDs ordered and placed.

## 2017-03-24 NOTE — Progress Notes (Signed)
Patient assisted to recliner. Required 2 assist to stedy. Tolerated well. Chair alarm on

## 2017-03-24 NOTE — Progress Notes (Signed)
Internal Medicine Attending:   I saw and examined the patient. I reviewed the resident's note and I agree with the resident's findings and plan as documented in the resident's note.  83 year old woman hospital day #2 with a likely acute CVA of the left MCA distribution. Repeat CT head showed hypodensity in the left frontal cortex. No MRI because of pacemaker. Functionally the patient is doing better today, no further facial droop. Speech has cleared her for a regular diet. Is going to need a skilled nursing facility placement. Stroke team following. Currently on aspirin, can resume Plavix now that she is taking oral medications. I encouraged the daughter to reconsider full anticoagulation given her underlying atrial fibrillation. I think the risk of her easy bruising is a relatively minor problem, and it seems the best way to prevent future strokes would be with anticoagulation in her case.

## 2017-03-24 NOTE — Progress Notes (Signed)
PT Cancellation Note  Patient Details Name: Alison Dodson MRN: [id number] DOB: 10/06/1928   Cancelled Treatment:    Reason Eval/Treat Not Completed: Medical issues which prohibited therapy. Pt currently with bed rest orders in place. PT will continue to f/u with pt for evaluation as activity orders are updated.   Alessandra BevelsJennifer M Apollonia Amini 03/24/2017, 8:32 AM

## 2017-03-24 NOTE — Progress Notes (Signed)
OT Cancellation Note  Patient Details Name: Verna CzechBillie S Kapuscinski MRN: [id number] DOB: 02-13-1928   Cancelled Treatment:    Reason Eval/Treat Not Completed: Medical issues which prohibited therapy. Pt continuesto have bed rest orders in place. OT will continue to f/u with pt for evaluation as activity orders are updated.  Evern BioLaura J Alvita Fana 03/24/2017, 3:38 PM  Sherryl MangesLaura Victorine Mcnee OTR/L [phone]

## 2017-03-25 ENCOUNTER — Encounter (HOSPITAL_COMMUNITY): Payer: Self-pay | Admitting: General Practice

## 2017-03-25 DIAGNOSIS — F0280 Dementia in other diseases classified elsewhere without behavioral disturbance: Secondary | ICD-10-CM

## 2017-03-25 DIAGNOSIS — I482 Chronic atrial fibrillation: Secondary | ICD-10-CM

## 2017-03-25 DIAGNOSIS — I495 Sick sinus syndrome: Secondary | ICD-10-CM

## 2017-03-25 DIAGNOSIS — I10 Essential (primary) hypertension: Secondary | ICD-10-CM

## 2017-03-25 DIAGNOSIS — I63412 Cerebral infarction due to embolism of left middle cerebral artery: Principal | ICD-10-CM

## 2017-03-25 DIAGNOSIS — G301 Alzheimer's disease with late onset: Secondary | ICD-10-CM

## 2017-03-25 LAB — HEMOGLOBIN A1C
Hgb A1c MFr Bld: 5.3 % (ref 4.8–5.6)
MEAN PLASMA GLUCOSE: 105 mg/dL

## 2017-03-25 MED ORDER — LOPERAMIDE HCL 2 MG PO CAPS: 2.0000 mg | ORAL_CAPSULE | ORAL | Status: DC | PRN

## 2017-03-25 MED ORDER — ACETAMINOPHEN 500 MG PO TABS: 500.0000 mg | ORAL_TABLET | ORAL | Status: DC | PRN

## 2017-03-25 MED ORDER — QUETIAPINE FUMARATE 25 MG PO TABS
25.0000 mg | ORAL_TABLET | Freq: Two times a day (BID) | ORAL | Status: DC
  Administered 2017-03-25 – 2017-03-28 (×7): 25 mg via ORAL
  Filled 2017-03-25 (×7): qty 1

## 2017-03-25 MED ORDER — ASPIRIN EC 325 MG PO TBEC
325.0000 mg | DELAYED_RELEASE_TABLET | Freq: Every day | ORAL | Status: DC
  Administered 2017-03-26 – 2017-03-27 (×2): 325 mg via ORAL
  Filled 2017-03-25 (×2): qty 1

## 2017-03-25 MED ORDER — CLOPIDOGREL BISULFATE 75 MG PO TABS
75.0000 mg | ORAL_TABLET | Freq: Every day | ORAL | Status: DC
  Administered 2017-03-26 – 2017-03-27 (×2): 75 mg via ORAL
  Filled 2017-03-25 (×2): qty 1

## 2017-03-25 MED ORDER — BAZA PROTECT EX CREA: TOPICAL_CREAM | CUTANEOUS | Status: DC | PRN

## 2017-03-25 MED ORDER — WHITE PETROLATUM GEL
Status: AC
  Administered 2017-03-25: 12:00:00
  Filled 2017-03-25: qty 1

## 2017-03-25 MED ORDER — MIRTAZAPINE 15 MG PO TABS
15.0000 mg | ORAL_TABLET | Freq: Every day | ORAL | Status: DC
  Administered 2017-03-25 – 2017-03-27 (×3): 15 mg via ORAL
  Filled 2017-03-25 (×3): qty 1

## 2017-03-25 NOTE — Progress Notes (Signed)
Pt eating breakfast with NT in room. Pt started to choke on food. NT and PT able to sweep pt throat and retrieve un chewed banana from throat. Pt able to cough the entire time and is resting comfortably. Pt also noted to consistently be coughing on thin liquids. Contacted ST and downgraded diet to D2. ST will come to see shortly.

## 2017-03-25 NOTE — Progress Notes (Signed)
STROKE TEAM PROGRESS NOTE   SUBJECTIVE (INTERVAL HISTORY) Daughter is at bedside. She remains globally aphasic, mild right sided weakness. Discussed with daughter, no aggressive stroke work up or treatment. She agreed.    OBJECTIVE Temp:  [97.5 F (36.4 C)-98.7 F (37.1 C)] 97.7 F (36.5 C) (05/05 0617) Pulse Rate:  [63-75] 63 (05/05 0617) Cardiac Rhythm: Ventricular paced (05/05 0831) Resp:  [18] 18 (05/05 0617) BP: (140-177)/(69-137) 140/69 (05/05 0617) SpO2:  [95 %-99 %] 96 % (05/05 0617)  CBC:   Recent Labs Lab 03/23/17 1051 03/23/17 1056  WBC 6.2  --   NEUTROABS 4.3  --   HGB 14.6 15.6*  HCT 44.5 46.0  MCV 89.0  --   PLT 217  --     Basic Metabolic Panel:   Recent Labs Lab 03/23/17 1051 03/23/17 1056  NA 141 143  K 4.1 4.3  CL 110 107  CO2 25  --   GLUCOSE 104* 98  BUN 15 20  CREATININE 1.02* 1.00  CALCIUM 9.5  --     Lipid Panel:     Component Value Date/Time   CHOL 122 03/24/2017 0447   TRIG 72 03/24/2017 0447   HDL 35 (L) 03/24/2017 0447   CHOLHDL 3.5 03/24/2017 0447   VLDL 14 03/24/2017 0447   LDLCALC 73 03/24/2017 0447   HgbA1c:  Lab Results  Component Value Date   HGBA1C 5.3 03/24/2017   Urine Drug Screen: No results found for: LABOPIA, COCAINSCRNUR, LABBENZ, AMPHETMU, THCU, LABBARB  Alcohol Level No results found for: [facility]   Imaging I have personally reviewed the radiological images below and agree with the radiology interpretations.  CT Head Wo Contrast 03/24/2017 Focal hypodensity/ gyral edema in the left posterior frontal lobe with extension into adjacent subcortical and periventricular white matter, suspicious for an area of infarct. There is no hemorrhage or significant mass effect. Atrophy with extensive white matter small vessel ischemic changes.  03/23/2017 IMPRESSION: No acute intracranial abnormality. Atrophy, chronic small vessel disease.    PHYSICAL EXAM Frail elderly pleasant lady currently not in distress. Afebrile.  Head is nontraumatic. Neck is supple without bruit.    Cardiac exam no murmur or gallop. Lungs are clear to auscultation. Distal pulses are well felt.  Neurological Exam:  Awake alert. Global aphasia, not able to follow commands or language output, no naming or repeating. Eyes are in primary position, PREEL. Decreased blink to threat on the right compared to the left. Fundi were not visualized. Right lower facial asymmetry. Tongue midline. Motor system exam able to move all 4 extremities against gravity, however less movement on the right LE comparing with LLE, right UE drift comparing with left UE. Deep tendon reflexes are depressed. Plantars are both downgoing. Gait was not tested.   ASSESSMENT/PLAN [name] is a 83 y.o. female with history of dementia, coronary artery disease, sick sinus syndrome with a permanent pacemaker, permanent atrial fibrillation, and hypertension presenting with facial droop. She did not receive IV t-PA.   Stroke:  possible L MCA infarct embolic secondary to known atrial fibrillation not on anticoagulation   Resultant right mild facial droop, global aphasia and mild right sided weakness  CT head 5/3 . No acute abnormality. Atrophy. Small vessel disease.  CT head 5/4 L posterior frontal lobe cortical and periventricular white matter infarct.  LDL 73  HgbA1c 5.3  Lovenox 40 mg sq daily for VTE prophylaxis DIET DYS 2 Room service appropriate? Yes; Fluid consistency: Thin  aspirin 325 mg  daily and clopidogrel 75 mg daily prior to admission, now on aspirin 325 mg daily and plavix. Not candidate for [facility] at this time.  Family do not want aggressive workup. Daughter was agreeable only to repeat CT head  Therapy recommendations:  SNF  Disposition:  pending  (From skilled nursing facility)  Atrial Fibrillation  Home anticoagulation:  none   CHA2DS2-VASc Score = at least 7, ?2 oral anticoagulation recommended  Age in Years:  ?87   +2    Sex:  Female    Female   +1    Hypertension History:  +1     Diabetes Mellitus:  0   Congestive Heart Failure History:  0  Vascular Disease History:  +1     Stroke/TIA/Thromboembolism History:    +2  Poor anticoagulation candidate given his profound dementia and poor quality of life at baseline and family's wishes     Hypertension  Elevated  Permissive hypertension (OK if < 220/120) but gradually normalize in 5-7 days Long-term BP goal normotensive  Hyperlipidemia  Home meds:  No statin  LDL 73 will not add statin given poor quality of life and family does not want to be aggressive  Other Stroke Risk Factors  Advanced age  Coronary artery disease  Other Active Problems  Sick sinus syndrome status post pacemaker  Advanced dementia  Hospital day # 2  Neurology will sign off. Please call with questions. No follow up with neurology needed at this time. Thanks for the consult.  Marvel Plan, MD PhD Stroke Neurology 03/25/2017 3:50 PM   To contact Stroke Continuity provider, please refer to [email or web address]. After hours, contact General Neurology

## 2017-03-25 NOTE — Progress Notes (Addendum)
   Subjective: She is more alert again this morning sitting in bedside chair comfortably and speaking single words "yes" and "no". She was still unable to participate well with physical therapy. She had a choking episode eating a banana this morning requiring nursing to sweep throat but coughed throughout without subsequent distress. Her daughter is present and remains uncertain about starting anticoagulation at this time.  Objective:  Vital signs in last 24 hours: Vitals:   03/24/17 2119 03/25/17 0056 03/25/17 0617 03/25/17 0936  BP: (!) 161/119 (!) 142/71 140/69 118/89  Pulse: 75 69 63 84  Resp: 18 18 18 18   Temp:   97.7 F (36.5 C) 97.7 F (36.5 C)  TempSrc:   Axillary Axillary  SpO2: 97% 99% 96% 98%   GENERAL- alert, elderly woman sitting in bedside chair in no distress HEENT- Symmetric smile, eyes tracking to both sides CARDIAC- Irregular rhythm with normal rate RESP- CTAB NEURO- Still not following commands well, moving extremities on both sides, attending to left and right EXTREMITIES- symmetric, no pedal edema, senile purpura with friable skin  Assessment/Plan:  #Acute Ischemic Stroke She has more improvement today with overall alertness and interaction. She remains mostly aphasic. She had intact swallowing function but a choking event this morning prompted restriction to a dysphagia diet. It is unclear if this is a functional or attentional problem and will be reassessed by SLP. Agree with neurology assessment that this is most likely an embolic event related to her chronic afib. The daghter is not interested in starting anticoagulation at this time so we will continue on aspirin and plavix only. We will continue to work with PT, OT, SLP evaluations and treatment. Right now she would need a skilled facility level of care for continued therapy with stroke deficits on top of her poor baseline functional status.  #Coronary artery disease #Sick sinus syndrome s/p  pacemaker #Permanent atrial fibrillation Continue ASA and restart Plavix for dual antiplatelet treatment unless anticoagulation is decided on. I will discontinue telemetry monitoring after no deviation from her baseline atrial fibrillation during this admission.  #Hypertension Currently normotensive. We will continue holding antihypertensives. She may need a beta blocker restarted in the future if she becomes tachycardic but no indication at this time.  Dispo: She would be appropriate for discharge to SNF maybe over this weekend or Monday.   Fuller Planhristopher W Anessa Charley, MD PGY-II Internal Medicine Resident Pager# [phone] 03/25/2017, 11:42 AM

## 2017-03-25 NOTE — Evaluation (Addendum)
Clinical/Bedside Swallow Evaluation Patient Details  Name: Alison Dodson MRN: [id number] Date of Birth: 1928-07-07  Today's Date: 03/25/2017 Time: SLP Start Time (ACUTE ONLY): 1440 SLP Stop Time (ACUTE ONLY): 1455 SLP Time Calculation (min) (ACUTE ONLY): 15 min  Past Medical History:  Past Medical History:  Diagnosis Date  . Anxiety   . Coronary artery disease   . Dementia   . Depression   . Dizziness   . Fall   . GERD (gastroesophageal reflux disease)   . Glaucoma   . HX: anticoagulation   . Hypertension   . Osteoarthritis   . Permanent atrial fibrillation (HCC)   . SSS (sick sinus syndrome) (HCC)   . Tachy-brady syndrome [facility](HCC)    Past Surgical History:  Past Surgical History:  Procedure Laterality Date  . BREAST REDUCTION SURGERY    . CARDIOVASCULAR STRESS TEST  02/15/2007  . CHOLECYSTECTOMY    . GASTRIC BYPASS    . INSERT / REPLACE / REMOVE PACEMAKER  01/24/2008   DDD PACEMAKER IMPLANT  . PACEMAKER INSERTION    . PERMANENT PACEMAKER GENERATOR CHANGE N/A 01/29/2013   Procedure: PERMANENT PACEMAKER GENERATOR CHANGE;  Surgeon: Hillis RangeJames Allred, MD;  [facility];  Service: Cardiovascular;  Laterality: N/A;  . TOTAL KNEE ARTHROPLASTY     right  . US ECHOCARDIOGRAPHY  08/10/2007   EF 60%  . US ECHOCARDIOGRAPHY  01/12/2007   EF 70%   HPI:  83 year old woman with PMHx significant for alzheimer's dementia, CAD, permanent Afib not on anticoagulation, HTN, glaucoma, pulmonary HTN, sick sinus syndrome s/p PPM who presents from her SNF after acute neurological changes .  CT Focal hypodensity within the left posterior frontal lobe with gyral edema. Bedside swallowing evaluation 03/24/17 findings remarkably functional swallow despite advanced dementia, regular diet with thin liquids recommended. SLP reconsulted as RN reports pt has been choking on thin liquids, had a choking episode with a banana and sausage this morning.   Assessment / Plan / Recommendation Clinical Impression  Pt presents with functional swallow despite advanced dementia. She demonstrated adequate recognition of and attention to various consistencies; appropriately used straw and masticated regular solids.  Swallow response was brisk, and there were no overt s/s of aspiration.  Mild diffuse residue with solids which clears with liquid wash. Recommend allowing a regular diet with thin liquids, medications crushed in puree as RN reports pt expectorates whole medications. Recommend full supervision, and assistance for feeding. Pt is able to self-feed finger foods. If assistance required, please allow thorough time for mastication, clearance of oral cavity prior to presenting additional boluses. Pt may benefit from alternating solids and liquids to facilitate oral clearance. No further skilled ST needs identified, SLP will s/o. SLP Visit Diagnosis: Dysphagia, unspecified (R13.10)    Aspiration Risk  Mild aspiration risk    Diet Recommendation Regular;Thin liquid   Liquid Administration via: Cup;Straw Medication Administration: Crushed with puree Supervision: Staff to assist with self feeding;Full supervision/cueing for compensatory strategies Compensations: Slow rate;Small sips/bites;Follow solids with liquid Postural Changes: Seated upright at 90 degrees    Other  Recommendations Oral Care Recommendations: Oral care BID   Follow up Recommendations None      Frequency and Duration            Prognosis Prognosis for Safe Diet Advancement: Good Barriers to Reach Goals: Cognitive deficits      Swallow Study   General Date of Onset: 03/23/17 HPI: 83 year old woman with PMHx significant for alzheimer's dementia, CAD,  permanent Afib not on anticoagulation, HTN, glaucoma, pulmonary HTN, sick sinus syndrome s/p PPM who presents from her SNF after acute neurological changes .  CT Focal hypodensity within the left posterior frontal lobe with gyral edema. Bedside swallowing evaluation 03/24/17 findings  remarkably functional swallow despite advanced dementia, regular diet with thin liquids recommended. SLP reconsulted as RN reports pt has been choking on thin liquids, had a choking episode with a banana and sausage this morning. Type of Study: Bedside Swallow Evaluation Previous Swallow Assessment: see HPI Diet Prior to this Study: Dysphagia 2 (chopped);Thin liquids Temperature Spikes Noted: No Respiratory Status: Room air History of Recent Intubation: No Behavior/Cognition: Alert;Confused Oral Cavity Assessment: Within Functional Limits Oral Care Completed by SLP: No Oral Cavity - Dentition: Adequate natural dentition Vision: Functional for self-feeding Self-Feeding Abilities: Needs assist;Able to feed self Patient Positioning: Upright in bed Baseline Vocal Quality: Normal Volitional Cough: Cognitively unable to elicit Volitional Swallow: Unable to elicit    Oral/Motor/Sensory Function Overall Oral Motor/Sensory Function: Within functional limits   Ice Chips Ice chips: Within functional limits   Thin Liquid Thin Liquid: Within functional limits Presentation: Straw;Spoon    Nectar Thick Nectar Thick Liquid: Not tested   Honey Thick Honey Thick Liquid: Not tested   Puree Puree: Within functional limits Presentation: Spoon   Solid   GO   Solid: Within functional limits Presentation: Self Fed       Rondel Baton, MS, CCC-SLP Speech-Language Pathologist [phone]  Alison Dodson 03/25/2017,3:02 PM

## 2017-03-25 NOTE — Progress Notes (Signed)
Patient is not communicating but smiles when asked questions, she is incontinent of bowel and bladder was bathed last nite and oral care has been performed numerous times throughout the night, she scoots around in the bed so turning her is not effective.

## 2017-03-25 NOTE — Evaluation (Signed)
Occupational Therapy Evaluation and Defer to SNF Patient Details Name: Alison Dodson MRN: [id number] DOB: 03-Aug-1928 Today's Date: 03/25/2017    History of Present Illness 83 year old woman with PMHx significant for alzheimer's dementia, CAD, permanent Afib not on anticoagulation, HTN, glaucoma, pulmonary HTN, sick sinus syndrome s/p PPM who presents from her SNF after acute neurological changes .  CT Focal hypodensity within the left posterior frontal lobe with gyral edema.     Clinical Impression  When therapy entered room, Patient received in chair. She is pleasant and smiling but non verbal throughout session. Patient demonstrates deficits in ADL and functional transfers as indicated below (see OT problem list). At this time, patient unable to engage in therapy session. Will defer all further acute OT needs to next venue. Recommend return to SNF with assist. OT to sign off at this point, thank you for this referral.    Follow Up Recommendations  SNF;Supervision/Assistance - 24 hour    Equipment Recommendations  Other (comment) (defer to next venue)    Recommendations for Other Services       Precautions / Restrictions Precautions Precautions: Fall Restrictions Weight Bearing Restrictions: No      Mobility Bed Mobility               General bed mobility comments: received in chair  Transfers Overall transfer level: Needs assistance Equipment used: 2 person hand held assist (face to face ) Transfers: Sit to/from Stand Sit to Stand: +2 physical assistance;Max assist         General transfer comment: Max assist to initiate power up to stand, patient with offsetting weight shift limiting power up due to cognition.     Balance Overall balance assessment: Needs assistance Sitting-balance support: Feet supported Sitting balance-Leahy Scale: Poor (to fair) Sitting balance - Comments: patient shows ability to [facility] of chair with assist, able to maintain  sitting in recliner while positioned against seat back, no over list to either side     Standing balance-Leahy Scale: Zero                             ADL either performed or assessed with clinical judgement   ADL Overall ADL's : Needs assistance/impaired                                     Functional mobility during ADLs: Wheelchair;Maximal assistance;+2 for physical assistance;+2 for safety/equipment (wc level at baseline per notes) General ADL Comments: Pt is max assist for all ADL. Pt would pick up cup but not drink upon command, Pt given brush, but could not initiate task. Once OT initiated task hand over hand she brushed a few times, but not to completion. limited verbal interaction, but mostly just pleasant smiles at therapist.      Vision Patient Visual Report: Other (comment) (unable to assess due to cognition)       Perception     Praxis      Pertinent Vitals/Pain Pain Assessment: Faces Faces Pain Scale: Hurts a little bit Pain Location: generalized Pain Descriptors / Indicators: Moaning Pain Intervention(s): Repositioned     Hand Dominance Right (reaches with right hand for juice)   Extremity/Trunk Assessment Upper Extremity Assessment Upper Extremity Assessment: Generalized weakness   Lower Extremity Assessment Lower Extremity Assessment: Generalized weakness;Difficult to assess due to impaired cognition  Cervical / Trunk Assessment Cervical / Trunk Assessment: Kyphotic   Communication Communication Communication: No difficulties   Cognition Arousal/Alertness: Awake/alert Behavior During Therapy: WFL for tasks assessed/performed Overall Cognitive Status: No family/caregiver present to determine baseline cognitive functioning Area of Impairment: Following commands                       Following Commands: Follows one step commands inconsistently;Follows one step commands with increased time       General  Comments: patient minimally verbal, intermittently following commands, otherwise just pleasently smiling and no commands   General Comments  no familiy present to determine baseline    Exercises     Shoulder Instructions      Home Living Family/patient expects to be discharged to:: Skilled nursing facility                                        Prior Functioning/Environment Level of Independence: Needs assistance        Comments: w/c use at baseline per chart        OT Problem List: Decreased strength;Decreased activity tolerance;Impaired balance (sitting and/or standing);Decreased cognition      OT Treatment/Interventions:      OT Goals(Current goals can be found in the care plan section) Acute Rehab OT Goals Patient Stated Goal: none stated  OT Frequency:     Barriers to D/C:            Co-evaluation PT/OT/SLP Co-Evaluation/Treatment: Yes Reason for Co-Treatment: Complexity of the patient's impairments (multi-system involvement);For patient/therapist safety PT goals addressed during session: Mobility/safety with mobility OT goals addressed during session: ADL's and self-care      AM-PAC PT "6 Clicks" Daily Activity     Outcome Measure Help from another person eating meals?: A Lot Help from another person taking care of personal grooming?: A Lot Help from another person toileting, which includes using toliet, bedpan, or urinal?: Total Help from another person bathing (including washing, rinsing, drying)?: A Lot Help from another person to put on and taking off regular upper body clothing?: A Lot Help from another person to put on and taking off regular lower body clothing?: Total 6 Click Score: 10   End of Session Nurse Communication: Mobility status;Need for lift equipment  Activity Tolerance: Patient tolerated treatment well Patient left: in chair;with call bell/phone within reach;with chair alarm set  OT Visit Diagnosis: Muscle weakness  (generalized) (M62.81);Feeding difficulties (R63.3);Other symptoms and signs involving cognitive function                Time: 1610-9604 OT Time Calculation (min): 17 min Charges:  OT General Charges $OT Visit: 1 Procedure OT Evaluation $OT Eval Moderate Complexity: 1 Procedure G-Codes:     Sherryl Manges OTR/L [phone]  Evern Bio Gaetan Spieker 03/25/2017, 9:35 AM

## 2017-03-25 NOTE — Evaluation (Signed)
Physical Therapy Evaluation Patient Details Name: Alison Dodson MRN: [id number] DOB: 1928-01-03 Today's Date: 03/25/2017   History of Present Illness  83 year old woman with PMHx significant for alzheimer's dementia, CAD, permanent Afib not on anticoagulation, HTN, glaucoma, pulmonary HTN, sick sinus syndrome s/p PPM who presents from her SNF after acute neurological changes .  CT Focal hypodensity within the left posterior frontal lobe with gyral edema.    Clinical Impression  Patient seen for mobility assessment. Patient received in chair. She is pleasant but non verbal throughout session. Patient demonstrates deficits in functional mobility as indicated below. Fell patient might benefit from PT however, at this time, patient unable to engage in therapy session. Will defer all further acute PT needs to next venue. Recommend return to SNF with assist. Encourage OOB to chair with nsg as tolerated. PT to sign off.     Follow Up Recommendations SNF;Supervision/Assistance - 24 hour (return to SNF memory care with assist)    Equipment Recommendations  None recommended by PT    Recommendations for Other Services       Precautions / Restrictions Precautions Precautions: Fall Restrictions Weight Bearing Restrictions: No      Mobility  Bed Mobility               General bed mobility comments: received in chair  Transfers Overall transfer level: Needs assistance Equipment used: 2 person hand held assist (face to face ) Transfers: Sit to/from Stand Sit to Stand: +2 physical assistance;Max assist         General transfer comment: Max assist to initiate power up to stand, patient with offsetting weight shift limiting power up due to cognition.   Ambulation/Gait             General Gait Details: w/c bound at baseline per chart  Stairs            Wheelchair Mobility    Modified Rankin (Stroke Patients Only)       Balance Overall balance assessment: Needs  assistance Sitting-balance support: Feet supported Sitting balance-Leahy Scale: Poor (to fair) Sitting balance - Comments: patient shows ability to sit upright at [facility] of chair with assist, able to maintain sitting in recliner while positioned against seat back, no over list to either side     Standing balance-Leahy Scale: Zero                               Pertinent Vitals/Pain Pain Assessment: Faces Faces Pain Scale: Hurts a little bit Pain Location: generalized Pain Descriptors / Indicators: Moaning Pain Intervention(s): Repositioned    Home Living Family/patient expects to be discharged to:: Skilled nursing facility                      Prior Function Level of Independence: Needs assistance         Comments: w/c use at baseline per chart     Hand Dominance   Dominant Hand: Right (reaches with right hand for juice)    Extremity/Trunk Assessment   Upper Extremity Assessment Upper Extremity Assessment: Generalized weakness;Difficult to assess due to impaired cognition    Lower Extremity Assessment Lower Extremity Assessment: Generalized weakness;Difficult to assess due to impaired cognition (able to wiggles toes and hold against gravity bilaterally)    Cervical / Trunk Assessment Cervical / Trunk Assessment: Kyphotic  Communication   Communication: No difficulties  Cognition Arousal/Alertness: Awake/alert Behavior During Therapy:  WFL for tasks assessed/performed Overall Cognitive Status: No family/caregiver present to determine baseline cognitive functioning Area of Impairment: Following commands                       Following Commands: Follows one step commands inconsistently;Follows one step commands with increased time       General Comments: patient minimally verbal, intermittently following commands, otherwise just pleasently smiling and no commands      General Comments      Exercises     Assessment/Plan    PT  Assessment All further PT needs can be met in the next venue of care  PT Problem List Decreased strength;Decreased activity tolerance;Decreased balance;Decreased mobility;Decreased cognition       PT Treatment Interventions      PT Goals (Current goals can be found in the Care Plan section)  Acute Rehab PT Goals PT Goal Formulation: Patient unable to participate in goal setting    Frequency     Barriers to discharge        Co-evaluation PT/OT/SLP Co-Evaluation/Treatment: Yes Reason for Co-Treatment: Complexity of the patient's impairments (multi-system involvement);For patient/therapist safety PT goals addressed during session: Mobility/safety with mobility         AM-PAC PT "6 Clicks" Daily Activity  Outcome Measure Difficulty turning over in bed (including adjusting bedclothes, sheets and blankets)?: Total Difficulty moving from lying on back to sitting on the side of the bed? : Total Difficulty sitting down on and standing up from a chair with arms (e.g., wheelchair, bedside commode, etc,.)?: Total Help needed moving to and from a bed to chair (including a wheelchair)?: Total Help needed walking in hospital room?: Total Help needed climbing 3-5 steps with a railing? : Total 6 Click Score: 6    End of Session   Activity Tolerance: Patient tolerated treatment well (limited ability to participate) Patient left: in chair;with call bell/phone within reach;with chair alarm set Nurse Communication: Mobility status PT Visit Diagnosis: Other symptoms and signs involving the nervous system (R29.898)    Time: 0539-7673 PT Time Calculation (min) (ACUTE ONLY): 17 min   Charges:   PT Evaluation $PT Eval Moderate Complexity: 1 Procedure     PT G Codes:        Alben Deeds, PT DPT  [phone]   Duncan Dull 03/25/2017, 8:51 AM

## 2017-03-26 DIAGNOSIS — Z950 Presence of cardiac pacemaker: Secondary | ICD-10-CM

## 2017-03-26 DIAGNOSIS — Z7902 Long term (current) use of antithrombotics/antiplatelets: Secondary | ICD-10-CM

## 2017-03-26 DIAGNOSIS — I2510 Atherosclerotic heart disease of native coronary artery without angina pectoris: Secondary | ICD-10-CM

## 2017-03-26 DIAGNOSIS — Z8679 Personal history of other diseases of the circulatory system: Secondary | ICD-10-CM

## 2017-03-26 NOTE — Progress Notes (Signed)
  Date: 03/26/2017  Patient name: Alison Dodson  Medical record number: [id number]  Date of birth: [birth year]   I have seen and evaluated this patient and I have discussed the plan of care with the house staff. Please see their note for complete details. I concur with their findings with the following additions/corrections: Alison Dodson was seen on morning rounds. She was sitting in a recliner and had just gotten slightly chocked after drinking OJ. She had a decent cough. Her son and daughter were at the bedside. They indicated that she was slightly agitated last night but slept very well overnight. Waiting on transfer to a skilled nursing facility.   Burns SpainButcher, Montel Vanderhoof A, MD 03/26/2017, 2:31 PM

## 2017-03-26 NOTE — Clinical Social Work Note (Signed)
Clinical Social Work Assessment  Patient Details  Name: Alison Dodson MRN: [id number] Date of Birth: [birth year]  Date of referral:  03/26/17               Reason for consult:  Facility Placement                Permission sought to share information with:    Permission granted to share information::     Name::     Alison Dodson  Agency::     Relationship::  Daughter  Contact Information:  [phone]  Housing/Transportation Living arrangements for the past 2 months:  Assisted Living Facility Source of Information:  Adult Children Patient Interpreter Needed:  None Criminal Activity/Legal Involvement Pertinent to Current Situation/Hospitalization:  No - Comment as needed Significant Relationships:  Adult Children Lives with:  Self Do you feel safe going back to the place where you live?    Need for family participation in patient care:     Care giving concerns:  Pt is disorieted x4. CSW spoke with pt's daughter via telephone.   Social Worker assessment / plan:  CSW spoke with pt's daughter via telephone. [facility]. Pt will need to be placed in a SNF with a memory care unit. Pt's daughter is agreeable to SNF for pt at d/c. Pt's daughter states it needs to be a facility near "home". After clarification, home is defined as [place]. CS[facility] will do [facility] f/o and provide b/o once available.   Employment status:  Retired Health and safety inspectornsurance information:  Medicare PT Recommendations:  Skilled Nursing Facility Information / Referral to community resources:  Skilled Nursing Facility  Patient/Family's Response to care:  Pt's daughter verbalized understanding of CSW role and expressed appreciation for support. Pt's daughter denies any concern regarding pt care at this time.   Patient/Family's Understanding of and Emotional Response to Diagnosis, Current Treatment, and Prognosis:  Pt's daughter understanding and realistic regarding physical limitations. Pt's  daughter understands the need for SNF placement at d/c. Pt's daughter agreeable to SNF placement at d/c, at this time.  Pt's daughter denies any concern regarding treatment plan at this time. CSW will continue to provide support and facilitate d/c needs.   Emotional Assessment Appearance:  Appears stated age Attitude/Demeanor/Rapport:  Unable to Assess Affect (typically observed):  Unable to Assess Orientation:   (Disorriented x4) Alcohol / Substance use:  Not Applicable Psych involvement (Current and /or in the community):  No (Comment)  Discharge Needs  Concerns to be addressed:  No discharge needs identified Readmission within the last 30 days:  No Current discharge risk:  Dependent with Mobility Barriers to Discharge:  Continued Medical Work up   Pacific MutualBridget A Vedant Shehadeh, LCSW 03/26/2017, 10:28 AM

## 2017-03-26 NOTE — Clinical Social Work Placement (Signed)
   CLINICAL SOCIAL WORK PLACEMENT  NOTE  Date:  03/26/2017  Patient Details  Name: Alison Dodson MRN: [id number] Date of Birth: 06-Oct-1928  Clinical Social Work is seeking post-discharge placement for this patient at the Skilled  Nursing Facility level of care (*CSW will initial, date and re-position this form in  chart as items are completed):      Patient/family provided with [facility] Clinical Social Work Department's list of facilities offering this level of care within the geographic area requested by the patient (or if unable, by the patient's family).  Yes   Patient/family informed of their freedom to choose among providers that offer the needed level of care, that participate in Medicare, Medicaid or managed care program needed by the patient, have an available bed and are willing to accept the patient.      Patient/family informed of [facility] ownership interest in [facility] and [facility], as well as of the fact that they are under no obligation to receive care at these facilities.  PASRR submitted to EDS on       PASRR number received on 03/26/17     Existing PASRR number confirmed on       FL2 transmitted to all facilities in geographic area requested by pt/family on 03/26/17     FL2 transmitted to all facilities within larger geographic area on       Patient informed that his/her managed care company has contracts with or will negotiate with certain facilities, including the following:        Yes   Patient/family informed of bed offers received.  Patient chooses bed at       Physician recommends and patient chooses bed at      Patient to be transferred to   on  .  Patient to be transferred to facility by PTAR     Patient family notified on   of transfer.  Name of family member notified:        PHYSICIAN Please sign DNR, Please prepare priority discharge summary, including medications, Please prepare prescriptions, Please sign FL2      Additional Comment:    _______________________________________________ Alison KrabbeBridget A Quaran Kedzierski, LCSW 03/26/2017, 10:36 AM

## 2017-03-26 NOTE — Progress Notes (Signed)
   Subjective: Ms. Virgia LandDavern is sitting comfortably in her bedside chair with son and daughter present. She is coughing this morning after swallowing orange juice but tolerated breakfast in small bites without difficulty. She is not following commands well enough to work much with the therapists so far.  Objective:  Vital signs in last 24 hours: Vitals:   03/25/17 2113 03/26/17 0111 03/26/17 0545 03/26/17 1049  BP: (!) 125/92 (!) 163/93 (!) 153/99 137/69  Pulse: 69 72 69 62  Resp: 18 20 20 18   Temp: 98 F (36.7 C) 98.2 F (36.8 C) 98.1 F (36.7 C) 97.6 F (36.4 C)  TempSrc: Axillary Axillary Axillary Axillary  SpO2: 99% 99% 99% 98%   GENERAL- alert, elderly woman sitting in bedside chair in no distress HEENT- Symmetric smile, eyes tracking to both sides CARDIAC- Irregular rhythm with normal rate RESP- CTAB, nonproductive coughing NEURO- Moving all extremities spontaneously, attending to left and right, not following commands EXTREMITIES- symmetric, no pedal edema, senile purpura  Assessment/Plan: #Acute Ischemic Stroke Clinical status is similar to yesterday. She has a good recovery from Friday but remains nearly aphasic and unable to follow one step commands. She has ongoing swallowing trouble that appears more attentional than muscular so observed feeding may be needed going forwards. No further workup or interventions are planned at this time and neuro recommendations have been appreciated. We will continue to work with PT, OT, SLP evaluations and treatment. She would need a skilled facility level of care for continued therapy with stroke deficits on top of her poor baseline functional status. Her previous ALF/memory unit does not have this capacity. Clinical social work is consulted and discussing facility options with family. Both children share HCPOA but her daughter is the primary medical decision maker.  #Coronary artery disease #Sick sinus syndrome s/p pacemaker #Permanent  atrial fibrillation We will continue antiplatelet therapy alone at this time based on family preferences that side effects of minor bleeding may impair quality of life even though this places her at high risk for a repeat stroke.  #Hypertension Currently normotensive. We will continue holding antihypertensives. She may need a beta blocker restarted in the future if she becomes tachycardic but no indication at this time.  Dispo: At this time she is appropriate for discharge to SNF when placement available.   Fuller Planhristopher W Retal Tonkinson, MD PGY-II Internal Medicine Resident Pager# [phone] 03/26/2017, 10:57 AM

## 2017-03-26 NOTE — NC FL2 (Signed)
[place] MEDICAID FL2 LEVEL OF CARE SCREENING TOOL     IDENTIFICATION  Patient Name: Alison Dodson Birthdate: 12-30-1927 Sex: female Admission Date (Current Location): 03/23/2017  [facility] and [place] Number:  Producer, television/film/video and Address:  [facility], [address] [place], [place] [address]      Provider Number: [id number]  Attending Physician Name and Address:  Burns Spain, MD  Relative Name and Phone Number:  Daughter: Judy,    [phone]       Current Level of Care: Hospital Recommended Level of Care: Skilled Nursing Facility Prior Approval Number:    Date Approved/Denied:   PASRR Number: [id number]  Discharge Plan: SNF    Current Diagnoses: Patient Active Problem List   Diagnosis Date Noted  . Stroke (HCC) 03/23/2017  . Chronic atrial fibrillation (HCC)   . Pacemaker 02/05/2016  . Pulmonary HTN (HCC) 02/05/2016  . Moderate tricuspid regurgitation 02/05/2016  . CAD (coronary artery disease) 05/19/2013  . Alzheimer's dementia 07/28/2012  . Dizzy 08/02/2011  . Prerenal azotemia 08/02/2011  . Essential hypertension, benign 02/02/2011  . Atrial fibrillation (HCC) 02/02/2011  . BRADYCARDIA-TACHYCARDIA SYNDROME 02/02/2011    Orientation RESPIRATION BLADDER Height & Weight      (Disorriented x4)  Normal Incontinent Weight:   Height:     BEHAVIORAL SYMPTOMS/MOOD NEUROLOGICAL BOWEL NUTRITION STATUS      Incontinent  (Please see d/c summary)  AMBULATORY STATUS COMMUNICATION OF NEEDS Skin   Extensive Assist Verbally Normal                       Personal Care Assistance Level of Assistance  Bathing, Feeding, Dressing Bathing Assistance: Maximum assistance Feeding assistance: Limited assistance Dressing Assistance: Maximum assistance     Functional Limitations Info  Hearing, Speech, Sight Sight Info: Adequate Hearing Info: Adequate Speech Info: Adequate    SPECIAL CARE FACTORS FREQUENCY  PT (By  licensed PT), OT (By licensed OT)     PT Frequency: 2x/week OT Frequency: 2x/week            Contractures Contractures Info: Not present    Additional Factors Info  Code Status, Allergies Code Status Info: DNR Allergies Info: Codeine, Iodine           Current Medications (03/26/2017):  This is the current hospital active medication list Current Facility-Administered Medications  Medication Dose Route Frequency Provider Last Rate Last Dose  . acetaminophen (TYLENOL) tablet 500 mg  500 mg Oral Q4H PRN Fuller Plan, MD      . aspirin EC tablet 325 mg  325 mg Oral Daily Fuller Plan, MD   325 mg at 03/26/17 8469  . clopidogrel (PLAVIX) tablet 75 mg  75 mg Oral Q breakfast Fuller Plan, MD   75 mg at 03/26/17 0936  . enoxaparin (LOVENOX) injection 40 mg  40 mg Subcutaneous Q24H Rice, Jamesetta Orleans, MD   40 mg at 03/23/17 1752  . ketotifen (ZADITOR) 0.025 % ophthalmic solution 1 drop  1 drop Both Eyes BID Fuller Plan, MD   1 drop at 03/26/17 [phone]  . latanoprost (XALATAN) 0.005 % ophthalmic solution 1 drop  1 drop Both Eyes QHS Fuller Plan, MD   1 drop at 03/25/17 2138  . loperamide (IMODIUM) capsule 2 mg  2 mg Oral PRN Fuller Plan, MD      . mirtazapine (REMERON) tablet 15 mg  15 mg Oral QHS Rice, Jamesetta Orleans, MD  15 mg at 03/25/17 2138  . QUEtiapine (SEROQUEL) tablet 25 mg  25 mg Oral BID Fuller Planice, Christopher W, MD   25 mg at 03/26/17 [phone]   Facility-Administered Medications Ordered in Other Encounters  Medication Dose Route Frequency Provider Last Rate Last Dose  . 0.45 % sodium chloride infusion   Intravenous Continuous Allred, James, MD      . 0.9 %  sodium chloride infusion  250 mL Intravenous Continuous Allred, James, MD      . chlorhexidine (HIBICLENS) 4 % liquid 4 application  60 mL Topical Once Allred, James, MD      . sodium chloride 0.9 % injection 3 mL  3 mL Intravenous Q12H Allred, James, MD      . sodium chloride 0.9 %  injection 3 mL  3 mL Intravenous PRN Hillis RangeAllred, James, MD         Discharge Medications: Please see discharge summary for a list of discharge medications.  Relevant Imaging Results:  Relevant Lab Results:   Additional Information SSN: [id number]  Pt will need memory care  Dorance Spink A Aniston Christman, LCSW

## 2017-03-27 MED ORDER — LISINOPRIL 20 MG PO TABS
20.0000 mg | ORAL_TABLET | Freq: Every day | ORAL | Status: DC
  Administered 2017-03-27 – 2017-03-28 (×2): 20 mg via ORAL
  Filled 2017-03-27 (×2): qty 1

## 2017-03-27 MED ORDER — ASPIRIN EC 81 MG PO TBEC
81.0000 mg | DELAYED_RELEASE_TABLET | Freq: Every day | ORAL | Status: DC
  Administered 2017-03-28: 81 mg via ORAL
  Filled 2017-03-27: qty 1

## 2017-03-27 NOTE — Clinical Social Work Note (Signed)
Clinical Social Worker continuing to follow patient and family for support and discharge planning needs.  CSW spoke with patient daughter over the phone to provide bed offers and encourage facility choice.  Patient daughter is aware that patient will discharge tomorrow and plans to make a facility choice overnight.  CSW has explained to patient daughter about the differences for ST-SNF vs. Long term care and the necessity to apply for Medicaid.  CSW remains available for support and to facilitate patient discharge needs.  Macario GoldsJesse Viral Schramm, [place] [phone]

## 2017-03-27 NOTE — Progress Notes (Signed)
Internal Medicine Attending:   I saw and examined the patient. I reviewed the resident's note and I agree with the resident's findings and plan as documented in the resident's note.  Alzheimer's dementia with functional status near her baseline, which is very poor. We are awaiting SNF placement, there is a desire from the daughter for a longterm SNF facility which will likely delay discharge a few days.

## 2017-03-27 NOTE — Progress Notes (Signed)
  Subjective: Alison Dodson is sitting up beside her bed and smiling. Her daughter is at the bedside at reports that she still is coughing more with oral intake than is normal for her, but has not had any further choking episodes with oral intake.  Objective: Vital signs in last 24 hours: Vitals:   03/26/17 1745 03/26/17 2121 03/27/17 0200 03/27/17 0533  BP: (!) 161/94 (!) 159/98 (!) 161/98 (!) 164/105  Pulse: 69 66 61 64  Resp: 18 18 18 18   Temp: 97.9 F (36.6 C) 98.2 F (36.8 C) 98.1 F (36.7 C) 97.5 F (36.4 C)  TempSrc: Axillary Axillary Axillary Axillary  SpO2: 100% 97% 97% 98%   Physical Exam: General: Frail-appearing female sitting up in bedside chair and smiling. HEENT: Head normocephalic, atraumatic. Pupils equal, round, and reactive to light. No eye deviation noted on resting gaze. Moist mucus membranes. Cardiac: Irregularly irregular rhythm. No murmurs, rubs, or gallops. Respiratory: Clear to auscultation bilaterally. Normal work of breathing. Abdomen: Soft, non-distended. Extremities: Warm and dry. Radial and dorsalis pedis pulses 2+ bilaterally. Neurologic: Appears alert. Occasionally responds with monosyllables ("Okay" / "Alright"). Difficulty following commands. Moving all extremities.  Problem List: -- Acute ischemic stroke of left MCA distribution -- Coronary artery disease -- Sick sinus syndrome s/p pacemaker -- Permanent a fib not on anticoagulation -- Hypertension  Assessment and Plan by Problem: #Acute ischemic stroke of left MCA distribution: Symptoms have improved since initial presentation but she has not returned to her baseline. As per SL swallow evaluation, she is able to have a regular diet with thin liquids and full supervision/assistance with feeding, though she should be monitored closely for choking. She will require a skilled facility level of care upon discharge. CSW working to find placement. -- Continue work with CSW to find best  placement  #Coronary artery disease #Sick sinus syndrome s/p pacemaker #Permanent a fib not on anticoagulation CHADS2VASC =7. Family continues to be opposed to anticoagulation due to concerns about falls and bruising. -- Continue home clopidogrel; decrease aspirin dose 325mg  daily --> 81mg  daily given family's concern for bruising and lack of current evidence supporting benefit of higher dose for secondary prevention.  #Hypertension BPs over past 24 hours 137/69-105; HRs 61-71. -- Resume home lisinopril -- Continue to hold home atenolol given HR during admission in the 60s and concern for fall risk.  PPX: SCDs FENGI: DYS 2 Code: DNR Dispo: Discharge pending SNF placement.  This is a Psychologist, occupationalMedical Student Note.  The care of the patient was discussed with Dr. Dimple Caseyice and the assessment and plan formulated with their assistance.  Please see their attached note for official documentation of the daily encounter.   LOS: 4 days   Alison Dodson, Alison Dodson, Medical Student 03/27/2017, 8:38 AM

## 2017-03-27 NOTE — Care Management Important Message (Signed)
Important Message  Patient Details  Name: Alison Dodson MRN: [id number] Date of Birth: Mar 26, 1928   Medicare Important Message Given:  Yes    Zaylen Susman 03/27/2017, 3:07 PM

## 2017-03-27 NOTE — Clinical Social Work Note (Signed)
CSW spoke with pt's daughter this AM to discuss bed offers. Pt's daughter is now stating her mom needs long term SNF, however pt does not have Medicaid. Pt's daughter explained [name] is expensive and her mom would be private pay until she received Medicaid. Pt's daughter is going to apply for Medicaid for her mom. CSW will continue to work with pt's daughter and SNF facilities in order to find a long term, memory care bed.  Redding CenterBridget Bronsen Dodson, [place] [phone]

## 2017-03-27 NOTE — Care Management Note (Signed)
Case Management Note  Patient Details  Name: Verna CzechBillie S Brining MRN: [id number] Date of Birth: 01-20-1928  Subjective/Objective:      Pt admitted with CVA. [facility]              Action/Plan: Plan is to d/c to SNF. CM following.  Expected Discharge Date:                  Expected Discharge Plan:  Skilled Nursing Facility  In-House Referral:  Clinical Social Work  Discharge planning Services     Post Acute Care Choice:    Choice offered to:     DME Arranged:    DME Agency:     HH Arranged:    HH Agency:     Status of Service:  In process, will continue to follow  If discussed at Long Length of Stay Meetings, dates discussed:    Additional Comments:  Kermit BaloKelli F Cicily Bonano, RN 03/27/2017, 4:26 PM

## 2017-03-27 NOTE — Progress Notes (Signed)
   Subjective: [name] is resting in hr bedside chair alert but only slightly interactive. She is drinking juice with a few coughing episodes but ate breakfast well with full supervision. She has not had any repeat choking events. She remains mostly aphasic and not following commands.  Objective:  Vital signs in last 24 hours: Vitals:   03/26/17 2121 03/27/17 0200 03/27/17 0533 03/27/17 1054  BP: (!) 159/98 (!) 161/98 (!) 164/105 (!) 131/93  Pulse: 66 61 64 67  Resp: 18 18 18 16   Temp: 98.2 F (36.8 C) 98.1 F (36.7 C) 97.5 F (36.4 C) 97.9 F (36.6 C)  TempSrc: Axillary Axillary Axillary Oral  SpO2: 97% 97% 98% 98%   GENERAL- alert, elderly woman sitting in bedside chair in no distress HEENT- Symmetric smile, eyes tracking to both sides, moist mucous membranes CARDIAC- Irregular rhythm with normal rate RESP- CTAB, normal WOB NEURO- Moving all extremities spontaneously, some single unintelligible sounds, attending to left and right, not following commands  Assessment/Plan: #Acute Ischemic Stroke Her clinical status remains mostly the same as yesterday. She has not had any major aspiration events so we'll continue her current diet. She continues to need full assistance with transfer and meals so we are pursuing SNF level of care after she leaves this hospitalization. CSW is pursuing placement options alongside family. We are continuing daily therapy assessments in the meantime although these remain limited by not following many commands.  #Coronary artery disease #Sick sinus syndrome s/p pacemaker #Permanent atrial fibrillation She will be continued on antiplatelet therapy alone so we will adjust this to ASA 81mg  and her plavix for best benefit and risk. Anticoagulation is not being started based on family wishes with concern for multiple minor bleeding symptoms previously.  #Hypertension She is moderately uncontrolled BP today in Afib with a well controlled ventricular rhythm.  We will restart her home lisinopril today but continue holding atenolol.  Dispo: At this time she is appropriate for discharge to SNF when placement available.   Fuller Planhristopher W Maykel Reitter, MD PGY-II Internal Medicine Resident Pager# [phone] 03/27/2017, 1:00 PM

## 2017-03-28 DIAGNOSIS — G309 Alzheimer's disease, unspecified: Secondary | ICD-10-CM

## 2017-03-28 DIAGNOSIS — I272 Pulmonary hypertension, unspecified: Secondary | ICD-10-CM

## 2017-03-28 NOTE — Discharge Summary (Signed)
Name: Alison Dodson MRN: [id number] DOB: 10/12/1928 83 y.o. PCP: Ron ParkerBowen, Samuel, MD  Date of Admission: 03/23/2017  9:34 AM Date of Discharge: 03/28/2017 Attending Physician: Tyson AliasVincent, Duncan Thomas, *  Discharge Diagnosis: Principal Problem:   Acute ischemic stroke [facility](HCC) Active Problems:   Essential hypertension, benign   BRADYCARDIA-TACHYCARDIA SYNDROME   Alzheimer's dementia   CAD (coronary artery disease)   Pulmonary HTN (HCC)   Chronic atrial fibrillation [facility](HCC)   Discharge Medications: Allergies as of 03/28/2017      Reactions   Codeine Other (See Comments)   unknown   Iodine Other (See Comments)   Rash and swelling      Medication List    STOP taking these medications   atenolol 25 MG tablet Commonly known as:  TENORMIN   atenolol 50 MG tablet Commonly known as:  TENORMIN   clopidogrel 75 MG tablet Commonly known as:  PLAVIX   donepezil 10 MG tablet Commonly known as:  ARICEPT     TAKE these medications   acetaminophen 500 MG tablet Commonly known as:  TYLENOL Take 500 mg by mouth every 4 (four) hours as needed for mild pain or headache (Not to exceed 4000 mg for fever up to 101.).   aspirin EC 81 MG tablet Take 81 mg by mouth daily. What changed:  Another medication with the same name was removed. Continue taking this medication, and follow the directions you see here.   azelastine 0.05 % ophthalmic solution Commonly known as:  OPTIVAR Place 1 drop into both eyes 2 (two) times daily.   BAZA PROTECT EX Apply 1 application topically as needed (each incontinence episode). What changed:  Another medication with the same name was removed. Continue taking this medication, and follow the directions you see here.   cholecalciferol 1000 units tablet Commonly known as:  VITAMIN D Take 1,000 Units by mouth daily.   Cranberry 450 MG Tabs Take 1 tablet by mouth daily.   latanoprost 0.005 % ophthalmic solution Commonly known as:  XALATAN Place 1 drop into  both eyes at bedtime.   lisinopril 20 MG tablet Commonly known as:  PRINIVIL,ZESTRIL Take 1 tablet (20 mg total) by mouth daily. Reported on 02/05/2016   loperamide 2 MG capsule Commonly known as:  IMODIUM Take 2 mg by mouth as needed for diarrhea or loose stools (Not to exceed 8 doses in 24 hours).   LUBRICANT EYE DROPS 0.5 % Soln Generic drug:  carboxymethylcellulose Place 1 drop into both eyes 4 (four) times daily.   MINTOX 200-200-20 MG/5ML suspension Generic drug:  alum & mag hydroxide-simeth Take 30 mLs by mouth every 6 (six) hours as needed for indigestion or heartburn (Not to exceed 4 doses in 24 hours).   mirtazapine 15 MG tablet Commonly known as:  REMERON Take 15 mg by mouth at bedtime.   NON FORMULARY Take 1 Can by mouth 2 (two) times daily. Mighty Shake   QUEtiapine 25 MG tablet Commonly known as:  SEROQUEL Take 1 tablet (25 mg total) by mouth 2 (two) times daily. Reported on 02/05/2016   RISAMINE 0.44-20.625 % Oint Generic drug:  Menthol-Zinc Oxide Apply 1 application topically daily as needed (Apply to sacrum and buttocks after each incontinent episode).       Disposition and follow-up:   Alison Dod[facility] [place]  At the hospital follow up visit please address:  1. Acute ischemic stroke: She has new aphasia and worsened attentional dysphagia  after this stroke. Follow up any progress with speech therapy and plans to minimize aspiration risk.  Follow-up Appointments:   Hospital Course by problem list: #Acute ischemic stroke Alison Dodson presented to the ED from Memory Care at [facility] on 5/3 due to acute neurologic changes. Upon evaluation she was found to have expressive aphasia, difficulty following commands, and right hemispatial neglect. CBC, BMP, UA, Hgb A1c, lipid panel were not signficantly abnormal. CXR was negative. Initial head CT on 5/3 showed atrophy and chronic small vessel  disease with no acute intracranial abnormality; repeat head CT on 5/4 showed focal hypodensity and gyral edema in the left posterior frontal lobe suspicious for infarct. It was concluded that she likely had an acute ischemic stroke due to atrial fibrillation without anticoagulation. Her daughter and son, who share power of attorney, were counseled on the risks and benefits of anticoagulation for secondary prevention of stroke but decided not to treat for concerns regarding falls/bruising and quality of life. Her deficits partially improved during her hospitalization, but she continued to have difficulty speaking and following commands and required full assistance for transfers and at meals. She did have one episode of choking and based on swallow study this appeared primarily attentional.  #Essential hypertension Blood pressures were moderately elevated after holding all PO meds. She was restarted on home lisinopril but atenolol was discontinued to reduce pill burden and with normal heart rates.  #Chronic atrial fibrillation Patient's family declined anticoagulation therapy although this probably caused her stroke. Rates were consistently normal. She is continued on 81mg  ASA alone at d/c.  #Alzheimer's dementia Donepezil was not being taken daily PTA so this medicine was discontinued during her hospitalization.   Discharge Vitals:   BP (!) 142/75 (BP Location: Left Arm)   Pulse 76   Temp 97.6 F (36.4 C) (Oral)   Resp 20   SpO2 97%   Pertinent Labs, Studies, and Procedures:  CT head 5/4: Focal hypodensity/ gyral edema in the left posterior frontal lobe with extension into adjacent subcortical and periventricular white matter, suspicious for an area of infarct.  Discharge Instructions: Discharge Instructions    Call MD for:  persistant nausea and vomiting    Complete by:  As directed    Diet - low sodium heart healthy    Complete by:  As directed    Discharge instructions    Complete by:   As directed    Medication changes are stopping plavix and atenolol.  Continue participation with speech therapy as able although this will likely remain limited.   Increase activity slowly    Complete by:  As directed       Signed: Fuller Plan, MD PGY-II Internal Medicine Resident Pager# [phone] 03/28/2017, 11:26 AM

## 2017-03-28 NOTE — Clinical Social Work Placement (Signed)
   CLINICAL SOCIAL WORK PLACEMENT  NOTE  Date:  03/28/2017  Patient Details  Name: Verna CzechBillie S Mark MRN: [id number] Date of Birth: [birth year]  Clinical Social Work is seeking post-discharge placement for this patient at the Skilled  Nursing Facility level of care (*CSW will initial, date and re-position this form in  chart as items are completed):  Yes   Patient/family provided with [facility] Clinical Social Work Department's list of facilities offering this level of care within the geographic area requested by the patient (or if unable, by the patient's family).  Yes   Patient/family informed of their freedom to choose among providers that offer the needed level of care, that participate in Medicare, Medicaid or managed care program needed by the patient, have an available bed and are willing to accept the patient.      Patient/family informed of [facility]'s ownership interest in [facility] and [facility], as well as of the fact that they are under no obligation to receive care at these facilities.  PASRR submitted to EDS on       PASRR number received on 03/26/17     Existing PASRR number confirmed on       FL2 transmitted to all facilities in geographic area requested by pt/family on 03/26/17     FL2 transmitted to all facilities within larger geographic area on       Patient informed that his/her managed care company has contracts with or will negotiate with certain facilities, including the following:        Yes   Patient/family informed of bed offers received.  [facility]     Physician recommends and patient chooses bed at      Patient [facility] on 03/28/17.  Patient to be transferred to facility by [facility]     Patient family notified on 03/28/17 of transfer.  Name of family member notified:  Pt daughter     PHYSICIAN Please sign DNR, Please prepare priority discharge  summary, including medications, Please prepare prescriptions, Please sign FL2     Additional Comment:    _______________________________________________ Baldemar LenisElizabeth M Elliette Seabolt, LCSW 03/28/2017, 12:18 PM

## 2017-03-28 NOTE — Progress Notes (Signed)
Discharge to: [facility] Anticipated discharge date: 03/28/17 Family notified: Yes, daughter, at bedside Transportation by: [facility]  CSW signing off.  Blenda Nicelylizabeth Aminata [place] LCSW [phone]

## 2017-03-28 NOTE — Progress Notes (Signed)
Pt discharged from hospital per orders from MD. Pt and family educated on discharge information. Pt's family verbalized understanding of information. [facility]. Pt's IV was removed prior to discharge. Pt exited hospital via stretcher.

## 2017-03-28 NOTE — Progress Notes (Signed)
  Subjective: [name] is resting peacefully in bed this morning. She turns her head and smiles as we enter the room. Her daughter notes that she and her brother have been working with the Child psychotherapistsocial worker to choose a SNF. She also expresses a desire to discontinue Plavix in order to reduce her mother's pill burden and bruising risk.  Objective: Vital signs in last 24 hours: Vitals:   03/27/17 2058 03/28/17 0133 03/28/17 0525 03/28/17 1014  BP: (!) 142/97 (!) 150/98 (!) 152/94 (!) 142/75  Pulse: 75 64 61 76  Resp: 16 16 20 20   Temp: 98.3 F (36.8 C) 97.5 F (36.4 C) 97.4 F (36.3 C) 97.6 F (36.4 C)  TempSrc: Axillary Axillary Oral Oral  SpO2: 99% 100% 100% 97%   Physical Exam:  General: Frail-appearing female in bed smiling. HEENT: Head normocephalic, atraumatic. No eye deviation noted on resting gaze. Moist mucus membranes. Cardiac: Irregularly irregular rhythm. No murmurs, rubs, or gallops. Respiratory: Clear to auscultation bilaterally. Normal work of breathing. Abdomen: Soft, non-distended. Extremities: Warm and dry. No lower extremity edema. Neurologic: Appears alert. Occasionally responds with monosyllables. Difficulty following commands.  Assessment/Plan: Problem List: -- Acute ischemic stroke of left MCA distribution -- Coronary artery disease -- Sick sinus syndrome s/p pacemaker -- Permanent a fib not on anticoagulation -- Hypertension  Assessment and Plan by Problem: #Acute ischemic stroke of left MCA distribution: Symptoms have improved since initial presentation and appear to be stable. As per SL swallow evaluation, she is able to have a regular diet with thin liquids and full supervision/assistance with feeding, though she should be monitored closely for choking. She will require a skilled facility level of care upon discharge. CSW and daughter working together and plan to choose a facility for discharge today.  #Coronary artery disease #Sick sinus syndrome s/p  pacemaker #Permanent a fib not on anticoagulation CHADS2VASC =7. Family continues to be opposed to anticoagulation due to concerns about falls and bruising. Daughter would also like to discontinue clopidogrel due to concerns about pill burden/easy bruising. Given advanced age and low baseline functional status, clopidogrel is unlikely to be of significant benefit, and quality of life considerations are certainly valid. Will discontinue clopidogrel and discharge on 81mg  aspirin only.  #Hypertension BPs over past 24 hours 131-164/88-105; HRs 60-75. Will continue home lisinopril upon discharge but continue to hold home atenolol given HR during admission in the 60s and concern for fall risk.  PPX: SCDs FENGI: DYS 2 Code: DNR Dispo: Discharge to SNF today.  This is a Psychologist, occupationalMedical Student Note.  The care of the patient was discussed with Dr. Dimple Caseyice and the assessment and plan formulated with their assistance.  Please see their attached note for official documentation of the daily encounter.   LOS: 5 days   Raoul PitchVerma, Aldred Mase, Medical Student 03/28/2017, 10:43 AM

## 2017-03-28 NOTE — Progress Notes (Signed)
   Subjective: Alison Dodson was resting in bed in a flat position this morning but awoke easily and was interactive. Breakfast was at bedside but had not eaten yet due to awaiting assistance. There were no reported problems with choking since yesterday.  Objective:  Vital signs in last 24 hours: Vitals:   03/27/17 2058 03/28/17 0133 03/28/17 0525 03/28/17 1014  BP: (!) 142/97 (!) 150/98 (!) 152/94 (!) 142/75  Pulse: 75 64 61 76  Resp: 16 16 20 20   Temp: 98.3 F (36.8 C) 97.5 F (36.4 C) 97.4 F (36.3 C) 97.6 F (36.4 C)  TempSrc: Axillary Axillary Oral Oral  SpO2: 99% 100% 100% 97%   GENERAL- elderly woman sleeping in bed in no acute distress easily awakened to voice HEENT- symmetric smile, eyes tracking to both sides CARDIAC- irregular rhythm with normal rate, no murmurs NEURO- she is moving extremity spontaneously but exam limited not following commands, spoke a few single words  Assessment/Plan: #Acute ischemic stroke Her clinical status is the same as yesterday. Do not expect a very impressive recovery from this due to her poor baseline functional status and dementia. I agree she will need skilled nursing level care for treatment really following her stroke likely require long-term care even after therapy has maximized any benefit. Her daughter is interested in discontinuing Plavix because she feels this is contributing to the pill burden and possibly some of her easy bruising. This is reasonable as dual platelet therapy is unlikely to provide a large therapeutic benefit in preventing a repeat embolic stroke.  #Coronary artery disease #Sick sinus syndrome s/p pacemaker #Permanent atrial fibrillation  We will continue daily aspirin therapy alone.Marland Kitchen.  #Alzheimer's dementia Possibly a component of vascular dementia is also contributing. We'll plan to continue her long term meds Seroquel and mirtazapine for her mental status.  #Hypertension She is still mildly elevated on her  lisinopril today. I don't think we need to start additional agent because it on unclear long-term benefit and we are trying to minimize pill burden based on the goals of care as expressed by her daughter.  Dispo: Anticipated discharge to SNF later today  Fuller Planhristopher W Esco Joslyn, MD PGY-II Internal Medicine Resident Pager# [phone] 03/28/2017, 10:46 AM

## 2017-03-29 ENCOUNTER — Non-Acute Institutional Stay (SKILLED_NURSING_FACILITY): Payer: Medicare Other | Admitting: Adult Health

## 2017-03-29 ENCOUNTER — Encounter: Payer: Self-pay | Admitting: Adult Health

## 2017-03-29 DIAGNOSIS — I10 Essential (primary) hypertension: Secondary | ICD-10-CM | POA: Diagnosis not present

## 2017-03-29 DIAGNOSIS — I495 Sick sinus syndrome: Secondary | ICD-10-CM

## 2017-03-29 DIAGNOSIS — R4701 Aphasia: Secondary | ICD-10-CM | POA: Diagnosis not present

## 2017-03-29 DIAGNOSIS — I482 Chronic atrial fibrillation, unspecified: Secondary | ICD-10-CM

## 2017-03-29 DIAGNOSIS — F418 Other specified anxiety disorders: Secondary | ICD-10-CM | POA: Diagnosis not present

## 2017-03-29 DIAGNOSIS — H409 Unspecified glaucoma: Secondary | ICD-10-CM | POA: Diagnosis not present

## 2017-03-29 DIAGNOSIS — I639 Cerebral infarction, unspecified: Secondary | ICD-10-CM | POA: Insufficient documentation

## 2017-03-29 DIAGNOSIS — N390 Urinary tract infection, site not specified: Secondary | ICD-10-CM

## 2017-03-29 DIAGNOSIS — G301 Alzheimer's disease with late onset: Secondary | ICD-10-CM

## 2017-03-29 DIAGNOSIS — F0280 Dementia in other diseases classified elsewhere without behavioral disturbance: Secondary | ICD-10-CM | POA: Diagnosis not present

## 2017-03-29 NOTE — Progress Notes (Signed)
Location:   [facility] Room Number: 121 A Place of Service:  SNF (31)   CODE STATUS: Full Code  Allergies  Allergen Reactions  . Codeine Other (See Comments)    unknown  . Iodine Other (See Comments)    Rash and swelling    Chief Complaint  Patient presents with  . Hospitalization Follow-up    Hospital follow    HPI:  She has been hospitalized for an acute cva. She is aphasic and is unable to participate in the hpi or ros. She does have a history of Alzheimer's disease; afib; hypertension. She has had a gastric bypass in the past. She is here for short term rehab. More than likely this does represent a long term placement for her.    Past Medical History:  Diagnosis Date  . Anxiety   . Coronary artery disease   . Dementia   . Depression   . Dizziness   . Fall   . GERD (gastroesophageal reflux disease)   . Glaucoma   . HX: anticoagulation   . Hypertension   . Osteoarthritis   . Permanent atrial fibrillation (HCC)   . SSS (sick sinus syndrome) (HCC)   . Tachy-brady syndrome [facility])     Past Surgical History:  Procedure Laterality Date  . BREAST REDUCTION SURGERY    . CARDIOVASCULAR STRESS TEST  02/15/2007  . CHOLECYSTECTOMY    . GASTRIC BYPASS    . INSERT / REPLACE / REMOVE PACEMAKER  01/24/2008   DDD PACEMAKER IMPLANT  . PACEMAKER INSERTION    . PERMANENT PACEMAKER GENERATOR CHANGE N/A 01/29/2013   Procedure: PERMANENT PACEMAKER GENERATOR CHANGE;  Surgeon: Hillis Range, MD;  Location: [facility] CATH LAB;  Service: Cardiovascular;  Laterality: N/A;  . TOTAL KNEE ARTHROPLASTY     right  . US ECHOCARDIOGRAPHY  08/10/2007   EF 60%  . US ECHOCARDIOGRAPHY  01/12/2007   EF 70%    Social History   Social History  . Marital status: Widowed    Spouse name: N/A  . Number of children: 2  . Years of education: N/A   Occupational History  . RETIRED Retired   Social History Main Topics  . Smoking status: Former Smoker    Quit date: 07/17/1961  . Smokeless  tobacco: Never Used  . Alcohol use No  . Drug use: No  . Sexual activity: Not on file   Other Topics Concern  . Not on file   Social History Narrative  . No narrative on file   Family History  Problem Relation Age of Onset  . Kidney failure Mother   . Heart attack Father   . Heart attack Sister   . Heart attack Brother       VITAL SIGNS BP (!) 146/74   Pulse 76   Temp 97.4 F (36.3 C)   Resp 20   SpO2 97%   Weight and height not available   Patient's Medications  New Prescriptions   No medications on file  Previous Medications   ACETAMINOPHEN (TYLENOL) 500 MG TABLET    Take 500 mg by mouth every 4 (four) hours as needed for mild pain or headache.    ALUM & MAG HYDROXIDE-SIMETH (MINTOX) 200-200-20 MG/5ML SUSPENSION    Take 30 mLs by mouth every 6 (six) hours as needed for indigestion or heartburn (Not to exceed 4 doses in 24 hours).   ASPIRIN EC 81 MG TABLET    Take 81 mg by mouth daily.   AZELASTINE (OPTIVAR)  0.05 % OPHTHALMIC SOLUTION    Place 1 drop into both eyes 2 (two) times daily.    CARBOXYMETHYLCELLULOSE (LUBRICANT EYE DROPS) 0.5 % SOLN    Place 1 drop into both eyes 4 (four) times daily.    CHOLECALCIFEROL (VITAMIN D) 1000 UNITS TABLET    Take 1,000 Units by mouth daily.   CRANBERRY 450 MG TABS    Take 1 tablet by mouth daily.   LATANOPROST (XALATAN) 0.005 % OPHTHALMIC SOLUTION    Place 1 drop into both eyes at bedtime.    LISINOPRIL (PRINIVIL,ZESTRIL) 20 MG TABLET    Take 1 tablet (20 mg total) by mouth daily. Reported on 02/05/2016   LOPERAMIDE (IMODIUM) 2 MG CAPSULE    Take 2 mg by mouth every 4 (four) hours as needed for diarrhea or loose stools.    MIRTAZAPINE (REMERON) 15 MG TABLET    Take 15 mg by mouth at bedtime.   QUETIAPINE (SEROQUEL) 25 MG TABLET    Take 1 tablet (25 mg total) by mouth 2 (two) times daily. Reported on 02/05/2016  Modified Medications   No medications on file  Discontinued Medications   MENTHOL-ZINC OXIDE (RISAMINE) 0.44-20.625 %  OINT    Apply 1 application topically daily as needed (Apply to sacrum and buttocks after each incontinent episode).   NON FORMULARY    Take 1 Can by mouth 2 (two) times daily. Mighty Shake   SKIN PROTECTANTS, MISC. (BAZA PROTECT EX)    Apply 1 application topically as needed (each incontinence episode).     SIGNIFICANT DIAGNOSTIC EXAMS  03-23-17: chest x-ray: Mild enlargement of the cardiac silhouette without evidence of pulmonary vascular congestion or pulmonary edema. No acute pneumonia. Thoracic aortic atherosclerosis.   03-23-17: ct of head: No acute intracranial abnormality. Atrophy, chronic small vessel disease.  03-24-17: ct of head: Focal hypodensity/ gyral edema in the left posterior frontal lobe with extension into adjacent subcortical and periventricular white matter, suspicious for an area of infarct. There is no hemorrhage or significant mass effect. Atrophy with extensive white matter small vessel ischemic changes.    LABS REVIEWED:   03-23-17: wbc 6.2; hgb 14.6; hct 44.;5 mcv 89.0; plt 217; glucose 104; bun 15; create 1.02; k+ 4.1 ;na++ 141; liver normal albumin 3.7 03-24-17: hgb a1 5.3; chol 122; ldl 73; trig 72; hdl 35     Review of Systems  Unable to perform ROS: Other (aphasia from cva)    Physical Exam  Constitutional: She appears well-developed and well-nourished. No distress.  Eyes: Conjunctivae are normal.  Neck: Neck supple. No JVD present. No thyromegaly present.  Cardiovascular: Normal rate, regular rhythm and intact distal pulses.   Respiratory: Effort normal and breath sounds normal. No respiratory distress. She has no wheezes.  GI: Soft. Bowel sounds are normal. She exhibits no distension. There is no tenderness.  Musculoskeletal: She exhibits no edema.  She has gross movement of her right upper extremity did not grip right hand Is able to move left upper extremity Is able to move both lower extremities with right lower extremity weak  Lymphadenopathy:     She has no cervical adenopathy.  Neurological: She is alert.  Skin: Skin is warm and dry. She is not diaphoretic.  Has bruising on both arms      ASSESSMENT/ PLAN:  1. Hypertension: b/p: 146/74: will continue lisinopril 20 mg daily asa 81 mg daily   2. Chronic afib: has bradycardia/tachycardia syndrome: EF 55-60% (02-06-16): is status post DDD pacemaker insertion: heart rate  stable; will continue asa 81 mg daily. Her family has opted not to have any further anticoagulation therapy due to bruising and history of falls.   3. CVA: will continue asa 81 mg daily  Lipid panel essentially normal; will continue therapy as directed to improve upon mobility gait and adl training  4. 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 Alzheimer's disease: her aricept was stopped in the hospital will continue to monitor her status.   6. UTI: is on chronic cranberry. No report of recent uti  7. Depression with anxiety: will continue remeron 15 mg nightly and will continue seroquel 25 mg twice daily and will monitor  8. Glaucoma: will continue optivar to both eyes twice daily xalatan to both eyes nightly    Time spent with patient  50  minutes >50% time spent counseling; reviewing medical record; tests; labs; and developing future plan of care    MD is aware of resident's narcotic use and is in agreement with current plan of care. We will attempt to wean resident as apropriate   Synthia Innocenteborah Bela Nyborg NP [facility]  Contact [phone] Monday through Friday 8am- 5pm  After hours call [phone]

## 2017-04-06 ENCOUNTER — Non-Acute Institutional Stay (SKILLED_NURSING_FACILITY): Payer: Medicare Other | Admitting: Internal Medicine

## 2017-04-06 ENCOUNTER — Encounter: Payer: Self-pay | Admitting: Internal Medicine

## 2017-04-06 DIAGNOSIS — F418 Other specified anxiety disorders: Secondary | ICD-10-CM | POA: Diagnosis not present

## 2017-04-06 DIAGNOSIS — I482 Chronic atrial fibrillation, unspecified: Secondary | ICD-10-CM

## 2017-04-06 DIAGNOSIS — I69320 Aphasia following cerebral infarction: Secondary | ICD-10-CM | POA: Diagnosis not present

## 2017-04-06 DIAGNOSIS — G301 Alzheimer's disease with late onset: Secondary | ICD-10-CM

## 2017-04-06 DIAGNOSIS — N390 Urinary tract infection, site not specified: Secondary | ICD-10-CM

## 2017-04-06 DIAGNOSIS — Z8673 Personal history of transient ischemic attack (TIA), and cerebral infarction without residual deficits: Secondary | ICD-10-CM

## 2017-04-06 DIAGNOSIS — F0280 Dementia in other diseases classified elsewhere without behavioral disturbance: Secondary | ICD-10-CM

## 2017-04-06 DIAGNOSIS — I10 Essential (primary) hypertension: Secondary | ICD-10-CM

## 2017-04-06 NOTE — Progress Notes (Signed)
Patient ID: Alison Dodson, female   DOB: 27-Mar-1928, 83 y.o.   MRN: [id number]    HISTORY AND PHYSICAL   DATE: 04/06/2017 Location:    [facility] Room Number: 121 A Place of Service: SNF (31)   Extended Emergency Contact Information Primary Emergency Contact: Sullivan,Judy Address: [address]          [place], [place] [address] [place] Phone: [phone] Mobile Phone: [phone] Relation: Daughter  Advanced Directive information Does Patient Have a Medical Advance Directive?: Yes, Type of Advance Directive: Out of facility DNR (pink MOST or yellow form), Pre-existing out of facility DNR order (yellow form or pink MOST form): Pink MOST form placed in chart (order not valid for inpatient use);Yellow form placed in chart (order not valid for inpatient use)  Chief Complaint  Patient presents with  . New Admit To SNF    Admission    HPI:  83 yo female seen today as a new admission into SNF following hospital stay for acute ischemic CVA, Alzheimer's dementia, chronic afib, HTN. CXR neg. Initial CT head showed atrophy and chronic small vessel disease but no acute process. Repeat CT head next day showed focal hypodensity and gyral edema in the left posterior frontal lobe suspicious for infarct. meds adjusted to reduce pill burden. Family declined anticoagulation. Swallowing study revealed attentional choking. A1c 5.3%; LDL 73; Cr 1.00; albumin 3.7; Hgb 14.6 at d/c  Today she has no concerns. She is a poor historian due to dementia. Hx obtained from chart. No nursing issues. No falls.  Hypertension - BP stable on lisinopril 20 mg daily; ASA  81 mg daily   Chronic afib with bradycardia/tachycardia syndrome - EF 55-60% (02-06-16). She is s/p DDD pacemaker insertion. Rate controlled. Takes ASA 81 mg daily. Her family has opted not to have any further anticoagulation therapy due to bruising and history of falls.   Hx CVA - takes ASA 81 mg daily. Lipid panel  essentially normal. She has Aphasia due to CVA and  attentional dysphagia   Alzheimer's disease - no longer on aricept as it was stopped in the hospital   Recurrent UTI - she takes chronic cranberry tabs. No recent UTI  Depression with anxiety - stable on remeron 15 mg nightly; seroquel 25 mg twice daily   Glaucoma - stable on optivar to both eyes twice daily; xalatan to both eyes nightly    Past Medical History:  Diagnosis Date  . Anxiety   . Coronary artery disease   . Dementia   . Depression   . Dizziness   . Fall   . GERD (gastroesophageal reflux disease)   . Glaucoma   . HX: anticoagulation   . Hypertension   . Osteoarthritis   . Permanent atrial fibrillation ([place])   . SSS (sick sinus syndrome) ([place])   . Tachy-brady syndrome [facility])     Past Surgical History:  Procedure Laterality Date  . BREAST REDUCTION SURGERY    . CARDIOVASCULAR STRESS TEST  02/15/2007  . CHOLECYSTECTOMY    . GASTRIC BYPASS    . INSERT / REPLACE / REMOVE PACEMAKER  01/24/2008   DDD PACEMAKER IMPLANT  . PACEMAKER INSERTION    . PERMANENT PACEMAKER GENERATOR CHANGE N/A 01/29/2013   Procedure: PERMANENT PACEMAKER GENERATOR CHANGE;  Surgeon: Thompson Grayer, MD;  Location: [facility] CATH LAB;  Service: Cardiovascular;  Laterality: N/A;  . TOTAL KNEE ARTHROPLASTY     right  . US ECHOCARDIOGRAPHY  08/10/2007   EF 60%  .  US ECHOCARDIOGRAPHY  01/12/2007   EF 70%    Patient Care Team: Sande Brothers, MD as PCP - General (Internal Medicine) Thompson Grayer, MD as Attending Physician (Cardiology)  Social History   Social History  . Marital status: Widowed    Spouse name: N/A  . Number of children: 2  . Years of education: N/A   Occupational History  . RETIRED Retired   Social History Main Topics  . Smoking status: Former Smoker    Quit date: 07/17/1961  . Smokeless tobacco: Never Used  . Alcohol use No  . Drug use: No  . Sexual activity: Not on file   Other Topics Concern  . Not on file   Social  History Narrative  . No narrative on file     reports that she quit smoking about 55 years ago. She has never used smokeless tobacco. She reports that she does not drink alcohol or use drugs.  Family History  Problem Relation Age of Onset  . Kidney failure Mother   . Heart attack Father   . Heart attack Sister   . Heart attack Brother    Family Status  Relation Status  . Mother Deceased at age 29  . Father Deceased at age 68  . Sister Deceased  . Brother Deceased  . Brother Deceased  . Brother Deceased  . Brother Deceased  . Brother Deceased  . Brother Deceased  . Sister Deceased  . Sister Deceased  . Sister Deceased    Immunization History  Administered Date(s) Administered  . PPD Test 04/05/2017  . Td 12/23/2013    Allergies  Allergen Reactions  . Codeine Other (See Comments)    unknown  . Iodine Other (See Comments)    Rash and swelling    Medications: Patient's Medications  New Prescriptions   No medications on file  Previous Medications   ACETAMINOPHEN (TYLENOL) 500 MG TABLET    Take 500 mg by mouth every 4 (four) hours as needed for mild pain or headache.    ALUM & MAG HYDROXIDE-SIMETH (MINTOX) 846-962-95 MG/5ML SUSPENSION    Take 30 mLs by mouth every 6 (six) hours as needed for indigestion or heartburn (Not to exceed 4 doses in 24 hours).   ASPIRIN EC 81 MG TABLET    Take 81 mg by mouth daily.   AZELASTINE (OPTIVAR) 0.05 % OPHTHALMIC SOLUTION    Place 1 drop into both eyes 2 (two) times daily.    CARBOXYMETHYLCELLULOSE (LUBRICANT EYE DROPS) 0.5 % SOLN    Place 1 drop into both eyes 4 (four) times daily.    CHOLECALCIFEROL (VITAMIN D) 1000 UNITS TABLET    Take 1,000 Units by mouth daily.   CRANBERRY 450 MG TABS    Take 1 tablet by mouth daily.   LATANOPROST (XALATAN) 0.005 % OPHTHALMIC SOLUTION    Place 1 drop into both eyes at bedtime.    LISINOPRIL (PRINIVIL,ZESTRIL) 20 MG TABLET    Take 1 tablet (20 mg total) by mouth daily. Reported on 02/05/2016    LOPERAMIDE (IMODIUM) 2 MG CAPSULE    Take 2 mg by mouth every 4 (four) hours as needed for diarrhea or loose stools.    MIRTAZAPINE (REMERON) 15 MG TABLET    Take 15 mg by mouth at bedtime.   QUETIAPINE (SEROQUEL) 25 MG TABLET    Take 1 tablet (25 mg total) by mouth 2 (two) times daily. Reported on 02/05/2016  Modified Medications   No medications on file  Discontinued Medications  No medications on file    Review of Systems  Unable to perform ROS: Dementia    Vitals:   04/06/17 1125  BP: (!) 147/94  Pulse: 63  Resp: 20  Temp: 97 F (36.1 C)  TempSrc: Oral  SpO2: 97%  Weight: 130 lb 9.6 oz (59.2 kg)  Height: '5\' 5"'  (1.651 m)   Body mass index is 21.73 kg/m.  Physical Exam  Constitutional: She appears well-developed.  Frail appearing, sitting in w/c in NAD  HENT:  Mouth/Throat: Oropharynx is clear and moist. No oropharyngeal exudate.  Eyes: Pupils are equal, round, and reactive to light. No scleral icterus.  Neck: Neck supple. Carotid bruit is not present. No tracheal deviation present. No thyromegaly present.  Cardiovascular: Normal rate and intact distal pulses.  An irregularly irregular rhythm present. Exam reveals no gallop and no friction rub.   Murmur heard.  Systolic murmur is present with a grade of 1/6  No LE edema b/l. No calf TTP  Pulmonary/Chest: Effort normal and breath sounds normal. No stridor. No respiratory distress. She has no wheezes. She has no rales.  Abdominal: Soft. Bowel sounds are normal. She exhibits no distension and no mass. There is no hepatomegaly. There is no tenderness. There is no rebound and no guarding.  Musculoskeletal: She exhibits edema.  Lymphadenopathy:    She has no cervical adenopathy.  Neurological: She is alert.  Skin: Skin is warm and dry. No rash noted.  Skin tears/abrasions on both hands - dsg c/d/i. Senile purpura noted  Psychiatric: She has a normal mood and affect. Her behavior is normal.     Labs reviewed: Admission  on 03/23/2017, Discharged on 03/28/2017  Component Date Value Ref Range Status  . Sodium 03/23/2017 143  135 - 145 mmol/L Final  . Potassium 03/23/2017 4.3  3.5 - 5.1 mmol/L Final  . Chloride 03/23/2017 107  101 - 111 mmol/L Final  . BUN 03/23/2017 20  6 - 20 mg/dL Final  . Creatinine, Ser 03/23/2017 1.00  0.44 - 1.00 mg/dL Final  . Glucose, Bld 03/23/2017 98  65 - 99 mg/dL Final  . Calcium, Ion 03/23/2017 1.17  1.15 - 1.40 mmol/L Final  . TCO2 03/23/2017 27  0 - 100 mmol/L Final  . Hemoglobin 03/23/2017 15.6* 12.0 - 15.0 g/dL Final  . HCT 03/23/2017 46.0  36.0 - 46.0 % Final  . Prothrombin Time 03/23/2017 15.5* 11.4 - 15.2 seconds Final  . INR 03/23/2017 1.23   Final  . aPTT 03/23/2017 29  24 - 36 seconds Final  . WBC 03/23/2017 6.2  4.0 - 10.5 K/uL Final  . RBC 03/23/2017 5.00  3.87 - 5.11 MIL/uL Final  . Hemoglobin 03/23/2017 14.6  12.0 - 15.0 g/dL Final  . HCT 03/23/2017 44.5  36.0 - 46.0 % Final  . MCV 03/23/2017 89.0  78.0 - 100.0 fL Final  . MCH 03/23/2017 29.2  26.0 - 34.0 pg Final  . MCHC 03/23/2017 32.8  30.0 - 36.0 g/dL Final  . RDW 03/23/2017 14.8  11.5 - 15.5 % Final  . Platelets 03/23/2017 217  150 - 400 K/uL Final  . Neutrophils Relative % 03/23/2017 70  % Final  . Neutro Abs 03/23/2017 4.3  1.7 - 7.7 K/uL Final  . Lymphocytes Relative 03/23/2017 25  % Final  . Lymphs Abs 03/23/2017 1.5  0.7 - 4.0 K/uL Final  . Monocytes Relative 03/23/2017 4  % Final  . Monocytes Absolute 03/23/2017 0.3  0.1 - 1.0 K/uL Final  .  Eosinophils Relative 03/23/2017 1  % Final  . Eosinophils Absolute 03/23/2017 0.1  0.0 - 0.7 K/uL Final  . Basophils Relative 03/23/2017 0  % Final  . Basophils Absolute 03/23/2017 0.0  0.0 - 0.1 K/uL Final  . Sodium 03/23/2017 141  135 - 145 mmol/L Final  . Potassium 03/23/2017 4.1  3.5 - 5.1 mmol/L Final  . Chloride 03/23/2017 110  101 - 111 mmol/L Final  . CO2 03/23/2017 25  22 - 32 mmol/L Final  . Glucose, Bld 03/23/2017 104* 65 - 99 mg/dL Final  .  BUN 03/23/2017 15  6 - 20 mg/dL Final  . Creatinine, Ser 03/23/2017 1.02* 0.44 - 1.00 mg/dL Final  . Calcium 03/23/2017 9.5  8.9 - 10.3 mg/dL Final  . Total Protein 03/23/2017 6.7  6.5 - 8.1 g/dL Final  . Albumin 03/23/2017 3.7  3.5 - 5.0 g/dL Final  . AST 03/23/2017 25  15 - 41 U/L Final  . ALT 03/23/2017 12* 14 - 54 U/L Final  . Alkaline Phosphatase 03/23/2017 109  38 - 126 U/L Final  . Total Bilirubin 03/23/2017 1.0  0.3 - 1.2 mg/dL Final  . GFR calc non Af Amer 03/23/2017 48* >60 mL/min Final  . GFR calc Af Amer 03/23/2017 56* >60 mL/min Final   Comment: (NOTE) The eGFR has been calculated using the CKD EPI equation. This calculation has not been validated in all clinical situations. eGFR's persistently <60 mL/min signify possible Chronic Kidney Disease.   . Anion gap 03/23/2017 6  5 - 15 Final  . Troponin i, poc 03/23/2017 0.00  0.00 - 0.08 ng/mL Final  . Comment 3 03/23/2017          Final   Comment: Due to the release kinetics of cTnI, a negative result within the first hours of the onset of symptoms does not rule out myocardial infarction with certainty. If myocardial infarction is still suspected, repeat the test at appropriate intervals.   . Color, Urine 03/23/2017 YELLOW  YELLOW Final  . APPearance 03/23/2017 CLEAR  CLEAR Final  . Specific Gravity, Urine 03/23/2017 1.012  1.005 - 1.030 Final  . pH 03/23/2017 7.0  5.0 - 8.0 Final  . Glucose, UA 03/23/2017 NEGATIVE  NEGATIVE mg/dL Final  . Hgb urine dipstick 03/23/2017 NEGATIVE  NEGATIVE Final  . Bilirubin Urine 03/23/2017 NEGATIVE  NEGATIVE Final  . Ketones, ur 03/23/2017 NEGATIVE  NEGATIVE mg/dL Final  . Protein, ur 03/23/2017 NEGATIVE  NEGATIVE mg/dL Final  . Nitrite 03/23/2017 NEGATIVE  NEGATIVE Final  . Leukocytes, UA 03/23/2017 NEGATIVE  NEGATIVE Final  . Hgb A1c MFr Bld 03/24/2017 5.3  4.8 - 5.6 % Final   Comment: (NOTE)         Pre-diabetes: 5.7 - 6.4         Diabetes: >6.4         Glycemic control for  adults with diabetes: <7.0   . Mean Plasma Glucose 03/24/2017 105  mg/dL Final   Comment: (NOTE) Performed At: [facility] [place], [place] [id number] Lindon Romp MD [id number]   . Cholesterol 03/24/2017 122  0 - 200 mg/dL Final  . Triglycerides 03/24/2017 72  <150 mg/dL Final  . HDL 03/24/2017 35* >40 mg/dL Final  . Total CHOL/HDL Ratio 03/24/2017 3.5  RATIO Final  . VLDL 03/24/2017 14  0 - 40 mg/dL Final  . LDL Cholesterol 03/24/2017 73  0 - 99 mg/dL Final   Comment:  Total Cholesterol/HDL:CHD Risk Coronary Heart Disease Risk Table                     Men   Women  1/2 Average Risk   3.4   3.3  Average Risk       5.0   4.4  2 X Average Risk   9.6   7.1  3 X Average Risk  23.4   11.0        Use the calculated Patient Ratio above and the CHD Risk Table to determine the patient's CHD Risk.        ATP III CLASSIFICATION (LDL):  <100     mg/dL   Optimal  100-129  mg/dL   Near or Above                    Optimal  130-159  mg/dL   Borderline  160-189  mg/dL   High  >190     mg/dL   Very High     Dg Chest 2 View  Result Date: 03/23/2017 CLINICAL DATA:  Stroke symptoms. History of coronary artery disease, atrial fibrillation with tachy Brady syndrome, former smoker. EXAM: CHEST  2 VIEW COMPARISON:  Chest x-ray of February 05, 2016. FINDINGS: The lungs are adequately inflated. There is no focal infiltrate. There is no pleural effusion. The heart is mildly enlarged. The pulmonary vascularity is normal. There is calcification in the wall of the aortic arch. The ICD is in stable position. There are degenerative changes of both shoulders. IMPRESSION: Mild enlargement of the cardiac silhouette without evidence of pulmonary vascular congestion or pulmonary edema. No acute pneumonia. Thoracic aortic atherosclerosis. Electronically Signed   By: David  Martinique M.D.   On: 03/23/2017 11:22   Ct Head Wo Contrast  Result Date: 03/24/2017 CLINICAL DATA:   Right-sided facial droop EXAM: CT HEAD WITHOUT CONTRAST TECHNIQUE: Contiguous axial images were obtained from the base of the skull through the vertex without intravenous contrast. COMPARISON:  03/23/2017 FINDINGS: Brain: Focal hypodensity within the left posterior frontal lobe with gyral edema, this appears to extend to the adjacent periventricular white matter and is suspicious for a focus of ischemia. There is no hemorrhage. No significant mass effect. Extensive periventricular and subcortical small vessel ischemic changes. Moderate generalized atrophy. Ventricles stable in size. Vascular: Carotid artery calcifications. Somewhat dense left MCA but similar compared to prior studies. Skull: No fracture or suspicious bone lesion. Left mastoid sclerosis. Sinuses/Orbits: Mild mucosal thickening in the ethmoid sinuses. No acute orbital abnormality. Bilateral lens extraction Other: None IMPRESSION: Focal hypodensity/ gyral edema in the left posterior frontal lobe with extension into adjacent subcortical and periventricular white matter, suspicious for an area of infarct. There is no hemorrhage or significant mass effect. Atrophy with extensive white matter small vessel ischemic changes. Electronically Signed   By: Donavan Foil M.D.   On: 03/24/2017 01:29   Ct Head Wo Contrast  Result Date: 03/23/2017 CLINICAL DATA:  Stroke-like symptoms EXAM: CT HEAD WITHOUT CONTRAST TECHNIQUE: Contiguous axial images were obtained from the base of the skull through the vertex without intravenous contrast. COMPARISON:  03/10/2016 FINDINGS: Brain: There is atrophy and chronic small vessel disease changes. No acute intracranial abnormality. Specifically, no hemorrhage, hydrocephalus, mass lesion, acute infarction, or significant intracranial injury. Old left cerebellar infarct, stable Vascular: No hyperdense vessel or unexpected calcification. Skull: No acute calvarial abnormality. Sinuses/Orbits: Visualized paranasal sinuses and  mastoids clear. Orbital soft tissues unremarkable. Other: None IMPRESSION: No acute  intracranial abnormality. Atrophy, chronic small vessel disease. Electronically Signed   By: Rolm Baptise M.D.   On: 03/23/2017 11:30     Assessment/Plan   ICD-9-CM ICD-10-CM   1. History of recent stroke V12.54 Z86.73   2. Aphasia due to recent stroke 438.11 I69.320   3. Chronic atrial fibrillation (HCC) 427.31 I48.2   4. Essential hypertension, benign 401.1 I10   5. Late onset Alzheimer's disease without behavioral disturbance 331.0 G30.1    294.10 F02.80   6. Recurrent UTI 599.0 N39.0   7. Depression with anxiety 300.4 F41.8     Cont current meds as ordered  PT/Ot/ST as ordered  Fall precautions  F/u with specialists as indicated  GOAL: short term rehab with potential for long term care. Communicated with pt and nursing.  Will follow  Bayani Renteria S. Perlie Gold  [facility] [address] [place], [place] [address] [phone] Cell (Monday-Friday 8 AM - 5 PM) [phone] After 5 PM and follow prompts

## 2017-04-19 ENCOUNTER — Encounter: Payer: Self-pay | Admitting: Adult Health

## 2017-04-19 ENCOUNTER — Non-Acute Institutional Stay (SKILLED_NURSING_FACILITY): Payer: Medicare Other | Admitting: Adult Health

## 2017-04-19 DIAGNOSIS — D699 Hemorrhagic condition, unspecified: Secondary | ICD-10-CM

## 2017-04-19 DIAGNOSIS — Y92129 Unspecified place in nursing home as the place of occurrence of the external cause: Secondary | ICD-10-CM | POA: Diagnosis not present

## 2017-04-19 DIAGNOSIS — R233 Spontaneous ecchymoses: Secondary | ICD-10-CM

## 2017-04-19 DIAGNOSIS — W19XXXA Unspecified fall, initial encounter: Secondary | ICD-10-CM | POA: Diagnosis not present

## 2017-04-19 NOTE — Progress Notes (Signed)
Location:   [facility] Room Number: 121A Place of Service:  SNF (31)   CODE STATUS: full code   Allergies  Allergen Reactions  . Codeine Other (See Comments)    unknown  . Iodine Other (See Comments)    Rash and swelling    Chief Complaint  Patient presents with  . Acute Visit    status post fall     HPI:  She has had a fall without apparent injury. Her family is present and is upset about the bruising she is having. She has extensive bruising on her bilateral arms; and legs. Her skin is fragile and easily bruises. She would benefit from geri sleeves to help protect her skin.   Past Medical History:  Diagnosis Date  . Anxiety   . Coronary artery disease   . Dementia   . Depression   . Dizziness   . Fall   . GERD (gastroesophageal reflux disease)   . Glaucoma   . HX: anticoagulation   . Hypertension   . Osteoarthritis   . Permanent atrial fibrillation (HCC)   . SSS (sick sinus syndrome) (HCC)   . Tachy-brady syndrome [facility](HCC)     Past Surgical History:  Procedure Laterality Date  . BREAST REDUCTION SURGERY    . CARDIOVASCULAR STRESS TEST  02/15/2007  . CHOLECYSTECTOMY    . GASTRIC BYPASS    . INSERT / REPLACE / REMOVE PACEMAKER  01/24/2008   DDD PACEMAKER IMPLANT  . PACEMAKER INSERTION    . PERMANENT PACEMAKER GENERATOR CHANGE N/A 01/29/2013   Procedure: PERMANENT PACEMAKER GENERATOR CHANGE;  Surgeon: Hillis RangeJames Allred, MD;  [facility];  Service: Cardiovascular;  Laterality: N/A;  . TOTAL KNEE ARTHROPLASTY     right  . US ECHOCARDIOGRAPHY  08/10/2007   EF 60%  . US ECHOCARDIOGRAPHY  01/12/2007   EF 70%    Social History   Social History  . Marital status: Widowed    Spouse name: N/A  . Number of children: 2  . Years of education: N/A   Occupational History  . RETIRED Retired   Social History Main Topics  . Smoking status: Former Smoker    Quit date: 07/17/1961  . Smokeless tobacco: Never Used  . Alcohol use No  . Drug use: No    . Sexual activity: No   Other Topics Concern  . Not on file   Social History Narrative  . No narrative on file   Family History  Problem Relation Age of Onset  . Kidney failure Mother   . Heart attack Father   . Heart attack Sister   . Heart attack Brother       VITAL SIGNS BP 128/84   Pulse 78   Temp 98.2 F (36.8 C)   Resp 18   Ht 5\' 5"  (1.651 m)   Wt 130 lb 9.6 oz (59.2 kg)   SpO2 97%   BMI 21.73 kg/m   Patient's Medications  New Prescriptions   No medications on file  Previous Medications   ACETAMINOPHEN (TYLENOL) 500 MG TABLET    Take 500 mg by mouth every 4 (four) hours as needed for mild pain or headache.    ALUM & MAG HYDROXIDE-SIMETH (MINTOX) 200-200-20 MG/5ML SUSPENSION    Take 30 mLs by mouth every 6 (six) hours as needed for indigestion or heartburn (Not to exceed 4 doses in 24 hours).   ASPIRIN EC 81 MG TABLET    Take 81 mg by mouth daily.  AZELASTINE (OPTIVAR) 0.05 % OPHTHALMIC SOLUTION    Place 1 drop into both eyes 2 (two) times daily.    CARBOXYMETHYLCELLULOSE (LUBRICANT EYE DROPS) 0.5 % SOLN    Place 1 drop into both eyes 4 (four) times daily.    CHOLECALCIFEROL (VITAMIN D) 1000 UNITS TABLET    Take 1,000 Units by mouth daily.   CRANBERRY 450 MG TABS    Take 1 tablet by mouth daily.   LATANOPROST (XALATAN) 0.005 % OPHTHALMIC SOLUTION    Place 1 drop into both eyes at bedtime.    LISINOPRIL (PRINIVIL,ZESTRIL) 20 MG TABLET    Take 1 tablet (20 mg total) by mouth daily. Reported on 02/05/2016   LOPERAMIDE (IMODIUM) 2 MG CAPSULE    Take 2 mg by mouth every 4 (four) hours as needed for diarrhea or loose stools.    MIRTAZAPINE (REMERON) 15 MG TABLET    Take 15 mg by mouth at bedtime.   QUETIAPINE (SEROQUEL) 25 MG TABLET    Take 1 tablet (25 mg total) by mouth 2 (two) times daily. Reported on 02/05/2016  Modified Medications   No medications on file  Discontinued Medications   No medications on file     SIGNIFICANT DIAGNOSTIC EXAMS   03-23-17: chest  x-ray: Mild enlargement of the cardiac silhouette without evidence of pulmonary vascular congestion or pulmonary edema. No acute pneumonia. Thoracic aortic atherosclerosis.   03-23-17: ct of head: No acute intracranial abnormality. Atrophy, chronic small vessel disease.  03-24-17: ct of head: Focal hypodensity/ gyral edema in the left posterior frontal lobe with extension into adjacent subcortical and periventricular white matter, suspicious for an area of infarct. There is no hemorrhage or significant mass effect. Atrophy with extensive white matter small vessel ischemic changes.    LABS REVIEWED:   03-23-17: wbc 6.2; hgb 14.6; hct 44.;5 mcv 89.0; plt 217; glucose 104; bun 15; create 1.02; k+ 4.1 ;na++ 141; liver normal albumin 3.7 03-24-17: hgb a1 5.3; chol 122; ldl 73; trig 72; hdl 35     Review of Systems  Unable to perform ROS: Other (aphasia from cva)    Physical Exam  Constitutional: She appears well-developed and well-nourished. No distress.  Eyes: Conjunctivae are normal.  Neck: Neck supple. No JVD present. No thyromegaly present.  Cardiovascular: Normal rate, regular rhythm and intact distal pulses.   Respiratory: Effort normal and breath sounds normal. No respiratory distress. She has no wheezes.  GI: Soft. Bowel sounds are normal. She exhibits no distension. There is no tenderness.  Musculoskeletal: She exhibits no edema.  Is able to move all extremities Lymphadenopathy:    She has no cervical adenopathy.  Neurological: She is alert.  Skin: Skin is warm and dry. She is not diaphoretic Skin is fragile.  Has bruising on both arms right hand  and both lower extremities      ASSESSMENT/ PLAN:  1. Bruising  2. Status post fall  Will pad her bed rails to help protect her from further bruising Will have her wear geri sleeves to help protect her from bruising Will have therapy evaluate and treat for positioning in the wheelchair.     Time spent with patient  40  minutes  >50% time spent counseling; reviewing medical record; tests; labs; and developing future plan of care   MD is aware of resident's narcotic use and is in agreement with current plan of care. We will attempt to wean resident as apropriate   Synthia Innocent NP [facility]  Contact [phone]  Monday through Friday 8am- 5pm  After hours call [phone]   

## 2017-04-21 LAB — CBC AND DIFFERENTIAL
HEMATOCRIT: 44 (ref 36–46)
HEMOGLOBIN: 14.7 (ref 12.0–16.0)
Neutrophils Absolute: 4
PLATELETS: 280 (ref 150–399)
WBC: 7.4

## 2017-05-08 ENCOUNTER — Encounter: Payer: Self-pay | Admitting: Internal Medicine

## 2017-05-08 ENCOUNTER — Non-Acute Institutional Stay (SKILLED_NURSING_FACILITY): Payer: Medicare Other | Admitting: Internal Medicine

## 2017-05-08 DIAGNOSIS — F0280 Dementia in other diseases classified elsewhere without behavioral disturbance: Secondary | ICD-10-CM | POA: Diagnosis not present

## 2017-05-08 DIAGNOSIS — I480 Paroxysmal atrial fibrillation: Secondary | ICD-10-CM | POA: Diagnosis not present

## 2017-05-08 DIAGNOSIS — G301 Alzheimer's disease with late onset: Secondary | ICD-10-CM | POA: Diagnosis not present

## 2017-05-08 DIAGNOSIS — I10 Essential (primary) hypertension: Secondary | ICD-10-CM | POA: Diagnosis not present

## 2017-05-08 DIAGNOSIS — Z8673 Personal history of transient ischemic attack (TIA), and cerebral infarction without residual deficits: Secondary | ICD-10-CM

## 2017-05-08 DIAGNOSIS — F418 Other specified anxiety disorders: Secondary | ICD-10-CM | POA: Diagnosis not present

## 2017-05-08 NOTE — Progress Notes (Signed)
DATE:  May 08, 2017  Location:   [facility] Room Number: 121 A Place of Service: SNF (31)   Extended Emergency Contact Information Primary Emergency Contact: Sullivan,Judy Address: [address]          [place], [place] [address] Johnnette Litter of [place] Phone: [phone] Mobile Phone: [phone] Relation: Daughter  Advanced Directive information Does Patient Have a Medical Advance Directive?: Yes, Would patient like information on creating a medical advance directive?: No - Patient declined, Type of Advance Directive: Out of facility DNR (pink MOST or yellow form), Pre-existing out of facility DNR order (yellow form or pink MOST form): Yellow form placed in chart (order not valid for inpatient use);Pink MOST form placed in chart (order not valid for inpatient use)  Chief Complaint  Patient presents with  . Medical Management of Chronic Issues    1 month follow up    HPI:  83 yo female long term resident seen today for f/u. She has no concerns today. No nursing issues. No falls. Appetite ok and sleeps well. She is a poor historian due to dementia. Hx obtained from chart.  Hypertension - stable on lisinopril 20 mg daily; ASA  81 mg daily for anticoagulation.    Chronic afib with bradycardia/tachycardia syndrome - EF 55-60% (02-06-16). Rate controlled s/p DDD pacemaker insertion.Takes ASA 81 mg daily. Her family has opted not to have any further anticoagulation therapy due to bruising and history of falls.   Hx CVA - takes ASA 81 mg daily. Lipid panel essentially normal. She has Aphasia due to CVA and  attentional dysphagia. No new sx's  Alzheimer's disease - no longer on aricept as it was stopped in the hospital. She takes seroquel to stabilize mood  Recurrent UTI - stable. she takes chronic cranberry tabs.   Depression with anxiety - stable on remeron 15 mg nightly; seroquel 25 mg twice daily. She does have benefit from tx  Glaucoma - stable on optivar to  both eyes twice daily; xalatan to both eyes nightly    Past Medical History:  Diagnosis Date  . Anxiety   . Coronary artery disease   . Dementia   . Depression   . Dizziness   . Fall   . GERD (gastroesophageal reflux disease)   . Glaucoma   . HX: anticoagulation   . Hypertension   . Osteoarthritis   . Permanent atrial fibrillation ([place])   . SSS (sick sinus syndrome) ([place])   . Tachy-brady syndrome [facility])     Past Surgical History:  Procedure Laterality Date  . BREAST REDUCTION SURGERY    . CARDIOVASCULAR STRESS TEST  02/15/2007  . CHOLECYSTECTOMY    . GASTRIC BYPASS    . INSERT / REPLACE / REMOVE PACEMAKER  01/24/2008   DDD PACEMAKER IMPLANT  . PACEMAKER INSERTION    . PERMANENT PACEMAKER GENERATOR CHANGE N/A 01/29/2013   Procedure: PERMANENT PACEMAKER GENERATOR CHANGE;  Surgeon: Thompson Grayer, MD;  Location: [facility] CATH LAB;  Service: Cardiovascular;  Laterality: N/A;  . TOTAL KNEE ARTHROPLASTY     right  . US ECHOCARDIOGRAPHY  08/10/2007   EF 60%  . US ECHOCARDIOGRAPHY  01/12/2007   EF 70%    Patient Care Team: Sande Brothers, MD as PCP - General (Internal Medicine) Thompson Grayer, MD as Attending Physician (Cardiology)  Social History   Social History  . Marital status: Widowed    Spouse name: N/A  . Number of children: 2  . Years of education: N/A  Occupational History  . RETIRED Retired   Social History Main Topics  . Smoking status: Former Smoker    Quit date: 07/17/1961  . Smokeless tobacco: Never Used  . Alcohol use No  . Drug use: No  . Sexual activity: No   Other Topics Concern  . Not on file   Social History Narrative  . No narrative on file     reports that she quit smoking about 55 years ago. She has never used smokeless tobacco. She reports that she does not drink alcohol or use drugs.  Family History  Problem Relation Age of Onset  . Kidney failure Mother   . Heart attack Father   . Heart attack Sister   . Heart attack Brother    Family  Status  Relation Status  . Mother Deceased at age 55  . Father Deceased at age 11  . Sister Deceased  . Brother Deceased  . Brother Deceased  . Brother Deceased  . Brother Deceased  . Brother Deceased  . Brother Deceased  . Sister Deceased  . Sister Deceased  . Sister Deceased    Immunization History  Administered Date(s) Administered  . PPD Test 04/05/2017  . Td 12/23/2013    Allergies  Allergen Reactions  . Codeine Other (See Comments)    unknown  . Iodine Other (See Comments)    Rash and swelling    Medications: Patient's Medications  New Prescriptions   No medications on file  Previous Medications   ACETAMINOPHEN (TYLENOL) 500 MG TABLET    Take 500 mg by mouth every 4 (four) hours as needed for mild pain or headache.    ALUM & MAG HYDROXIDE-SIMETH (MINTOX) 233-007-62 MG/5ML SUSPENSION    Take 30 mLs by mouth every 6 (six) hours as needed for indigestion or heartburn (Not to exceed 4 doses in 24 hours).   ASPIRIN EC 81 MG TABLET    Take 81 mg by mouth daily.   AZELASTINE (OPTIVAR) 0.05 % OPHTHALMIC SOLUTION    Place 1 drop into both eyes 2 (two) times daily.    CARBOXYMETHYLCELLULOSE (LUBRICANT EYE DROPS) 0.5 % SOLN    Place 1 drop into both eyes 4 (four) times daily.    CHOLECALCIFEROL (VITAMIN D) 1000 UNITS TABLET    Take 1,000 Units by mouth daily.   CRANBERRY 450 MG TABS    Take 1 tablet by mouth daily.   LATANOPROST (XALATAN) 0.005 % OPHTHALMIC SOLUTION    Place 1 drop into both eyes at bedtime.    LISINOPRIL (PRINIVIL,ZESTRIL) 20 MG TABLET    Take 1 tablet (20 mg total) by mouth daily. Reported on 02/05/2016   LOPERAMIDE (IMODIUM) 2 MG CAPSULE    Take 2 mg by mouth every 4 (four) hours as needed for diarrhea or loose stools.    MIRTAZAPINE (REMERON) 15 MG TABLET    Take 15 mg by mouth at bedtime.   QUETIAPINE (SEROQUEL) 25 MG TABLET    Take 1 tablet (25 mg total) by mouth 2 (two) times daily. Reported on 02/05/2016  Modified Medications   No medications on file    Discontinued Medications   No medications on file    Review of Systems  Unable to perform ROS: Dementia    Vitals:   05/08/17 1140  BP: 122/68  Pulse: 60  Resp: 17  Temp: 98.2 F (36.8 C)  SpO2: 94%  Weight: 133 lb 1.6 oz (60.4 kg)  Height: '5\' 5"'  (1.651 m)   Body mass index  is 22.15 kg/m.  Physical Exam  Constitutional: She appears well-developed.  Frail appearing, sitting in w/c in NAD  HENT:  Mouth/Throat: Oropharynx is clear and moist. No oropharyngeal exudate.  MMM; no oral thrush  Eyes: Pupils are equal, round, and reactive to light. No scleral icterus.  Neck: Neck supple. Carotid bruit is not present.  Cardiovascular: Normal rate, regular rhythm and intact distal pulses.  Exam reveals no gallop and no friction rub.   Murmur (1/6 SEM) heard. No LE edema b/l. no calf TTP.   Pulmonary/Chest: Effort normal and breath sounds normal. No respiratory distress. She has no wheezes. She has no rales.  Abdominal: Soft. Normal appearance and bowel sounds are normal. She exhibits no distension and no mass. There is no hepatomegaly. There is no tenderness. There is no rigidity, no rebound and no guarding. No hernia.  Musculoskeletal: She exhibits edema.  Lymphadenopathy:    She has no cervical adenopathy.  Neurological: She is alert.  Skin: Skin is warm and dry. No rash noted.  geri sleeves intact   Psychiatric: She has a normal mood and affect. Her behavior is normal.  Expressive aphasia     Labs reviewed: Nursing Home on 05/08/2017  Component Date Value Ref Range Status  . Hemoglobin 04/21/2017 14.7  12.0 - 16.0 Final  . HCT 04/21/2017 44  36 - 46 Final  . Neutrophils Absolute 04/21/2017 4   Final  . Platelets 04/21/2017 280  150 - 399 Final  . WBC 04/21/2017 7.4   Final  Admission on 03/23/2017, Discharged on 03/28/2017  Component Date Value Ref Range Status  . Sodium 03/23/2017 143  135 - 145 mmol/L Final  . Potassium 03/23/2017 4.3  3.5 - 5.1 mmol/L Final  .  Chloride 03/23/2017 107  101 - 111 mmol/L Final  . BUN 03/23/2017 20  6 - 20 mg/dL Final  . Creatinine, Ser 03/23/2017 1.00  0.44 - 1.00 mg/dL Final  . Glucose, Bld 03/23/2017 98  65 - 99 mg/dL Final  . Calcium, Ion 03/23/2017 1.17  1.15 - 1.40 mmol/L Final  . TCO2 03/23/2017 27  0 - 100 mmol/L Final  . Hemoglobin 03/23/2017 15.6* 12.0 - 15.0 g/dL Final  . HCT 03/23/2017 46.0  36.0 - 46.0 % Final  . Prothrombin Time 03/23/2017 15.5* 11.4 - 15.2 seconds Final  . INR 03/23/2017 1.23   Final  . aPTT 03/23/2017 29  24 - 36 seconds Final  . WBC 03/23/2017 6.2  4.0 - 10.5 K/uL Final  . RBC 03/23/2017 5.00  3.87 - 5.11 MIL/uL Final  . Hemoglobin 03/23/2017 14.6  12.0 - 15.0 g/dL Final  . HCT 03/23/2017 44.5  36.0 - 46.0 % Final  . MCV 03/23/2017 89.0  78.0 - 100.0 fL Final  . MCH 03/23/2017 29.2  26.0 - 34.0 pg Final  . MCHC 03/23/2017 32.8  30.0 - 36.0 g/dL Final  . RDW 03/23/2017 14.8  11.5 - 15.5 % Final  . Platelets 03/23/2017 217  150 - 400 K/uL Final  . Neutrophils Relative % 03/23/2017 70  % Final  . Neutro Abs 03/23/2017 4.3  1.7 - 7.7 K/uL Final  . Lymphocytes Relative 03/23/2017 25  % Final  . Lymphs Abs 03/23/2017 1.5  0.7 - 4.0 K/uL Final  . Monocytes Relative 03/23/2017 4  % Final  . Monocytes Absolute 03/23/2017 0.3  0.1 - 1.0 K/uL Final  . Eosinophils Relative 03/23/2017 1  % Final  . Eosinophils Absolute 03/23/2017 0.1  0.0 - 0.7  K/uL Final  . Basophils Relative 03/23/2017 0  % Final  . Basophils Absolute 03/23/2017 0.0  0.0 - 0.1 K/uL Final  . Sodium 03/23/2017 141  135 - 145 mmol/L Final  . Potassium 03/23/2017 4.1  3.5 - 5.1 mmol/L Final  . Chloride 03/23/2017 110  101 - 111 mmol/L Final  . CO2 03/23/2017 25  22 - 32 mmol/L Final  . Glucose, Bld 03/23/2017 104* 65 - 99 mg/dL Final  . BUN 03/23/2017 15  6 - 20 mg/dL Final  . Creatinine, Ser 03/23/2017 1.02* 0.44 - 1.00 mg/dL Final  . Calcium 03/23/2017 9.5  8.9 - 10.3 mg/dL Final  . Total Protein 03/23/2017 6.7  6.5 -  8.1 g/dL Final  . Albumin 03/23/2017 3.7  3.5 - 5.0 g/dL Final  . AST 03/23/2017 25  15 - 41 U/L Final  . ALT 03/23/2017 12* 14 - 54 U/L Final  . Alkaline Phosphatase 03/23/2017 109  38 - 126 U/L Final  . Total Bilirubin 03/23/2017 1.0  0.3 - 1.2 mg/dL Final  . GFR calc non Af Amer 03/23/2017 48* >60 mL/min Final  . GFR calc Af Amer 03/23/2017 56* >60 mL/min Final   Comment: (NOTE) The eGFR has been calculated using the CKD EPI equation. This calculation has not been validated in all clinical situations. eGFR's persistently <60 mL/min signify possible Chronic Kidney Disease.   . Anion gap 03/23/2017 6  5 - 15 Final  . Troponin i, poc 03/23/2017 0.00  0.00 - 0.08 ng/mL Final  . Comment 3 03/23/2017          Final   Comment: Due to the release kinetics of cTnI, a negative result within the first hours of the onset of symptoms does not rule out myocardial infarction with certainty. If myocardial infarction is still suspected, repeat the test at appropriate intervals.   . Color, Urine 03/23/2017 YELLOW  YELLOW Final  . APPearance 03/23/2017 CLEAR  CLEAR Final  . Specific Gravity, Urine 03/23/2017 1.012  1.005 - 1.030 Final  . pH 03/23/2017 7.0  5.0 - 8.0 Final  . Glucose, UA 03/23/2017 NEGATIVE  NEGATIVE mg/dL Final  . Hgb urine dipstick 03/23/2017 NEGATIVE  NEGATIVE Final  . Bilirubin Urine 03/23/2017 NEGATIVE  NEGATIVE Final  . Ketones, ur 03/23/2017 NEGATIVE  NEGATIVE mg/dL Final  . Protein, ur 03/23/2017 NEGATIVE  NEGATIVE mg/dL Final  . Nitrite 03/23/2017 NEGATIVE  NEGATIVE Final  . Leukocytes, UA 03/23/2017 NEGATIVE  NEGATIVE Final  . Hgb A1c MFr Bld 03/24/2017 5.3  4.8 - 5.6 % Final   Comment: (NOTE)         Pre-diabetes: 5.7 - 6.4         Diabetes: >6.4         Glycemic control for adults with diabetes: <7.0   . Mean Plasma Glucose 03/24/2017 105  mg/dL Final   Comment: (NOTE) Performed At: [facility] [place], [place] [id number] Lindon Romp MD NU:[id number]   . Cholesterol 03/24/2017 122  0 - 200 mg/dL Final  . Triglycerides 03/24/2017 72  <150 mg/dL Final  . HDL 03/24/2017 35* >40 mg/dL Final  . Total CHOL/HDL Ratio 03/24/2017 3.5  RATIO Final  . VLDL 03/24/2017 14  0 - 40 mg/dL Final  . LDL Cholesterol 03/24/2017 73  0 - 99 mg/dL Final   Comment:        Total Cholesterol/HDL:CHD Risk Coronary Heart Disease Risk Table  Men   Women  1/2 Average Risk   3.4   3.3  Average Risk       5.0   4.4  2 X Average Risk   9.6   7.1  3 X Average Risk  23.4   11.0        Use the calculated Patient Ratio above and the CHD Risk Table to determine the patient's CHD Risk.        ATP III CLASSIFICATION (LDL):  <100     mg/dL   Optimal  100-129  mg/dL   Near or Above                    Optimal  130-159  mg/dL   Borderline  160-189  mg/dL   High  >190     mg/dL   Very High     No results found.   Assessment/Plan   ICD-10-CM   1. Late onset Alzheimer's disease without behavioral disturbance G30.1    F02.80   2. PAF (paroxysmal atrial fibrillation) (HCC) I48.0    s/p pacemaker  3. History of recent stroke Z75.73    with aphasia and dysphagia  4. Essential hypertension, benign I10   5. Depression with anxiety F41.8      Cont current meds as ordered  PT/OT/ST as ordered  Fall precautions  Will follow  Jacquelene Kopecky S. Perlie Gold  [facility] [address] [place], [place] [address] [phone] Cell (Monday-Friday 8 AM - 5 PM) [phone] After 5 PM and follow prompts

## 2017-05-26 ENCOUNTER — Encounter: Payer: Self-pay | Admitting: Adult Health

## 2017-05-26 ENCOUNTER — Non-Acute Institutional Stay (SKILLED_NURSING_FACILITY): Payer: Medicare Other | Admitting: Adult Health

## 2017-05-26 DIAGNOSIS — I639 Cerebral infarction, unspecified: Secondary | ICD-10-CM

## 2017-05-26 DIAGNOSIS — F0280 Dementia in other diseases classified elsewhere without behavioral disturbance: Secondary | ICD-10-CM

## 2017-05-26 DIAGNOSIS — G301 Alzheimer's disease with late onset: Secondary | ICD-10-CM | POA: Diagnosis not present

## 2017-05-26 DIAGNOSIS — F418 Other specified anxiety disorders: Secondary | ICD-10-CM | POA: Diagnosis not present

## 2017-05-26 NOTE — Progress Notes (Addendum)
Location:   [facility] Room Number: 121 A Place of Service:  SNF (31)   CODE STATUS: DNR  Allergies  Allergen Reactions  . Codeine Other (See Comments)    unknown  . Iodine Other (See Comments)    Rash and swelling    Chief Complaint  Patient presents with  . Acute Visit    Family Concerns    HPI:  Her family is concerned that she is sedated and sleeping too much. The staff reports that she is out in front the nursing station and is engaging with those around her. The staff also report that they have not seen any lethargy. She is unable to participate in the hpi or ros.    Past Medical History:  Diagnosis Date  . Anxiety   . Coronary artery disease   . Dementia   . Depression   . Dizziness   . Fall   . GERD (gastroesophageal reflux disease)   . Glaucoma   . HX: anticoagulation   . Hypertension   . Osteoarthritis   . Permanent atrial fibrillation (HCC)   . SSS (sick sinus syndrome) (HCC)   . Tachy-brady syndrome [facility](HCC)     Past Surgical History:  Procedure Laterality Date  . BREAST REDUCTION SURGERY    . CARDIOVASCULAR STRESS TEST  02/15/2007  . CHOLECYSTECTOMY    . GASTRIC BYPASS    . INSERT / REPLACE / REMOVE PACEMAKER  01/24/2008   DDD PACEMAKER IMPLANT  . PACEMAKER INSERTION    . PERMANENT PACEMAKER GENERATOR CHANGE N/A 01/29/2013   Procedure: PERMANENT PACEMAKER GENERATOR CHANGE;  Surgeon: Hillis RangeJames Allred, MD;  [facility];  Service: Cardiovascular;  Laterality: N/A;  . TOTAL KNEE ARTHROPLASTY     right  . US ECHOCARDIOGRAPHY  08/10/2007   EF 60%  . US ECHOCARDIOGRAPHY  01/12/2007   EF 70%    Social History   Social History  . Marital status: Widowed    Spouse name: N/A  . Number of children: 2  . Years of education: N/A   Occupational History  . RETIRED Retired   Social History Main Topics  . Smoking status: Former Smoker    Quit date: 07/17/1961  . Smokeless tobacco: Never Used  . Alcohol use No  . Drug use: No  .  Sexual activity: No   Other Topics Concern  . Not on file   Social History Narrative  . No narrative on file   Family History  Problem Relation Age of Onset  . Kidney failure Mother   . Heart attack Father   . Heart attack Sister   . Heart attack Brother       VITAL SIGNS BP 134/88   Pulse 80   Temp (!) 97 F (36.1 C)   Resp 18   Ht 5\' 5"  (1.651 m)   Wt 130 lb 8 oz (59.2 kg)   SpO2 98%   BMI 21.72 kg/m   Patient's Medications  New Prescriptions   No medications on file  Previous Medications   ACETAMINOPHEN (TYLENOL) 500 MG TABLET    Take 500 mg by mouth every 4 (four) hours as needed for mild pain or headache.    ALUM & MAG HYDROXIDE-SIMETH (MINTOX) 200-200-20 MG/5ML SUSPENSION    Take 30 mLs by mouth every 6 (six) hours as needed for indigestion or heartburn (Not to exceed 4 doses in 24 hours).   ASPIRIN EC 81 MG TABLET    Take 81 mg by mouth daily.  AZELASTINE (OPTIVAR) 0.05 % OPHTHALMIC SOLUTION    Place 1 drop into both eyes 2 (two) times daily.    CARBOXYMETHYLCELLULOSE (LUBRICANT EYE DROPS) 0.5 % SOLN    Place 1 drop into both eyes 4 (four) times daily.    CHOLECALCIFEROL (VITAMIN D) 1000 UNITS TABLET    Take 1,000 Units by mouth daily.   CRANBERRY 450 MG TABS    Take 1 tablet by mouth daily.   LATANOPROST (XALATAN) 0.005 % OPHTHALMIC SOLUTION    Place 1 drop into both eyes at bedtime.    LISINOPRIL (PRINIVIL,ZESTRIL) 20 MG TABLET    Take 1 tablet (20 mg total) by mouth daily. Reported on 02/05/2016   LOPERAMIDE (IMODIUM) 2 MG CAPSULE    Take 2 mg by mouth every 4 (four) hours as needed for diarrhea or loose stools.    MIRTAZAPINE (REMERON) 15 MG TABLET    Take 15 mg by mouth at bedtime.   seroquel 12.5 mg  Take 12.5 mg twice daily    NUTRITIONAL SUPPLEMENT LIQD    House Supplement - Intake Magic cup (nutritional treat) 4 ounces with meals  Modified Medications   No medications on file  Discontinued Medications   QUETIAPINE (SEROQUEL) 25 MG TABLET    Take 1  tablet (25 mg total) by mouth 2 (two) times daily. Reported on 02/05/2016     SIGNIFICANT DIAGNOSTIC EXAMS  03-23-17: chest x-ray: Mild enlargement of the cardiac silhouette without evidence of pulmonary vascular congestion or pulmonary edema. No acute pneumonia. Thoracic aortic atherosclerosis.   03-23-17: ct of head: No acute intracranial abnormality. Atrophy, chronic small vessel disease.  03-24-17: ct of head: Focal hypodensity/ gyral edema in the left posterior frontal lobe with extension into adjacent subcortical and periventricular white matter, suspicious for an area of infarct. There is no hemorrhage or significant mass effect. Atrophy with extensive white matter small vessel ischemic changes.    LABS REVIEWED:   03-23-17: wbc 6.2; hgb 14.6; hct 44.;5 mcv 89.0; plt 217; glucose 104; bun 15; create 1.02; k+ 4.1 ;na++ 141; liver normal albumin 3.7 03-24-17: hgb a1 5.3; chol 122; ldl 73; trig 72; hdl 35     Review of Systems  Unable to perform ROS: Other (aphasia from cva)    Physical Exam  Constitutional: She appears well-developed and well-nourished. No distress.  Eyes: Conjunctivae are normal.  Neck: Neck supple. No JVD present. No thyromegaly present.  Cardiovascular: Normal rate, regular rhythm and intact distal pulses.   Respiratory: Effort normal and breath sounds normal. No respiratory distress. She has no wheezes.  GI: Soft. Bowel sounds are normal. She exhibits no distension. There is no tenderness.  Musculoskeletal: She exhibits no edema.  Is able to move all extremities Lymphadenopathy:    She has no cervical adenopathy.  Neurological: She is alert.  Skin: Skin is warm and dry. She is not diaphoretic Skin is fragile.  Has bruising on both arms right hand  and both lower extremities is wearing protective sleeves       ASSESSMENT/ PLAN:  1. CVA: will continue asa 81 mg daily  Lipid panel essentially normal; will continue therapy as directed to improve upon mobility  gait and adl training  2. Depression with anxiety: will continue remeron 15 mg nightly and will lower seroquel to 12.5 mg nightly for one week then stop. Will monitor if she continues to struggle with  Lethargy will then consider lowering the remeron.   3. Alzheimer's disease: is currently not on medication; her  current weight is 130 pounds   Will not make changes will monitor      MD is aware of resident's narcotic use and is in agreement with current plan of care. We will attempt to wean resident as apropriate    Synthia Innocent NP [facility]  Contact [phone] Monday through Friday 8am- 5pm  After hours call [phone]

## 2017-06-05 ENCOUNTER — Encounter: Payer: Self-pay | Admitting: Internal Medicine

## 2017-06-05 ENCOUNTER — Non-Acute Institutional Stay (SKILLED_NURSING_FACILITY): Payer: Medicare Other | Admitting: Internal Medicine

## 2017-06-05 DIAGNOSIS — Z8673 Personal history of transient ischemic attack (TIA), and cerebral infarction without residual deficits: Secondary | ICD-10-CM

## 2017-06-05 DIAGNOSIS — G301 Alzheimer's disease with late onset: Secondary | ICD-10-CM | POA: Diagnosis not present

## 2017-06-05 DIAGNOSIS — I480 Paroxysmal atrial fibrillation: Secondary | ICD-10-CM | POA: Diagnosis not present

## 2017-06-05 DIAGNOSIS — F0280 Dementia in other diseases classified elsewhere without behavioral disturbance: Secondary | ICD-10-CM | POA: Diagnosis not present

## 2017-06-05 DIAGNOSIS — F418 Other specified anxiety disorders: Secondary | ICD-10-CM | POA: Diagnosis not present

## 2017-06-05 DIAGNOSIS — I10 Essential (primary) hypertension: Secondary | ICD-10-CM

## 2017-06-05 DIAGNOSIS — I69320 Aphasia following cerebral infarction: Secondary | ICD-10-CM

## 2017-06-05 NOTE — Progress Notes (Signed)
Patient ID: Alison Dodson, female   DOB: July 07, 1928, 83 y.o.   MRN: [id number]    DATE:  06/05/2017  Location:    [facility] Room Number: 315 A Place of Service: SNF (31)   Extended Emergency Contact Information Primary Emergency Contact: Sullivan,Judy Address: [address]          [place], [place] [address] [place] Phone: [phone] Mobile Phone: [phone] Relation: Daughter  Advanced Directive information Does Patient Have a Medical Advance Directive?: Yes, Type of Advance Directive: Out of facility DNR (pink MOST or yellow form), Pre-existing out of facility DNR order (yellow form or pink MOST form): Yellow form placed in chart (order not valid for inpatient use);Pink MOST form placed in chart (order not valid for inpatient use), Does patient want to make changes to medical advance directive?: No - Patient declined  Chief Complaint  Patient presents with  . Medical Management of Chronic Issues    Routine Visit    HPI:  83 yo short term rehab female seen today for f/u. She has no concerns. Her appetite is ok and she sleeps well. No falls. No nursing issues. She is a poor historian due to dementia. Hx obtained from chart.  Hypertension - BP controlled on lisinopril 20 mg daily; ASA  81 mg daily for anticoagulation.    Chronic afib with bradycardia/tachycardia syndrome - EF 55-60% (02-06-16). Rate controlled s/p DDD pacemaker insertion.Takes ASA 81 mg daily. Her family has opted not to have any further anticoagulation therapy due to bruising and history of falls.   Hx CVA - takes ASA 81 mg daily. Lipid panel essentially normal. She has Aphasia due to CVA and  attentional dysphagia. No new sx's  Alzheimer's disease - unchanged. She is no longer on aricept as it was stopped in the hospital. She no longer takes seroquel  Recurrent UTI - no recent recurrence. she takes chronic cranberry tabs.   Depression with anxiety - stable on remeron 15 mg  nightly; seroquel has been tapered off 2/2 sedation  Glaucoma - stable on optivar to both eyes twice daily; xalatan to both eyes nightly     Past Medical History:  Diagnosis Date  . Anxiety   . Coronary artery disease   . Dementia   . Depression   . Dizziness   . Fall   . GERD (gastroesophageal reflux disease)   . Glaucoma   . HX: anticoagulation   . Hypertension   . Osteoarthritis   . Permanent atrial fibrillation ([facility])   . SSS (sick sinus syndrome) ([place])   . Tachy-brady syndrome [facility])     Past Surgical History:  Procedure Laterality Date  . BREAST REDUCTION SURGERY    . CARDIOVASCULAR STRESS TEST  02/15/2007  . CHOLECYSTECTOMY    . GASTRIC BYPASS    . INSERT / REPLACE / REMOVE PACEMAKER  01/24/2008   DDD PACEMAKER IMPLANT  . PACEMAKER INSERTION    . PERMANENT PACEMAKER GENERATOR CHANGE N/A 01/29/2013   Procedure: PERMANENT PACEMAKER GENERATOR CHANGE;  Surgeon: Thompson Grayer, MD;  Location: [facility] CATH LAB;  Service: Cardiovascular;  Laterality: N/A;  . TOTAL KNEE ARTHROPLASTY     right  . US ECHOCARDIOGRAPHY  08/10/2007   EF 60%  . US ECHOCARDIOGRAPHY  01/12/2007   EF 70%    Patient Care Team: Sande Brothers, MD as PCP - General (Internal Medicine) Thompson Grayer, MD as Attending Physician (Cardiology)  Social History   Social History  . Marital status: Widowed  Spouse name: N/A  . Number of children: 2  . Years of education: N/A   Occupational History  . RETIRED Retired   Social History Main Topics  . Smoking status: Former Smoker    Quit date: 07/17/1961  . Smokeless tobacco: Never Used  . Alcohol use No  . Drug use: No  . Sexual activity: No   Other Topics Concern  . Not on file   Social History Narrative  . No narrative on file     reports that she quit smoking about 55 years ago. She has never used smokeless tobacco. She reports that she does not drink alcohol or use drugs.  Family History  Problem Relation Age of Onset  . Kidney failure Mother    . Heart attack Father   . Heart attack Sister   . Heart attack Brother    Family Status  Relation Status  . Mother Deceased at age 22  . Father Deceased at age 46  . Sister Deceased  . Brother Deceased  . Brother Deceased  . Brother Deceased  . Brother Deceased  . Brother Deceased  . Brother Deceased  . Sister Deceased  . Sister Deceased  . Sister Deceased    Immunization History  Administered Date(s) Administered  . PPD Test 04/05/2017  . Td 12/23/2013    Allergies  Allergen Reactions  . Codeine Other (See Comments)    unknown  . Iodine Other (See Comments)    Rash and swelling    Medications: Patient's Medications  New Prescriptions   No medications on file  Previous Medications   ACETAMINOPHEN (TYLENOL) 500 MG TABLET    Take 500 mg by mouth every 4 (four) hours as needed for mild pain or headache.    ALUM & MAG HYDROXIDE-SIMETH (MINTOX) [phone] MG/5ML SUSPENSION    Take 30 mLs by mouth every 6 (six) hours as needed for indigestion or heartburn (Not to exceed 4 doses in 24 hours).   ASPIRIN EC 81 MG TABLET    Take 81 mg by mouth daily.   AZELASTINE (OPTIVAR) 0.05 % OPHTHALMIC SOLUTION    Place 1 drop into both eyes 2 (two) times daily.    CARBOXYMETHYLCELLULOSE (LUBRICANT EYE DROPS) 0.5 % SOLN    Place 1 drop into both eyes 4 (four) times daily.    CHOLECALCIFEROL (VITAMIN D) 1000 UNITS TABLET    Take 1,000 Units by mouth daily.   CRANBERRY 450 MG TABS    Take 1 tablet by mouth daily.   LATANOPROST (XALATAN) 0.005 % OPHTHALMIC SOLUTION    Place 1 drop into both eyes at bedtime.    LISINOPRIL (PRINIVIL,ZESTRIL) 20 MG TABLET    Take 1 tablet (20 mg total) by mouth daily. Reported on 02/05/2016   LOPERAMIDE (IMODIUM) 2 MG CAPSULE    Take 2 mg by mouth every 4 (four) hours as needed for diarrhea or loose stools.    MIRTAZAPINE (REMERON) 15 MG TABLET    Take 15 mg by mouth at bedtime.   NUTRITIONAL SUPPLEMENT LIQD    House Supplement - Intake Magic cup (nutritional  treat) 4 ounces with meals  Modified Medications   No medications on file  Discontinued Medications   QUETIAPINE (SEROQUEL) 25 MG TABLET    Take 1 tablet (25 mg total) by mouth 2 (two) times daily. Reported on 02/05/2016    Review of Systems  Unable to perform ROS: Dementia    Vitals:   06/05/17 1236  BP: 122/82  Pulse: 79  Resp: 16  Temp: 97.8 F (36.6 C)  TempSrc: Oral  SpO2: 99%  Weight: 128 lb 6.4 oz (58.2 kg)  Height: '5\' 5"'  (1.651 m)   Body mass index is 21.37 kg/m.  Physical Exam  Constitutional: She appears well-developed.  Frail appearing in NAD, sitting in w/c  HENT:  Mouth/Throat: Oropharynx is clear and moist. No oropharyngeal exudate.  MMM; no oral thrush  Eyes: Pupils are equal, round, and reactive to light. No scleral icterus.  Neck: Neck supple. Carotid bruit is not present. No tracheal deviation present. No thyromegaly present.  Cardiovascular: Normal rate, regular rhythm and intact distal pulses.  Exam reveals no gallop and no friction rub.   Murmur (1/6 SEM) heard. No LE edema b/l. no calf TTP.   Pulmonary/Chest: Effort normal and breath sounds normal. No stridor. No respiratory distress. She has no wheezes. She has no rales.  Abdominal: Soft. Normal appearance and bowel sounds are normal. She exhibits no distension and no mass. There is no hepatomegaly. There is no tenderness. There is no rigidity, no rebound and no guarding. No hernia.  Lymphadenopathy:    She has no cervical adenopathy.  Neurological: She is alert. She has normal reflexes.  Skin: Skin is warm and dry. No rash noted.  Psychiatric: She has a normal mood and affect. Her behavior is normal. Thought content normal.     Labs reviewed: Nursing Home on 05/08/2017  Component Date Value Ref Range Status  . Hemoglobin 04/21/2017 14.7  12.0 - 16.0 Final  . HCT 04/21/2017 44  36 - 46 Final  . Neutrophils Absolute 04/21/2017 4   Final  . Platelets 04/21/2017 280  150 - 399 Final  . WBC  04/21/2017 7.4   Final  Admission on 03/23/2017, Discharged on 03/28/2017  Component Date Value Ref Range Status  . Sodium 03/23/2017 143  135 - 145 mmol/L Final  . Potassium 03/23/2017 4.3  3.5 - 5.1 mmol/L Final  . Chloride 03/23/2017 107  101 - 111 mmol/L Final  . BUN 03/23/2017 20  6 - 20 mg/dL Final  . Creatinine, Ser 03/23/2017 1.00  0.44 - 1.00 mg/dL Final  . Glucose, Bld 03/23/2017 98  65 - 99 mg/dL Final  . Calcium, Ion 03/23/2017 1.17  1.15 - 1.40 mmol/L Final  . TCO2 03/23/2017 27  0 - 100 mmol/L Final  . Hemoglobin 03/23/2017 15.6* 12.0 - 15.0 g/dL Final  . HCT 03/23/2017 46.0  36.0 - 46.0 % Final  . Prothrombin Time 03/23/2017 15.5* 11.4 - 15.2 seconds Final  . INR 03/23/2017 1.23   Final  . aPTT 03/23/2017 29  24 - 36 seconds Final  . WBC 03/23/2017 6.2  4.0 - 10.5 K/uL Final  . RBC 03/23/2017 5.00  3.87 - 5.11 MIL/uL Final  . Hemoglobin 03/23/2017 14.6  12.0 - 15.0 g/dL Final  . HCT 03/23/2017 44.5  36.0 - 46.0 % Final  . MCV 03/23/2017 89.0  78.0 - 100.0 fL Final  . MCH 03/23/2017 29.2  26.0 - 34.0 pg Final  . MCHC 03/23/2017 32.8  30.0 - 36.0 g/dL Final  . RDW 03/23/2017 14.8  11.5 - 15.5 % Final  . Platelets 03/23/2017 217  150 - 400 K/uL Final  . Neutrophils Relative % 03/23/2017 70  % Final  . Neutro Abs 03/23/2017 4.3  1.7 - 7.7 K/uL Final  . Lymphocytes Relative 03/23/2017 25  % Final  . Lymphs Abs 03/23/2017 1.5  0.7 - 4.0 K/uL Final  . Monocytes Relative  03/23/2017 4  % Final  . Monocytes Absolute 03/23/2017 0.3  0.1 - 1.0 K/uL Final  . Eosinophils Relative 03/23/2017 1  % Final  . Eosinophils Absolute 03/23/2017 0.1  0.0 - 0.7 K/uL Final  . Basophils Relative 03/23/2017 0  % Final  . Basophils Absolute 03/23/2017 0.0  0.0 - 0.1 K/uL Final  . Sodium 03/23/2017 141  135 - 145 mmol/L Final  . Potassium 03/23/2017 4.1  3.5 - 5.1 mmol/L Final  . Chloride 03/23/2017 110  101 - 111 mmol/L Final  . CO2 03/23/2017 25  22 - 32 mmol/L Final  . Glucose, Bld  03/23/2017 104* 65 - 99 mg/dL Final  . BUN 03/23/2017 15  6 - 20 mg/dL Final  . Creatinine, Ser 03/23/2017 1.02* 0.44 - 1.00 mg/dL Final  . Calcium 03/23/2017 9.5  8.9 - 10.3 mg/dL Final  . Total Protein 03/23/2017 6.7  6.5 - 8.1 g/dL Final  . Albumin 03/23/2017 3.7  3.5 - 5.0 g/dL Final  . AST 03/23/2017 25  15 - 41 U/L Final  . ALT 03/23/2017 12* 14 - 54 U/L Final  . Alkaline Phosphatase 03/23/2017 109  38 - 126 U/L Final  . Total Bilirubin 03/23/2017 1.0  0.3 - 1.2 mg/dL Final  . GFR calc non Af Amer 03/23/2017 48* >60 mL/min Final  . GFR calc Af Amer 03/23/2017 56* >60 mL/min Final   Comment: (NOTE) The eGFR has been calculated using the CKD EPI equation. This calculation has not been validated in all clinical situations. eGFR's persistently <60 mL/min signify possible Chronic Kidney Disease.   . Anion gap 03/23/2017 6  5 - 15 Final  . Troponin i, poc 03/23/2017 0.00  0.00 - 0.08 ng/mL Final  . Comment 3 03/23/2017          Final   Comment: Due to the release kinetics of cTnI, a negative result within the first hours of the onset of symptoms does not rule out myocardial infarction with certainty. If myocardial infarction is still suspected, repeat the test at appropriate intervals.   . Color, Urine 03/23/2017 YELLOW  YELLOW Final  . APPearance 03/23/2017 CLEAR  CLEAR Final  . Specific Gravity, Urine 03/23/2017 1.012  1.005 - 1.030 Final  . pH 03/23/2017 7.0  5.0 - 8.0 Final  . Glucose, UA 03/23/2017 NEGATIVE  NEGATIVE mg/dL Final  . Hgb urine dipstick 03/23/2017 NEGATIVE  NEGATIVE Final  . Bilirubin Urine 03/23/2017 NEGATIVE  NEGATIVE Final  . Ketones, ur 03/23/2017 NEGATIVE  NEGATIVE mg/dL Final  . Protein, ur 03/23/2017 NEGATIVE  NEGATIVE mg/dL Final  . Nitrite 03/23/2017 NEGATIVE  NEGATIVE Final  . Leukocytes, UA 03/23/2017 NEGATIVE  NEGATIVE Final  . Hgb A1c MFr Bld 03/24/2017 5.3  4.8 - 5.6 % Final   Comment: (NOTE)         Pre-diabetes: 5.7 - 6.4         Diabetes:  >6.4         Glycemic control for adults with diabetes: <7.0   . Mean Plasma Glucose 03/24/2017 105  mg/dL Final   Comment: (NOTE) Performed At: [facility] [place], [place] [id number] Lindon Romp MD AL:[id number]   . Cholesterol 03/24/2017 122  0 - 200 mg/dL Final  . Triglycerides 03/24/2017 72  <150 mg/dL Final  . HDL 03/24/2017 35* >40 mg/dL Final  . Total CHOL/HDL Ratio 03/24/2017 3.5  RATIO Final  . VLDL 03/24/2017 14  0 - 40 mg/dL Final  . LDL  Cholesterol 03/24/2017 73  0 - 99 mg/dL Final   Comment:        Total Cholesterol/HDL:CHD Risk Coronary Heart Disease Risk Table                     Men   Women  1/2 Average Risk   3.4   3.3  Average Risk       5.0   4.4  2 X Average Risk   9.6   7.1  3 X Average Risk  23.4   11.0        Use the calculated Patient Ratio above and the CHD Risk Table to determine the patient's CHD Risk.        ATP III CLASSIFICATION (LDL):  <100     mg/dL   Optimal  100-129  mg/dL   Near or Above                    Optimal  130-159  mg/dL   Borderline  160-189  mg/dL   High  >190     mg/dL   Very High     No results found.   Assessment/Plan   ICD-10-CM   1. History of recent stroke Z86.73    stable  2. Aphasia due to recent stroke I69.320   3. PAF (paroxysmal atrial fibrillation) (HCC) I48.0   4. Essential hypertension, benign I10   5. Depression with anxiety F41.8   6. Late onset Alzheimer's disease without behavioral disturbance G30.1    F02.80     Cont current meds as ordered  Cont PT/OT/ST as ordered  Will follow  Reece Fehnel S. Perlie Gold  [facility] [address] [place], [place] [address] [phone] Cell (Monday-Friday 8 AM - 5 PM) [phone] After 5 PM and follow prompts

## 2017-07-03 ENCOUNTER — Non-Acute Institutional Stay (SKILLED_NURSING_FACILITY): Payer: Medicare Other | Admitting: Adult Health

## 2017-07-03 ENCOUNTER — Encounter: Payer: Self-pay | Admitting: Adult Health

## 2017-07-03 DIAGNOSIS — I639 Cerebral infarction, unspecified: Secondary | ICD-10-CM

## 2017-07-03 DIAGNOSIS — G301 Alzheimer's disease with late onset: Secondary | ICD-10-CM | POA: Diagnosis not present

## 2017-07-03 DIAGNOSIS — R4701 Aphasia: Secondary | ICD-10-CM | POA: Diagnosis not present

## 2017-07-03 DIAGNOSIS — I482 Chronic atrial fibrillation, unspecified: Secondary | ICD-10-CM

## 2017-07-03 DIAGNOSIS — I10 Essential (primary) hypertension: Secondary | ICD-10-CM

## 2017-07-03 DIAGNOSIS — F0280 Dementia in other diseases classified elsewhere without behavioral disturbance: Secondary | ICD-10-CM

## 2017-07-03 DIAGNOSIS — F418 Other specified anxiety disorders: Secondary | ICD-10-CM | POA: Diagnosis not present

## 2017-07-03 NOTE — Progress Notes (Signed)
Location:   [facility] Room Number: 121 A Place of Service:  SNF (31)   CODE STATUS: DNR  Allergies  Allergen Reactions  . Codeine Other (See Comments)    unknown  . Iodine Other (See Comments)    Rash and swelling    Chief Complaint  Patient presents with  . Medical Management of Chronic Issues    1 month follow up    HPI:  She is an 83 year old long term resident of this facility being seen for the management of her chronic illnesses: cva; depression with anxiety; Alzheimer's disease; and afib  hpyertension. She is unable to participate in the hpi or ros. She continues to get out of bed daily. There are no indications of pain present. There are no nursing concerns at this time.   Past Medical History:  Diagnosis Date  . Anxiety   . Coronary artery disease   . Dementia   . Depression   . Dizziness   . Fall   . GERD (gastroesophageal reflux disease)   . Glaucoma   . HX: anticoagulation   . Hypertension   . Osteoarthritis   . Permanent atrial fibrillation (HCC)   . SSS (sick sinus syndrome) (HCC)   . Tachy-brady syndrome [facility])     Past Surgical History:  Procedure Laterality Date  . BREAST REDUCTION SURGERY    . CARDIOVASCULAR STRESS TEST  02/15/2007  . CHOLECYSTECTOMY    . GASTRIC BYPASS    . INSERT / REPLACE / REMOVE PACEMAKER  01/24/2008   DDD PACEMAKER IMPLANT  . PACEMAKER INSERTION    . PERMANENT PACEMAKER GENERATOR CHANGE N/A 01/29/2013   Procedure: PERMANENT PACEMAKER GENERATOR CHANGE;  Surgeon: Hillis Range, MD;  Location: [facility] CATH LAB;  Service: Cardiovascular;  Laterality: N/A;  . TOTAL KNEE ARTHROPLASTY     right  . US ECHOCARDIOGRAPHY  08/10/2007   EF 60%  . US ECHOCARDIOGRAPHY  01/12/2007   EF 70%    Social History   Social History  . Marital status: Widowed    Spouse name: N/A  . Number of children: 2  . Years of education: N/A   Occupational History  . RETIRED Retired   Social History Main Topics  . Smoking status: Former  Smoker    Quit date: 07/17/1961  . Smokeless tobacco: Never Used  . Alcohol use No  . Drug use: No  . Sexual activity: No   Other Topics Concern  . Not on file   Social History Narrative  . No narrative on file   Family History  Problem Relation Age of Onset  . Kidney failure Mother   . Heart attack Father   . Heart attack Sister   . Heart attack Brother       VITAL SIGNS BP 120/68   Pulse 80   Temp 98 F (36.7 C)   Resp 18   Ht 5\' 5"  (1.651 m)   Wt 134 lb 8 oz (61 kg)   SpO2 96%   BMI 22.38 kg/m   Patient's Medications  New Prescriptions   No medications on file  Previous Medications   ACETAMINOPHEN (TYLENOL) 500 MG TABLET    Take 500 mg by mouth every 4 (four) hours as needed for mild pain or headache.    ALUM & MAG HYDROXIDE-SIMETH (MINTOX) 200-200-20 MG/5ML SUSPENSION    Take 30 mLs by mouth every 6 (six) hours as needed for indigestion or heartburn (Not to exceed 4 doses in 24 hours).  ASPIRIN EC 81 MG TABLET    Take 81 mg by mouth daily.   AZELASTINE (OPTIVAR) 0.05 % OPHTHALMIC SOLUTION    Place 1 drop into both eyes 2 (two) times daily.    CARBOXYMETHYLCELLULOSE (LUBRICANT EYE DROPS) 0.5 % SOLN    Place 1 drop into both eyes 4 (four) times daily.    CHOLECALCIFEROL (VITAMIN D) 1000 UNITS TABLET    Take 1,000 Units by mouth daily.   CRANBERRY 450 MG TABS    Take 1 tablet by mouth daily.   LATANOPROST (XALATAN) 0.005 % OPHTHALMIC SOLUTION    Place 1 drop into both eyes at bedtime.    LISINOPRIL (PRINIVIL,ZESTRIL) 20 MG TABLET    Take 1 tablet (20 mg total) by mouth daily. Reported on 02/05/2016   LOPERAMIDE (IMODIUM) 2 MG CAPSULE    Take 2 mg by mouth every 4 (four) hours as needed for diarrhea or loose stools.    MIRTAZAPINE (REMERON) 15 MG TABLET    Take 15 mg by mouth at bedtime.   NUTRITIONAL SUPPLEMENT LIQD    House Supplement - Intake Magic cup (nutritional treat) 4 ounces with meals   NUTRITIONAL SUPPLEMENTS (NUTRITIONAL SUPPLEMENT PO)    House 2.0 - Med  Pass - Give 120 cc by mouth 2 times daily  Modified Medications   No medications on file  Discontinued Medications   No medications on file     SIGNIFICANT DIAGNOSTIC EXAMS  PREVIOUS   03-23-17: chest x-ray: Mild enlargement of the cardiac silhouette without evidence of pulmonary vascular congestion or pulmonary edema. No acute pneumonia. Thoracic aortic atherosclerosis.   03-23-17: ct of head: No acute intracranial abnormality. Atrophy, chronic small vessel disease.  03-24-17: ct of head: Focal hypodensity/ gyral edema in the left posterior frontal lobe with extension into adjacent subcortical and periventricular white matter, suspicious for an area of infarct. There is no hemorrhage or significant mass effect. Atrophy with extensive white matter small vessel ischemic changes.   NO NEW EXAMS   LABS REVIEWED: PREVIOUS    03-23-17: wbc 6.2; hgb 14.6; hct 44.;5 mcv 89.0; plt 217; glucose 104; bun 15; create 1.02; k+ 4.1 ;na++ 141; liver normal albumin 3.7 03-24-17: hgb a1 5.3; chol 122; ldl 73; trig 72; hdl 35   TODAY:  04-21-17: wbc 7.4 hgb 14.7; hct 43.8; mcv 90.8; plt 280    Review of Systems  Unable to perform ROS: Other (aphasia from cva)   Physical Exam  Constitutional: No distress.  thin  Eyes: Conjunctivae are normal.  Neck: Neck supple. No JVD present. No thyromegaly present.  Cardiovascular: Normal rate, regular rhythm and intact distal pulses.   Respiratory: Effort normal and breath sounds normal. No respiratory distress. She has no wheezes.  GI: Soft. Bowel sounds are normal. She exhibits no distension. There is no tenderness.  Musculoskeletal: She exhibits no edema.  Able to move all extremities   Lymphadenopathy:    She has no cervical adenopathy.  Neurological: She is alert.  Skin: Skin is warm and dry. She is not diaphoretic.  Skin is fragile and easily bruised   Psychiatric: She has a normal mood and affect.        ASSESSMENT/ PLAN:  TODAY:   1. CVA:  Is  stable has aphasia will continue asa 81 mg daily  Lipid panel essentially normal; will not make changes will monitor  2. Depression with anxiety: is stable:  will continue remeron 15 mg nightly she is off her seroquel and has tolerated without  any difficulty; will mointor   3. Alzheimer's disease: no significant change in status.  is currently not on medication; her current weight is 134 pounds   Will not make changes will monitor   4. Hypertension: stable; b/p 120/68: will continue lisinopril 20 mg daily  5. Afib: heart rate is stable; dose have a pacemaker will continue asa 81 mg daily      Synthia Innocenteborah Green NP [facility]  Contact [phone] Monday through Friday 8am- 5pm  After hours call [phone]

## 2017-08-01 ENCOUNTER — Non-Acute Institutional Stay (SKILLED_NURSING_FACILITY): Payer: Medicare Other | Admitting: Adult Health

## 2017-08-01 ENCOUNTER — Encounter: Payer: Self-pay | Admitting: Adult Health

## 2017-08-01 DIAGNOSIS — F0280 Dementia in other diseases classified elsewhere without behavioral disturbance: Secondary | ICD-10-CM

## 2017-08-01 DIAGNOSIS — G301 Alzheimer's disease with late onset: Secondary | ICD-10-CM | POA: Diagnosis not present

## 2017-08-01 DIAGNOSIS — F418 Other specified anxiety disorders: Secondary | ICD-10-CM | POA: Diagnosis not present

## 2017-08-01 DIAGNOSIS — I6930 Unspecified sequelae of cerebral infarction: Secondary | ICD-10-CM

## 2017-08-01 DIAGNOSIS — R4701 Aphasia: Secondary | ICD-10-CM

## 2017-08-01 DIAGNOSIS — I10 Essential (primary) hypertension: Secondary | ICD-10-CM

## 2017-08-01 DIAGNOSIS — I482 Chronic atrial fibrillation, unspecified: Secondary | ICD-10-CM

## 2017-08-01 DIAGNOSIS — I639 Cerebral infarction, unspecified: Secondary | ICD-10-CM

## 2017-08-01 NOTE — Progress Notes (Signed)
Location:   [facility] Room Number: 121 A Place of Service:  SNF (31)   CODE STATUS: DNR  Allergies  Allergen Reactions  . Codeine Other (See Comments)    unknown  . Iodine Other (See Comments)    Rash and swelling    Chief Complaint  Patient presents with  . Medical Management of Chronic Issues    cva; depression; alzheimers; hypertension; afib     HPI:  She is a 83 year old long term resident of this facility being seen for the management of her chronic illnesses: cva; depression; Alzheimer's disease; hypertension; afib. She is unable to participate in the phi or ros. There are no reports of behavioral changes; appetite is without change; and no indications of pain present. There are no nursing concerns at this time.   Past Medical History:  Diagnosis Date  . Anxiety   . Coronary artery disease   . Dementia   . Depression   . Dizziness   . Fall   . GERD (gastroesophageal reflux disease)   . Glaucoma   . HX: anticoagulation   . Hypertension   . Osteoarthritis   . Permanent atrial fibrillation (HCC)   . SSS (sick sinus syndrome) (HCC)   . Tachy-brady syndrome [facility](HCC)     Past Surgical History:  Procedure Laterality Date  . BREAST REDUCTION SURGERY    . CARDIOVASCULAR STRESS TEST  02/15/2007  . CHOLECYSTECTOMY    . GASTRIC BYPASS    . INSERT / REPLACE / REMOVE PACEMAKER  01/24/2008   DDD PACEMAKER IMPLANT  . PACEMAKER INSERTION    . PERMANENT PACEMAKER GENERATOR CHANGE N/A 01/29/2013   Procedure: PERMANENT PACEMAKER GENERATOR CHANGE;  Surgeon: Hillis RangeJames Allred, MD;  [facility];  Service: Cardiovascular;  Laterality: N/A;  . TOTAL KNEE ARTHROPLASTY     right  . US ECHOCARDIOGRAPHY  08/10/2007   EF 60%  . US ECHOCARDIOGRAPHY  01/12/2007   EF 70%    Social History   Social History  . Marital status: Widowed    Spouse name: N/A  . Number of children: 2  . Years of education: N/A   Occupational History  . RETIRED Retired   Social  History Main Topics  . Smoking status: Former Smoker    Quit date: 07/17/1961  . Smokeless tobacco: Never Used  . Alcohol use No  . Drug use: No  . Sexual activity: No   Other Topics Concern  . Not on file   Social History Narrative  . No narrative on file   Family History  Problem Relation Age of Onset  . Kidney failure Mother   . Heart attack Father   . Heart attack Sister   . Heart attack Brother       VITAL SIGNS BP 128/86   Pulse 77   Temp 97.6 F (36.4 C)   Resp 18   Ht 5\' 5"  (1.651 m)   Wt 135 lb 12.8 oz (61.6 kg)   SpO2 97%   BMI 22.60 kg/m   Patient's Medications  New Prescriptions   No medications on file  Previous Medications   ACETAMINOPHEN (TYLENOL) 500 MG TABLET    Take 500 mg by mouth every 4 (four) hours as needed for mild pain or headache.    ALUM & MAG HYDROXIDE-SIMETH (MINTOX) 200-200-20 MG/5ML SUSPENSION    Take 30 mLs by mouth every 6 (six) hours as needed for indigestion or heartburn (Not to exceed 4 doses in 24 hours).  ASPIRIN EC 81 MG TABLET    Take 81 mg by mouth daily.   AZELASTINE (OPTIVAR) 0.05 % OPHTHALMIC SOLUTION    Place 1 drop into both eyes 2 (two) times daily.    CARBOXYMETHYLCELLULOSE (LUBRICANT EYE DROPS) 0.5 % SOLN    Place 1 drop into both eyes 4 (four) times daily.    CHOLECALCIFEROL (VITAMIN D) 1000 UNITS TABLET    Take 1,000 Units by mouth daily.   CRANBERRY 450 MG TABS    Take 1 tablet by mouth daily.   LATANOPROST (XALATAN) 0.005 % OPHTHALMIC SOLUTION    Place 1 drop into both eyes at bedtime.    LISINOPRIL (PRINIVIL,ZESTRIL) 20 MG TABLET    Take 1 tablet (20 mg total) by mouth daily. Reported on 02/05/2016   MIRTAZAPINE (REMERON) 15 MG TABLET    Take 15 mg by mouth at bedtime.   NUTRITIONAL SUPPLEMENT LIQD    House Supplement - Intake Magic cup (nutritional treat) 4 ounces with meals   NUTRITIONAL SUPPLEMENTS (NUTRITIONAL SUPPLEMENT PO)    House 2.0 - Med Pass - Give 120 cc by mouth 2 times daily  Modified Medications    No medications on file  Discontinued Medications   No medications on file     SIGNIFICANT DIAGNOSTIC EXAMS  PREVIOUS   03-23-17: chest x-ray: Mild enlargement of the cardiac silhouette without evidence of pulmonary vascular congestion or pulmonary edema. No acute pneumonia. Thoracic aortic atherosclerosis.   03-23-17: ct of head: No acute intracranial abnormality. Atrophy, chronic small vessel disease.  03-24-17: ct of head: Focal hypodensity/ gyral edema in the left posterior frontal lobe with extension into adjacent subcortical and periventricular white matter, suspicious for an area of infarct. There is no hemorrhage or significant mass effect. Atrophy with extensive white matter small vessel ischemic changes.   NO NEW EXAMS   LABS REVIEWED: PREVIOUS    03-23-17: wbc 6.2; hgb 14.6; hct 44.;5 mcv 89.0; plt 217; glucose 104; bun 15; create 1.02; k+ 4.1 ;na++ 141; liver normal albumin 3.7 03-24-17: hgb a1 5.3; chol 122; ldl 73; trig 72; hdl 35  04-21-17: wbc 7.4 hgb 14.7; hct 43.8; mcv 90.8; plt 280  NO NEW LABS    Review of Systems  Unable to perform ROS: Other (aphasia from cva )   Physical Exam  Constitutional: No distress.  Eyes: Conjunctivae are normal.  Neck: Neck supple. No JVD present. No thyromegaly present.  Cardiovascular: Normal rate, regular rhythm and intact distal pulses.   Respiratory: Effort normal and breath sounds normal. No respiratory distress. She has no wheezes.  GI: Soft. Bowel sounds are normal. She exhibits no distension. There is no tenderness.  Musculoskeletal: She exhibits no edema.  Able to move all extremities   Lymphadenopathy:    She has no cervical adenopathy.  Neurological: She is alert.  Skin: Skin is warm and dry. She is not diaphoretic.  Skin is fragile and easily bruised; wears geri sleeves   Psychiatric: She has a normal mood and affect.    ASSESSMENT/ PLAN:  TODAY:   1. CVA:  Is stable has aphasia will continue asa 81 mg daily  Lipid  panel essentially normal; will not make changes will monitor  2. Depression with anxiety: is stable:  will continue remeron 15 mg nightly she is off her seroquel and has tolerated without any difficulty; will mointor   3. Alzheimer's disease: no significant change in status.  is currently not on medication; her current weight is 135 pounds  Will not make changes will monitor   4. Hypertension: stable; b/p 128/86: will continue lisinopril 20 mg daily  5. Afib: heart rate is stable; dose have a pacemaker will continue asa 81 mg daily     MD is aware of resident's narcotic use and is in agreement with current plan of care. We will attempt to wean resident as apropriate     Synthia Innocent NP [facility]  Contact [phone] Monday through Friday 8am- 5pm  After hours call [phone]

## 2017-08-13 DIAGNOSIS — I6930 Unspecified sequelae of cerebral infarction: Secondary | ICD-10-CM | POA: Insufficient documentation

## 2017-08-13 DIAGNOSIS — Z8673 Personal history of transient ischemic attack (TIA), and cerebral infarction without residual deficits: Secondary | ICD-10-CM | POA: Insufficient documentation

## 2017-09-18 ENCOUNTER — Non-Acute Institutional Stay (SKILLED_NURSING_FACILITY): Payer: Medicare Other

## 2017-09-18 DIAGNOSIS — Z0000 Encounter for general adult medical examination without abnormal findings: Secondary | ICD-10-CM | POA: Diagnosis not present

## 2017-09-18 NOTE — Patient Instructions (Signed)
Alison Dodson , Thank you for taking time to come for your Medicare Wellness Visit. I appreciate your ongoing commitment to your health goals. Please review the following plan we discussed and let me know if I can assist you in the future.   Screening recommendations/referrals: Colonoscopy excluded, pt over age 81 Mammogram excluded, pt over age 81 Bone Density up to date Recommended yearly ophthalmology/optometry visit for glaucoma screening and checkup Recommended yearly dental visit for hygiene and checkup  Vaccinations: Influenza vaccine due, facility will give it Pneumococcal vaccine 23 due, ordered Tdap vaccine due, not ordered facility declined Shingles vaccine not in records    Advanced directives: In Chart  Conditions/risks identified: none  Next appointment: Dr. Montez Moritaarter makes rounds   Preventive Care 65 Years and Older, Female Preventive care refers to lifestyle choices and visits with your health care provider that can promote health and wellness. What does preventive care include?  A yearly physical exam. This is also called an annual well check.  Dental exams once or twice a year.  Routine eye exams. Ask your health care provider how often you should have your eyes checked.  Personal lifestyle choices, including:  Daily care of your teeth and gums.  Regular physical activity.  Eating a healthy diet.  Avoiding tobacco and drug use.  Limiting alcohol use.  Practicing safe sex.  Taking low-dose aspirin every day.  Taking vitamin and mineral supplements as recommended by your health care provider. What happens during an annual well check? The services and screenings done by your health care provider during your annual well check will depend on your age, overall health, lifestyle risk factors, and family history of disease. Counseling  Your health care provider may ask you questions about your:  Alcohol use.  Tobacco use.  Drug use.  Emotional  well-being.  Home and relationship well-being.  Sexual activity.  Eating habits.  History of falls.  Memory and ability to understand (cognition).  Work and work Astronomerenvironment.  Reproductive health. Screening  You may have the following tests or measurements:  Height, weight, and BMI.  Blood pressure.  Lipid and cholesterol levels. These may be checked every 5 years, or more frequently if you are over 81 years old.  Skin check.  Lung cancer screening. You may have this screening every year starting at age 81 if you have a 30-pack-year history of smoking and currently smoke or have quit within the past 15 years.  Fecal occult blood test (FOBT) of the stool. You may have this test every year starting at age 81.  Flexible sigmoidoscopy or colonoscopy. You may have a sigmoidoscopy every 5 years or a colonoscopy every 10 years starting at age 81.  Hepatitis C blood test.  Hepatitis B blood test.  Sexually transmitted disease (STD) testing.  Diabetes screening. This is done by checking your blood sugar (glucose) after you have not eaten for a while (fasting). You may have this done every 1-3 years.  Bone density scan. This is done to screen for osteoporosis. You may have this done starting at age 81.  Mammogram. This may be done every 1-2 years. Talk to your health care provider about how often you should have regular mammograms. Talk with your health care provider about your test results, treatment options, and if necessary, the need for more tests. Vaccines  Your health care provider may recommend certain vaccines, such as:  Influenza vaccine. This is recommended every year.  Tetanus, diphtheria, and acellular pertussis (Tdap, Td) vaccine.  You may need a Td booster every 10 years.  Zoster vaccine. You may need this after age 22.  Pneumococcal 13-valent conjugate (PCV13) vaccine. One dose is recommended after age 76.  Pneumococcal polysaccharide (PPSV23) vaccine. One  dose is recommended after age 69. Talk to your health care provider about which screenings and vaccines you need and how often you need them. This information is not intended to replace advice given to you by your health care provider. Make sure you discuss any questions you have with your health care provider. Document Released: 12/04/2015 Document Revised: 07/27/2016 Document Reviewed: 09/08/2015 Elsevier Interactive Patient Education  2017 [place] Prevention in the Home Falls can cause injuries. They can happen to people of all ages. There are many things you can do to make your home safe and to help prevent falls. What can I do on the outside of my home?  Regularly fix the edges of walkways and driveways and fix any cracks.  Remove anything that might make you trip as you walk through a door, such as a raised step or threshold.  Trim any bushes or trees on the path to your home.  Use bright outdoor lighting.  Clear any walking paths of anything that might make someone trip, such as rocks or tools.  Regularly check to see if handrails are loose or broken. Make sure that both sides of any steps have handrails.  Any raised decks and porches should have guardrails on the edges.  Have any leaves, snow, or ice cleared regularly.  Use sand or salt on walking paths during winter.  Clean up any spills in your garage right away. This includes oil or grease spills. What can I do in the bathroom?  Use night lights.  Install grab bars by the toilet and in the tub and shower. Do not use towel bars as grab bars.  Use non-skid mats or decals in the tub or shower.  If you need to sit down in the shower, use a plastic, non-slip stool.  Keep the floor dry. Clean up any water that spills on the floor as soon as it happens.  Remove soap buildup in the tub or shower regularly.  Attach bath mats securely with double-sided non-slip rug tape.  Do not have throw rugs and other  things on the floor that can make you trip. What can I do in the bedroom?  Use night lights.  Make sure that you have a light by your bed that is easy to reach.  Do not use any sheets or blankets that are too big for your bed. They should not hang down onto the floor.  Have a firm chair that has side arms. You can use this for support while you get dressed.  Do not have throw rugs and other things on the floor that can make you trip. What can I do in the kitchen?  Clean up any spills right away.  Avoid walking on wet floors.  Keep items that you use a lot in easy-to-reach places.  If you need to reach something above you, use a strong step stool that has a grab bar.  Keep electrical cords out of the way.  Do not use floor polish or wax that makes floors slippery. If you must use wax, use non-skid floor wax.  Do not have throw rugs and other things on the floor that can make you trip. What can I do with my stairs?  Do not leave any  items on the stairs.  Make sure that there are handrails on both sides of the stairs and use them. Fix handrails that are broken or loose. Make sure that handrails are as long as the stairways.  Check any carpeting to make sure that it is firmly attached to the stairs. Fix any carpet that is loose or worn.  Avoid having throw rugs at the top or bottom of the stairs. If you do have throw rugs, attach them to the floor with carpet tape.  Make sure that you have a light switch at the top of the stairs and the bottom of the stairs. If you do not have them, ask someone to add them for you. What else can I do to help prevent falls?  Wear shoes that:  Do not have high heels.  Have rubber bottoms.  Are comfortable and fit you well.  Are closed at the toe. Do not wear sandals.  If you use a stepladder:  Make sure that it is fully opened. Do not climb a closed stepladder.  Make sure that both sides of the stepladder are locked into place.  Ask  someone to hold it for you, if possible.  Clearly mark and make sure that you can see:  Any grab bars or handrails.  First and last steps.  Where the edge of each step is.  Use tools that help you move around (mobility aids) if they are needed. These include:  Canes.  Walkers.  Scooters.  Crutches.  Turn on the lights when you go into a dark area. Replace any light bulbs as soon as they burn out.  Set up your furniture so you have a clear path. Avoid moving your furniture around.  If any of your floors are uneven, fix them.  If there are any pets around you, be aware of where they are.  Review your medicines with your doctor. Some medicines can make you feel dizzy. This can increase your chance of falling. Ask your doctor what other things that you can do to help prevent falls. This information is not intended to replace advice given to you by your health care provider. Make sure you discuss any questions you have with your health care provider. Document Released: 09/03/2009 Document Revised: 04/14/2016 Document Reviewed: 12/12/2014 Elsevier Interactive Patient Education  2017 [employer].

## 2017-09-18 NOTE — Progress Notes (Signed)
Subjective:   Alison Dodson is a 83 y.o. female who presents for an Initial Medicare Annual Wellness Visit at [facility]; incapacitated patient unable to answer questions appropriately        Objective:    Today's Vitals   09/18/17 1402  BP: 132/74  Pulse: 76  Temp: (!) 96.7 F (35.9 C)  TempSrc: Axillary  SpO2: 96%  Weight: 135 lb (61.2 kg)  Height: 5\' 5"  (1.651 m)   Body mass index is 22.47 kg/m.   Current Medications (verified) Outpatient Encounter Prescriptions as of 09/18/2017  Medication Sig  . acetaminophen (TYLENOL) 500 MG tablet Take 500 mg by mouth every 4 (four) hours as needed for mild pain or headache.   Marland Kitchen. alum & mag hydroxide-simeth (MINTOX) 200-200-20 MG/5ML suspension Take 30 mLs by mouth every 6 (six) hours as needed for indigestion or heartburn (Not to exceed 4 doses in 24 hours).  Marland Kitchen. aspirin EC 81 MG tablet Take 81 mg by mouth daily.  Marland Kitchen. azelastine (OPTIVAR) 0.05 % ophthalmic solution Place 1 drop into both eyes 2 (two) times daily.   . carboxymethylcellulose (LUBRICANT EYE DROPS) 0.5 % SOLN Place 1 drop into both eyes 4 (four) times daily.   . cholecalciferol (VITAMIN D) 1000 UNITS tablet Take 1,000 Units by mouth daily.  . Cranberry 450 MG TABS Take 1 tablet by mouth daily.  Marland Kitchen. latanoprost (XALATAN) 0.005 % ophthalmic solution Place 1 drop into both eyes at bedtime.   Marland Kitchen. lisinopril (PRINIVIL,ZESTRIL) 20 MG tablet Take 1 tablet (20 mg total) by mouth daily. Reported on 02/05/2016  . mirtazapine (REMERON) 15 MG tablet Take 15 mg by mouth at bedtime.  Marland Kitchen. NUTRITIONAL SUPPLEMENT LIQD House Supplement - Intake Magic cup (nutritional treat) 4 ounces with meals  . Nutritional Supplements (NUTRITIONAL SUPPLEMENT PO) House 2.0 - Med Pass - Give 120 cc by mouth 2 times daily   Facility-Administered Encounter Medications as of 09/18/2017  Medication  . 0.45 % sodium chloride infusion  . 0.9 %  sodium chloride infusion  . chlorhexidine (HIBICLENS) 4 %  liquid 4 application  . sodium chloride 0.9 % injection 3 mL  . sodium chloride 0.9 % injection 3 mL    Allergies (verified) Codeine and Iodine   History: Past Medical History:  Diagnosis Date  . Anxiety   . Coronary artery disease   . Dementia   . Depression   . Dizziness   . Fall   . GERD (gastroesophageal reflux disease)   . Glaucoma   . HX: anticoagulation   . Hypertension   . Osteoarthritis   . Permanent atrial fibrillation (HCC)   . SSS (sick sinus syndrome) (HCC)   . Tachy-brady syndrome [facility](HCC)    Past Surgical History:  Procedure Laterality Date  . BREAST REDUCTION SURGERY    . CARDIOVASCULAR STRESS TEST  02/15/2007  . CHOLECYSTECTOMY    . GASTRIC BYPASS    . INSERT / REPLACE / REMOVE PACEMAKER  01/24/2008   DDD PACEMAKER IMPLANT  . PACEMAKER INSERTION    . PERMANENT PACEMAKER GENERATOR CHANGE N/A 01/29/2013   Procedure: PERMANENT PACEMAKER GENERATOR CHANGE;  Surgeon: Hillis RangeJames Allred, MD;  [facility];  Service: Cardiovascular;  Laterality: N/A;  . TOTAL KNEE ARTHROPLASTY     right  . US ECHOCARDIOGRAPHY  08/10/2007   EF 60%  . US ECHOCARDIOGRAPHY  01/12/2007   EF 70%   Family History  Problem Relation Age of Onset  . Kidney failure Mother   .  Heart attack Father   . Heart attack Sister   . Heart attack Brother    Social History   Occupational History  . RETIRED Retired   Social History Main Topics  . Smoking status: Former Smoker    Quit date: 07/17/1961  . Smokeless tobacco: Never Used  . Alcohol use No  . Drug use: No  . Sexual activity: No    Tobacco Counseling Counseling given: Not Answered   Activities of Daily Living In your present state of health, do you have any difficulty performing the following activities: 09/18/2017 03/25/2017  Hearing? Y N  Vision? Y N  Difficulty concentrating or making decisions? Malvin Johns  Walking or climbing stairs? Y Y  Dressing or bathing? Y Y  Doing errands, shopping? Malvin Johns  Preparing Food and eating ? Y  -  Using the Toilet? Y -  In the past six months, have you accidently leaked urine? Y -  Do you have problems with loss of bowel control? Y -  Managing your Medications? Y -  Managing your Finances? Y -  Housekeeping or managing your Housekeeping? Y -  Some recent data might be hidden    Immunizations and Health Maintenance Immunization History  Administered Date(s) Administered  . PPD Test 04/05/2017  . Td 12/23/2013   There are no preventive care reminders to display for this patient.  Patient Care Team: Ron Parker, MD as PCP - General (Internal Medicine) Hillis Range, MD as Attending Physician (Cardiology)  Indicate any recent Medical Services you may have received from other than [facility] providers in the past year (date may be approximate).     Assessment:   This is a routine wellness examination for Alison Dodson.    Hearing/Vision screen No exam data present  Dietary issues and exercise activities discussed: Current Exercise Habits: The patient does not participate in regular exercise at present, Exercise limited by: neurologic condition(s)  Goals    None     Depression Screen PHQ 2/9 Scores 09/18/2017  Exception Documentation Medical reason    Fall Risk Fall Risk  09/18/2017  Falls in the past year? No    Cognitive Function: MMSE - Mini Mental State Exam 09/18/2017  Not completed: Unable to complete        Screening Tests Health Maintenance  Topic Date Due  . INFLUENZA VACCINE  10/03/2017 (Originally 06/21/2017)  . PNA vac Low Risk Adult (1 of 2 - PCV13) 03/29/2018 (Originally 01/31/1995)  . TETANUS/TDAP  12/24/2023  . DEXA SCAN  Completed      Plan:    I have personally reviewed and addressed the Medicare Annual Wellness questionnaire and have noted the following in the patient's chart:  A. Medical and social history B. Use of alcohol, tobacco or illicit drugs  C. Current medications and supplements D. Functional ability and status E.  Nutritional  status F.  Physical activity G. Advance directives H. List of other physicians I.  Hospitalizations, surgeries, and ER visits in previous 12 months J.  Vitals K. Screenings to include hearing, vision, cognitive, depression L. Referrals and appointments - none  In addition, I am unable to review and discuss with incapacitated patient certain preventive protocols, quality metrics, and best practice recommendations. A written personalized care plan for preventive services as well as general preventive health recommendations were provided to patient.   See attached scanned questionnaire for additional information.   Signed,   Annetta Maw, RN Nurse Health Advisor   Quick Notes   Health Maintenance:  Nursing facility will giving flu vaccine. PNA 23 and tdap due. TDAP not ordered-facility declined     Abnormal Screen: Unable to complete mental exam.     Patient Concerns: None     Nurse Concerns: None

## 2017-10-05 ENCOUNTER — Encounter: Payer: Self-pay | Admitting: Adult Health

## 2017-10-05 ENCOUNTER — Non-Acute Institutional Stay (SKILLED_NURSING_FACILITY): Payer: Medicare Other | Admitting: Adult Health

## 2017-10-05 DIAGNOSIS — I482 Chronic atrial fibrillation, unspecified: Secondary | ICD-10-CM

## 2017-10-05 DIAGNOSIS — G301 Alzheimer's disease with late onset: Secondary | ICD-10-CM | POA: Diagnosis not present

## 2017-10-05 DIAGNOSIS — I10 Essential (primary) hypertension: Secondary | ICD-10-CM | POA: Diagnosis not present

## 2017-10-05 DIAGNOSIS — R4701 Aphasia: Secondary | ICD-10-CM

## 2017-10-05 DIAGNOSIS — F0280 Dementia in other diseases classified elsewhere without behavioral disturbance: Secondary | ICD-10-CM

## 2017-10-05 DIAGNOSIS — I639 Cerebral infarction, unspecified: Secondary | ICD-10-CM

## 2017-10-05 DIAGNOSIS — I6930 Unspecified sequelae of cerebral infarction: Secondary | ICD-10-CM | POA: Diagnosis not present

## 2017-10-05 DIAGNOSIS — F418 Other specified anxiety disorders: Secondary | ICD-10-CM | POA: Diagnosis not present

## 2017-10-05 NOTE — Progress Notes (Signed)
Location:   [facility] Room Number: 110 A Place of Service:      CODE STATUS: DNR  Allergies  Allergen Reactions  . Codeine Other (See Comments)    unknown  . Iodine Other (See Comments)    Rash and swelling    Chief Complaint  Patient presents with  . Medical Management of Chronic Issues    Cva; depression with anxiety; Alzheimer disease; hypertension; afib.     HPI:  She is a long term resident of this facility being seen for the management of her chronic illnesses: aphasia due to acute cva; essential hypertension benign; chronic afib; alzheimer's disease; depression with anxiety. She is unable to participate in the hpi or ros. There are no reports of any behavioral issues; changes in appetite; no reports of pain present. There are no nursing concerns at this time.    Past Medical History:  Diagnosis Date  . Anxiety   . Coronary artery disease   . Dementia   . Depression   . Dizziness   . Fall   . GERD (gastroesophageal reflux disease)   . Glaucoma   . HX: anticoagulation   . Hypertension   . Osteoarthritis   . Permanent atrial fibrillation (HCC)   . SSS (sick sinus syndrome) (HCC)   . Tachy-brady syndrome [facility](HCC)     Past Surgical History:  Procedure Laterality Date  . BREAST REDUCTION SURGERY    . CARDIOVASCULAR STRESS TEST  02/15/2007  . CHOLECYSTECTOMY    . GASTRIC BYPASS    . INSERT / REPLACE / REMOVE PACEMAKER  01/24/2008   DDD PACEMAKER IMPLANT  . PACEMAKER INSERTION    . PERMANENT PACEMAKER GENERATOR CHANGE N/A 01/29/2013   Procedure: PERMANENT PACEMAKER GENERATOR CHANGE;  Surgeon: Hillis RangeJames Allred, MD;  [facility];  Service: Cardiovascular;  Laterality: N/A;  . TOTAL KNEE ARTHROPLASTY     right  . US ECHOCARDIOGRAPHY  08/10/2007   EF 60%  . US ECHOCARDIOGRAPHY  01/12/2007   EF 70%    Social History   Socioeconomic History  . Marital status: Widowed    Spouse name: Not on file  . Number of children: 2  . Years of education:  Not on file  . Highest education level: Not on file  Social Needs  . Financial resource strain: Not on file  . Food insecurity - worry: Not on file  . Food insecurity - inability: Not on file  . Transportation needs - medical: Not on file  . Transportation needs - non-medical: Not on file  Occupational History  . Occupation: RETIRED    Employer: RETIRED  Tobacco Use  . Smoking status: Former Smoker    Last attempt to quit: 07/17/1961    Years since quitting: 56.2  . Smokeless tobacco: Never Used  Substance and Sexual Activity  . Alcohol use: No  . Drug use: No  . Sexual activity: No  Other Topics Concern  . Not on file  Social History Narrative  . Not on file   Family History  Problem Relation Age of Onset  . Kidney failure Mother   . Heart attack Father   . Heart attack Sister   . Heart attack Brother       VITAL SIGNS BP 116/75   Pulse 84   Temp 97.8 F (36.6 C)   Resp 20   Ht 5\' 5"  (1.651 m)   Wt 135 lb 12.8 oz (61.6 kg)   SpO2 94%   BMI 22.60 kg/m  Medication List        Accurate as of 10/05/17 11:38 AM. Always use your most recent med list.          acetaminophen 500 MG tablet Commonly known as:  TYLENOL   aspirin EC 81 MG tablet   azelastine 0.05 % ophthalmic solution Commonly known as:  OPTIVAR   cholecalciferol 1000 units tablet Commonly known as:  VITAMIN D   Cranberry 450 MG Tabs   latanoprost 0.005 % ophthalmic solution Commonly known as:  XALATAN   lisinopril 20 MG tablet Commonly known as:  PRINIVIL,ZESTRIL Take 1 tablet (20 mg total) by mouth daily. Reported on 02/05/2016   LUBRICANT EYE DROPS 0.5 % Soln Generic drug:  carboxymethylcellulose   MINTOX 200-200-20 MG/5ML suspension Generic drug:  alum & mag hydroxide-simeth   mirtazapine 15 MG tablet Commonly known as:  REMERON   * NUTRITIONAL SUPPLEMENT Liqd   * NUTRITIONAL SUPPLEMENT PO      * This list has 2 medication(s) that are the same as other medications  prescribed for you. Read the directions carefully, and ask your doctor or other care provider to review them with you.           SIGNIFICANT DIAGNOSTIC EXAMS  PREVIOUS   03-23-17: chest x-ray: Mild enlargement of the cardiac silhouette without evidence of pulmonary vascular congestion or pulmonary edema. No acute pneumonia. Thoracic aortic atherosclerosis.   03-23-17: ct of head: No acute intracranial abnormality. Atrophy, chronic small vessel disease.  03-24-17: ct of head: Focal hypodensity/ gyral edema in the left posterior frontal lobe with extension into adjacent subcortical and periventricular white matter, suspicious for an area of infarct. There is no hemorrhage or significant mass effect. Atrophy with extensive white matter small vessel ischemic changes.   NO NEW EXAMS   LABS REVIEWED: PREVIOUS    03-23-17: wbc 6.2; hgb 14.6; hct 44.;5 mcv 89.0; plt 217; glucose 104; bun 15; create 1.02; k+ 4.1 ;na++ 141; liver normal albumin 3.7 03-24-17: hgb a1 5.3; chol 122; ldl 73; trig 72; hdl 35  04-21-17: wbc 7.4 hgb 14.7; hct 43.8; mcv 90.8; plt 280  NO NEW LABS    Review of Systems  Unable to perform ROS: Dementia (unable to participate )    Physical Exam  Constitutional: She appears well-developed and well-nourished. No distress.  Neck: Neck supple. No thyromegaly present.  Cardiovascular: Normal rate, regular rhythm and intact distal pulses.  Murmur heard. 1/6  Pulmonary/Chest: Effort normal and breath sounds normal. No stridor. No respiratory distress.  Abdominal: Soft. Bowel sounds are normal. She exhibits no distension.  Musculoskeletal: She exhibits no edema.  Is able to move all extremities   Lymphadenopathy:    She has no cervical adenopathy.  Neurological: She is alert.  Skin: Skin is warm and dry. She is not diaphoretic.  Psychiatric: She has a normal mood and affect.   ASSESSMENT/ PLAN:  TODAY:    1. CVA:  Is stable has aphasia will continue asa 81 mg daily   Lipid panel essentially normal; will not make changes will monitor  2. Depression with anxiety: is stable:  will continue remeron 15 mg nightly she is off her seroquel and has tolerated without any difficulty; will mointor   3. Alzheimer's disease: no significant change in status.  is currently not on medication; her current weight is 135 pounds   Will not make changes will monitor   4. Hypertension: stable; b/p 116/75: will continue lisinopril 20 mg daily  5. Afib: heart  rate is stable; dose have a pacemaker will continue asa 81 mg daily     MD is aware of resident's narcotic use and is in agreement with current plan of care. We will attempt to wean resident as apropriate     Synthia Innocenteborah Sabriel Borromeo NP [facility]  Contact [phone] Monday through Friday 8am- 5pm  After hours call [phone]

## 2017-10-26 ENCOUNTER — Encounter: Payer: Self-pay | Admitting: Adult Health

## 2017-10-26 ENCOUNTER — Non-Acute Institutional Stay (SKILLED_NURSING_FACILITY): Payer: Medicare Other | Admitting: Adult Health

## 2017-10-26 DIAGNOSIS — I10 Essential (primary) hypertension: Secondary | ICD-10-CM

## 2017-10-26 DIAGNOSIS — F418 Other specified anxiety disorders: Secondary | ICD-10-CM | POA: Diagnosis not present

## 2017-10-26 DIAGNOSIS — I6930 Unspecified sequelae of cerebral infarction: Secondary | ICD-10-CM

## 2017-10-26 NOTE — Progress Notes (Addendum)
Location:   [facility] Room Number: 110 A Place of Service:  SNF (31)   CODE STATUS: DNR  Allergies  Allergen Reactions  . Codeine Other (See Comments)    unknown  . Iodine Other (See Comments)    Rash and swelling    Chief Complaint  Patient presents with  . Medical Management of Chronic Issues    Cva; depression; alzheimer's disease    HPI:  She is an 83 year old long term resident of this facility being seen for the management of her chronic illnesses:  Essential hypertension, benign;  Chronic cerebrovascular disease; and depression with anxiety. She is unable to participate in the hpi or ros. There are no reports of any changes in mood state; no changes in behaviors; no changes in appetite. There are no nursing concerns at this time.   Past Medical History:  Diagnosis Date  . Anxiety   . Coronary artery disease   . Dementia   . Depression   . Dizziness   . Fall   . GERD (gastroesophageal reflux disease)   . Glaucoma   . HX: anticoagulation   . Hypertension   . Osteoarthritis   . Permanent atrial fibrillation (HCC)   . SSS (sick sinus syndrome) (HCC)   . Tachy-brady syndrome [facility](HCC)     Past Surgical History:  Procedure Laterality Date  . BREAST REDUCTION SURGERY    . CARDIOVASCULAR STRESS TEST  02/15/2007  . CHOLECYSTECTOMY    . GASTRIC BYPASS    . INSERT / REPLACE / REMOVE PACEMAKER  01/24/2008   DDD PACEMAKER IMPLANT  . PACEMAKER INSERTION    . PERMANENT PACEMAKER GENERATOR CHANGE N/A 01/29/2013   Procedure: PERMANENT PACEMAKER GENERATOR CHANGE;  Surgeon: Hillis RangeJames Allred, MD;  [facility];  Service: Cardiovascular;  Laterality: N/A;  . TOTAL KNEE ARTHROPLASTY     right  . US ECHOCARDIOGRAPHY  08/10/2007   EF 60%  . US ECHOCARDIOGRAPHY  01/12/2007   EF 70%    Social History   Socioeconomic History  . Marital status: Widowed    Spouse name: Not on file  . Number of children: 2  . Years of education: Not on file  . Highest  education level: Not on file  Social Needs  . Financial resource strain: Not on file  . Food insecurity - worry: Not on file  . Food insecurity - inability: Not on file  . Transportation needs - medical: Not on file  . Transportation needs - non-medical: Not on file  Occupational History  . Occupation: RETIRED    Employer: RETIRED  Tobacco Use  . Smoking status: Former Smoker    Last attempt to quit: 07/17/1961    Years since quitting: 56.3  . Smokeless tobacco: Never Used  Substance and Sexual Activity  . Alcohol use: No  . Drug use: No  . Sexual activity: No  Other Topics Concern  . Not on file  Social History Narrative  . Not on file   Family History  Problem Relation Age of Onset  . Kidney failure Mother   . Heart attack Father   . Heart attack Sister   . Heart attack Brother       VITAL SIGNS BP 102/60   Pulse 78   Temp 97.8 F (36.6 C)   Resp 18   Ht 5\' 5"  (1.651 m)   Wt 135 lb 12.8 oz (61.6 kg)   SpO2 98%   BMI 22.60 kg/m   Outpatient Encounter  Medications as of 10/26/2017  Medication Sig  . acetaminophen (TYLENOL) 500 MG tablet Take 500 mg by mouth every 4 (four) hours as needed for mild pain or headache.   Marland Kitchen. alum & mag hydroxide-simeth (MINTOX) 200-200-20 MG/5ML suspension Take 30 mLs by mouth every 6 (six) hours as needed for indigestion or heartburn (Not to exceed 4 doses in 24 hours).  Marland Kitchen. aspirin EC 81 MG tablet Take 81 mg by mouth daily.  Marland Kitchen. azelastine (OPTIVAR) 0.05 % ophthalmic solution Place 1 drop into both eyes 2 (two) times daily.   . carboxymethylcellulose (LUBRICANT EYE DROPS) 0.5 % SOLN Place 1 drop into both eyes 4 (four) times daily.   . cholecalciferol (VITAMIN D) 1000 UNITS tablet Take 1,000 Units by mouth daily.  . Cranberry 450 MG TABS Take 1 tablet by mouth daily.  Marland Kitchen. latanoprost (XALATAN) 0.005 % ophthalmic solution Place 1 drop into both eyes at bedtime.   Marland Kitchen. lisinopril (PRINIVIL,ZESTRIL) 20 MG tablet Take 1 tablet (20 mg total) by  mouth daily. Reported on 02/05/2016  . mirtazapine (REMERON) 15 MG tablet Take 15 mg by mouth at bedtime.  Marland Kitchen. NUTRITIONAL SUPPLEMENT LIQD House Supplement - Intake Magic cup (nutritional treat) 4 ounces with meals  . Nutritional Supplements (NUTRITIONAL SUPPLEMENT PO) House 2.0 - Med Pass - Give 120 cc by mouth 2 times daily   No facility-administered encounter medications on file as of 10/26/2017.      SIGNIFICANT DIAGNOSTIC EXAMS  PREVIOUS   03-23-17: chest x-ray: Mild enlargement of the cardiac silhouette without evidence of pulmonary vascular congestion or pulmonary edema. No acute pneumonia. Thoracic aortic atherosclerosis.   03-23-17: ct of head: No acute intracranial abnormality. Atrophy, chronic small vessel disease.  03-24-17: ct of head: Focal hypodensity/ gyral edema in the left posterior frontal lobe with extension into adjacent subcortical and periventricular white matter, suspicious for an area of infarct. There is no hemorrhage or significant mass effect. Atrophy with extensive white matter small vessel ischemic changes.   NO NEW EXAMS   LABS REVIEWED: PREVIOUS    03-23-17: wbc 6.2; hgb 14.6; hct 44.;5 mcv 89.0; plt 217; glucose 104; bun 15; creat 1.02; k+ 4.1 ;na++ 141; liver normal albumin 3.7 03-24-17: hgb a1 5.3; chol 122; ldl 73; trig 72; hdl 35  04-21-17: wbc 7.4 hgb 14.7; hct 43.8; mcv 90.8; plt 280  NO NEW LABS    Review of Systems  Unable to perform ROS: Dementia (unable to participate )   Physical Exam  Constitutional: She appears well-developed and well-nourished. No distress.  Neck: No thyromegaly present.  Cardiovascular: Normal rate, regular rhythm and intact distal pulses.  Murmur heard. 1/6  Pulmonary/Chest: Effort normal and breath sounds normal. No respiratory distress.  Abdominal: Soft. Bowel sounds are normal. She exhibits no distension. There is no tenderness.  Musculoskeletal: Normal range of motion. She exhibits no edema.  Lymphadenopathy:    She  has no cervical adenopathy.  Neurological: She is alert.  Skin: Skin is warm and dry. She is not diaphoretic.  Psychiatric: She has a normal mood and affect.    ASSESSMENT/ PLAN:  TODAY:    1. CVA:  Is stable has aphasia will continue asa 81 mg daily  Lipid panel essentially normal; will not make changes will monitor  2. Depression with anxiety: is stable:  will continue remeron 15 mg nightly she is off her seroquel and has tolerated without any difficulty; will mointor   She is due for her GDR: at this time lowering her  medication could cause her emotional harm. She does engage with those around her.   3. Alzheimer's disease: no significant change in status.  is currently not on medication; her current weight is 135 pounds   Will not make changes will monitor  PREVIOUS    4. Hypertension: stable; b/p 102/60: will continue lisinopril 20 mg daily  5. Afib: heart rate is stable; dose have a pacemaker will continue asa 81 mg daily    Will check cbc; cmp    MD is aware of resident's narcotic use and is in agreement with current plan of care. We will attempt to wean resident as apropriate     Synthia Innocent NP [facility]  Contact [phone] Monday through Friday 8am- 5pm  After hours call [phone]

## 2017-10-27 LAB — HEPATIC FUNCTION PANEL
ALK PHOS: 131 — AB (ref 25–125)
ALT: 5 — AB (ref 7–35)
AST: 14 (ref 13–35)
BILIRUBIN, TOTAL: 0.4

## 2017-10-27 LAB — CBC AND DIFFERENTIAL
HCT: 41 (ref 36–46)
Hemoglobin: 13.3 (ref 12.0–16.0)
NEUTROS ABS: 4
PLATELETS: 283 (ref 150–399)
WBC: 6.9

## 2017-10-27 LAB — BASIC METABOLIC PANEL
BUN: 23 — AB (ref 4–21)
CREATININE: 0.9 (ref 0.5–1.1)
GLUCOSE: 82
Potassium: 4.4 (ref 3.4–5.3)
Sodium: 143 (ref 137–147)

## 2017-11-23 ENCOUNTER — Encounter: Payer: Self-pay | Admitting: Internal Medicine

## 2017-11-23 ENCOUNTER — Non-Acute Institutional Stay (SKILLED_NURSING_FACILITY): Payer: Medicare Other | Admitting: Internal Medicine

## 2017-11-23 DIAGNOSIS — G301 Alzheimer's disease with late onset: Secondary | ICD-10-CM | POA: Diagnosis not present

## 2017-11-23 DIAGNOSIS — I480 Paroxysmal atrial fibrillation: Secondary | ICD-10-CM | POA: Diagnosis not present

## 2017-11-23 DIAGNOSIS — F0280 Dementia in other diseases classified elsewhere without behavioral disturbance: Secondary | ICD-10-CM | POA: Diagnosis not present

## 2017-11-23 DIAGNOSIS — I10 Essential (primary) hypertension: Secondary | ICD-10-CM

## 2017-11-23 DIAGNOSIS — F418 Other specified anxiety disorders: Secondary | ICD-10-CM

## 2017-11-23 DIAGNOSIS — R4701 Aphasia: Secondary | ICD-10-CM

## 2017-11-23 DIAGNOSIS — I639 Cerebral infarction, unspecified: Secondary | ICD-10-CM | POA: Diagnosis not present

## 2017-11-23 NOTE — Progress Notes (Signed)
Patient ID: Alison Dodson, female   DOB: 22-Apr-1928, 84 y.o.   MRN: [id number]   Location:  [facility] Room Number: 110 A Place of Service:  SNF (31) Provider:  DR Erubiel Manasco Verlee Rossetti, MD  Patient Care Team: Ron Parker, MD as PCP - General (Internal Medicine) Hillis Range, MD as Attending Physician (Cardiology)  Extended Emergency Contact Information Primary Emergency Contact: Sullivan,Judy Address: [address]          [place], [place] [address] [place] Home Phone: [phone] Mobile Phone: [phone] Relation: Daughter  Code Status:  DNR Goals of care: Advanced Directive information Advanced Directives 11/23/2017  Does Patient Have a Medical Advance Directive? Yes  Type of Estate agent of [place];Out of facility DNR (pink MOST or yellow form)  Does patient want to make changes to medical advance directive? No - Patient declined  Copy of Healthcare Power of Attorney in Chart? Yes  Would patient like information on creating a medical advance directive? -  Pre-existing out of facility DNR order (yellow form or pink MOST form) Yellow form placed in chart (order not valid for inpatient use);Pink MOST form placed in chart (order not valid for inpatient use)     Chief Complaint  Patient presents with  . Medical Management of Chronic Issues    Routine Visit    HPI:  Pt is a 84 y.o. female seen today for medical management of chronic diseases.  She has no concerns. Appetite ok and sleeps well. No nursing issues. No falls. She is a poor historian due to dementia. Hx obtained from chart.  Hx CVA - she has aphasia; takes ASA 81 mg daily; LDL 73; HDL 35  Depression with anxiety - mood stable on remeron 15 mg nightly; she is off seroquel; GDR not indicated at this time as lowering her medication could cause her emotional harm. She does engage with those around her.   Alzheimer's disease - unchanged; she  does not take any medication for cognition. Weight is stable; she gets nutritional supplements.    Hypertension - BP stable on lisinopril 20 mg daily  PAF - s/p pacemaker; rate controlled. Takes ASA  81 mg daily     Past Medical History:  Diagnosis Date  . Anxiety   . Coronary artery disease   . Dementia   . Depression   . Dizziness   . Fall   . GERD (gastroesophageal reflux disease)   . Glaucoma   . HX: anticoagulation   . Hypertension   . Osteoarthritis   . Permanent atrial fibrillation (HCC)   . SSS (sick sinus syndrome) (HCC)   . Tachy-brady syndrome [facility])    Past Surgical History:  Procedure Laterality Date  . BREAST REDUCTION SURGERY    . CARDIOVASCULAR STRESS TEST  02/15/2007  . CHOLECYSTECTOMY    . GASTRIC BYPASS    . INSERT / REPLACE / REMOVE PACEMAKER  01/24/2008   DDD PACEMAKER IMPLANT  . PACEMAKER INSERTION    . PERMANENT PACEMAKER GENERATOR CHANGE N/A 01/29/2013   Procedure: PERMANENT PACEMAKER GENERATOR CHANGE;  Surgeon: Hillis Range, MD;  Location: [facility] CATH LAB;  Service: Cardiovascular;  Laterality: N/A;  . TOTAL KNEE ARTHROPLASTY     right  . US ECHOCARDIOGRAPHY  08/10/2007   EF 60%  . US ECHOCARDIOGRAPHY  01/12/2007   EF 70%    Allergies  Allergen Reactions  . Codeine Other (See Comments)    unknown  . Iodine  Other (See Comments)    Rash and swelling    Outpatient Encounter Medications as of 11/23/2017  Medication Sig  . acetaminophen (TYLENOL) 500 MG tablet Take 500 mg by mouth every 4 (four) hours as needed for mild pain or headache.   Marland Kitchen. alum & mag hydroxide-simeth (MINTOX) 200-200-20 MG/5ML suspension Take 30 mLs by mouth every 6 (six) hours as needed for indigestion or heartburn (Not to exceed 4 doses in 24 hours).  Marland Kitchen. aspirin EC 81 MG tablet Take 81 mg by mouth daily.  Marland Kitchen. azelastine (OPTIVAR) 0.05 % ophthalmic solution Place 1 drop into both eyes 2 (two) times daily.   . carboxymethylcellulose (LUBRICANT EYE DROPS) 0.5 % SOLN Place 1 drop into  both eyes 4 (four) times daily.   . cholecalciferol (VITAMIN D) 1000 UNITS tablet Take 1,000 Units by mouth daily.  . Cranberry 450 MG TABS Take 1 tablet by mouth daily.  Marland Kitchen. latanoprost (XALATAN) 0.005 % ophthalmic solution Place 1 drop into both eyes at bedtime.   Marland Kitchen. lisinopril (PRINIVIL,ZESTRIL) 20 MG tablet Take 1 tablet (20 mg total) by mouth daily. Reported on 02/05/2016  . mirtazapine (REMERON) 15 MG tablet Take 15 mg by mouth at bedtime.  Marland Kitchen. NUTRITIONAL SUPPLEMENT LIQD House Supplement - Intake Magic cup (nutritional treat) 4 ounces with meals  . Nutritional Supplements (NUTRITIONAL SUPPLEMENT PO) House 2.0 - Med Pass - Give 120 cc by mouth 2 times daily   No facility-administered encounter medications on file as of 11/23/2017.     Review of Systems  Unable to perform ROS: Dementia    Immunization History  Administered Date(s) Administered  . PPD Test 04/05/2017  . Td 12/23/2013   Pertinent  Health Maintenance Due  Topic Date Due  . PNA vac Low Risk Adult (1 of 2 - PCV13) 03/29/2018 (Originally 01/31/1995)  . INFLUENZA VACCINE  06/28/2018 (Originally 06/21/2017)  . DEXA SCAN  Completed   Fall Risk  09/18/2017  Falls in the past year? No   Functional Status Survey:    Vitals:   11/23/17 1321  BP: (!) 126/54  Pulse: 69  Resp: 19  Temp: 98.6 F (37 C)  TempSrc: Oral  SpO2: 98%  Weight: 140 lb 1.6 oz (63.5 kg)  Height: 5\' 5"  (1.651 m)   Body mass index is 23.31 kg/m. Physical Exam  Constitutional: She appears well-developed.  Frail appearing in NAD. Sitting in w/c  HENT:  Mouth/Throat: Oropharynx is clear and moist. No oropharyngeal exudate.  MMM; no oral thrush  Eyes: Pupils are equal, round, and reactive to light. No scleral icterus.  Neck: Neck supple. Carotid bruit is not present. No tracheal deviation present.  Cardiovascular: Normal rate, regular rhythm and intact distal pulses. Exam reveals no gallop and no friction rub.  Murmur (1/6 SEM) heard. No LE edema  b/l. no calf TTP.   Pulmonary/Chest: Effort normal and breath sounds normal. No stridor. No respiratory distress. She has no wheezes. She has no rales.  Abdominal: Soft. Normal appearance and bowel sounds are normal. She exhibits no distension and no mass. There is no hepatomegaly. There is no tenderness. There is no rigidity, no rebound and no guarding. No hernia.  Musculoskeletal: She exhibits edema.  Lymphadenopathy:    She has no cervical adenopathy.  Neurological: She is alert.  Skin: Skin is warm and dry. No rash noted.  Psychiatric: She has a normal mood and affect. Her behavior is normal. Thought content is delusional. Cognition and memory are impaired.    Labs  reviewed: Recent Labs    03/23/17 1051 03/23/17 1056 10/27/17  NA 141 143 143  K 4.1 4.3 4.4  CL 110 107  --   CO2 25  --   --   GLUCOSE 104* 98  --   BUN 15 20 23*  CREATININE 1.02* 1.00 0.9  CALCIUM 9.5  --   --    Recent Labs    03/23/17 1051 10/27/17  AST 25 14  ALT 12* 5*  ALKPHOS 109 131*  BILITOT 1.0  --   PROT 6.7  --   ALBUMIN 3.7  --    Recent Labs    03/23/17 1051 03/23/17 1056 04/21/17 10/27/17  WBC 6.2  --  7.4 6.9  NEUTROABS 4.3  --  4 4  HGB 14.6 15.6* 14.7 13.3  HCT 44.5 46.0 44 41  MCV 89.0  --   --   --   PLT 217  --  280 283   Lab Results  Component Value Date   TSH 2.34 08/02/2011   Lab Results  Component Value Date   HGBA1C 5.3 03/24/2017   Lab Results  Component Value Date   CHOL 122 03/24/2017   HDL 35 (L) 03/24/2017   LDLCALC 73 03/24/2017   LDLDIRECT 114.6 08/02/2011   TRIG 72 03/24/2017   CHOLHDL 3.5 03/24/2017    Significant Diagnostic Results in last 30 days:  No results found.  Assessment/Plan   ICD-10-CM   1. Late onset Alzheimer's disease without behavioral disturbance G30.1    F02.80   2. Aphasia due to acute cerebrovascular accident (CVA) (HCC) I63.9    R47.01   3. Depression with anxiety F41.8   4. Essential hypertension, benign I10   5. PAF  (paroxysmal atrial fibrillation) (HCC) I48.0     Cont current meds as ordered  Cont nutritional supplements as ordered  PT/OT/ST as indicated  Will follow  Labs/tests ordered: none    Emiliana Blaize S. Ancil Linsey  [facility] [address] [place], [place] [address] [phone] Cell (Monday-Friday 8 AM - 5 PM) [phone] After 5 PM and follow prompts

## 2017-12-26 ENCOUNTER — Non-Acute Institutional Stay (SKILLED_NURSING_FACILITY): Payer: Medicare Other | Admitting: Adult Health

## 2017-12-26 ENCOUNTER — Encounter: Payer: Self-pay | Admitting: Adult Health

## 2017-12-26 DIAGNOSIS — I272 Pulmonary hypertension, unspecified: Secondary | ICD-10-CM | POA: Diagnosis not present

## 2017-12-26 DIAGNOSIS — G301 Alzheimer's disease with late onset: Secondary | ICD-10-CM | POA: Diagnosis not present

## 2017-12-26 DIAGNOSIS — F0280 Dementia in other diseases classified elsewhere without behavioral disturbance: Secondary | ICD-10-CM

## 2017-12-26 DIAGNOSIS — I482 Chronic atrial fibrillation, unspecified: Secondary | ICD-10-CM

## 2017-12-26 DIAGNOSIS — I10 Essential (primary) hypertension: Secondary | ICD-10-CM | POA: Diagnosis not present

## 2017-12-26 NOTE — Progress Notes (Signed)
[facility] Room Number: 110A Place of Service:  SNF (31)   CODE STATUS: dnr  Allergies  Allergen Reactions  . Codeine Other (See Comments)    unknown  . Iodine Other (See Comments)    Rash and swelling    Chief Complaint  Patient presents with  . Medical Management of Chronic Issues    Hypertension; pulmonary hypertension; afib; Alzheimer's disease    HPI:  She is a 84 year old long term resident of this facility being seen for the management of her chronic illnesses: hypertension; pulmonary hypertension; afib; Alzheimer's disease. She is unable to participate in the hpi or ros. There are no reports of behavioral issues; no reports of changes in weight; no indications of pain present. There are no nursing concerns at this time.   Past Medical History:  Diagnosis Date  . Anxiety   . Coronary artery disease   . Dementia   . Depression   . Dizziness   . Fall   . GERD (gastroesophageal reflux disease)   . Glaucoma   . HX: anticoagulation   . Hypertension   . Osteoarthritis   . Permanent atrial fibrillation (HCC)   . SSS (sick sinus syndrome) (HCC)   . Tachy-brady syndrome [facility])     Past Surgical History:  Procedure Laterality Date  . BREAST REDUCTION SURGERY    . CARDIOVASCULAR STRESS TEST  02/15/2007  . CHOLECYSTECTOMY    . GASTRIC BYPASS    . INSERT / REPLACE / REMOVE PACEMAKER  01/24/2008   DDD PACEMAKER IMPLANT  . PACEMAKER INSERTION    . PERMANENT PACEMAKER GENERATOR CHANGE N/A 01/29/2013   Procedure: PERMANENT PACEMAKER GENERATOR CHANGE;  Surgeon: Hillis Range, MD;  Location: [facility] CATH LAB;  Service: Cardiovascular;  Laterality: N/A;  . TOTAL KNEE ARTHROPLASTY     right  . US ECHOCARDIOGRAPHY  08/10/2007   EF 60%  . US ECHOCARDIOGRAPHY  01/12/2007   EF 70%    Social History   Socioeconomic History  . Marital status: Widowed    Spouse name: Not on file  . Number of children: 2  . Years of education: Not on file  .  Highest education level: Not on file  Social Needs  . Financial resource strain: Not on file  . Food insecurity - worry: Not on file  . Food insecurity - inability: Not on file  . Transportation needs - medical: Not on file  . Transportation needs - non-medical: Not on file  Occupational History  . Occupation: RETIRED    Employer: RETIRED  Tobacco Use  . Smoking status: Former Smoker    Last attempt to quit: 07/17/1961    Years since quitting: 56.4  . Smokeless tobacco: Never Used  Substance and Sexual Activity  . Alcohol use: No  . Drug use: No  . Sexual activity: No  Other Topics Concern  . Not on file  Social History Narrative  . Not on file   Family History  Problem Relation Age of Onset  . Kidney failure Mother   . Heart attack Father   . Heart attack Sister   . Heart attack Brother       VITAL SIGNS BP 122/69   Pulse 72   Temp 98.1 F (36.7 C)   Resp 16   Ht 5\' 5"  (1.651 m)   Wt 140 lb (63.5 kg)   SpO2 95%   BMI 23.30 kg/m   Outpatient Encounter Medications as of 12/26/2017  Medication Sig  . acetaminophen (TYLENOL) 500 MG tablet Take 500 mg by mouth every 4 (four) hours as needed for mild pain or headache.   Marland Kitchen alum & mag hydroxide-simeth (MINTOX) 200-200-20 MG/5ML suspension Take 30 mLs by mouth every 6 (six) hours as needed for indigestion or heartburn (Not to exceed 4 doses in 24 hours).  Marland Kitchen aspirin EC 81 MG tablet Take 81 mg by mouth daily.  Marland Kitchen azelastine (OPTIVAR) 0.05 % ophthalmic solution Place 1 drop into both eyes 2 (two) times daily.   . carboxymethylcellulose (LUBRICANT EYE DROPS) 0.5 % SOLN Place 1 drop into both eyes 4 (four) times daily.   . cholecalciferol (VITAMIN D) 1000 UNITS tablet Take 1,000 Units by mouth daily.  . Cranberry 450 MG TABS Take 1 tablet by mouth daily.  Marland Kitchen latanoprost (XALATAN) 0.005 % ophthalmic solution Place 1 drop into both eyes at bedtime.   Marland Kitchen lisinopril (PRINIVIL,ZESTRIL) 20 MG tablet Take 1 tablet (20 mg total) by mouth  daily. Reported on 02/05/2016  . mirtazapine (REMERON) 15 MG tablet Take 15 mg by mouth at bedtime.  Marland Kitchen NUTRITIONAL SUPPLEMENT LIQD House Supplement - Intake Magic cup (nutritional treat) 4 ounces with meals  . Nutritional Supplements (NUTRITIONAL SUPPLEMENT PO) House 2.0 - Med Pass - Give 120 cc by mouth 2 times daily   No facility-administered encounter medications on file as of 12/26/2017.      SIGNIFICANT DIAGNOSTIC EXAMS   PREVIOUS   03-23-17: chest x-ray: Mild enlargement of the cardiac silhouette without evidence of pulmonary vascular congestion or pulmonary edema. No acute pneumonia. Thoracic aortic atherosclerosis.   03-23-17: ct of head: No acute intracranial abnormality. Atrophy, chronic small vessel disease.  03-24-17: ct of head: Focal hypodensity/ gyral edema in the left posterior frontal lobe with extension into adjacent subcortical and periventricular white matter, suspicious for an area of infarct. There is no hemorrhage or significant mass effect. Atrophy with extensive white matter small vessel ischemic changes.   NO NEW EXAMS   LABS REVIEWED: PREVIOUS    03-23-17: wbc 6.2; hgb 14.6; hct 44.;5 mcv 89.0; plt 217; glucose 104; bun 15; creat 1.02; k+ 4.1 ;na++ 141; liver normal albumin 3.7 03-24-17: hgb a1 5.3; chol 122; ldl 73; trig 72; hdl 35  04-21-17: wbc 7.4 hgb 14.7; hct 43.8; mcv 90.8; plt 280  TODAY:   10-27-17: wbc 6.9; hgb 13.3; hct 40.6; mcv 88.9; plt 283; glucose 82; bun 22.7; creat 0.94; k+ 4.4; na++ 143; ca 9.3   Review of Systems  Unable to perform ROS: Dementia (unable to participate )   Physical Exam  Constitutional: She appears well-developed and well-nourished. No distress.  Eyes: Conjunctivae are normal.  Neck: No thyromegaly present.  Cardiovascular: Normal rate, regular rhythm and intact distal pulses.  Murmur heard. 1/6  Pulmonary/Chest: Effort normal and breath sounds normal. No respiratory distress.  Abdominal: Soft. Bowel sounds are normal. She  exhibits no distension. There is no tenderness.  Musculoskeletal: Normal range of motion. She exhibits no edema.  Lymphadenopathy:    She has no cervical adenopathy.  Neurological: She is alert.  Skin: Skin is warm and dry. She is not diaphoretic.  Wears geri-sleeves to arms   Psychiatric: She has a normal mood and affect.     ASSESSMENT/ PLAN:  TODAY:    1. Alzheimer's dementia, late onset without behavior disturbance: no significant change in status.  is currently not on medication; her current weight is 140 pounds   Will not make changes will monitor  2. Essential hypertension; benign: has pulmonary hypertension stable; b/p 122/69: will continue lisinopril 20 mg daily  3. Chronic atrial fibrillation: heart rate is stable; dose have a pacemaker will continue asa 81 mg daily    PREVIOUS   4. Chronic cerebrovascular accident: with aphasia due to acute cerebrovascular accident  Is stable  will continue asa 81 mg daily  Is not on statin due to her advanced age and dementia; will not make changes will monitor  5. Depression with anxiety: is emotionally stable:  will continue remeron 15 mg nightly she is off her seroquel and has tolerated without any difficulty; will monitor   6. Chronic allergic conjunctivitis: stable will continue optivar to both eyes twice daily   7. Glaucoma (increased eye pressure) is stable will continue xalatan to both eyes nightly     MD is aware of resident's narcotic use and is in agreement with current plan of care. We will attempt to wean resident as apropriate   Synthia Innocenteborah Alante Weimann NP [facility]  Contact [phone] Monday through Friday 8am- 5pm  After hours call [phone]

## 2018-01-22 ENCOUNTER — Encounter: Payer: Self-pay | Admitting: Adult Health

## 2018-01-22 ENCOUNTER — Non-Acute Institutional Stay (SKILLED_NURSING_FACILITY): Payer: Medicare Other | Admitting: Adult Health

## 2018-01-22 DIAGNOSIS — I639 Cerebral infarction, unspecified: Secondary | ICD-10-CM

## 2018-01-22 DIAGNOSIS — R4701 Aphasia: Secondary | ICD-10-CM | POA: Diagnosis not present

## 2018-01-22 DIAGNOSIS — I6930 Unspecified sequelae of cerebral infarction: Secondary | ICD-10-CM

## 2018-01-22 DIAGNOSIS — H409 Unspecified glaucoma: Secondary | ICD-10-CM | POA: Diagnosis not present

## 2018-01-22 DIAGNOSIS — H1045 Other chronic allergic conjunctivitis: Secondary | ICD-10-CM | POA: Diagnosis not present

## 2018-01-22 DIAGNOSIS — F418 Other specified anxiety disorders: Secondary | ICD-10-CM | POA: Diagnosis not present

## 2018-01-22 NOTE — Progress Notes (Signed)
Location:   [facility] Room Number: 110 A Place of Service:  SNF (31)   CODE STATUS: DNR  Allergies  Allergen Reactions  . Codeine Other (See Comments)    unknown  . Iodine Other (See Comments)    Rash and swelling    Chief Complaint  Patient presents with  . Medical Management of Chronic Issues    Cva; depression with anxiety; glaucoma; allergic conjunctivitis    HPI:  She is a 84 year old long term resident of this facility being seen for the management of her chronic illnesses: cva; depression with anxiety; glaucoma allergic conjunctivitis. She is unable to participate in the hpi or ros. There are no reports of changes in appetite; no behavioral issues; no reports of uncontrolled pain. There are no nursing concerns at this time.    Past Medical History:  Diagnosis Date  . Anxiety   . Coronary artery disease   . Dementia   . Depression   . Dizziness   . Fall   . GERD (gastroesophageal reflux disease)   . Glaucoma   . HX: anticoagulation   . Hypertension   . Osteoarthritis   . Permanent atrial fibrillation (HCC)   . SSS (sick sinus syndrome) (HCC)   . Tachy-brady syndrome [facility])     Past Surgical History:  Procedure Laterality Date  . BREAST REDUCTION SURGERY    . CARDIOVASCULAR STRESS TEST  02/15/2007  . CHOLECYSTECTOMY    . GASTRIC BYPASS    . INSERT / REPLACE / REMOVE PACEMAKER  01/24/2008   DDD PACEMAKER IMPLANT  . PACEMAKER INSERTION    . PERMANENT PACEMAKER GENERATOR CHANGE N/A 01/29/2013   Procedure: PERMANENT PACEMAKER GENERATOR CHANGE;  Surgeon: Hillis Range, MD;  Location: [facility] CATH LAB;  Service: Cardiovascular;  Laterality: N/A;  . TOTAL KNEE ARTHROPLASTY     right  . US ECHOCARDIOGRAPHY  08/10/2007   EF 60%  . US ECHOCARDIOGRAPHY  01/12/2007   EF 70%    Social History   Socioeconomic History  . Marital status: Widowed    Spouse name: Not on file  . Number of children: 2  . Years of education: Not on file  . Highest  education level: Not on file  Social Needs  . Financial resource strain: Not on file  . Food insecurity - worry: Not on file  . Food insecurity - inability: Not on file  . Transportation needs - medical: Not on file  . Transportation needs - non-medical: Not on file  Occupational History  . Occupation: RETIRED    Employer: RETIRED  Tobacco Use  . Smoking status: Former Smoker    Last attempt to quit: 07/17/1961    Years since quitting: 56.5  . Smokeless tobacco: Never Used  Substance and Sexual Activity  . Alcohol use: No  . Drug use: No  . Sexual activity: No  Other Topics Concern  . Not on file  Social History Narrative  . Not on file   Family History  Problem Relation Age of Onset  . Kidney failure Mother   . Heart attack Father   . Heart attack Sister   . Heart attack Brother       VITAL SIGNS BP 140/80   Pulse 68   Temp 97.9 F (36.6 C)   Resp 20   Ht 5\' 5"  (1.651 m)   Wt 140 lb 3.2 oz (63.6 kg)   SpO2 95%   BMI 23.33 kg/m   Outpatient Encounter Medications as  of 01/22/2018  Medication Sig  . acetaminophen (TYLENOL) 500 MG tablet Take 500 mg by mouth every 4 (four) hours as needed for mild pain or headache.   Marland Kitchen alum & mag hydroxide-simeth (MINTOX) 200-200-20 MG/5ML suspension Take 30 mLs by mouth every 6 (six) hours as needed for indigestion or heartburn (Not to exceed 4 doses in 24 hours).  Marland Kitchen aspirin EC 81 MG tablet Take 81 mg by mouth daily.  Marland Kitchen azelastine (OPTIVAR) 0.05 % ophthalmic solution Place 1 drop into both eyes 2 (two) times daily.   . carboxymethylcellulose (LUBRICANT EYE DROPS) 0.5 % SOLN Place 1 drop into both eyes 4 (four) times daily.   . cholecalciferol (VITAMIN D) 1000 UNITS tablet Take 1,000 Units by mouth daily.  . Cranberry 450 MG TABS Take 1 tablet by mouth daily.  Marland Kitchen ENSURE (ENSURE) Take 237 mLs by mouth 2 (two) times daily between meals.  . latanoprost (XALATAN) 0.005 % ophthalmic solution Place 1 drop into both eyes at bedtime.   Marland Kitchen  lisinopril (PRINIVIL,ZESTRIL) 20 MG tablet Take 1 tablet (20 mg total) by mouth daily. Reported on 02/05/2016  . loperamide (IMODIUM A-D) 2 MG tablet Take 2 mg by mouth every 4 (four) hours as needed for diarrhea or loose stools.  . mirtazapine (REMERON) 15 MG tablet Take 15 mg by mouth at bedtime.  Marland Kitchen NUTRITIONAL SUPPLEMENT LIQD House Supplement - Intake Magic cup (nutritional treat) 4 ounces with meals  . Nutritional Supplements (NUTRITIONAL SUPPLEMENT PO) House 2.0 - Med Pass - Give 120 cc by mouth 2 times daily  . Nutritional Supplements (NUTRITIONAL SUPPLEMENT PO) Take by mouth. Frozen Nutritional Treat with meals for weight variables   No facility-administered encounter medications on file as of 01/22/2018.      SIGNIFICANT DIAGNOSTIC EXAMS  PREVIOUS   03-23-17: chest x-ray: Mild enlargement of the cardiac silhouette without evidence of pulmonary vascular congestion or pulmonary edema. No acute pneumonia. Thoracic aortic atherosclerosis.   03-23-17: ct of head: No acute intracranial abnormality. Atrophy, chronic small vessel disease.  03-24-17: ct of head: Focal hypodensity/ gyral edema in the left posterior frontal lobe with extension into adjacent subcortical and periventricular white matter, suspicious for an area of infarct. There is no hemorrhage or significant mass effect. Atrophy with extensive white matter small vessel ischemic changes.   NO NEW EXAMS   LABS REVIEWED: PREVIOUS    03-23-17: wbc 6.2; hgb 14.6; hct 44.;5 mcv 89.0; plt 217; glucose 104; bun 15; creat 1.02; k+ 4.1 ;na++ 141; liver normal albumin 3.7 03-24-17: hgb a1 5.3; chol 122; ldl 73; trig 72; hdl 35  04-21-17: wbc 7.4 hgb 14.7; hct 43.8; mcv 90.8; plt 280 10-27-17: wbc 6.9; hgb 13.3; hct 40.6; mcv 88.9; plt 283; glucose 82; bun 22.7; creat 0.94; k+ 4.4; na++ 143; ca 9.3  NO NEW LABS     Review of Systems  Unable to perform ROS: Dementia (unable to participate )    Physical Exam  Constitutional: She appears  well-developed and well-nourished. No distress.  Neck: No thyromegaly present.  Cardiovascular: Normal rate, regular rhythm and intact distal pulses.  Murmur heard. 1/6  Pulmonary/Chest: Effort normal and breath sounds normal. No respiratory distress.  Abdominal: Soft. Bowel sounds are normal. She exhibits no distension. There is no tenderness.  Musculoskeletal: Normal range of motion. She exhibits no edema.  Lymphadenopathy:    She has no cervical adenopathy.  Neurological: She is alert.  Skin: Skin is warm and dry. She is not diaphoretic.  Psychiatric: She  has a normal mood and affect.      ASSESSMENT/ PLAN:  TODAY:    1. Chronic cerebrovascular accident: with aphasia due to acute cerebrovascular accident  Is stable  will continue asa 81 mg daily  Is not on statin due to her advanced age and dementia; will not make changes will monitor  2. Depression with anxiety: is emotionally stable:  will continue remeron 15 mg nightly she is off her seroquel and has tolerated without any difficulty; will monitor   3. Chronic allergic conjunctivitis: stable will continue optivar to both eyes twice daily   4. Glaucoma (increased eye pressure) is stable will continue xalatan to both eyes nightly    PREVIOUS   5. Alzheimer's dementia, late onset without behavior disturbance: no significant change in status.  is currently not on medication; her current weight is 140 pounds   Will not make changes will monitor  6. Essential hypertension; benign: has pulmonary hypertension stable; b/p 140/80: will continue lisinopril 20 mg daily  7. Chronic atrial fibrillation: heart rate is stable; dose have a pacemaker will continue asa 81 mg daily   MD is aware of resident's narcotic use and is in agreement with current plan of care. We will attempt to wean resident as apropriate    Synthia Innocenteborah Caidyn Henricksen NP [facility]  Contact [phone] Monday through Friday 8am- 5pm  After hours call  [phone]

## 2018-02-05 ENCOUNTER — Non-Acute Institutional Stay (SKILLED_NURSING_FACILITY): Payer: Medicare Other | Admitting: Internal Medicine

## 2018-02-05 ENCOUNTER — Encounter: Payer: Self-pay | Admitting: Internal Medicine

## 2018-02-05 DIAGNOSIS — I480 Paroxysmal atrial fibrillation: Secondary | ICD-10-CM | POA: Diagnosis not present

## 2018-02-05 DIAGNOSIS — G301 Alzheimer's disease with late onset: Secondary | ICD-10-CM | POA: Diagnosis not present

## 2018-02-05 DIAGNOSIS — I10 Essential (primary) hypertension: Secondary | ICD-10-CM | POA: Diagnosis not present

## 2018-02-05 DIAGNOSIS — F0280 Dementia in other diseases classified elsewhere without behavioral disturbance: Secondary | ICD-10-CM

## 2018-02-05 DIAGNOSIS — R195 Other fecal abnormalities: Secondary | ICD-10-CM

## 2018-02-05 DIAGNOSIS — Z66 Do not resuscitate: Secondary | ICD-10-CM | POA: Diagnosis not present

## 2018-02-05 DIAGNOSIS — Z7189 Other specified counseling: Secondary | ICD-10-CM | POA: Diagnosis not present

## 2018-02-05 DIAGNOSIS — I6930 Unspecified sequelae of cerebral infarction: Secondary | ICD-10-CM | POA: Diagnosis not present

## 2018-02-05 NOTE — Progress Notes (Signed)
Patient ID: [facility], female   DOB: [birth year], 84 y.o.   MRN: [id number]  Location:  [facility] Room Number: 110 A Place of Service:  SNF (31) Provider:  DR May Manrique Verlee Rossetti, MD  Patient Care Team: Ron Parker, MD as PCP - General (Internal Medicine) Hillis Range, MD as Attending Physician (Cardiology)  Extended Emergency Contact Information Primary Emergency Contact: Sullivan,Judy Address: [address]          [place], [place] [address] [place] of [place] Home Phone: [phone] Mobile Phone: [phone] Relation: Daughter  Code Status:   DNR Goals of care: Advanced Directive information Advanced Directives 02/05/2018  Does Patient Have a Medical Advance Directive? Yes  Type of Estate agent of [place];Out of facility DNR (pink MOST or yellow form)  Does patient want to make changes to medical advance directive? No - Patient declined  Copy of Healthcare Power of Attorney in Chart? -  Would patient like information on creating a medical advance directive? -  Pre-existing out of facility DNR order (yellow form or pink MOST form) Yellow form placed in chart (order not valid for inpatient use);Pink MOST form placed in chart (order not valid for inpatient use)     Chief Complaint  Patient presents with  . Acute Visit    Care Plan Meeting    HPI:  Pt is a 84 y.o. female seen today for an acute visit for care plan meeting. Pt's daughter was present. Advanced directives extensively discussed. MOST form voided and new one completed to reflect change abx to use when determine limitation of abx when infection occurs. She will remain DNR code status, comfort measures, no IVFs. Medications discussed - STOP ASA AND VITAMIN D; change lisinopril 10 mg daily. Daughter c/a loose stools. We discussed potential tx options. She has agreed to imodium after 1st loose stool with max 6 tabs in 24 hrs.    Pt is a poor historian due to  dementia; hx obtained from chart and daughter.   Past Medical History:  Diagnosis Date  . Anxiety   . Coronary artery disease   . Dementia   . Depression   . Dizziness   . Fall   . GERD (gastroesophageal reflux disease)   . Glaucoma   . HX: anticoagulation   . Hypertension   . Osteoarthritis   . Permanent atrial fibrillation (HCC)   . SSS (sick sinus syndrome) (HCC)   . Tachy-brady syndrome [facility])    Past Surgical History:  Procedure Laterality Date  . BREAST REDUCTION SURGERY    . CARDIOVASCULAR STRESS TEST  02/15/2007  . CHOLECYSTECTOMY    . GASTRIC BYPASS    . INSERT / REPLACE / REMOVE PACEMAKER  01/24/2008   DDD PACEMAKER IMPLANT  . PACEMAKER INSERTION    . PERMANENT PACEMAKER GENERATOR CHANGE N/A 01/29/2013   Procedure: PERMANENT PACEMAKER GENERATOR CHANGE;  Surgeon: Hillis Range, MD;  Location: [facility] CATH LAB;  Service: Cardiovascular;  Laterality: N/A;  . TOTAL KNEE ARTHROPLASTY     right  . US ECHOCARDIOGRAPHY  08/10/2007   EF 60%  . US ECHOCARDIOGRAPHY  01/12/2007   EF 70%    Allergies  Allergen Reactions  . Codeine Other (See Comments)    unknown  . Iodine Other (See Comments)    Rash and swelling    Outpatient Encounter Medications as of 02/05/2018  Medication Sig  . acetaminophen (TYLENOL) 500 MG tablet Take 500 mg by mouth every 4 (four)  hours as needed for mild pain or headache.   Marland Kitchen. alum & mag hydroxide-simeth (MINTOX) 200-200-20 MG/5ML suspension Take 30 mLs by mouth every 6 (six) hours as needed for indigestion or heartburn (Not to exceed 4 doses in 24 hours).  Marland Kitchen. aspirin EC 81 MG tablet Take 81 mg by mouth daily.  Marland Kitchen. azelastine (OPTIVAR) 0.05 % ophthalmic solution Place 1 drop into both eyes 2 (two) times daily.   . carboxymethylcellulose (LUBRICANT EYE DROPS) 0.5 % SOLN Place 1 drop into both eyes 4 (four) times daily.   . cholecalciferol (VITAMIN D) 1000 UNITS tablet Take 1,000 Units by mouth daily.  . Cranberry 450 MG TABS Take 1 tablet by mouth daily.    Marland Kitchen. ENSURE (ENSURE) Take 237 mLs by mouth 2 (two) times daily between meals.  . latanoprost (XALATAN) 0.005 % ophthalmic solution Place 1 drop into both eyes at bedtime.   Marland Kitchen. lisinopril (PRINIVIL,ZESTRIL) 20 MG tablet Take 1 tablet (20 mg total) by mouth daily. Reported on 02/05/2016  . loperamide (IMODIUM A-D) 2 MG tablet Take 2 mg by mouth every 4 (four) hours as needed for diarrhea or loose stools.  . mirtazapine (REMERON) 15 MG tablet Take 15 mg by mouth at bedtime.  . Nutritional Supplements (NUTRITIONAL SUPPLEMENT PO) House 2.0 - Med Pass - Give 120 cc by mouth 2 times daily  . Nutritional Supplements (NUTRITIONAL SUPPLEMENT PO) Take by mouth. Frozen Nutritional Treat with meals for weight variables  . [DISCONTINUED] NUTRITIONAL SUPPLEMENT LIQD House Supplement - Intake Magic cup (nutritional treat) 4 ounces with meals   No facility-administered encounter medications on file as of 02/05/2018.     Review of Systems  Unable to perform ROS: Dementia    Immunization History  Administered Date(s) Administered  . PPD Test 04/05/2017  . Td 12/23/2013   Pertinent  Health Maintenance Due  Topic Date Due  . PNA vac Low Risk Adult (1 of 2 - PCV13) 03/29/2018 (Originally 01/31/1995)  . INFLUENZA VACCINE  06/28/2018 (Originally 06/21/2017)  . DEXA SCAN  Completed   Fall Risk  09/18/2017  Falls in the past year? No   Functional Status Survey:    Vitals:   02/05/18 1035  BP: 122/80  Pulse: 80  Resp: 16  Temp: 98.7 F (37.1 C)  SpO2: 97%  Weight: 140 lb 3.2 oz (63.6 kg)  Height: 5\' 5"  (1.651 m)   Body mass index is 23.33 kg/m. Physical Exam  Constitutional: She appears well-developed.  Frail appearing in NAD  Musculoskeletal: She exhibits edema.  Neurological: She is alert.  Skin: Skin is warm and dry. No rash noted.  Psychiatric: She has a normal mood and affect. Her behavior is normal.    Labs reviewed: Recent Labs    03/23/17 1051 03/23/17 1056 10/27/17  NA 141 143 143   K 4.1 4.3 4.4  CL 110 107  --   CO2 25  --   --   GLUCOSE 104* 98  --   BUN 15 20 23*  CREATININE 1.02* 1.00 0.9  CALCIUM 9.5  --   --    Recent Labs    03/23/17 1051 10/27/17  AST 25 14  ALT 12* 5*  ALKPHOS 109 131*  BILITOT 1.0  --   PROT 6.7  --   ALBUMIN 3.7  --    Recent Labs    03/23/17 1051 03/23/17 1056 04/21/17 10/27/17  WBC 6.2  --  7.4 6.9  NEUTROABS 4.3  --  4 4  HGB  14.6 15.6* 14.7 13.3  HCT 44.5 46.0 44 41  MCV 89.0  --   --   --   PLT 217  --  280 283   Lab Results  Component Value Date   TSH 2.34 08/02/2011   Lab Results  Component Value Date   HGBA1C 5.3 03/24/2017   Lab Results  Component Value Date   CHOL 122 03/24/2017   HDL 35 (L) 03/24/2017   LDLCALC 73 03/24/2017   LDLDIRECT 114.6 08/02/2011   TRIG 72 03/24/2017   CHOLHDL 3.5 03/24/2017    Significant Diagnostic Results in last 30 days:  No results found.  Assessment/Plan   ICD-10-CM   1. Advanced directives, counseling/discussion Z71.89   2. Late onset Alzheimer's disease without behavioral disturbance G30.1    F02.80   3. Loose stools R19.5   4. Essential hypertension, benign I10   5. PAF (paroxysmal atrial fibrillation) (HCC) I48.0   6. History of stroke with residual deficit I69.30   7. DNR (do not resuscitate) Z66     d/c ASA, Vit D; reduce lisinopril to 10mg  daily  ADD IMODIUM PRN LOOSE STOOL WITH MAX 6 TABS IN 24 HRS  Cont other meds as ordered  Advanced directives discussed - Update MOST form as per HPI; TIME SPENT 20 minutes  Nutritional supplements per facility protocol  PT/OT/ST as indicated  Will follow   Labs/tests ordered: none  Alison Dodson  [facility] [address] [place], [place] [address] [phone] Cell (Monday-Friday 8 AM - 5 PM) [phone] After 5 PM and follow prompts

## 2018-02-21 ENCOUNTER — Encounter: Payer: Self-pay | Admitting: Adult Health

## 2018-02-21 ENCOUNTER — Non-Acute Institutional Stay (SKILLED_NURSING_FACILITY): Payer: Medicare Other | Admitting: Adult Health

## 2018-02-21 DIAGNOSIS — J309 Allergic rhinitis, unspecified: Secondary | ICD-10-CM | POA: Diagnosis not present

## 2018-02-21 NOTE — Progress Notes (Signed)
Location:   [facility] Room Number: 110 A Place of Service:  SNF (31)   CODE STATUS: DNR  Allergies  Allergen Reactions  . Codeine Other (See Comments)    unknown  . Iodine Other (See Comments)    Rash and swelling    Chief Complaint  Patient presents with  . Acute Visit    Cough / Congestion    HPI:  Staff reports that she is having a loose nonproductive cough. There are no reports of fevers present; no reports of respiratory distress; shortness of breath or hypoxia. She is unable to participate in the phi or ros.    Past Medical History:  Diagnosis Date  . Anxiety   . Coronary artery disease   . Dementia   . Depression   . Dizziness   . Fall   . GERD (gastroesophageal reflux disease)   . Glaucoma   . HX: anticoagulation   . Hypertension   . Osteoarthritis   . Permanent atrial fibrillation (HCC)   . SSS (sick sinus syndrome) (HCC)   . Tachy-brady syndrome [facility])     Past Surgical History:  Procedure Laterality Date  . BREAST REDUCTION SURGERY    . CARDIOVASCULAR STRESS TEST  02/15/2007  . CHOLECYSTECTOMY    . GASTRIC BYPASS    . INSERT / REPLACE / REMOVE PACEMAKER  01/24/2008   DDD PACEMAKER IMPLANT  . PACEMAKER INSERTION    . PERMANENT PACEMAKER GENERATOR CHANGE N/A 01/29/2013   Procedure: PERMANENT PACEMAKER GENERATOR CHANGE;  Surgeon: Hillis Range, MD;  Location: [facility] CATH LAB;  Service: Cardiovascular;  Laterality: N/A;  . TOTAL KNEE ARTHROPLASTY     right  . US ECHOCARDIOGRAPHY  08/10/2007   EF 60%  . US ECHOCARDIOGRAPHY  01/12/2007   EF 70%    Social History   Socioeconomic History  . Marital status: Widowed    Spouse name: Not on file  . Number of children: 2  . Years of education: Not on file  . Highest education level: Not on file  Occupational History  . Occupation: RETIRED    Employer: RETIRED  Social Needs  . Financial resource strain: Not on file  . Food insecurity:    Worry: Not on file    Inability: Not on file    . Transportation needs:    Medical: Not on file    Non-medical: Not on file  Tobacco Use  . Smoking status: Former Smoker    Last attempt to quit: 07/17/1961    Years since quitting: 56.6  . Smokeless tobacco: Never Used  Substance and Sexual Activity  . Alcohol use: No  . Drug use: No  . Sexual activity: Never  Lifestyle  . Physical activity:    Days per week: Not on file    Minutes per session: Not on file  . Stress: Not on file  Relationships  . Social connections:    Talks on phone: Not on file    Gets together: Not on file    Attends religious service: Not on file    Active member of club or organization: Not on file    Attends meetings of clubs or organizations: Not on file    Relationship status: Not on file  . Intimate partner violence:    Fear of current or ex partner: Not on file    Emotionally abused: Not on file    Physically abused: Not on file    Forced sexual activity: Not on file  Other  Topics Concern  . Not on file  Social History Narrative  . Not on file   Family History  Problem Relation Age of Onset  . Kidney failure Mother   . Heart attack Father   . Heart attack Sister   . Heart attack Brother       VITAL SIGNS Ht 5\' 5"  (1.651 m)   Wt 140 lb 3.2 oz (63.6 kg)   BMI 23.33 kg/m   Outpatient Encounter Medications as of 02/21/2018  Medication Sig  . acetaminophen (TYLENOL) 500 MG tablet Take 500 mg by mouth every 4 (four) hours as needed for mild pain or headache.   Marland Kitchen alum & mag hydroxide-simeth (MINTOX) 200-200-20 MG/5ML suspension Take 30 mLs by mouth every 6 (six) hours as needed for indigestion or heartburn (Not to exceed 4 doses in 24 hours).  Marland Kitchen azelastine (OPTIVAR) 0.05 % ophthalmic solution Place 1 drop into both eyes 2 (two) times daily.   . carboxymethylcellulose (LUBRICANT EYE DROPS) 0.5 % SOLN Place 1 drop into both eyes 4 (four) times daily.   . Cranberry 450 MG TABS Take 1 tablet by mouth daily.  Marland Kitchen ENSURE (ENSURE) Take 237 mLs by  mouth 2 (two) times daily between meals.  . latanoprost (XALATAN) 0.005 % ophthalmic solution Place 1 drop into both eyes at bedtime.   Marland Kitchen loperamide (IMODIUM A-D) 2 MG tablet Take 2 mg by mouth every 4 (four) hours as needed for diarrhea or loose stools.  . Nutritional Supplements (NUTRITIONAL SUPPLEMENT PO) House 2.0 - Med Pass - Give 120 cc by mouth 2 times daily  . Nutritional Supplements (NUTRITIONAL SUPPLEMENT PO) Take by mouth. Frozen Nutritional Treat with meals for weight variables  . [DISCONTINUED] aspirin EC 81 MG tablet Take 81 mg by mouth daily.  . [DISCONTINUED] cholecalciferol (VITAMIN D) 1000 UNITS tablet Take 1,000 Units by mouth daily.  . [DISCONTINUED] lisinopril (PRINIVIL,ZESTRIL) 20 MG tablet Take 1 tablet (20 mg total) by mouth daily. Reported on 02/05/2016 (Patient not taking: Reported on 02/21/2018)  . [DISCONTINUED] mirtazapine (REMERON) 15 MG tablet Take 15 mg by mouth at bedtime.   No facility-administered encounter medications on file as of 02/21/2018.      SIGNIFICANT DIAGNOSTIC EXAMS  PREVIOUS   03-23-17: chest x-ray: Mild enlargement of the cardiac silhouette without evidence of pulmonary vascular congestion or pulmonary edema. No acute pneumonia. Thoracic aortic atherosclerosis.   03-23-17: ct of head: No acute intracranial abnormality. Atrophy, chronic small vessel disease.  03-24-17: ct of head: Focal hypodensity/ gyral edema in the left posterior frontal lobe with extension into adjacent subcortical and periventricular white matter, suspicious for an area of infarct. There is no hemorrhage or significant mass effect. Atrophy with extensive white matter small vessel ischemic changes.    TODAY:   02-19-18: chest x-ray: no acute cardiopulmonary process    LABS REVIEWED: PREVIOUS    03-23-17: wbc 6.2; hgb 14.6; hct 44.;5 mcv 89.0; plt 217; glucose 104; bun 15; creat 1.02; k+ 4.1 ;na++ 141; liver normal albumin 3.7 03-24-17: hgb a1 5.3; chol 122; ldl 73; trig 72; hdl  35  4-0-98: wbc 7.4 hgb 14.7; hct 43.8; mcv 90.8; plt 280 10-27-17: wbc 6.9; hgb 13.3; hct 40.6; mcv 88.9; plt 283; glucose 82; bun 22.7; creat 0.94; k+ 4.4; na++ 143; ca 9.3  NO NEW LABS     Review of Systems  Unable to perform ROS: Dementia (unable to participate )    Physical Exam  Constitutional: She appears well-developed and well-nourished. No distress.  Eyes: Conjunctivae are normal.  Neck: No thyromegaly present.  Cardiovascular: Normal rate, regular rhythm and intact distal pulses.  Murmur heard. 1/6  Pulmonary/Chest: Effort normal and breath sounds normal. No respiratory distress.  Abdominal: Soft. Bowel sounds are normal. She exhibits no distension. There is no tenderness.  Musculoskeletal: Normal range of motion. She exhibits no edema.  Lymphadenopathy:    She has no cervical adenopathy.  Neurological: She is alert.  Skin: Skin is warm and dry. She is not diaphoretic.  Psychiatric: She has a normal mood and affect.     ASSESSMENT/ PLAN:  TODAY:    1.  Cough more than likely due to allergies; will begin robitussin 20 cc every 6 hours for one week will monitor    MD is aware of resident's narcotic use and is in agreement with current plan of care. We will attempt to wean resident as apropriate   Synthia Innocenteborah Michiko Lineman NP [facility]  Contact [phone] Monday through Friday 8am- 5pm  After hours call [phone]

## 2018-02-22 ENCOUNTER — Non-Acute Institutional Stay (SKILLED_NURSING_FACILITY): Payer: Medicare Other | Admitting: Internal Medicine

## 2018-02-22 ENCOUNTER — Encounter: Payer: Self-pay | Admitting: Internal Medicine

## 2018-02-22 DIAGNOSIS — F418 Other specified anxiety disorders: Secondary | ICD-10-CM

## 2018-02-22 DIAGNOSIS — G301 Alzheimer's disease with late onset: Secondary | ICD-10-CM

## 2018-02-22 DIAGNOSIS — F0280 Dementia in other diseases classified elsewhere without behavioral disturbance: Secondary | ICD-10-CM

## 2018-02-22 DIAGNOSIS — J309 Allergic rhinitis, unspecified: Secondary | ICD-10-CM

## 2018-02-22 DIAGNOSIS — I10 Essential (primary) hypertension: Secondary | ICD-10-CM

## 2018-02-22 DIAGNOSIS — Z8673 Personal history of transient ischemic attack (TIA), and cerebral infarction without residual deficits: Secondary | ICD-10-CM | POA: Diagnosis not present

## 2018-02-22 DIAGNOSIS — I480 Paroxysmal atrial fibrillation: Secondary | ICD-10-CM

## 2018-02-22 DIAGNOSIS — J189 Pneumonia, unspecified organism: Secondary | ICD-10-CM

## 2018-02-22 NOTE — Progress Notes (Signed)
Patient ID: Alison Dodson, female   DOB: 02-21-1928, 84 y.o.   MRN: [id number]  Location:  [facility] Room Number: 110 A Place of Service:  SNF (31) Provider:  DR Alic Hilburn Verlee Rossetti, MD  Patient Care Team: Ron Parker, MD as PCP - General (Internal Medicine) Hillis Range, MD as Attending Physician (Cardiology)  Extended Emergency Contact Information Primary Emergency Contact: Sullivan,Judy Address: [address]          [place], [place] [address] [place] Home Phone: [phone] Mobile Phone: [phone] Relation: Daughter  Code Status:  DNR Goals of care: Advanced Directive information Advanced Directives 02/22/2018  Does Patient Have a Medical Advance Directive? Yes  Type of Estate agent of [place];Out of facility DNR (pink MOST or yellow form)  Does patient want to make changes to medical advance directive? No - Patient declined  Copy of Healthcare Power of Attorney in Chart? Yes  Would patient like information on creating a medical advance directive? -  Pre-existing out of facility DNR order (yellow form or pink MOST form) Yellow form placed in chart (order not valid for inpatient use);Pink MOST form placed in chart (order not valid for inpatient use)     Chief Complaint  Patient presents with  . Medical Management of Chronic Issues    1 month follow up    HPI:  Pt is a 84 y.o. female seen today for medical management of chronic diseases.  She reports not feeling well. No cough, f/c. She is a poor historian due to dementia. Hx obtained from chart. She gets nutritional supplements. She was seen on 02/20/18 for uri and started on prn robitussin. CXR earlier this week neg for acute process.  Hx CVA with aphasia  - stable on ASA 81 mg daily  Depression with anxiety - stable on remeron 15 mg nightly; mood stable off seroquel   Chronic allergic conjunctivitis - stable on optivar to both eyes twice daily    Glaucoma (increased eye pressure) - stable on xalatan to both eyes nightly   Alzheimer's dementia - late onset without behavior disturbance. Unchanged off medications. Weight stable. She gets nutritional supplements  HTN with hx pulmonary hypertension - stable on lisinopril 20 mg daily  PAF - rate controlled with pacer. She takes ASA daily.      Past Medical History:  Diagnosis Date  . Anxiety   . Coronary artery disease   . Dementia   . Depression   . Dizziness   . Fall   . GERD (gastroesophageal reflux disease)   . Glaucoma   . HX: anticoagulation   . Hypertension   . Osteoarthritis   . Permanent atrial fibrillation (HCC)   . SSS (sick sinus syndrome) (HCC)   . Tachy-brady syndrome [facility])    Past Surgical History:  Procedure Laterality Date  . BREAST REDUCTION SURGERY    . CARDIOVASCULAR STRESS TEST  02/15/2007  . CHOLECYSTECTOMY    . GASTRIC BYPASS    . INSERT / REPLACE / REMOVE PACEMAKER  01/24/2008   DDD PACEMAKER IMPLANT  . PACEMAKER INSERTION    . PERMANENT PACEMAKER GENERATOR CHANGE N/A 01/29/2013   Procedure: PERMANENT PACEMAKER GENERATOR CHANGE;  Surgeon: Hillis Range, MD;  Location: [facility] CATH LAB;  Service: Cardiovascular;  Laterality: N/A;  . TOTAL KNEE ARTHROPLASTY     right  . US ECHOCARDIOGRAPHY  08/10/2007   EF 60%  . US ECHOCARDIOGRAPHY  01/12/2007   EF 70%  Allergies  Allergen Reactions  . Codeine Other (See Comments)    unknown  . Iodine Other (See Comments)    Rash and swelling    Outpatient Encounter Medications as of 02/22/2018  Medication Sig  . acetaminophen (TYLENOL) 500 MG tablet Take 500 mg by mouth every 4 (four) hours as needed for mild pain or headache.   Marland Kitchen alum & mag hydroxide-simeth (MINTOX) 200-200-20 MG/5ML suspension Take 30 mLs by mouth every 6 (six) hours as needed for indigestion or heartburn (Not to exceed 4 doses in 24 hours).  Marland Kitchen azelastine (OPTIVAR) 0.05 % ophthalmic solution Place 1 drop into both eyes 2 (two) times  daily.   . carboxymethylcellulose (LUBRICANT EYE DROPS) 0.5 % SOLN Place 1 drop into both eyes 4 (four) times daily.   . Cranberry 450 MG TABS Take 1 tablet by mouth daily.  Marland Kitchen ENSURE (ENSURE) Take 237 mLs by mouth 2 (two) times daily between meals.  . latanoprost (XALATAN) 0.005 % ophthalmic solution Place 1 drop into both eyes at bedtime.   Marland Kitchen loperamide (IMODIUM A-D) 2 MG tablet Take 2 mg by mouth every 4 (four) hours as needed for diarrhea or loose stools.  . Nutritional Supplements (NUTRITIONAL SUPPLEMENT PO) House 2.0 - Med Pass - Give 120 cc by mouth 2 times daily  . Nutritional Supplements (NUTRITIONAL SUPPLEMENT PO) Take by mouth. Frozen Nutritional Treat with meals for weight variables   No facility-administered encounter medications on file as of 02/22/2018.     Review of Systems  Unable to perform ROS: Dementia    Immunization History  Administered Date(s) Administered  . PPD Test 04/05/2017  . Td 12/23/2013   Pertinent  Health Maintenance Due  Topic Date Due  . PNA vac Low Risk Adult (1 of 2 - PCV13) 03/29/2018 (Originally 01/31/1995)  . INFLUENZA VACCINE  06/28/2018 (Originally 06/21/2018)  . DEXA SCAN  Completed   Fall Risk  09/18/2017  Falls in the past year? No   Functional Status Survey:    Vitals:   02/22/18 1336  BP: 110/74  Pulse: 78  Resp: 20  Temp: 98 F (36.7 C)  SpO2: 98%  Weight: 140 lb 3.2 oz (63.6 kg)  Height: 5\' 5"  (1.651 m)   Body mass index is 23.33 kg/m. Physical Exam  Constitutional: She appears well-developed.  Frail appearing; sitting up in bed in NAD  HENT:  Mouth/Throat: Oropharynx is clear and moist. No oropharyngeal exudate.  Poor dentition; MMM; no oral thrush  Eyes: Pupils are equal, round, and reactive to light. No scleral icterus.  Neck: Neck supple. Carotid bruit is not present. No tracheal deviation present. No thyromegaly present.  Cardiovascular: Normal rate, regular rhythm and intact distal pulses. Exam reveals no gallop  and no friction rub.  Murmur (1/6 SEM) heard. No LE edema b/l. No calf TTP. TED stockings intact  Pulmonary/Chest: Effort normal. No stridor. No respiratory distress. She has no wheezes. She has rhonchi (right with prolonged expirtaory phase). She has no rales. She exhibits no tenderness.  Left ACW pacer palpable  Abdominal: Soft. Normal appearance and bowel sounds are normal. She exhibits no distension and no mass. There is no hepatomegaly. There is no tenderness. There is no rigidity, no rebound and no guarding. No hernia.  Musculoskeletal: She exhibits edema.  Lymphadenopathy:    She has no cervical adenopathy.  Neurological: She is alert.  Skin: Skin is warm and dry. No rash noted.  Psychiatric: She has a normal mood and affect. Her behavior is  normal.    Labs reviewed: Recent Labs    03/23/17 1051 03/23/17 1056 10/27/17  NA 141 143 143  K 4.1 4.3 4.4  CL 110 107  --   CO2 25  --   --   GLUCOSE 104* 98  --   BUN 15 20 23*  CREATININE 1.02* 1.00 0.9  CALCIUM 9.5  --   --    Recent Labs    03/23/17 1051 10/27/17  AST 25 14  ALT 12* 5*  ALKPHOS 109 131*  BILITOT 1.0  --   PROT 6.7  --   ALBUMIN 3.7  --    Recent Labs    03/23/17 1051 03/23/17 1056 04/21/17 10/27/17  WBC 6.2  --  7.4 6.9  NEUTROABS 4.3  --  4 4  HGB 14.6 15.6* 14.7 13.3  HCT 44.5 46.0 44 41  MCV 89.0  --   --   --   PLT 217  --  280 283   Lab Results  Component Value Date   TSH 2.34 08/02/2011   Lab Results  Component Value Date   HGBA1C 5.3 03/24/2017   Lab Results  Component Value Date   CHOL 122 03/24/2017   HDL 35 (L) 03/24/2017   LDLCALC 73 03/24/2017   LDLDIRECT 114.6 08/02/2011   TRIG 72 03/24/2017   CHOLHDL 3.5 03/24/2017    Significant Diagnostic Results in last 30 days:  No results found.  Assessment/Plan   ICD-10-CM   1. HCAP (healthcare-associated pneumonia) J18.9   2. Allergic rhinitis, unspecified seasonality, unspecified trigger J30.9   3. Depression with  anxiety F41.8   4. Essential hypertension, benign I10   5. PAF (paroxysmal atrial fibrillation) (HCC) I48.0    s/p pacer  6. History of recent stroke Z86.73   7. Late onset Alzheimer's disease without behavioral disturbance G30.1    F02.80     START AUGMENTIN 875 BID X 10 DAYS; floraster po BID x 3 weeks  START DUONEB Q6HRS PRN SOB/W/COUGH X 10 DAYS  Cont other meds as ordered  PT/OT/ST as indicated  Cont robitussin as ordered  Cont nutritional supplements as ordered  Will follow  Labs/tests ordered: none   Lakyn Mantione S. Ancil Linseyarter, D. O., F. A. C. O. I.  [facility] [address] [place], [place] [address] [phone] Cell (Monday-Friday 8 AM - 5 PM) [phone] After 5 PM and follow prompts

## 2018-03-22 ENCOUNTER — Non-Acute Institutional Stay (SKILLED_NURSING_FACILITY): Payer: Medicare Other | Admitting: Adult Health

## 2018-03-22 ENCOUNTER — Encounter: Payer: Self-pay | Admitting: Adult Health

## 2018-03-22 DIAGNOSIS — Z950 Presence of cardiac pacemaker: Secondary | ICD-10-CM | POA: Diagnosis not present

## 2018-03-22 DIAGNOSIS — I482 Chronic atrial fibrillation, unspecified: Secondary | ICD-10-CM

## 2018-03-22 DIAGNOSIS — G301 Alzheimer's disease with late onset: Secondary | ICD-10-CM | POA: Diagnosis not present

## 2018-03-22 DIAGNOSIS — I6930 Unspecified sequelae of cerebral infarction: Secondary | ICD-10-CM

## 2018-03-22 DIAGNOSIS — F0280 Dementia in other diseases classified elsewhere without behavioral disturbance: Secondary | ICD-10-CM

## 2018-03-22 DIAGNOSIS — H1045 Other chronic allergic conjunctivitis: Secondary | ICD-10-CM | POA: Diagnosis not present

## 2018-03-22 DIAGNOSIS — H409 Unspecified glaucoma: Secondary | ICD-10-CM | POA: Diagnosis not present

## 2018-03-22 DIAGNOSIS — I10 Essential (primary) hypertension: Secondary | ICD-10-CM

## 2018-03-22 NOTE — Progress Notes (Signed)
Location:   [facility] Room Number: 110 A Place of Service:  SNF (31)   CODE STATUS: DNR  Allergies  Allergen Reactions  . Codeine Other (See Comments)    unknown  . Iodine Other (See Comments)    Rash and swelling    Chief Complaint  Patient presents with  . Annual Exam    CVA: hypertension; alzheimer's disease; chronic afib.     HPI:  She is a 84 year old long term resident of this facility being seen for her annual exam. She is unable to participate in the phi or ros. There are no reports of changes in appetite; no reports of uncontrolled pain; behavioral issues. Her daughter does not want any aggressive care including no abt and no lab work. There are no nursing concerns at this time.   Past Medical History:  Diagnosis Date  . Anxiety   . Coronary artery disease   . Dementia   . Depression   . Dizziness   . Fall   . GERD (gastroesophageal reflux disease)   . Glaucoma   . HX: anticoagulation   . Hypertension   . Osteoarthritis   . Permanent atrial fibrillation (HCC)   . SSS (sick sinus syndrome) (HCC)   . Tachy-brady syndrome [facility])     Past Surgical History:  Procedure Laterality Date  . BREAST REDUCTION SURGERY    . CARDIOVASCULAR STRESS TEST  02/15/2007  . CHOLECYSTECTOMY    . GASTRIC BYPASS    . INSERT / REPLACE / REMOVE PACEMAKER  01/24/2008   DDD PACEMAKER IMPLANT  . PACEMAKER INSERTION    . PERMANENT PACEMAKER GENERATOR CHANGE N/A 01/29/2013   Procedure: PERMANENT PACEMAKER GENERATOR CHANGE;  Surgeon: Hillis Range, MD;  Location: [facility] CATH LAB;  Service: Cardiovascular;  Laterality: N/A;  . TOTAL KNEE ARTHROPLASTY     right  . US ECHOCARDIOGRAPHY  08/10/2007   EF 60%  . US ECHOCARDIOGRAPHY  01/12/2007   EF 70%    Social History   Socioeconomic History  . Marital status: Widowed    Spouse name: Not on file  . Number of children: 2  . Years of education: Not on file  . Highest education level: Not on file  Occupational History    . Occupation: RETIRED    Employer: RETIRED  Social Needs  . Financial resource strain: Not on file  . Food insecurity:    Worry: Not on file    Inability: Not on file  . Transportation needs:    Medical: Not on file    Non-medical: Not on file  Tobacco Use  . Smoking status: Former Smoker    Last attempt to quit: 07/17/1961    Years since quitting: 56.7  . Smokeless tobacco: Never Used  Substance and Sexual Activity  . Alcohol use: No  . Drug use: No  . Sexual activity: Never  Lifestyle  . Physical activity:    Days per week: Not on file    Minutes per session: Not on file  . Stress: Not on file  Relationships  . Social connections:    Talks on phone: Not on file    Gets together: Not on file    Attends religious service: Not on file    Active member of club or organization: Not on file    Attends meetings of clubs or organizations: Not on file    Relationship status: Not on file  . Intimate partner violence:    Fear of current or  ex partner: Not on file    Emotionally abused: Not on file    Physically abused: Not on file    Forced sexual activity: Not on file  Other Topics Concern  . Not on file  Social History Narrative  . Not on file   Family History  Problem Relation Age of Onset  . Kidney failure Mother   . Heart attack Father   . Heart attack Sister   . Heart attack Brother       VITAL SIGNS BP 110/78   Pulse 76   Temp (!) 96 F (35.6 C)   Resp 18   Ht  (1.651 m)   Wt 138 lb (62.6 kg)   SpO2 96%   BMI 22.96 kg/m   Outpatient Encounter Medications as of 03/22/2018  Medication Sig  . acetaminophen (TYLENOL) 500 MG tablet Take 500 mg by mouth every 4 (four) hours as needed for mild pain or headache.   Marland Kitchen alum & mag hydroxide-simeth (MINTOX) 200-200-20 MG/5ML suspension Take 30 mLs by mouth every 6 (six) hours as needed for indigestion or heartburn (Not to exceed 4 doses in 24 hours).  Marland Kitchen azelastine (OPTIVAR) 0.05 % ophthalmic solution Place 1  drop into both eyes 2 (two) times daily.   . carboxymethylcellulose (LUBRICANT EYE DROPS) 0.5 % SOLN Place 1 drop into both eyes 4 (four) times daily.   . Cranberry 450 MG TABS Take 1 tablet by mouth daily.  Marland Kitchen ENSURE (ENSURE) Take 237 mLs by mouth 2 (two) times daily between meals.  . latanoprost (XALATAN) 0.005 % ophthalmic solution Place 1 drop into both eyes at bedtime.   Marland Kitchen loperamide (IMODIUM A-D) 2 MG tablet Take 2 mg by mouth every 4 (four) hours as needed for diarrhea or loose stools.  . Nutritional Supplements (NUTRITIONAL SUPPLEMENT PO) Take by mouth. Frozen Nutritional Treat with meals for weight variables  . [DISCONTINUED] Nutritional Supplements (NUTRITIONAL SUPPLEMENT PO) House 2.0 - Med Pass - Give 120 cc by mouth 2 times daily   No facility-administered encounter medications on file as of 03/22/2018.      SIGNIFICANT DIAGNOSTIC EXAMS  PREVIOUS   03-23-17: chest x-ray: Mild enlargement of the cardiac silhouette without evidence of pulmonary vascular congestion or pulmonary edema. No acute pneumonia. Thoracic aortic atherosclerosis.   03-23-17: ct of head: No acute intracranial abnormality. Atrophy, chronic small vessel disease.  03-24-17: ct of head: Focal hypodensity/ gyral edema in the left posterior frontal lobe with extension into adjacent subcortical and periventricular white matter, suspicious for an area of infarct. There is no hemorrhage or significant mass effect. Atrophy with extensive white matter small vessel ischemic changes.   02-19-18: chest x-ray: no acute cardiopulmonary process   NO NEW EXAMS    LABS REVIEWED: PREVIOUS    03-23-17: wbc 6.2; hgb 14.6; hct 44.;5 mcv 89.0; plt 217; glucose 104; bun 15; creat 1.02; k+ 4.1 ;na++ 141; liver normal albumin 3.7 03-24-17: hgb a1 5.3; chol 122; ldl 73; trig 72; hdl 35  04-21-17: wbc 7.4 hgb 14.7; hct 43.8; mcv 90.8; plt 280 10-27-17: wbc 6.9; hgb 13.3; hct 40.6; mcv 88.9; plt 283; glucose 82; bun 22.7; creat 0.94; k+ 4.4;  na++ 143; ca 9.3  NO NEW LABS     Review of Systems  Unable to perform ROS: Dementia (unable to participate )    Physical Exam  Constitutional: She appears well-developed and well-nourished. No distress.  Eyes: Conjunctivae are normal.  Neck: No thyromegaly present.  Cardiovascular: Normal rate,  regular rhythm and intact distal pulses.  Murmur heard. Pulmonary/Chest: Effort normal and breath sounds normal. No respiratory distress.  Abdominal: Soft. Bowel sounds are normal. She exhibits no distension. There is no tenderness.  Musculoskeletal: Normal range of motion. She exhibits no edema.  Lymphadenopathy:    She has no cervical adenopathy.  Neurological: She is alert.  Skin: Skin is warm. She is not diaphoretic.  Psychiatric: She has a normal mood and affect.     ASSESSMENT/ PLAN:  TODAY:    1. Chronic cerebrovascular accident: with aphasia due to acute cerebrovascular accident  Is stable  will continue to monitor  Is not on statin due to her advanced age and dementia; will not make changes will monitor  2. Depression with anxiety: is emotionally stable:  Is currently not on medications; will monitor   3. Chronic allergic conjunctivitis: stable will continue optivar to both eyes twice daily   4. Glaucoma (increased eye pressure) is stable will continue xalatan to both eyes nightly    5. Alzheimer's dementia, late onset without behavior disturbance: no significant change in status.  is currently not on medication; her current weight is 138 pounds   Will not make changes will monitor  6. Essential hypertension; benign: has pulmonary hypertension stable; b/p 110/78: will continue to moniotr   7. Chronic atrial fibrillation: heart rate is stable; dose have a pacemaker will continue to monitor   Her health care maintenance is up to date    MD is aware of resident's narcotic use and is in agreement with current plan of care. We will attempt to wean resident as apropriate    Synthia Innocent NP [facility]  Contact [phone] Monday through Friday 8am- 5pm  After hours call [phone]

## 2018-04-24 ENCOUNTER — Non-Acute Institutional Stay (SKILLED_NURSING_FACILITY): Payer: Medicare Other | Admitting: Adult Health

## 2018-04-24 ENCOUNTER — Encounter: Payer: Self-pay | Admitting: Adult Health

## 2018-04-24 DIAGNOSIS — I639 Cerebral infarction, unspecified: Secondary | ICD-10-CM | POA: Diagnosis not present

## 2018-04-24 DIAGNOSIS — R4701 Aphasia: Secondary | ICD-10-CM | POA: Diagnosis not present

## 2018-04-24 DIAGNOSIS — F418 Other specified anxiety disorders: Secondary | ICD-10-CM | POA: Diagnosis not present

## 2018-04-24 DIAGNOSIS — H1045 Other chronic allergic conjunctivitis: Secondary | ICD-10-CM | POA: Diagnosis not present

## 2018-04-24 DIAGNOSIS — I6930 Unspecified sequelae of cerebral infarction: Secondary | ICD-10-CM

## 2018-04-24 NOTE — Progress Notes (Signed)
Location:   [facility] Room Number: 110 A Place of Service:  SNF (31)   CODE STATUS: DNR  Allergies  Allergen Reactions  . Codeine Other (See Comments)    unknown  . Iodine Other (See Comments)    Rash and swelling    Chief Complaint  Patient presents with  . Medical Management of Chronic Issues    Cva; allergic conjunctivitis; depression    HPI:  She is a  84 year old long term resident of this facility being seen for the management of her chronic illnesses: cva; allergic conjunctivitis; and depression. She is unable to participate in the hpi or ros. There are no reports of changes in appetite; no uncontrolled pain; no eye irritation. There are no nursing concerns at this time.    Past Medical History:  Diagnosis Date  . Anxiety   . Coronary artery disease   . Dementia   . Depression   . Dizziness   . Fall   . GERD (gastroesophageal reflux disease)   . Glaucoma   . HX: anticoagulation   . Hypertension   . Osteoarthritis   . Permanent atrial fibrillation (HCC)   . SSS (sick sinus syndrome) (HCC)   . Tachy-brady syndrome [facility](HCC)     Past Surgical History:  Procedure Laterality Date  . BREAST REDUCTION SURGERY    . CARDIOVASCULAR STRESS TEST  02/15/2007  . CHOLECYSTECTOMY    . GASTRIC BYPASS    . INSERT / REPLACE / REMOVE PACEMAKER  01/24/2008   DDD PACEMAKER IMPLANT  . PACEMAKER INSERTION    . PERMANENT PACEMAKER GENERATOR CHANGE N/A 01/29/2013   Procedure: PERMANENT PACEMAKER GENERATOR CHANGE;  Surgeon: Hillis RangeJames Allred, MD;  [facility];  Service: Cardiovascular;  Laterality: N/A;  . TOTAL KNEE ARTHROPLASTY     right  . US ECHOCARDIOGRAPHY  08/10/2007   EF 60%  . US ECHOCARDIOGRAPHY  01/12/2007   EF 70%    Social History   Socioeconomic History  . Marital status: Widowed    Spouse name: Not on file  . Number of children: 2  . Years of education: Not on file  . Highest education level: Not on file  Occupational History  .  Occupation: RETIRED    Employer: RETIRED  Social Needs  . Financial resource strain: Not on file  . Food insecurity:    Worry: Not on file    Inability: Not on file  . Transportation needs:    Medical: Not on file    Non-medical: Not on file  Tobacco Use  . Smoking status: Former Smoker    Last attempt to quit: 07/17/1961    Years since quitting: 56.8  . Smokeless tobacco: Never Used  Substance and Sexual Activity  . Alcohol use: No  . Drug use: No  . Sexual activity: Never  Lifestyle  . Physical activity:    Days per week: Not on file    Minutes per session: Not on file  . Stress: Not on file  Relationships  . Social connections:    Talks on phone: Not on file    Gets together: Not on file    Attends religious service: Not on file    Active member of club or organization: Not on file    Attends meetings of clubs or organizations: Not on file    Relationship status: Not on file  . Intimate partner violence:    Fear of current or ex partner: Not on file  Emotionally abused: Not on file    Physically abused: Not on file    Forced sexual activity: Not on file  Other Topics Concern  . Not on file  Social History Narrative  . Not on file   Family History  Problem Relation Age of Onset  . Kidney failure Mother   . Heart attack Father   . Heart attack Sister   . Heart attack Brother       VITAL SIGNS BP 128/78   Pulse 81   Temp 97.9 F (36.6 C)   Resp 20   Ht 5\' 5"  (1.651 m)   Wt 137 lb 4.8 oz (62.3 kg)   SpO2 96%   BMI 22.85 kg/m   Outpatient Encounter Medications as of 04/24/2018  Medication Sig  . acetaminophen (TYLENOL) 500 MG tablet Take 500 mg by mouth every 4 (four) hours as needed for mild pain or headache.   Marland Kitchen alum & mag hydroxide-simeth (MINTOX) 200-200-20 MG/5ML suspension Take 30 mLs by mouth every 6 (six) hours as needed for indigestion or heartburn (Not to exceed 4 doses in 24 hours).  Marland Kitchen azelastine (OPTIVAR) 0.05 % ophthalmic solution Place 1  drop into both eyes 2 (two) times daily.   . carboxymethylcellulose (LUBRICANT EYE DROPS) 0.5 % SOLN Place 1 drop into both eyes 4 (four) times daily.   . Cranberry 450 MG TABS Take 1 tablet by mouth daily.  Marland Kitchen ENSURE (ENSURE) Take 237 mLs by mouth 2 (two) times daily between meals.  . latanoprost (XALATAN) 0.005 % ophthalmic solution Place 1 drop into both eyes at bedtime.   Marland Kitchen loperamide (IMODIUM A-D) 2 MG tablet Take 2 mg by mouth every 4 (four) hours as needed for diarrhea or loose stools.  . Nutritional Supplements (NUTRITIONAL SUPPLEMENT PO) Take by mouth. Frozen Nutritional Treat with meals for weight variables   No facility-administered encounter medications on file as of 04/24/2018.      SIGNIFICANT DIAGNOSTIC EXAMS  PREVIOUS   03-23-17: chest x-ray: Mild enlargement of the cardiac silhouette without evidence of pulmonary vascular congestion or pulmonary edema. No acute pneumonia. Thoracic aortic atherosclerosis.   03-23-17: ct of head: No acute intracranial abnormality. Atrophy, chronic small vessel disease.  03-24-17: ct of head: Focal hypodensity/ gyral edema in the left posterior frontal lobe with extension into adjacent subcortical and periventricular white matter, suspicious for an area of infarct. There is no hemorrhage or significant mass effect. Atrophy with extensive white matter small vessel ischemic changes.   02-19-18: chest x-ray: no acute cardiopulmonary process   NO NEW EXAMS    LABS REVIEWED: PREVIOUS: HER FAMILY DOES NOT WANT FURTHER LAB WORK     04-21-17: wbc 7.4 hgb 14.7; hct 43.8; mcv 90.8; plt 280 10-27-17: wbc 6.9; hgb 13.3; hct 40.6; mcv 88.9; plt 283; glucose 82; bun 22.7; creat 0.94; k+ 4.4; na++ 143; ca 9.3  NO NEW LABS     Review of Systems  Unable to perform ROS: Dementia (unable to participate )    Physical Exam  Constitutional: She appears well-developed and well-nourished. No distress.  Neck: No thyromegaly present.  Cardiovascular: Normal rate,  regular rhythm and intact distal pulses.  Murmur heard. 1/6  Pulmonary/Chest: Effort normal and breath sounds normal. No respiratory distress.  Abdominal: Soft. Bowel sounds are normal. She exhibits no distension. There is no tenderness.  Musculoskeletal: Normal range of motion. She exhibits no edema.  Lymphadenopathy:    She has no cervical adenopathy.  Neurological: She is alert.  Skin:  Skin is warm and dry. She is not diaphoretic.  Psychiatric: She has a normal mood and affect.     ASSESSMENT/ PLAN:  TODAY:    1. Chronic cerebrovascular accident: with aphasia due to acute cerebrovascular accident  Is stable  will continue to monitor  Is not on asa  statin due to her advanced age and dementia; will not make changes will monitor  2. Depression with anxiety: is emotionally stable:  Is currently not on medications; will monitor   3. Chronic allergic conjunctivitis: stable will continue optivar to both eyes twice daily   PREVIOUS  4. Glaucoma (increased eye pressure) is stable will continue xalatan to both eyes nightly    5. Alzheimer's dementia, late onset without behavior disturbance: no significant change in status.  is currently not on medication; her current weight is 137 pounds   Will not make changes will monitor  6. Essential hypertension; benign: has pulmonary hypertension stable; b/p 128/78: will continue to moniotr   7. Chronic atrial fibrillation: heart rate is stable; dose have a pacemaker will continue to monitor       MD is aware of resident's narcotic use and is in agreement with current plan of care. We will attempt to wean resident as apropriate   Synthia Innocent NP [facility]  Contact [phone] Monday through Friday 8am- 5pm  After hours call [phone]

## 2018-05-07 ENCOUNTER — Encounter: Payer: Self-pay | Admitting: Internal Medicine

## 2018-05-07 DIAGNOSIS — G301 Alzheimer's disease with late onset: Secondary | ICD-10-CM

## 2018-05-07 DIAGNOSIS — I6930 Unspecified sequelae of cerebral infarction: Secondary | ICD-10-CM | POA: Insufficient documentation

## 2018-05-07 DIAGNOSIS — Z66 Do not resuscitate: Secondary | ICD-10-CM | POA: Insufficient documentation

## 2018-05-07 DIAGNOSIS — I480 Paroxysmal atrial fibrillation: Secondary | ICD-10-CM | POA: Insufficient documentation

## 2018-05-07 DIAGNOSIS — F0280 Dementia in other diseases classified elsewhere without behavioral disturbance: Secondary | ICD-10-CM | POA: Insufficient documentation

## 2018-05-08 ENCOUNTER — Other Ambulatory Visit: Payer: Self-pay

## 2018-05-08 ENCOUNTER — Emergency Department (HOSPITAL_COMMUNITY): Payer: Medicare Other

## 2018-05-08 ENCOUNTER — Non-Acute Institutional Stay (SKILLED_NURSING_FACILITY): Payer: Medicare Other | Admitting: Adult Health

## 2018-05-08 ENCOUNTER — Encounter: Payer: Self-pay | Admitting: Adult Health

## 2018-05-08 ENCOUNTER — Encounter (HOSPITAL_COMMUNITY): Payer: Self-pay | Admitting: Internal Medicine

## 2018-05-08 ENCOUNTER — Emergency Department (HOSPITAL_COMMUNITY)
Admission: EM | Admit: 2018-05-08 | Discharge: 2018-05-08 | Disposition: A | Discharge status: Home / Self Care | Payer: Medicare Other | Attending: Emergency Medicine | Admitting: Emergency Medicine

## 2018-05-08 DIAGNOSIS — Z79899 Other long term (current) drug therapy: Secondary | ICD-10-CM | POA: Insufficient documentation

## 2018-05-08 DIAGNOSIS — S51011A Laceration without foreign body of right elbow, initial encounter: Secondary | ICD-10-CM

## 2018-05-08 DIAGNOSIS — I2510 Atherosclerotic heart disease of native coronary artery without angina pectoris: Secondary | ICD-10-CM | POA: Diagnosis not present

## 2018-05-08 DIAGNOSIS — F0280 Dementia in other diseases classified elsewhere without behavioral disturbance: Secondary | ICD-10-CM | POA: Diagnosis not present

## 2018-05-08 DIAGNOSIS — Z87891 Personal history of nicotine dependence: Secondary | ICD-10-CM | POA: Insufficient documentation

## 2018-05-08 DIAGNOSIS — S72011S Unspecified intracapsular fracture of right femur, sequela: Secondary | ICD-10-CM | POA: Diagnosis not present

## 2018-05-08 DIAGNOSIS — Z950 Presence of cardiac pacemaker: Secondary | ICD-10-CM | POA: Diagnosis not present

## 2018-05-08 DIAGNOSIS — S72002S Fracture of unspecified part of neck of left femur, sequela: Secondary | ICD-10-CM | POA: Diagnosis not present

## 2018-05-08 DIAGNOSIS — S72001A Fracture of unspecified part of neck of right femur, initial encounter for closed fracture: Secondary | ICD-10-CM

## 2018-05-08 DIAGNOSIS — Y999 Unspecified external cause status: Secondary | ICD-10-CM | POA: Diagnosis not present

## 2018-05-08 DIAGNOSIS — Y92122 Bedroom in nursing home as the place of occurrence of the external cause: Secondary | ICD-10-CM | POA: Insufficient documentation

## 2018-05-08 DIAGNOSIS — S72002A Fracture of unspecified part of neck of left femur, initial encounter for closed fracture: Secondary | ICD-10-CM | POA: Diagnosis not present

## 2018-05-08 DIAGNOSIS — W19XXXA Unspecified fall, initial encounter: Secondary | ICD-10-CM | POA: Diagnosis not present

## 2018-05-08 DIAGNOSIS — S81812A Laceration without foreign body, left lower leg, initial encounter: Secondary | ICD-10-CM

## 2018-05-08 DIAGNOSIS — I10 Essential (primary) hypertension: Secondary | ICD-10-CM | POA: Diagnosis not present

## 2018-05-08 DIAGNOSIS — Y939 Activity, unspecified: Secondary | ICD-10-CM | POA: Insufficient documentation

## 2018-05-08 DIAGNOSIS — G301 Alzheimer's disease with late onset: Secondary | ICD-10-CM | POA: Insufficient documentation

## 2018-05-08 DIAGNOSIS — Z96651 Presence of right artificial knee joint: Secondary | ICD-10-CM | POA: Insufficient documentation

## 2018-05-08 DIAGNOSIS — S79912A Unspecified injury of left hip, initial encounter: Secondary | ICD-10-CM | POA: Diagnosis present

## 2018-05-08 DIAGNOSIS — S01312A Laceration without foreign body of left ear, initial encounter: Secondary | ICD-10-CM | POA: Diagnosis not present

## 2018-05-08 LAB — CBC WITH DIFFERENTIAL/PLATELET
Abs Immature Granulocytes: 0.1 10*3/uL (ref 0.0–0.1)
BASOS ABS: 0.1 10*3/uL (ref 0.0–0.1)
BASOS PCT: 1 %
EOS ABS: 0.1 10*3/uL (ref 0.0–0.7)
EOS PCT: 2 %
HCT: 46.7 % — ABNORMAL HIGH (ref 36.0–46.0)
Hemoglobin: 14.9 g/dL (ref 12.0–15.0)
IMMATURE GRANULOCYTES: 1 %
LYMPHS ABS: 1.8 10*3/uL (ref 0.7–4.0)
Lymphocytes Relative: 20 %
MCH: 29 pg (ref 26.0–34.0)
MCHC: 31.9 g/dL (ref 30.0–36.0)
MCV: 90.9 fL (ref 78.0–100.0)
Monocytes Absolute: 0.6 10*3/uL (ref 0.1–1.0)
Monocytes Relative: 6 %
NEUTROS PCT: 70 %
Neutro Abs: 6.2 10*3/uL (ref 1.7–7.7)
PLATELETS: 233 10*3/uL (ref 150–400)
RBC: 5.14 MIL/uL — AB (ref 3.87–5.11)
RDW: 14.8 % (ref 11.5–15.5)
WBC: 8.8 10*3/uL (ref 4.0–10.5)

## 2018-05-08 LAB — BASIC METABOLIC PANEL
ANION GAP: 9 (ref 5–15)
BUN: 17 mg/dL (ref 6–20)
CALCIUM: 9.5 mg/dL (ref 8.9–10.3)
CO2: 25 mmol/L (ref 22–32)
CREATININE: 0.98 mg/dL (ref 0.44–1.00)
Chloride: 108 mmol/L (ref 101–111)
GFR, EST AFRICAN AMERICAN: 58 mL/min — AB (ref >60)
GFR, EST NON AFRICAN AMERICAN: 50 mL/min — AB (ref >60)
GLUCOSE: 87 mg/dL (ref 65–99)
Potassium: 3.7 mmol/L (ref 3.5–5.1)
Sodium: 142 mmol/L (ref 135–145)

## 2018-05-08 LAB — URINALYSIS, ROUTINE W REFLEX MICROSCOPIC
BILIRUBIN URINE: NEGATIVE
Glucose, UA: NEGATIVE mg/dL
Hgb urine dipstick: NEGATIVE
KETONES UR: NEGATIVE mg/dL
LEUKOCYTES UA: NEGATIVE
NITRITE: NEGATIVE
PH: 5 (ref 5.0–8.0)
PROTEIN: NEGATIVE mg/dL
Specific Gravity, Urine: 1.011 (ref 1.005–1.030)

## 2018-05-08 MED ORDER — HYDROCODONE-ACETAMINOPHEN 7.5-325 MG/15ML PO SOLN
10.0000 mL | Freq: Once | ORAL | Status: AC
  Administered 2018-05-08: 10 mL via ORAL
  Filled 2018-05-08: qty 15

## 2018-05-08 MED ORDER — LIDOCAINE-EPINEPHRINE-TETRACAINE (LET) SOLUTION
3.0000 mL | Freq: Once | NASAL | Status: AC
  Administered 2018-05-08: 3 mL via TOPICAL
  Filled 2018-05-08: qty 3

## 2018-05-08 MED ORDER — LIDOCAINE HCL URETHRAL/MUCOSAL 2 % EX GEL
1.0000 "application " | Freq: Once | CUTANEOUS | Status: DC
  Filled 2018-05-08: qty 20

## 2018-05-08 MED ORDER — TRAMADOL HCL 50 MG PO TABS: ORAL_TABLET | ORAL | 0 refills | Status: DC

## 2018-05-08 NOTE — ED Notes (Signed)
ED Provider at bedside. 

## 2018-05-08 NOTE — ED Notes (Addendum)
Bacitracin ointment applied to patient's left ear. Left ear wound, left knee wound, and right elbow wound cleaned with saline gauze and dressed with telfa dressing.

## 2018-05-08 NOTE — ED Notes (Addendum)
Pt transported to xray and CT

## 2018-05-08 NOTE — ED Notes (Signed)
Patient transported to X-ray 

## 2018-05-08 NOTE — Discharge Instructions (Addendum)
Nursing facility is aware of plan- will facilitate with hospice care for pain management, plan to switch mattress to help prevent future falls from bed. Sling transfer to wheelchair as able/comfortable.

## 2018-05-08 NOTE — ED Provider Notes (Signed)
[facility] EM[facility]GENCY [facility] Provider Note   CSN: [id number] Arrival date & time: 05/08/18  0708     History   Chief Complaint Chief Complaint  Patient presents with  . Fall    HPI Alison Dodson is a 84 y.o. female.  84yo female brought in by EMS from nursing facility for a fall, patient was found awake on the floor in her room this morning. Patient has a history of dementia, is reported to be at baseline mental status. Per EMS- skin tear to left knee, right elbow, left ear.      Past Medical History:  Diagnosis Date  . Anxiety   . Coronary artery disease   . Dementia   . Depression   . Dizziness   . Fall   . GERD (gastroesophageal reflux disease)   . Glaucoma   . HX: anticoagulation   . Hypertension   . Osteoarthritis   . Permanent atrial fibrillation (HCC)   . SSS (sick sinus syndrome) (HCC)   . Tachy-brady syndrome [facility](HCC)     Patient Active Problem List   Diagnosis Date Noted  . Late onset Alzheimer's disease without behavioral disturbance 05/07/2018  . PAF (paroxysmal atrial fibrillation) (HCC) 05/07/2018  . History of stroke with residual deficit 05/07/2018  . DNR (do not resuscitate) 05/07/2018  . Chronic allergic conjunctivitis 01/22/2018  . Chronic cerebrovascular accident (CVA) 08/13/2017  . 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 Chronic UTI 03/29/2017  . Depression with anxiety 03/29/2017  . Glaucoma (increased eye pressure) 03/29/2017  . Chronic atrial fibrillation (HCC)   . Pacemaker 02/05/2016  . Pulmonary HTN (HCC) 02/05/2016  . CAD (coronary artery disease) 05/19/2013  . Alzheimer's dementia 07/28/2012  . Essential hypertension, benign 02/02/2011    Past Surgical History:  Procedure Laterality Date  . BREAST REDUCTION SURGERY    . CARDIOVASCULAR STRESS TEST  02/15/2007  . CHOLECYSTECTOMY    . GASTRIC BYPASS    . INSERT / REPLACE / REMOVE PACEMAKER  01/24/2008   DDD PACEMAKER  IMPLANT  . PACEMAKER INSERTION    . PERMANENT PACEMAKER GENERATOR CHANGE N/A 01/29/2013   Procedure: PERMANENT PACEMAKER GENERATOR CHANGE;  Surgeon: Hillis RangeJames Allred, MD;  [facility];  Service: Cardiovascular;  Laterality: N/A;  . TOTAL KNEE ARTHROPLASTY     right  . US ECHOCARDIOGRAPHY  08/10/2007   EF 60%  . US ECHOCARDIOGRAPHY  01/12/2007   EF 70%     OB History   None      Home Medications    Prior to Admission medications   Medication Sig Start Date End Date Taking? Authorizing Provider  acetaminophen (TYLENOL) 500 MG tablet Take 500 mg by mouth every 4 (four) hours as needed for mild pain or headache.    Yes [provider]  alum & mag hydroxide-simeth (MINTOX) 200-200-20 MG/5ML suspension Take 30 mLs by mouth every 6 (six) hours as needed for indigestion or heartburn (Not to exceed 4 doses in 24 hours).   Yes [provider]  azelastine (OPTIVAR) 0.05 % ophthalmic solution Place 2 drops into both eyes 2 (two) times daily.    Yes [provider]  carboxymethylcellulose (LUBRICANT EYE DROPS) 0.5 % SOLN Place 1 drop into both eyes 4 (four) times daily.    Yes [provider]  Cranberry 450 MG TABS Take 1 tablet by mouth daily.   Yes [provider]  latanoprost (XALATAN) 0.005 % ophthalmic solution Place 1 drop into  both eyes at bedtime.    Yes [provider]    Family History Family History  Problem Relation Age of Onset  . Kidney failure Mother   . Heart attack Father   . Heart attack Sister   . Heart attack Brother     Social History Social History   Tobacco Use  . Smoking status: Former Smoker    Last attempt to quit: 07/17/1961    Years since quitting: 56.8  . Smokeless tobacco: Never Used  Substance Use Topics  . Alcohol use: No  . Drug use: No     Allergies   Codeine and Iodine   Review of Systems Review of Systems  Unable to perform ROS: Dementia     Physical Exam Updated Vital  Signs BP (!) 180/125   Pulse 91   Resp 20   SpO2 96%   Physical Exam  Constitutional: She appears well-developed and well-nourished.  HENT:  Head: Normocephalic.    Eyes: Pupils are equal, round, and reactive to light. Conjunctivae and EOM are normal.  Neck:  Towel roll in place for c-collar  Cardiovascular: A regularly irregular rhythm present.  Known a-fib  Pulmonary/Chest: Effort normal and breath sounds normal. No respiratory distress.  Abdominal: Soft. There is tenderness in the left lower quadrant.  Musculoskeletal:       Right elbow: She exhibits laceration. She exhibits normal range of motion. No tenderness found.       Right hip: Normal.       Left hip: She exhibits tenderness.       Left knee: She exhibits laceration. She exhibits normal range of motion and no swelling. No tenderness found.       Arms:      Legs: Neurological: She is alert.  Skin: Skin is warm and dry.  Nursing note and vitals reviewed.    ED Treatments / Results  Labs (all labs ordered are listed, but only abnormal results are displayed) Labs Reviewed  CBC WITH DIFFERENTIAL/PLATELET - Abnormal; Notable for the following components:      Result Value   RBC 5.14 (*)    HCT 46.7 (*)    All other components within normal limits  BASIC METABOLIC PANEL - Abnormal; Notable for the following components:   GFR calc non Af Amer 50 (*)    GFR calc Af Amer 58 (*)    All other components within normal limits  URINALYSIS, ROUTINE W REFLEX MICROSCOPIC    EKG EKG Interpretation  Date/Time:  Tuesday May 08 2018 07:20:21 EDT Ventricular Rate:  87 PR Interval:    QRS Duration: 89 QT Interval:  464 QTC Calculation: 546 R Axis:   -28 Text Interpretation:  Atrial fibrillation Borderline left axis deviation Nonspecific repol abnormality, diffuse leads Prolonged QT interval Baseline wander in lead(s) V2 Artifact Abnormal ekg Confirmed by Gerhard Munch [phone]) on 05/08/2018 7:42:47 AM   Radiology Ct  Head Wo Contrast  Result Date: 05/08/2018 CLINICAL DATA:  84 year old female post unwitnessed fall. Left ear skin tear. Dementia, atrial fibrillation and hypertension. Initial encounter. EXAM: CT HEAD WITHOUT CONTRAST CT CERVICAL SPINE WITHOUT CONTRAST TECHNIQUE: Multidetector CT imaging of the head and cervical spine was performed following the standard protocol without intravenous contrast. Multiplanar CT image reconstructions of the cervical spine were also generated. COMPARISON:  03/24/2017 head CT. 03/10/2016 head CT and cervical spine CT. FINDINGS: CT HEAD FINDINGS Brain: No intracranial hemorrhage or CT evidence of large acute infarct. Remote left frontal lobe infarct with encephalomalacia.  Remote small left cerebellar infarct. Prominent chronic microvascular changes. Moderate to marked global atrophy. No intracranial mass lesion noted on this unenhanced exam. Vascular: Vascular calcifications. Skull: No skull fracture Sinuses/Orbits: No acute orbital abnormality. Visualized paranasal sinuses are clear. Other: Mastoid air cells and middle ear cavities are clear. CT CERVICAL SPINE FINDINGS Alignment: Similar to prior exam. Skull base and vertebrae: No cervical spine fracture. Soft tissues and spinal canal: No abnormal prevertebral soft tissue swelling. Disc levels: Multilevel cervical spondylotic changes with facet degenerative changes greater on the right. Minimal change from prior exam. Upper chest: No worrisome apical lesion. Other: Carotid bifurcation calcifications. Pacemaker leads partially visualized. IMPRESSION: No skull fracture or intracranial hemorrhage. No cervical spine fracture or abnormal prevertebral soft tissue swelling. Alignment similar to prior exam. Chronic changes as detailed above. Electronically Signed   By: Lacy Duverney M.D.   On: 05/08/2018 08:17   Ct Cervical Spine Wo Contrast  Result Date: 05/08/2018 CLINICAL DATA:  84 year old female post unwitnessed fall. Left ear skin  tear. Dementia, atrial fibrillation and hypertension. Initial encounter. EXAM: CT HEAD WITHOUT CONTRAST CT CERVICAL SPINE WITHOUT CONTRAST TECHNIQUE: Multidetector CT imaging of the head and cervical spine was performed following the standard protocol without intravenous contrast. Multiplanar CT image reconstructions of the cervical spine were also generated. COMPARISON:  03/24/2017 head CT. 03/10/2016 head CT and cervical spine CT. FINDINGS: CT HEAD FINDINGS Brain: No intracranial hemorrhage or CT evidence of large acute infarct. Remote left frontal lobe infarct with encephalomalacia. Remote small left cerebellar infarct. Prominent chronic microvascular changes. Moderate to marked global atrophy. No intracranial mass lesion noted on this unenhanced exam. Vascular: Vascular calcifications. Skull: No skull fracture Sinuses/Orbits: No acute orbital abnormality. Visualized paranasal sinuses are clear. Other: Mastoid air cells and middle ear cavities are clear. CT CERVICAL SPINE FINDINGS Alignment: Similar to prior exam. Skull base and vertebrae: No cervical spine fracture. Soft tissues and spinal canal: No abnormal prevertebral soft tissue swelling. Disc levels: Multilevel cervical spondylotic changes with facet degenerative changes greater on the right. Minimal change from prior exam. Upper chest: No worrisome apical lesion. Other: Carotid bifurcation calcifications. Pacemaker leads partially visualized. IMPRESSION: No skull fracture or intracranial hemorrhage. No cervical spine fracture or abnormal prevertebral soft tissue swelling. Alignment similar to prior exam. Chronic changes as detailed above. Electronically Signed   By: Lacy Duverney M.D.   On: 05/08/2018 08:17   Dg Hip Unilat With Pelvis 2-3 Views Left  Result Date: 05/08/2018 CLINICAL DATA:  Fall, left hip pain EXAM: DG HIP (WITH OR WITHOUT PELVIS) 2-3V LEFT COMPARISON:  None. FINDINGS: There is a left femoral neck fracture. No significant displacement.  No subluxation or dislocation. Mild degenerative changes in the hips bilaterally. IMPRESSION: Left femoral neck fracture. Mild degenerative changes in the hips bilaterally. Electronically Signed   By: Charlett Nose M.D.   On: 05/08/2018 09:54   Dg Hip Unilat W Or Wo Pelvis 2-3 Views Right  Result Date: 05/08/2018 CLINICAL DATA:  Possible right hip fracture. EXAM: DG HIP (WITH OR WITHOUT PELVIS) 2-3V RIGHT COMPARISON:  Left hip series of today's date FINDINGS: There is a foreshortened appearance of the femoral neck consistent with a subcapital fracture with impaction. No intertrochanteric or sub trochanteric fracture is observed. There is moderate asymmetric joint space loss of the right hip. The right hemipelvis is grossly normal. IMPRESSION: Findings worrisome for an impacted subcapital fracture of the right hip. There is underlying moderate osteoarthritic joint space loss. Electronically Signed   By: Onalee Hua  [place] M.D.   On: 05/08/2018 10:45    Procedures Procedures (including critical care time)  Medications Ordered in ED Medications  HYDROcodone-acetaminophen (HYCET) 7.5-325 mg/15 ml solution 10 mL (has no administration in time range)  lidocaine-EPINEPHrine-tetracaine (LET) solution (3 mLs Topical Given 05/08/18 0925)     Initial Impression / Assessment and Plan / ED Course  I have reviewed the triage vital signs and the nursing notes.  Pertinent labs & imaging results that were available during my care of the patient were reviewed by me and considered in my medical decision making (see chart for details).  Clinical Course as of May 08 1153  Tue May 08, 2018  1109 Discussed care with patient's daughter, Liliane Channel, patient's POA, declines surgical intervention, requests comfort care only. Patient is not ambulatory, is bed/wheelchair bound, transfers via sling/carry. Patient is hypertensive, daughter acknowledges, states they have discontinued all medications other than a cranberry pill  for bladder.    [LM]  1139 Case discussed with patient's nursing facility who will facilitate hospice care, will address pain management upon arrival at nursing home, will change to perimeter mattress to help prevent future falls from bed.   [LM]  4052 84 year old female with history of Alzheimer's and dementia presents from nursing facility for fall today.  Patient was found lying on the floor by her bed.  On exam patient has pain with movement of her left hip, laceration to left ear antihelix, skin tear to right elbow, skin tear to left knee.  Patient is not on any medications, power of attorney as patient's daughter.  Per EMS patient is at baseline mental status.  CT head and C-spine with chronic changes, no acute injury.  X-ray left hip shows nondisplaced left femoral neck fracture, x-ray right hip concerning for impacted subcapital fracture of the right hip.  Lab work is unremarkable including normal white count and normal urinalysis.  Reviewed results with patient's daughter and power of attorney Liliane Channel.  Patient's daughter declines any surgical intervention and requests comfort care only.  Discussed with Dr. Jeraldine Loots who has consulted patient's facility, see note.   [LM]    Clinical Course User Index [LM] Jeannie Fend, PA-C    Final Clinical Impressions(s) / ED Diagnoses   Final diagnoses:  Fall, initial encounter  Closed fracture of left hip, initial encounter [facility])  Closed fracture of right hip, initial encounter (HCC)  Laceration of antihelix of left ear, initial encounter  Skin tear of right elbow without complication, initial encounter  Skin tear of left lower leg without complication, initial encounter    ED Discharge Orders    None       Alden Hipp 05/08/18 1154    Gerhard Munch, MD 05/09/18 2022

## 2018-05-08 NOTE — Progress Notes (Signed)
Location:   [facility] Room Number: 110 A Place of Service:  SNF (31)   CODE STATUS: DNR (Most form updated 03/29/17)  Allergies  Allergen Reactions  . Codeine Other (See Comments)    unknown  . Iodine Other (See Comments)    Rash and swelling    Chief Complaint  Patient presents with  . Acute Visit    ER Follow up    HPI:  She was taken to the ED after suffering a fall at this facility. She was found to have bilateral hip fractures. Her family has decided upon comfort care for her with hospice to be in place. She has several skin tears as well. She is unable to participate in the hpi or ros. She does have indications of pain present with palpation to her hips. She is lethargic; she is drinking fluids. Her daughter is at bedside and desires for her mother to be kept comfortable.    Past Medical History:  Diagnosis Date  . Anxiety   . Coronary artery disease   . Dementia   . Depression   . Dizziness   . Fall   . GERD (gastroesophageal reflux disease)   . Glaucoma   . HX: anticoagulation   . Hypertension   . Osteoarthritis   . Permanent atrial fibrillation (HCC)   . SSS (sick sinus syndrome) (HCC)   . Tachy-brady syndrome [facility])     Past Surgical History:  Procedure Laterality Date  . BREAST REDUCTION SURGERY    . CARDIOVASCULAR STRESS TEST  02/15/2007  . CHOLECYSTECTOMY    . GASTRIC BYPASS    . INSERT / REPLACE / REMOVE PACEMAKER  01/24/2008   DDD PACEMAKER IMPLANT  . PACEMAKER INSERTION    . PERMANENT PACEMAKER GENERATOR CHANGE N/A 01/29/2013   Procedure: PERMANENT PACEMAKER GENERATOR CHANGE;  Surgeon: Hillis Range, MD;  Location: [facility] CATH LAB;  Service: Cardiovascular;  Laterality: N/A;  . TOTAL KNEE ARTHROPLASTY     right  . US ECHOCARDIOGRAPHY  08/10/2007   EF 60%  . US ECHOCARDIOGRAPHY  01/12/2007   EF 70%    Social History   Socioeconomic History  . Marital status: Widowed    Spouse name: Not on file  . Number of children: 2  . Years  of education: Not on file  . Highest education level: Not on file  Occupational History  . Occupation: RETIRED    Employer: RETIRED  Social Needs  . Financial resource strain: Not on file  . Food insecurity:    Worry: Not on file    Inability: Not on file  . Transportation needs:    Medical: Not on file    Non-medical: Not on file  Tobacco Use  . Smoking status: Former Smoker    Last attempt to quit: 07/17/1961    Years since quitting: 56.8  . Smokeless tobacco: Never Used  Substance and Sexual Activity  . Alcohol use: No  . Drug use: No  . Sexual activity: Never  Lifestyle  . Physical activity:    Days per week: Not on file    Minutes per session: Not on file  . Stress: Not on file  Relationships  . Social connections:    Talks on phone: Not on file    Gets together: Not on file    Attends religious service: Not on file    Active member of club or organization: Not on file    Attends meetings of clubs or organizations: Not on file  Relationship status: Not on file  . Intimate partner violence:    Fear of current or ex partner: Not on file    Emotionally abused: Not on file    Physically abused: Not on file    Forced sexual activity: Not on file  Other Topics Concern  . Not on file  Social History Narrative  . Not on file   Family History  Problem Relation Age of Onset  . Kidney failure Mother   . Heart attack Father   . Heart attack Sister   . Heart attack Brother       VITAL SIGNS BP 110/62   Pulse 68   Temp 98 F (36.7 C)   Resp 18   Ht 5\' 5"  (1.651 m)   Wt 136 lb 4.8 oz (61.8 kg)   SpO2 98%   BMI 22.68 kg/m   Outpatient Encounter Medications as of 05/08/2018  Medication Sig  . acetaminophen (TYLENOL) 500 MG tablet Take 500 mg by mouth every 4 (four) hours as needed for mild pain or headache.   Marland Kitchen alum & mag hydroxide-simeth (MINTOX) 200-200-20 MG/5ML suspension Take 30 mLs by mouth every 6 (six) hours as needed for indigestion or heartburn (Not  to exceed 4 doses in 24 hours).  Marland Kitchen azelastine (OPTIVAR) 0.05 % ophthalmic solution Place 2 drops into both eyes 2 (two) times daily.   . carboxymethylcellulose (LUBRICANT EYE DROPS) 0.5 % SOLN Place 1 drop into both eyes 4 (four) times daily.   . Cranberry 450 MG TABS Take 1 tablet by mouth daily.  Marland Kitchen ENSURE (ENSURE) Take 237 mLs by mouth 2 (two) times daily between meals.  . latanoprost (XALATAN) 0.005 % ophthalmic solution Place 1 drop into both eyes at bedtime.   . Nutritional Supplements (NUTRITIONAL SUPPLEMENT PO) Frozen Nutritional treat with meals   No facility-administered encounter medications on file as of 05/08/2018.      SIGNIFICANT DIAGNOSTIC EXAMS   PREVIOUS   03-23-17: chest x-ray: Mild enlargement of the cardiac silhouette without evidence of pulmonary vascular congestion or pulmonary edema. No acute pneumonia. Thoracic aortic atherosclerosis.   03-23-17: ct of head: No acute intracranial abnormality. Atrophy, chronic small vessel disease.  03-24-17: ct of head: Focal hypodensity/ gyral edema in the left posterior frontal lobe with extension into adjacent subcortical and periventricular white matter, suspicious for an area of infarct. There is no hemorrhage or significant mass effect. Atrophy with extensive white matter small vessel ischemic changes.   02-19-18: chest x-ray: no acute cardiopulmonary process   TODAY;    05-08-18: ct of head and cervical spine: No skull fracture or intracranial hemorrhage. No cervical spine fracture or abnormal prevertebral soft tissue swelling. Alignment similar to prior exam. Chronic changes as detailed above.  05-08-18: left hip and pelvic x-ray: Left femoral neck fracture. Mild degenerative changes in the hips bilaterally.   05-08-18: right hip and pelvic x-ray: Findings worrisome for an impacted subcapital fracture of the right hip. There is underlying moderate osteoarthritic joint space loss.     LABS REVIEWED: PREVIOUS: HER FAMILY DOES  NOT WANT FURTHER LAB WORK     10-27-17: wbc 6.9; hgb 13.3; hct 40.6; mcv 88.9; plt 283; glucose 82; bun 22.7; creat 0.94; k+ 4.4; na++ 143; ca 9.3  TODAY:   05-08-18: wbc 8.8; hgb 14.9; hct 46.7; mcv 90.9; plt 233; glucose 87; bun 17; creat 0.98; k+ 3.7; na++ 142; ca 9.5    Review of Systems  Unable to perform ROS: Dementia (nonverbal )  Physical Exam  Constitutional: She appears well-developed and well-nourished. No distress.  Neck: No thyromegaly present.  Cardiovascular: Normal rate, regular rhythm and intact distal pulses.  Murmur heard. 1/6  Pulmonary/Chest: Effort normal and breath sounds normal. No respiratory distress.  Abdominal: Soft. Bowel sounds are normal. She exhibits no distension. There is no tenderness.  Musculoskeletal: She exhibits no edema.  Tenderness to palpation to bilateral hips   Lymphadenopathy:    She has no cervical adenopathy.  Neurological: She is alert.  Skin: Skin is warm and dry. She is not diaphoretic.  Psychiatric: She has a normal mood and affect.      ASSESSMENT/ PLAN:  TODAY:   1. Closed subcapital fracture of neck of right femur:  2. Closed fracture of neck of left femur:   Will have therapy see her for wheelchair positioning Will begin ultram 50 mg every 6 hours Will setup hospice consult.      MD is aware of resident's narcotic use and is in agreement with current plan of care. We will attempt to wean resident as apropriate   Synthia Innocenteborah Ashby Moskal NP [facility]  Contact [phone] Monday through Friday 8am- 5pm  After hours call [phone]

## 2018-05-08 NOTE — Telephone Encounter (Signed)
Rx faxed to [facility] (P) [phone], (F) [phone] 

## 2018-05-08 NOTE — ED Triage Notes (Addendum)
Pt here from [place] after an unwitnessed fall. Per EMS, pt fell out of bed upon waking this morning. Pt is not on blood thinners and was found on her side on the floor. Skin tear noted to left ear and right elbow. Bleeding controlled. Per EMS pt has hx of dementia and is at baseline cognitively.

## 2018-05-09 ENCOUNTER — Other Ambulatory Visit: Payer: Self-pay

## 2018-05-09 ENCOUNTER — Encounter: Payer: Self-pay | Admitting: Adult Health

## 2018-05-09 ENCOUNTER — Non-Acute Institutional Stay (SKILLED_NURSING_FACILITY): Payer: Medicare Other | Admitting: Adult Health

## 2018-05-09 DIAGNOSIS — S72011S Unspecified intracapsular fracture of right femur, sequela: Secondary | ICD-10-CM

## 2018-05-09 DIAGNOSIS — S72002S Fracture of unspecified part of neck of left femur, sequela: Secondary | ICD-10-CM

## 2018-05-09 DIAGNOSIS — F0280 Dementia in other diseases classified elsewhere without behavioral disturbance: Secondary | ICD-10-CM

## 2018-05-09 DIAGNOSIS — G301 Alzheimer's disease with late onset: Secondary | ICD-10-CM

## 2018-05-09 MED ORDER — MORPHINE SULFATE (CONCENTRATE) 20 MG/ML PO SOLN: ORAL | 0 refills | Status: DC

## 2018-05-09 NOTE — Progress Notes (Signed)
Location:   [facility] Room Number: 110 A Place of Service:  SNF (31)   CODE STATUS: DNR (Most form updated 03/29/17)  Allergies  Allergen Reactions  . Codeine Other (See Comments)    unknown  . Iodine Other (See Comments)    Rash and swelling    Chief Complaint  Patient presents with  . Acute Visit    Pain Management    HPI:  She has had a recent fall with bilateral hip fractures. She was started on ultram routinely without pain relief. Staff reports that she did yell out this morning in pain with movement. She is unable to participate in the hpi or ros. She appetite and fluid intake are both poor.   Past Medical History:  Diagnosis Date  . Anxiety   . Coronary artery disease   . Dementia   . Depression   . Dizziness   . Fall   . GERD (gastroesophageal reflux disease)   . Glaucoma   . HX: anticoagulation   . Hypertension   . Osteoarthritis   . Permanent atrial fibrillation (HCC)   . SSS (sick sinus syndrome) (HCC)   . Tachy-brady syndrome [facility](HCC)     Past Surgical History:  Procedure Laterality Date  . BREAST REDUCTION SURGERY    . CARDIOVASCULAR STRESS TEST  02/15/2007  . CHOLECYSTECTOMY    . GASTRIC BYPASS    . INSERT / REPLACE / REMOVE PACEMAKER  01/24/2008   DDD PACEMAKER IMPLANT  . PACEMAKER INSERTION    . PERMANENT PACEMAKER GENERATOR CHANGE N/A 01/29/2013   Procedure: PERMANENT PACEMAKER GENERATOR CHANGE;  Surgeon: Hillis RangeJames Allred, MD;  [facility];  Service: Cardiovascular;  Laterality: N/A;  . TOTAL KNEE ARTHROPLASTY     right  . US ECHOCARDIOGRAPHY  08/10/2007   EF 60%  . US ECHOCARDIOGRAPHY  01/12/2007   EF 70%    Social History   Socioeconomic History  . Marital status: Widowed    Spouse name: Not on file  . Number of children: 2  . Years of education: Not on file  . Highest education level: Not on file  Occupational History  . Occupation: RETIRED    Employer: RETIRED  Social Needs  . Financial resource strain:  Not on file  . Food insecurity:    Worry: Not on file    Inability: Not on file  . Transportation needs:    Medical: Not on file    Non-medical: Not on file  Tobacco Use  . Smoking status: Former Smoker    Last attempt to quit: 07/17/1961    Years since quitting: 56.8  . Smokeless tobacco: Never Used  Substance and Sexual Activity  . Alcohol use: No  . Drug use: No  . Sexual activity: Never  Lifestyle  . Physical activity:    Days per week: Not on file    Minutes per session: Not on file  . Stress: Not on file  Relationships  . Social connections:    Talks on phone: Not on file    Gets together: Not on file    Attends religious service: Not on file    Active member of club or organization: Not on file    Attends meetings of clubs or organizations: Not on file    Relationship status: Not on file  . Intimate partner violence:    Fear of current or ex partner: Not on file    Emotionally abused: Not on file    Physically abused:  Not on file    Forced sexual activity: Not on file  Other Topics Concern  . Not on file  Social History Narrative  . Not on file   Family History  Problem Relation Age of Onset  . Kidney failure Mother   . Heart attack Father   . Heart attack Sister   . Heart attack Brother       VITAL SIGNS BP 110/62   Pulse 68   Temp 98 F (36.7 C)   Resp 18   Ht 5\' 5"  (1.651 m)   Wt 136 lb 4.8 oz (61.8 kg)   SpO2 98%   BMI 22.68 kg/m   Outpatient Encounter Medications as of 05/09/2018  Medication Sig  . acetaminophen (TYLENOL) 500 MG tablet Take 500 mg by mouth every 4 (four) hours as needed for mild pain or headache.   Marland Kitchen alum & mag hydroxide-simeth (MINTOX) 200-200-20 MG/5ML suspension Take 30 mLs by mouth every 6 (six) hours as needed for indigestion or heartburn (Not to exceed 4 doses in 24 hours).  Marland Kitchen azelastine (OPTIVAR) 0.05 % ophthalmic solution Place 2 drops into both eyes 2 (two) times daily.   . carboxymethylcellulose (LUBRICANT EYE  DROPS) 0.5 % SOLN Place 1 drop into both eyes 4 (four) times daily.   . Cranberry 450 MG TABS Take 1 tablet by mouth daily.  Marland Kitchen ENSURE (ENSURE) Take 237 mLs by mouth 2 (two) times daily between meals.  . latanoprost (XALATAN) 0.005 % ophthalmic solution Place 1 drop into both eyes at bedtime.   . Nutritional Supplements (NUTRITIONAL SUPPLEMENT PO) Frozen Nutritional treat with meals  . traMADol (ULTRAM) 50 MG tablet Give 1 tablet by mouth every 6 hours routinely   No facility-administered encounter medications on file as of 05/09/2018.      SIGNIFICANT DIAGNOSTIC EXAMS   PREVIOUS   03-23-17: chest x-ray: Mild enlargement of the cardiac silhouette without evidence of pulmonary vascular congestion or pulmonary edema. No acute pneumonia. Thoracic aortic atherosclerosis.   03-23-17: ct of head: No acute intracranial abnormality. Atrophy, chronic small vessel disease.  03-24-17: ct of head: Focal hypodensity/ gyral edema in the left posterior frontal lobe with extension into adjacent subcortical and periventricular white matter, suspicious for an area of infarct. There is no hemorrhage or significant mass effect. Atrophy with extensive white matter small vessel ischemic changes.   02-19-18: chest x-ray: no acute cardiopulmonary process    05-08-18: ct of head and cervical spine: No skull fracture or intracranial hemorrhage. No cervical spine fracture or abnormal prevertebral soft tissue swelling. Alignment similar to prior exam. Chronic changes as detailed above.  05-08-18: left hip and pelvic x-ray: Left femoral neck fracture. Mild degenerative changes in the hips bilaterally.   05-08-18: right hip and pelvic x-ray: Findings worrisome for an impacted subcapital fracture of the right hip. There is underlying moderate osteoarthritic joint space loss.  NO NEW EXAMS.      LABS REVIEWED: PREVIOUS: HER FAMILY DOES NOT WANT FURTHER LAB WORK     10-27-17: wbc 6.9; hgb 13.3; hct 40.6; mcv 88.9; plt 283;  glucose 82; bun 22.7; creat 0.94; k+ 4.4; na++ 143; ca 9.3 05-08-18: wbc 8.8; hgb 14.9; hct 46.7; mcv 90.9; plt 233; glucose 87; bun 17; creat 0.98; k+ 3.7; na++ 142; ca 9.5   NO NEW LABS.    Review of Systems  Unable to perform ROS: Dementia (nonverbal )    Physical Exam  Constitutional: She appears well-developed and well-nourished. No distress.  Neck: No  thyromegaly present.  Cardiovascular: Normal rate, regular rhythm and intact distal pulses.  Murmur heard. 1/6  Pulmonary/Chest: Effort normal and breath sounds normal. No respiratory distress.  Abdominal: Soft. Bowel sounds are normal. She exhibits no distension. There is no tenderness.  Musculoskeletal: She exhibits no edema.  Has tenderness to palpation to both hips   Lymphadenopathy:    She has no cervical adenopathy.  Neurological:  Is aware   Skin: Skin is warm and dry. She is not diaphoretic.      ASSESSMENT/ PLAN:  TODAY:   1. Closed subcapital fracture of neck of right femur:  2. Closed fracture of neck of left femur: 3. Late onset alzheimer's disease without behavioral disturbance  Will stop the ultram Will begin roxanol 2.5 mg every 6 hours routinely and every 2 hours as needed      MD is aware of resident's narcotic use and is in agreement with current plan of care. We will attempt to wean resident as apropriate   Synthia Innocent NP [facility]  Contact [phone] Monday through Friday 8am- 5pm  After hours call [phone]

## 2018-05-09 NOTE — Telephone Encounter (Signed)
Rx faxed to [facility] (P) [phone], (F) [phone] 

## 2018-05-14 ENCOUNTER — Other Ambulatory Visit: Payer: Self-pay

## 2018-05-14 ENCOUNTER — Non-Acute Institutional Stay (SKILLED_NURSING_FACILITY): Payer: Medicare Other | Admitting: Adult Health

## 2018-05-14 ENCOUNTER — Encounter: Payer: Self-pay | Admitting: Adult Health

## 2018-05-14 DIAGNOSIS — S72002S Fracture of unspecified part of neck of left femur, sequela: Secondary | ICD-10-CM | POA: Diagnosis not present

## 2018-05-14 DIAGNOSIS — F0280 Dementia in other diseases classified elsewhere without behavioral disturbance: Secondary | ICD-10-CM | POA: Diagnosis not present

## 2018-05-14 DIAGNOSIS — S72011A Unspecified intracapsular fracture of right femur, initial encounter for closed fracture: Secondary | ICD-10-CM | POA: Insufficient documentation

## 2018-05-14 DIAGNOSIS — S72011S Unspecified intracapsular fracture of right femur, sequela: Secondary | ICD-10-CM

## 2018-05-14 DIAGNOSIS — S72002A Fracture of unspecified part of neck of left femur, initial encounter for closed fracture: Secondary | ICD-10-CM | POA: Insufficient documentation

## 2018-05-14 DIAGNOSIS — G301 Alzheimer's disease with late onset: Secondary | ICD-10-CM

## 2018-05-14 HISTORY — DX: Unspecified intracapsular fracture of right femur, initial encounter for closed fracture: S72.011A

## 2018-05-14 MED ORDER — MORPHINE SULFATE (CONCENTRATE) 20 MG/ML PO SOLN: ORAL | 0 refills | Status: AC

## 2018-05-14 MED ORDER — MORPHINE SULFATE (CONCENTRATE) 20 MG/ML PO SOLN: ORAL | 0 refills | Status: DC

## 2018-05-14 NOTE — Telephone Encounter (Signed)
Rx faxed to [facility] (P) [phone], (F) [phone] 

## 2018-05-14 NOTE — Progress Notes (Signed)
Location:   [facility] Room Number: 110 A Place of Service:  SNF (31)   CODE STATUS: (Most form updated 03/29/17)  Allergies  Allergen Reactions  . Codeine Other (See Comments)    unknown  . Iodine Other (See Comments)    Rash and swelling    Chief Complaint  Patient presents with  . Acute Visit    Change in status    HPI:  She is less responsive today. She will respond to tactile stimulation. There are no signs or indications of pain or distress present. She does have Chain stokes respirations present. She is not taking any thing by mouth. The staff is concerned about sedation.   Past Medical History:  Diagnosis Date  . Anxiety   . Coronary artery disease   . Dementia   . Depression   . Dizziness   . Fall   . GERD (gastroesophageal reflux disease)   . Glaucoma   . HX: anticoagulation   . Hypertension   . Osteoarthritis   . Permanent atrial fibrillation (HCC)   . SSS (sick sinus syndrome) (HCC)   . Tachy-brady syndrome [facility](HCC)     Past Surgical History:  Procedure Laterality Date  . BREAST REDUCTION SURGERY    . CARDIOVASCULAR STRESS TEST  02/15/2007  . CHOLECYSTECTOMY    . GASTRIC BYPASS    . INSERT / REPLACE / REMOVE PACEMAKER  01/24/2008   DDD PACEMAKER IMPLANT  . PACEMAKER INSERTION    . PERMANENT PACEMAKER GENERATOR CHANGE N/A 01/29/2013   Procedure: PERMANENT PACEMAKER GENERATOR CHANGE;  Surgeon: Hillis RangeJames Allred, MD;  [facility];  Service: Cardiovascular;  Laterality: N/A;  . TOTAL KNEE ARTHROPLASTY     right  . US ECHOCARDIOGRAPHY  08/10/2007   EF 60%  . US ECHOCARDIOGRAPHY  01/12/2007   EF 70%    Social History   Socioeconomic History  . Marital status: Widowed    Spouse name: Not on file  . Number of children: 2  . Years of education: Not on file  . Highest education level: Not on file  Occupational History  . Occupation: RETIRED    Employer: RETIRED  Social Needs  . Financial resource strain: Not on file  . Food  insecurity:    Worry: Not on file    Inability: Not on file  . Transportation needs:    Medical: Not on file    Non-medical: Not on file  Tobacco Use  . Smoking status: Former Smoker    Last attempt to quit: 07/17/1961    Years since quitting: 56.8  . Smokeless tobacco: Never Used  Substance and Sexual Activity  . Alcohol use: No  . Drug use: No  . Sexual activity: Never  Lifestyle  . Physical activity:    Days per week: Not on file    Minutes per session: Not on file  . Stress: Not on file  Relationships  . Social connections:    Talks on phone: Not on file    Gets together: Not on file    Attends religious service: Not on file    Active member of club or organization: Not on file    Attends meetings of clubs or organizations: Not on file    Relationship status: Not on file  . Intimate partner violence:    Fear of current or ex partner: Not on file    Emotionally abused: Not on file    Physically abused: Not on file    Forced  sexual activity: Not on file  Other Topics Concern  . Not on file  Social History Narrative  . Not on file   Family History  Problem Relation Age of Onset  . Kidney failure Mother   . Heart attack Father   . Heart attack Sister   . Heart attack Brother       VITAL SIGNS BP (!) 109/58   Pulse 70   Resp 18   Ht 5\' 5"  (1.651 m)   Wt 136 lb 4.8 oz (61.8 kg)   SpO2 98%   BMI 22.68 kg/m   Outpatient Encounter Medications as of 05/14/2018  Medication Sig  . acetaminophen (TYLENOL) 500 MG tablet Take 500 mg by mouth every 4 (four) hours as needed for mild pain or headache.   Marland Kitchen alum & mag hydroxide-simeth (MINTOX) 200-200-20 MG/5ML suspension Take 30 mLs by mouth every 6 (six) hours as needed for indigestion or heartburn (Not to exceed 4 doses in 24 hours).  Marland Kitchen azelastine (OPTIVAR) 0.05 % ophthalmic solution Place 2 drops into both eyes 2 (two) times daily.   . carboxymethylcellulose (LUBRICANT EYE DROPS) 0.5 % SOLN Place 1 drop into both eyes  4 (four) times daily.   . Cranberry 450 MG TABS Take 1 tablet by mouth daily.  Marland Kitchen ENSURE (ENSURE) Take 237 mLs by mouth 2 (two) times daily between meals.  . latanoprost (XALATAN) 0.005 % ophthalmic solution Place 1 drop into both eyes at bedtime.   Marland Kitchen morphine (ROXANOL) 20 MG/ML concentrated solution Give 5 mg by mouth every 6 hours routinely and every 2 hours as needed  . Nutritional Supplements (NUTRITIONAL SUPPLEMENT PO) Frozen Nutritional treat with meals  . [DISCONTINUED] traMADol (ULTRAM) 50 MG tablet Give 1 tablet by mouth every 6 hours routinely (Patient not taking: Reported on 05/14/2018)   No facility-administered encounter medications on file as of 05/14/2018.      SIGNIFICANT DIAGNOSTIC EXAMS  PREVIOUS   03-23-17: chest x-ray: Mild enlargement of the cardiac silhouette without evidence of pulmonary vascular congestion or pulmonary edema. No acute pneumonia. Thoracic aortic atherosclerosis.   03-23-17: ct of head: No acute intracranial abnormality. Atrophy, chronic small vessel disease.  03-24-17: ct of head: Focal hypodensity/ gyral edema in the left posterior frontal lobe with extension into adjacent subcortical and periventricular white matter, suspicious for an area of infarct. There is no hemorrhage or significant mass effect. Atrophy with extensive white matter small vessel ischemic changes.   02-19-18: chest x-ray: no acute cardiopulmonary process    05-08-18: ct of head and cervical spine: No skull fracture or intracranial hemorrhage. No cervical spine fracture or abnormal prevertebral soft tissue swelling. Alignment similar to prior exam. Chronic changes as detailed above.  05-08-18: left hip and pelvic x-ray: Left femoral neck fracture. Mild degenerative changes in the hips bilaterally.   05-08-18: right hip and pelvic x-ray: Findings worrisome for an impacted subcapital fracture of the right hip. There is underlying moderate osteoarthritic joint space loss.  NO NEW EXAMS.        LABS REVIEWED: PREVIOUS: HER FAMILY DOES NOT WANT FURTHER LAB WORK     10-27-17: wbc 6.9; hgb 13.3; hct 40.6; mcv 88.9; plt 283; glucose 82; bun 22.7; creat 0.94; k+ 4.4; na++ 143; ca 9.3 05-08-18: wbc 8.8; hgb 14.9; hct 46.7; mcv 90.9; plt 233; glucose 87; bun 17; creat 0.98; k+ 3.7; na++ 142; ca 9.5   NO NEW LABS.   Review of Systems  Unable to perform ROS: Patient unresponsive  Physical Exam  Constitutional: She appears well-developed and well-nourished. No distress.  Neck: No thyromegaly present.  Cardiovascular: Normal rate, regular rhythm and intact distal pulses.  Murmur heard. 1/6  Pulmonary/Chest: Breath sounds normal. No respiratory distress.  Chain stokes respirations   Abdominal: Soft. Bowel sounds are normal. She exhibits no distension.  Genitourinary: Vaginal discharge: is not moving extremities   Musculoskeletal: She exhibits no edema.  Lymphadenopathy:    She has no cervical adenopathy.  Neurological:  Not aware   Skin: Skin is warm and dry. She is not diaphoretic.    ASSESSMENT/ PLAN:  TODAY:   1. Closed subcapital fracture of neck of right femur:  2. Closed fracture of neck of left femur: 3. Late onset alzheimer's disease without behavioral disturbance  Will stop the routine dosing of roxnol will continue 5 mg every 2 hours as needed More than likely she has 24-48 hours of life remaining.        MD is aware of resident's narcotic use and is in agreement with current plan of care. We will attempt to wean resident as apropriate   Synthia Innocent NP [facility]  Contact [phone] Monday through Friday 8am- 5pm  After hours call [phone]

## 2018-05-17 ENCOUNTER — Encounter: Payer: Self-pay | Admitting: Adult Health

## 2018-05-21 DEATH — deceased

## 2018-06-21 DEATH — deceased
